# Patient Record
Sex: Male | Born: 1950
Health system: Southern US, Community
[De-identification: ages and names within clinical notes are randomized; demographics above are authoritative.]

## PROBLEM LIST (undated history)

## (undated) DIAGNOSIS — I4891 Unspecified atrial fibrillation: Secondary | ICD-10-CM

## (undated) DIAGNOSIS — N189 Chronic kidney disease, unspecified: Secondary | ICD-10-CM

## (undated) DIAGNOSIS — F32A Depression, unspecified: Secondary | ICD-10-CM

## (undated) DIAGNOSIS — C9590 Leukemia, unspecified not having achieved remission: Secondary | ICD-10-CM

## (undated) DIAGNOSIS — E039 Hypothyroidism, unspecified: Secondary | ICD-10-CM

## (undated) DIAGNOSIS — N400 Enlarged prostate without lower urinary tract symptoms: Secondary | ICD-10-CM

## (undated) DIAGNOSIS — I499 Cardiac arrhythmia, unspecified: Secondary | ICD-10-CM

## (undated) DIAGNOSIS — R0600 Dyspnea, unspecified: Secondary | ICD-10-CM

## (undated) DIAGNOSIS — Z5189 Encounter for other specified aftercare: Secondary | ICD-10-CM

## (undated) DIAGNOSIS — I219 Acute myocardial infarction, unspecified: Secondary | ICD-10-CM

## (undated) DIAGNOSIS — I10 Essential (primary) hypertension: Secondary | ICD-10-CM

## (undated) DIAGNOSIS — F419 Anxiety disorder, unspecified: Secondary | ICD-10-CM

## (undated) DIAGNOSIS — I509 Heart failure, unspecified: Secondary | ICD-10-CM

## (undated) DIAGNOSIS — I714 Abdominal aortic aneurysm, without rupture, unspecified: Secondary | ICD-10-CM

## (undated) DIAGNOSIS — I517 Cardiomegaly: Secondary | ICD-10-CM

## (undated) HISTORY — PX: KNEE SURGERY: SHX244

## (undated) HISTORY — DX: Hypothyroidism, unspecified: E03.9

## (undated) HISTORY — DX: Benign prostatic hyperplasia without lower urinary tract symptoms: N40.0

## (undated) HISTORY — DX: Abdominal aortic aneurysm, without rupture, unspecified: I71.40

## (undated) HISTORY — DX: Chronic kidney disease, unspecified: N18.9

## (undated) HISTORY — DX: Abdominal aortic aneurysm, without rupture: I71.4

## (undated) HISTORY — PX: APPENDECTOMY: SHX54

---

## 1998-02-24 ENCOUNTER — Encounter: Payer: Self-pay | Admitting: Internal Medicine

## 1998-02-24 ENCOUNTER — Emergency Department (HOSPITAL_COMMUNITY): Admission: EM | Admit: 1998-02-24 | Discharge: 1998-02-24 | Payer: Self-pay | Admitting: Emergency Medicine

## 2000-11-26 ENCOUNTER — Inpatient Hospital Stay (HOSPITAL_COMMUNITY): Admission: AD | Admit: 2000-11-26 | Discharge: 2000-11-29 | Payer: Self-pay | Admitting: Cardiology

## 2016-08-01 ENCOUNTER — Emergency Department (HOSPITAL_COMMUNITY)
Admission: EM | Admit: 2016-08-01 | Discharge: 2016-08-01 | Disposition: A | Discharge status: Home / Self Care | Payer: Self-pay | Attending: Emergency Medicine | Admitting: Emergency Medicine

## 2016-08-01 ENCOUNTER — Encounter (HOSPITAL_COMMUNITY): Payer: Self-pay

## 2016-08-01 DIAGNOSIS — Z7982 Long term (current) use of aspirin: Secondary | ICD-10-CM | POA: Insufficient documentation

## 2016-08-01 DIAGNOSIS — Z79899 Other long term (current) drug therapy: Secondary | ICD-10-CM | POA: Insufficient documentation

## 2016-08-01 DIAGNOSIS — I110 Hypertensive heart disease with heart failure: Secondary | ICD-10-CM | POA: Insufficient documentation

## 2016-08-01 DIAGNOSIS — R609 Edema, unspecified: Secondary | ICD-10-CM

## 2016-08-01 DIAGNOSIS — F17210 Nicotine dependence, cigarettes, uncomplicated: Secondary | ICD-10-CM | POA: Insufficient documentation

## 2016-08-01 DIAGNOSIS — I509 Heart failure, unspecified: Secondary | ICD-10-CM | POA: Insufficient documentation

## 2016-08-01 DIAGNOSIS — R600 Localized edema: Secondary | ICD-10-CM | POA: Insufficient documentation

## 2016-08-01 HISTORY — DX: Cardiomegaly: I51.7

## 2016-08-01 HISTORY — DX: Heart failure, unspecified: I50.9

## 2016-08-01 HISTORY — DX: Essential (primary) hypertension: I10

## 2016-08-01 LAB — I-STAT TROPONIN, ED: TROPONIN I, POC: 0.06 ng/mL (ref 0.00–0.08)

## 2016-08-01 LAB — BASIC METABOLIC PANEL
Anion gap: 9 (ref 5–15)
BUN: 33 mg/dL — ABNORMAL HIGH (ref 6–20)
CALCIUM: 9.1 mg/dL (ref 8.9–10.3)
CO2: 25 mmol/L (ref 22–32)
Chloride: 106 mmol/L (ref 101–111)
Creatinine, Ser: 1.33 mg/dL — ABNORMAL HIGH (ref 0.61–1.24)
GFR calc non Af Amer: 55 mL/min — ABNORMAL LOW (ref >60)
GLUCOSE: 133 mg/dL — AB (ref 65–99)
Potassium: 4.3 mmol/L (ref 3.5–5.1)
Sodium: 140 mmol/L (ref 135–145)

## 2016-08-01 LAB — CBC
HCT: 40.7 % (ref 39.0–52.0)
Hemoglobin: 12.3 g/dL — ABNORMAL LOW (ref 13.0–17.0)
MCH: 27.3 pg (ref 26.0–34.0)
MCHC: 30.2 g/dL (ref 30.0–36.0)
MCV: 90.2 fL (ref 78.0–100.0)
PLATELETS: 181 10*3/uL (ref 150–400)
RBC: 4.51 MIL/uL (ref 4.22–5.81)
RDW: 17 % — ABNORMAL HIGH (ref 11.5–15.5)
WBC: 12.3 10*3/uL — ABNORMAL HIGH (ref 4.0–10.5)

## 2016-08-01 NOTE — ED Notes (Signed)
Pt ambulated to bathroom 

## 2016-08-01 NOTE — ED Provider Notes (Signed)
[facility] DEPT Provider Note   CSN: [id number] Arrival date & time: 08/01/16  1347     History   Chief Complaint Chief Complaint  Patient presents with  . Leg Swelling    HPI John Hicks is a 66 y.o. male.  The history is provided by the patient.   CC: leg swelling  Onset/Duration: 1 week Timing: constant; worsening Location: both legs Quality: swelling Severity: moderate Modifying Factors:  Improved by: nothing  Worsened by: nothing Associated Signs/Symptoms:  Pertinent (+): nothing  Pertinent (-): chest pain, sob, redness, pain Context: h/o CHF. [facility] new [facility] for this, including Spironolactone.  Wanted to come get the medications checked out because he "doesn't trust the [place]."  Past Medical History:  Diagnosis Date  . CHF (congestive heart failure) ([place])   . Enlarged heart   . Hypertension     There are no active problems to display for this patient.   Past Surgical History:  Procedure Laterality Date  . APPENDECTOMY    . KNEE SURGERY         Home Medications    Prior to Admission medications   Medication Sig Start Date End Date Taking? Authorizing Provider  aspirin 81 MG chewable tablet Chew 81 mg by mouth daily.   Yes Historical Provider, MD  atorvastatin (LIPITOR) 80 MG tablet Take 80 mg by mouth daily.   Yes Historical Provider, MD  clopidogrel (PLAVIX) 75 MG tablet Take 75 mg by mouth daily.   Yes Historical Provider, MD  lisinopril (PRINIVIL,ZESTRIL) 40 MG tablet Take 20 mg by mouth daily.   Yes Historical Provider, MD  metoprolol (TOPROL-XL) 200 MG 24 hr tablet Take 200 mg by mouth daily.   Yes Historical Provider, MD  spironolactone (ALDACTONE) 25 MG tablet Take 12.5 mg by mouth daily.   Yes Historical Provider, MD  furosemide (LASIX) 40 MG tablet Take 40 mg by mouth daily.    Historical Provider, MD    Family History No family history on file.  Social History Social History  Substance Use  Topics  . Smoking status: Current Every Day Smoker    Packs/day: 0.10    Types: Cigarettes  . Smokeless tobacco: Never Used  . Alcohol use No     Allergies   Codeine   Review of Systems Review of Systems Ten systems are reviewed and are negative for acute change except as noted in the HPI   Physical Exam Updated Vital Signs BP 124/89   Pulse 102   Temp 98.5 F (36.9 C) (Oral)   Resp 17   Ht 6' (1.829 m)   Wt 250 lb (113.4 kg)   SpO2 99%   BMI 33.91 kg/m   Physical Exam  Constitutional: He is oriented to person, place, and time. He appears well-developed and well-nourished. No distress.  HENT:  Head: Normocephalic and atraumatic.  Nose: Nose normal.  Eyes: Conjunctivae and EOM are normal. Pupils are equal, round, and reactive to light. Right eye exhibits no discharge. Left eye exhibits no discharge. No scleral icterus.  Neck: Normal range of motion. Neck supple.  Cardiovascular: Normal rate and regular rhythm.  Exam reveals no gallop and no friction rub.   No murmur heard. Pulmonary/Chest: Effort normal and breath sounds normal. No stridor. No respiratory distress. He has no rales.  Abdominal: Soft. He exhibits no distension. There is no tenderness.  Musculoskeletal: He exhibits no edema or tenderness.  2+ BLE edema  Neurological: He is  alert and oriented to person, place, and time.  Skin: Skin is warm and dry. No rash noted. He is not diaphoretic. No erythema.  Psychiatric: He has a normal mood and affect.  Vitals reviewed.    ED Treatments / Results  Labs (all labs ordered are listed, but only abnormal results are displayed) Labs Reviewed  BASIC METABOLIC PANEL - Abnormal; Notable for the following:       Result Value   Glucose, Bld 133 (*)    BUN 33 (*)    Creatinine, Ser 1.33 (*)    GFR calc non Af Amer 55 (*)    All other components within normal limits  CBC - Abnormal; Notable for the following:    WBC 12.3 (*)    Hemoglobin 12.3 (*)    RDW 17.0  (*)    All other components within normal limits  I-STAT TROPOININ, ED    EKG  EKG Interpretation None       Radiology No results found.  Procedures Procedures (including critical care time)  Medications Ordered in ED Medications - No data to display   Initial Impression / Assessment and Plan / ED Course  I have reviewed the triage vital signs and the nursing notes.  Pertinent labs & imaging results that were available during my care of the patient were reviewed by me and considered in my medical decision making (see chart for details).     Peripheral edema in setting of known CHF recently started on Spironolactone which he has not taken yet. No chest pain or SOB or hypoxia that would require admission.  The patient is safe for discharge with strict return precautions.   Final Clinical Impressions(s) / ED Diagnoses   Final diagnoses:  Peripheral edema   Disposition: Discharge  Condition: Good  I have discussed the results, Dx and Tx plan with the patient who expressed understanding and agree(s) with the plan. Discharge instructions discussed at great length. The patient was given strict return precautions who verbalized understanding of the instructions. No further questions at time of discharge.    Current Discharge Medication List      Follow Up: Primary care provider  Schedule an appointment as soon as possible for a visit        Fatima Blank, MD 08/01/16 [phone]

## 2016-08-01 NOTE — ED Notes (Signed)
Pt stable, ambulatory, states understanding of discharge instructions 

## 2016-08-01 NOTE — ED Notes (Signed)
Pt stable, understands discharge instructions, and reasons for return.   

## 2016-08-01 NOTE — ED Triage Notes (Signed)
Per Pt, Pt is coming from home with complaints of bilateral leg swelling that started x 1 week. Pt was seen at the [facility] and given multiple medications, but he would like to have them reviewed and explained.

## 2016-08-12 ENCOUNTER — Inpatient Hospital Stay (HOSPITAL_COMMUNITY)
Admission: EM | Admit: 2016-08-12 | Discharge: 2016-08-23 | DRG: 291 | Disposition: A | Discharge status: Home Health | Payer: Self-pay | Attending: Oncology | Admitting: Oncology

## 2016-08-12 ENCOUNTER — Emergency Department (HOSPITAL_COMMUNITY): Payer: Self-pay

## 2016-08-12 ENCOUNTER — Encounter (HOSPITAL_COMMUNITY): Payer: Self-pay

## 2016-08-12 DIAGNOSIS — N189 Chronic kidney disease, unspecified: Secondary | ICD-10-CM | POA: Diagnosis present

## 2016-08-12 DIAGNOSIS — B372 Candidiasis of skin and nail: Secondary | ICD-10-CM | POA: Diagnosis present

## 2016-08-12 DIAGNOSIS — L304 Erythema intertrigo: Secondary | ICD-10-CM | POA: Diagnosis present

## 2016-08-12 DIAGNOSIS — I5043 Acute on chronic combined systolic (congestive) and diastolic (congestive) heart failure: Secondary | ICD-10-CM | POA: Diagnosis present

## 2016-08-12 DIAGNOSIS — Z1211 Encounter for screening for malignant neoplasm of colon: Secondary | ICD-10-CM

## 2016-08-12 DIAGNOSIS — Z79899 Other long term (current) drug therapy: Secondary | ICD-10-CM

## 2016-08-12 DIAGNOSIS — F419 Anxiety disorder, unspecified: Secondary | ICD-10-CM | POA: Diagnosis present

## 2016-08-12 DIAGNOSIS — N179 Acute kidney failure, unspecified: Secondary | ICD-10-CM | POA: Diagnosis present

## 2016-08-12 DIAGNOSIS — I483 Typical atrial flutter: Secondary | ICD-10-CM | POA: Diagnosis present

## 2016-08-12 DIAGNOSIS — Z7189 Other specified counseling: Secondary | ICD-10-CM

## 2016-08-12 DIAGNOSIS — I472 Ventricular tachycardia: Secondary | ICD-10-CM | POA: Diagnosis not present

## 2016-08-12 DIAGNOSIS — F17200 Nicotine dependence, unspecified, uncomplicated: Secondary | ICD-10-CM | POA: Diagnosis present

## 2016-08-12 DIAGNOSIS — B9620 Unspecified Escherichia coli [E. coli] as the cause of diseases classified elsewhere: Secondary | ICD-10-CM | POA: Diagnosis present

## 2016-08-12 DIAGNOSIS — I4510 Unspecified right bundle-branch block: Secondary | ICD-10-CM | POA: Diagnosis present

## 2016-08-12 DIAGNOSIS — Z7982 Long term (current) use of aspirin: Secondary | ICD-10-CM

## 2016-08-12 DIAGNOSIS — A0472 Enterocolitis due to Clostridium difficile, not specified as recurrent: Secondary | ICD-10-CM | POA: Diagnosis present

## 2016-08-12 DIAGNOSIS — N390 Urinary tract infection, site not specified: Secondary | ICD-10-CM | POA: Diagnosis present

## 2016-08-12 DIAGNOSIS — Z9114 Patient's other noncompliance with medication regimen: Secondary | ICD-10-CM

## 2016-08-12 DIAGNOSIS — I420 Dilated cardiomyopathy: Secondary | ICD-10-CM | POA: Diagnosis present

## 2016-08-12 DIAGNOSIS — I4729 Other ventricular tachycardia: Secondary | ICD-10-CM

## 2016-08-12 DIAGNOSIS — J449 Chronic obstructive pulmonary disease, unspecified: Secondary | ICD-10-CM | POA: Diagnosis present

## 2016-08-12 DIAGNOSIS — I428 Other cardiomyopathies: Secondary | ICD-10-CM | POA: Diagnosis present

## 2016-08-12 DIAGNOSIS — F17210 Nicotine dependence, cigarettes, uncomplicated: Secondary | ICD-10-CM | POA: Diagnosis present

## 2016-08-12 DIAGNOSIS — R570 Cardiogenic shock: Secondary | ICD-10-CM | POA: Diagnosis present

## 2016-08-12 DIAGNOSIS — I509 Heart failure, unspecified: Secondary | ICD-10-CM

## 2016-08-12 DIAGNOSIS — I130 Hypertensive heart and chronic kidney disease with heart failure and stage 1 through stage 4 chronic kidney disease, or unspecified chronic kidney disease: Principal | ICD-10-CM | POA: Diagnosis present

## 2016-08-12 DIAGNOSIS — Z515 Encounter for palliative care: Secondary | ICD-10-CM | POA: Diagnosis not present

## 2016-08-12 DIAGNOSIS — Q2543 Congenital aneurysm of aorta: Secondary | ICD-10-CM

## 2016-08-12 DIAGNOSIS — I480 Paroxysmal atrial fibrillation: Secondary | ICD-10-CM | POA: Diagnosis present

## 2016-08-12 DIAGNOSIS — I5082 Biventricular heart failure: Secondary | ICD-10-CM | POA: Diagnosis present

## 2016-08-12 DIAGNOSIS — R451 Restlessness and agitation: Secondary | ICD-10-CM | POA: Diagnosis not present

## 2016-08-12 DIAGNOSIS — R350 Frequency of micturition: Secondary | ICD-10-CM | POA: Diagnosis present

## 2016-08-12 DIAGNOSIS — I471 Supraventricular tachycardia: Secondary | ICD-10-CM | POA: Diagnosis present

## 2016-08-12 DIAGNOSIS — I10 Essential (primary) hypertension: Secondary | ICD-10-CM | POA: Diagnosis present

## 2016-08-12 DIAGNOSIS — Z8249 Family history of ischemic heart disease and other diseases of the circulatory system: Secondary | ICD-10-CM

## 2016-08-12 DIAGNOSIS — Z66 Do not resuscitate: Secondary | ICD-10-CM | POA: Diagnosis present

## 2016-08-12 DIAGNOSIS — I878 Other specified disorders of veins: Secondary | ICD-10-CM | POA: Diagnosis present

## 2016-08-12 LAB — CBC
HCT: 40.4 % (ref 39.0–52.0)
Hemoglobin: 12.8 g/dL — ABNORMAL LOW (ref 13.0–17.0)
MCH: 27.7 pg (ref 26.0–34.0)
MCHC: 31.7 g/dL (ref 30.0–36.0)
MCV: 87.4 fL (ref 78.0–100.0)
PLATELETS: 191 10*3/uL (ref 150–400)
RBC: 4.62 MIL/uL (ref 4.22–5.81)
RDW: 17.2 % — ABNORMAL HIGH (ref 11.5–15.5)
WBC: 9.5 10*3/uL (ref 4.0–10.5)

## 2016-08-12 LAB — BASIC METABOLIC PANEL
Anion gap: 9 (ref 5–15)
BUN: 24 mg/dL — ABNORMAL HIGH (ref 6–20)
CALCIUM: 9.2 mg/dL (ref 8.9–10.3)
CHLORIDE: 104 mmol/L (ref 101–111)
CO2: 23 mmol/L (ref 22–32)
CREATININE: 1.39 mg/dL — AB (ref 0.61–1.24)
GFR calc non Af Amer: 52 mL/min — ABNORMAL LOW (ref >60)
Glucose, Bld: 118 mg/dL — ABNORMAL HIGH (ref 65–99)
Potassium: 4.3 mmol/L (ref 3.5–5.1)
SODIUM: 136 mmol/L (ref 135–145)

## 2016-08-12 LAB — URINALYSIS, ROUTINE W REFLEX MICROSCOPIC
BILIRUBIN URINE: NEGATIVE
GLUCOSE, UA: NEGATIVE mg/dL
Ketones, ur: NEGATIVE mg/dL
Nitrite: POSITIVE — AB
PH: 5 (ref 5.0–8.0)
Protein, ur: 100 mg/dL — AB
Specific Gravity, Urine: 1.02 (ref 1.005–1.030)

## 2016-08-12 LAB — BRAIN NATRIURETIC PEPTIDE: B NATRIURETIC PEPTIDE 5: 2520 pg/mL — AB (ref 0.0–100.0)

## 2016-08-12 LAB — I-STAT TROPONIN, ED: TROPONIN I, POC: 0.06 ng/mL (ref 0.00–0.08)

## 2016-08-12 MED ORDER — FUROSEMIDE 10 MG/ML IJ SOLN
40.0000 mg | Freq: Once | INTRAMUSCULAR | Status: AC
  Administered 2016-08-12: 40 mg via INTRAVENOUS
  Filled 2016-08-12: qty 4

## 2016-08-12 MED ORDER — ONDANSETRON HCL 4 MG/2ML IJ SOLN
4.0000 mg | Freq: Four times a day (QID) | INTRAMUSCULAR | Status: DC | PRN
  Administered 2016-08-14: 4 mg via INTRAVENOUS
  Filled 2016-08-12: qty 2

## 2016-08-12 MED ORDER — SODIUM CHLORIDE 0.9% FLUSH: 3.0000 mL | INTRAVENOUS | Status: DC | PRN

## 2016-08-12 MED ORDER — SPIRONOLACTONE 25 MG PO TABS
12.5000 mg | ORAL_TABLET | Freq: Every day | ORAL | Status: DC
  Administered 2016-08-12 – 2016-08-16 (×5): 12.5 mg via ORAL
  Filled 2016-08-12 (×5): qty 1

## 2016-08-12 MED ORDER — ENOXAPARIN SODIUM 40 MG/0.4ML ~~LOC~~ SOLN
40.0000 mg | SUBCUTANEOUS | Status: DC
  Administered 2016-08-12 – 2016-08-13 (×2): 40 mg via SUBCUTANEOUS
  Filled 2016-08-12 (×2): qty 0.4

## 2016-08-12 MED ORDER — IPRATROPIUM-ALBUTEROL 0.5-2.5 (3) MG/3ML IN SOLN
3.0000 mL | Freq: Four times a day (QID) | RESPIRATORY_TRACT | Status: DC | PRN
Start: 2016-08-12 — End: 2016-08-23

## 2016-08-12 MED ORDER — SODIUM CHLORIDE 0.9% FLUSH
3.0000 mL | Freq: Two times a day (BID) | INTRAVENOUS | Status: DC
  Administered 2016-08-12 – 2016-08-23 (×18): 3 mL via INTRAVENOUS

## 2016-08-12 MED ORDER — DEXTROSE 5 % IV SOLN
1.0000 g | INTRAVENOUS | Status: DC
  Administered 2016-08-12 – 2016-08-14 (×3): 1 g via INTRAVENOUS
  Filled 2016-08-12 (×4): qty 10

## 2016-08-12 MED ORDER — SODIUM CHLORIDE 0.9 % IV SOLN: 250.0000 mL | INTRAVENOUS | Status: DC | PRN

## 2016-08-12 MED ORDER — FUROSEMIDE 10 MG/ML IJ SOLN
40.0000 mg | Freq: Every day | INTRAMUSCULAR | Status: DC
  Administered 2016-08-13: 40 mg via INTRAVENOUS
  Filled 2016-08-12: qty 4

## 2016-08-12 MED ORDER — ACETAMINOPHEN 325 MG PO TABS
650.0000 mg | ORAL_TABLET | ORAL | Status: DC | PRN
  Administered 2016-08-13 – 2016-08-14 (×2): 650 mg via ORAL
  Filled 2016-08-12 (×2): qty 2

## 2016-08-12 NOTE — ED Notes (Signed)
Report attempted 

## 2016-08-12 NOTE — ED Provider Notes (Signed)
[facility] DEPT Provider Note   CSN: [id number] Arrival date & time: 08/12/16  1343     History   Chief Complaint Chief Complaint  Patient presents with  . Leg Swelling    HPI John Hicks is a 66 y.o. male.  HPI  Patient presents with concern of bilateral leg swelling, dyspnea, worse with exertion. Patient acknowledges a history of congestive heart failure, gets his care weekly at the [place], though inconsistently, as he acknowledges some distrust [facility]. He notes over the past 3 weeks has had increasing bilateral lower extremity swelling, with new erythema over the past week, as well as with worsening dyspnea, with exertion. No focal chest pain, no chest pain with exertion. Patient takes spironolactone, as prescribed about one week ago, but has no Lasix. No concurrent fever, no syncope, no abdominal pain, no vomiting, no diarrhea.   Past Medical History:  Diagnosis Date  . CHF (congestive heart failure) ([place])   . Enlarged heart   . Hypertension     There are no active problems to display for this patient.   Past Surgical History:  Procedure Laterality Date  . APPENDECTOMY    . KNEE SURGERY         Home Medications    Prior to Admission medications   Medication Sig Start Date End Date Taking? Authorizing Provider  aspirin 81 MG chewable tablet Chew 81 mg by mouth daily.    Historical Provider, MD  atorvastatin (LIPITOR) 80 MG tablet Take 80 mg by mouth daily.    Historical Provider, MD  clopidogrel (PLAVIX) 75 MG tablet Take 75 mg by mouth daily.    Historical Provider, MD  furosemide (LASIX) 40 MG tablet Take 40 mg by mouth daily.    Historical Provider, MD  lisinopril (PRINIVIL,ZESTRIL) 40 MG tablet Take 20 mg by mouth daily.    Historical Provider, MD  metoprolol (TOPROL-XL) 200 MG 24 hr tablet Take 200 mg by mouth daily.    Historical Provider, MD  spironolactone (ALDACTONE) 25 MG tablet Take 12.5 mg by mouth daily.    Historical Provider, MD     Family History History reviewed. No pertinent family history.  Social History Social History  Substance Use Topics  . Smoking status: Current Every Day Smoker    Packs/day: 0.10    Types: Cigarettes  . Smokeless tobacco: Never Used  . Alcohol use No     Allergies   Codeine   Review of Systems Review of Systems  Constitutional:       Per HPI, otherwise negative  HENT:       Per HPI, otherwise negative  Respiratory:       Per HPI, otherwise negative  Cardiovascular:       Per HPI, otherwise negative  Gastrointestinal: Negative for vomiting.  Endocrine:       Negative aside from HPI  Genitourinary:       Neg aside from HPI   Musculoskeletal:       Per HPI, otherwise negative  Skin: Positive for color change.  Neurological: Positive for light-headedness. Negative for syncope.     Physical Exam Updated Vital Signs BP (!) 119/91   Pulse 94   Temp 97.3 F (36.3 C)   Resp 11   Ht 6' (1.829 m)   Wt 245 lb 4.8 oz (111.3 kg)   SpO2 100%   BMI 33.27 kg/m   Physical Exam  Constitutional: He is oriented to person, place, and time. He appears well-developed. No  distress.  HENT:  Head: Normocephalic and atraumatic.  Eyes: Conjunctivae and EOM are normal.  Cardiovascular: Normal rate and regular rhythm.   Pulmonary/Chest: No stridor. He has decreased breath sounds.  Abdominal: He exhibits no distension.  Musculoskeletal: He exhibits edema.  Distended bilateral lower extremities inferior to the knee, symmetrically, with erythematous, weeping skin  Neurological: He is alert and oriented to person, place, and time.  Skin: Skin is warm and dry.  Psychiatric: He has a normal mood and affect.  Nursing note and vitals reviewed.    ED Treatments / Results  Labs (all labs ordered are listed, but only abnormal results are displayed) Labs Reviewed  BASIC METABOLIC PANEL - Abnormal; Notable for the following:       Result Value   Glucose, Bld 118 (*)    BUN 24  (*)    Creatinine, Ser 1.39 (*)    GFR calc non Af Amer 52 (*)    All other components within normal limits  CBC - Abnormal; Notable for the following:    Hemoglobin 12.8 (*)    RDW 17.2 (*)    All other components within normal limits  BRAIN NATRIURETIC PEPTIDE - Abnormal; Notable for the following:    B Natriuretic Peptide 2,520.0 (*)    All other components within normal limits  URINALYSIS, ROUTINE W REFLEX MICROSCOPIC  I-STAT TROPOININ, ED    EKG  EKG Interpretation  Date/Time:  Saturday August 12 2016 14:17:38 EDT Ventricular Rate:  95 PR Interval:  198 QRS Duration: 180 QT Interval:  522 QTC Calculation: 655 R Axis:   -61 Text Interpretation:  Sinus rhythm with occasional Premature ventricular complexes Left axis deviation Right bundle branch block T wave abnormality No significant change since last tracing Abnormal ekg Confirmed by Carmin Muskrat  MD [phone]) on 08/12/2016 4:27:16 PM       Radiology Dg Chest 2 View  Result Date: 08/12/2016 CLINICAL DATA:  Patient with shortness of breath for multiple weeks. Lower extremity edema. EXAM: CHEST  2 VIEW COMPARISON:  None. FINDINGS: Marked cardiomegaly. Patchy opacities within the right mid and lower lung. Small right pleural effusion. No pneumothorax. Thoracic spine degenerative changes. IMPRESSION: Patchy heterogeneous opacities within the right mid and lower lung concerning for pneumonia in the appropriate clinical setting. Followup PA and lateral chest X-ray is recommended in 3-4 weeks following trial of antibiotic therapy to ensure resolution and exclude underlying malignancy. Small right pleural effusion. Marked cardiomegaly. A component of pericardial effusion is not excluded. Electronically Signed   By: [name] M.D.   On: 08/12/2016 14:59    Procedures Procedures (including critical care time)  Medications Ordered in ED Medications  furosemide (LASIX) injection 40 mg (not administered)     Initial Impression /  Assessment and Plan / ED Course  I have reviewed the triage vital signs and the nursing notes.  Pertinent labs & imaging results that were available during my care of the patient were reviewed by me and considered in my medical decision making (see chart for details).  Chart review notable for visit one week ago with similar circumstances, but now with worsening edema, compared to physical exam notes from that encounter.   Labs notable for BNP greater than 2500. Creatinine greater than 1.3. Patient will receive IV Lasix, 40 mg. Given the patient's persistent dyspnea, dyspnea on exertion, and substantial lower extremity edema, as well as abnormal chest x-ray, patient will require admission for further evaluation and management.  Final Clinical Impressions(s) /  ED Diagnoses  Acute exacerbation of congestive heart failure   Carmin Muskrat, MD 08/12/16 1728

## 2016-08-12 NOTE — Progress Notes (Signed)
Patient arrived in the unit accompanied by NT via stretcher. Orientation to the unit given. Patient verbalizes understanding. 

## 2016-08-12 NOTE — ED Notes (Signed)
Pt unable to give urine @ this time. Pt used restroom before being roomed.

## 2016-08-12 NOTE — ED Triage Notes (Signed)
Onset 3 weeks bilateral leg swelling.  Swelling is worsening, legs leaking fluid.  Edema up to hips per pt.  No chest pain.  Mild shortness of breath, NAD in triage.

## 2016-08-12 NOTE — H&P (Signed)
Date: 08/12/2016               Patient Name:  John Hicks MRN: [id number]  DOB: 02-Oct-1950 Age / Sex: 66 y.o., male   PCP: [facility]         Medical Service: Internal Medicine Teaching Service         Attending Physician: Dr. Sid Falcon, MD    First Contact: Dr. Einar Gip Pager: [phone]  Second Contact: Dr. Ignacia Marvel Pager: [phone]       After Hours (After 5p/  First Contact Pager: [phone]  weekends / holidays): Second Contact Pager: [phone]   Chief Complaint: leg swelling  History of Present Illness:  Mr. Nkosi Cortright is a 66 year old male with medical history significant for CHF, HTN, COPD with an 61 pack-year history who presents for evaluation of lower extremity swelling. Reports that about 3 weeks ago his legs started to swell and now are to the point where his legs are weeping and also developed scrotal edema. He was seen in the ED 10 days ago at which time he was noted to be volume overloaded and was discharged home assuming he would take his medications. He is prescribed lasix 40 mg at home and was recently started on Spironolactone. Last CHF exacerbation was around [place]. At this time he had a cath and echo however does not recall the findings however was told his vessels had not changed since his prior caths. He reports having at least 4 caths and several echos since he was diagnosed with HF in the late 90's. He denies any chest pain, worsening SOB, fevers, chills, recent illness nor abdominal pain.   In the ED, temp was 97.3*, pulse 98, BP 126/94, respirations 17 and he was saturating 100% on RA. CXR with small right pleural effusion and marked cardiomegaly. Patchy heterogenous opacities were appreciated BL. EKG shows sinus rhythm with rate of 95, RBB and borderline QT prolongation, unchanged from prior EKG 08/02/16. BMET with Cr of 1.4. BNP 2500. He was subsequently given IV lasix 40 mg and admitted for management of acute on chronic CHF.   Meds:  Current  Meds  Medication Sig  . ALPRAZolam (XANAX) 1 MG tablet Take 0.5 mg by mouth daily.  Marland Kitchen aspirin 81 MG chewable tablet Chew 81 mg by mouth daily.  Marland Kitchen lisinopril (PRINIVIL,ZESTRIL) 40 MG tablet Take 20 mg by mouth daily.  Marland Kitchen OVER THE COUNTER MEDICATION Place into both nostrils See admin instructions. Over the counter nasal decongestant - use daily as needed for runny nose  . spironolactone (ALDACTONE) 25 MG tablet Take 12.5 mg by mouth daily.   Allergies: Allergies as of 08/12/2016 - Review Complete 08/12/2016  Allergen Reaction Noted  . Codeine Other (See Comments) 08/01/2016   Past Medical History:  Diagnosis Date  . CHF (congestive heart failure) ([place])   . Enlarged heart   . Hypertension    Family History: Nonspecific distant family history of heart disease and DM.   Social History: Former smoker with an 44 pack-year history, recently quit although friend at bedside indicates otherwise. Drinks socially and smokes marijuana often. Lives at home with a roommate.   Review of Systems: He does report noticing strings of mucous, occasionally dark colored, for the past several months. He denies any dysuria or burning. Otherwise a complete ROS was negative except as per HPI.   Physical Exam: Blood pressure (!) 119/91, pulse 94, temperature 97.3 F (36.3 C), resp. rate  11, height 6' (1.829 m), weight 245 lb 4.8 oz (111.3 kg), SpO2 100 %. General: Chronically-ill appearing caucasian male, resting in bed. In no acute distress. Friend present at bedside. HENT: EOMI. No conjunctival injection, icterus or ptosis. Oropharynx clear, mucous membranes moist.  Cardiovascular: Regular rate and rhythm, S3 gallop appreciated. No murmur or rub appreciated. Pulmonary: Right basilar crackles. Faint scattered exp wheezing. Unlabored breathing.  Abdomen: Soft, non-tender and non-distended. No guarding or rigidity. +bowel sounds.  Extremities: 2+ pitting edema of BL LE up to hips. Feet cold and edematous.  Skin:  Scrotal edema. Weeping wounds of BL LE without purulence or foul odor. BL LE erythema however without warmth.  Neuro: Strength and sensation grossly intact.  Psych: Mood normal and affect was mood congruent. Responds to questions appropriately. Poor historian.   EKG: Sinus rhythm with rate of 95. Occasional PVC. LAD with RBBB. Borderline QT prolongation.  CXR: Patchy heterogeneous opacities within the right mid and lower lung as well as small right pleural effusion. Cardiomegaly.   Urinalysis    Component Value Date/Time   COLORURINE AMBER (A) 08/12/2016 1833   APPEARANCEUR HAZY (A) 08/12/2016 1833   LABSPEC 1.020 08/12/2016 1833   PHURINE 5.0 08/12/2016 1833   GLUCOSEU NEGATIVE 08/12/2016 1833   HGBUR MODERATE (A) 08/12/2016 1833   BILIRUBINUR NEGATIVE 08/12/2016 1833   KETONESUR NEGATIVE 08/12/2016 1833   PROTEINUR 100 (A) 08/12/2016 1833   NITRITE POSITIVE (A) 08/12/2016 1833   LEUKOCYTESUR LARGE (A) 08/12/2016 1833   Assessment & Plan by Problem: This is a 66 y/o F admitted with AonC CHF exacerbation in setting of several weeks of medication non-compliance. Also with UTI.  Principal Problem:   Acute exacerbation of congestive heart failure (HCC) Active Problems:   Tobacco use disorder   Essential hypertension   Urinary tract infection  Acute on Chronic Congestive Heart Failure: BNP 2500 and he is volume-up on exam in the setting of several weeks of lasix non-compliance. No ECHO or cath available in EMR and patient unaware of those results. He is a [place] patient and will try to obtain records as he recently had an ECO + cath 04/2016. He is prescribed lasix 40 mg daily however he has been non-compliant with this for several weeks. Unclear dry weight and ED weight(s) today are inconsistent (?initially 260 --> 246 --> 240). Lungs are clear except for small area of right basilar crackles and he does not have an oxygen requirement.   -IV Lasix 40 mg BID - will evaluate response and adjust  accordingly on AM rounds. -Daily weights -Strict I&O's -Obtain records from [facility] for LE wounds -BMET and CBC in am  Urinary Tract Infection: UA with large leukocytes, positive nitrates and TNTC WBC. Pt reports stringy dark colored mucous with urination for several weeks. No back pain. No CVA tenderness. No leukocytosis.  -Urine culture -IV Ceftriaxone  HTN: On lisinopril 20 mg at home and reports compliance with this. This will be held on admission in the setting of diuresis, unknown Cr baseline and he was normotensive on admission. -Hold lisinopril, can resume if HTN returns and his renal fxn is stable  COPD, History of Tobacco Abuse: With an 84 pack year history. Recently quit smoking 2 pks/day x 42 years. Not on home oxygen nor inhalers. Some faint exp wheezes on examination however he does not have dyspnea or an oxygen requirement. -Duonebs PRN   Anxiety: Reports hx of anxiety for which he takes Xanax PRN and often finds  great improvement when he talks to his friend during these episodes.  -Xanax held on admission however can be resumed if pt were to develop anxiety.  Diet: HH, fluid restriction <1200 mL IVF: NONE Code status: FULL - this was discussed with the patient at bedside upon admission DVT Prophylaxis: Lovenox  Dispo: Admit patient to Inpatient with expected length of stay greater than 2 midnights.  SignedEinar Gip, DO 08/12/2016, 5:42 PM  Pager: [phone]

## 2016-08-13 ENCOUNTER — Inpatient Hospital Stay (HOSPITAL_COMMUNITY): Payer: Self-pay

## 2016-08-13 DIAGNOSIS — I5023 Acute on chronic systolic (congestive) heart failure: Secondary | ICD-10-CM | POA: Diagnosis not present

## 2016-08-13 DIAGNOSIS — I351 Nonrheumatic aortic (valve) insufficiency: Secondary | ICD-10-CM

## 2016-08-13 LAB — BASIC METABOLIC PANEL
ANION GAP: 11 (ref 5–15)
BUN: 27 mg/dL — ABNORMAL HIGH (ref 6–20)
CHLORIDE: 105 mmol/L (ref 101–111)
CO2: 21 mmol/L — ABNORMAL LOW (ref 22–32)
Calcium: 8.8 mg/dL — ABNORMAL LOW (ref 8.9–10.3)
Creatinine, Ser: 1.55 mg/dL — ABNORMAL HIGH (ref 0.61–1.24)
GFR calc Af Amer: 53 mL/min — ABNORMAL LOW (ref >60)
GFR, EST NON AFRICAN AMERICAN: 46 mL/min — AB (ref >60)
GLUCOSE: 94 mg/dL (ref 65–99)
POTASSIUM: 3.7 mmol/L (ref 3.5–5.1)
Sodium: 137 mmol/L (ref 135–145)

## 2016-08-13 LAB — HIV ANTIBODY (ROUTINE TESTING W REFLEX): HIV SCREEN 4TH GENERATION: NONREACTIVE

## 2016-08-13 LAB — ECHOCARDIOGRAM COMPLETE
HEIGHTINCHES: 72 in
Weight: 3790.4 oz

## 2016-08-13 LAB — TSH: TSH: 2.832 u[IU]/mL (ref 0.350–4.500)

## 2016-08-13 MED ORDER — METOPROLOL TARTRATE 5 MG/5ML IV SOLN
5.0000 mg | Freq: Once | INTRAVENOUS | Status: AC
  Administered 2016-08-13: 5 mg via INTRAVENOUS
  Filled 2016-08-13: qty 5

## 2016-08-13 MED ORDER — NYSTATIN 100000 UNIT/GM EX POWD
Freq: Three times a day (TID) | CUTANEOUS | Status: DC
  Administered 2016-08-13 – 2016-08-23 (×25): via TOPICAL
  Filled 2016-08-13 (×3): qty 15

## 2016-08-13 MED ORDER — KETOROLAC TROMETHAMINE 15 MG/ML IJ SOLN
15.0000 mg | Freq: Once | INTRAMUSCULAR | Status: AC
  Administered 2016-08-13: 15 mg via INTRAVENOUS
  Filled 2016-08-13: qty 1

## 2016-08-13 MED ORDER — ALPRAZOLAM 0.5 MG PO TABS
0.5000 mg | ORAL_TABLET | Freq: Two times a day (BID) | ORAL | Status: DC | PRN
  Administered 2016-08-13 – 2016-08-22 (×19): 0.5 mg via ORAL
  Filled 2016-08-13 (×20): qty 1

## 2016-08-13 MED ORDER — CARVEDILOL 3.125 MG PO TABS
3.1250 mg | ORAL_TABLET | Freq: Two times a day (BID) | ORAL | Status: DC
  Administered 2016-08-13 – 2016-08-14 (×2): 3.125 mg via ORAL
  Filled 2016-08-13 (×2): qty 1

## 2016-08-13 NOTE — Progress Notes (Signed)
CCMD alerted RN that PT had 12 beat run of VT. MDs were actually outside of room, PT was receiving echo at the time. He is asymptomatic. MD stated she looked at strip and believes his QRS is wide which is causing the untrue VT. MD states that cardiology has been consulted.

## 2016-08-13 NOTE — Progress Notes (Signed)
Pt takes Xanax for anxiety at home 1mg , may need this ordered during admission, Thanks Arvella Nigh RN.

## 2016-08-13 NOTE — Progress Notes (Signed)
Pt had a 17 beat run of V tach, no s/s MD notified will continue to [employer] F RN

## 2016-08-13 NOTE — Progress Notes (Signed)
On-call MD aware of pain will come to assess patient and poss order analgesic

## 2016-08-13 NOTE — Consult Note (Signed)
Primary cardiologist: [facility] Consulting cardiologist: Dr Carlyle Dolly Requesting physician: Dr Daryll Drown Indication: CHF  Clinical Summary John Hicks is a 66 y.o.male history of CHF unknown type, HTN, copd admitted with LE edema and scrotal edema. He is followed through the [place] for his heart failure. He has apparently had echos and caths done through the [place], he is unaware of the details. From notes he has poor compliance with home medications. Notes indicate side effects on beta blockers in the past, he stopped taking. He states he was on 200mg  of metoprolol at the time.  Presents with several week history of progressing LE edema, legs are now weaping with fluid. Increased SOB. Occasional palpitations at times.    Echo LVEF 10-15%, diffuse hypokinesis, abnormal diastolic function, aortic root 4.8 cm. Mild RV dysfunction   K 4.3 Cr 1.39 Hgb 12.8 Plt 191  BNP 2520 Trop neg x 1 CXR: possible pericardial effusion EKG SR, RBBB    Allergies  Allergen Reactions  . Codeine Other (See Comments)    agitation    Medications Scheduled Medications: . cefTRIAXone (ROCEPHIN)  IV  1 g Intravenous Q24H  . enoxaparin (LOVENOX) injection  40 mg Subcutaneous Q24H  . furosemide  40 mg Intravenous Daily  . nystatin   Topical TID  . sodium chloride flush  3 mL Intravenous Q12H  . spironolactone  12.5 mg Oral Daily     Infusions:   PRN Medications:  sodium chloride, acetaminophen, ALPRAZolam, ipratropium-albuterol, ondansetron (ZOFRAN) IV, sodium chloride flush   Past Medical History:  Diagnosis Date  . CHF (congestive heart failure) ([place])   . Enlarged heart   . Hypertension     Past Surgical History:  Procedure Laterality Date  . APPENDECTOMY    . KNEE SURGERY      History reviewed. No pertinent family history.  Social History Mr. Crotteau reports that he has been smoking Cigarettes.  He has been smoking about 0.10 packs per day. He has never used smokeless tobacco. Mr.  Slinger reports that he does not drink alcohol.  Review of Systems CONSTITUTIONAL: No weight loss, fever, chills, weakness or fatigue.  HEENT: Eyes: No visual loss, blurred vision, double vision or yellow sclerae. No hearing loss, sneezing, congestion, runny nose or sore throat.  SKIN: No rash or itching.  CARDIOVASCULAR: per HPI RESPIRATORY: per HPI GASTROINTESTINAL: No anorexia, nausea, vomiting or diarrhea. No abdominal pain or blood.  GENITOURINARY: no polyuria, no dysuria NEUROLOGICAL: No headache, dizziness, syncope, paralysis, ataxia, numbness or tingling in the extremities. No change in bowel or bladder control.  MUSCULOSKELETAL: per HPI HEMATOLOGIC: No anemia, bleeding or bruising.  LYMPHATICS: No enlarged nodes. No history of splenectomy.  PSYCHIATRIC: No history of depression or anxiety.      Physical Examination Blood pressure (!) 129/97, pulse 97, temperature 97.8 F (36.6 C), temperature source Oral, resp. rate 18, height 6' (1.829 m), weight 236 lb 14.4 oz (107.5 kg), SpO2 99 %.  Intake/Output Summary (Last 24 hours) at 08/13/16 1350 Last data filed at 08/13/16 0900  Gross per 24 hour  Intake              390 ml  Output             1876 ml  Net            -1486 ml    HEENT: sclera clear, throat clear  Cardiovascular: Regular, tachy 150, no m/r/g. JVD to angle of jaw  Respiratory: crackles bilateral bases  GI: abdomen soft, NT, ND  MSK 3+ bilateral edema, weeping  Neuro: no focal deficits  Psych: appropriate affect   Lab Results  Basic Metabolic Panel:  Recent Labs Lab 08/12/16 1420 08/13/16 0435  NA 136 137  K 4.3 3.7  CL 104 105  CO2 23 21*  GLUCOSE 118* 94  BUN 24* 27*  CREATININE 1.39* 1.55*  CALCIUM 9.2 8.8*    Liver Function Tests: No results for input(s): AST, ALT, ALKPHOS, BILITOT, PROT, ALBUMIN in the last 168 hours.  CBC:  Recent Labs Lab 08/12/16 1420  WBC 9.5  HGB 12.8*  HCT 40.4  MCV 87.4  PLT 191    Cardiac  Enzymes: No results for input(s): CKTOTAL, CKMB, CKMBINDEX, TROPONINI in the last 168 hours.  BNP: Invalid input(s): POCBNP      Impression/Recommendations 1. Acute on chronic systolic HF - followed at the [place], his CHF history is unknown regarding the previous severity and any prior workup including ischemic testing. He thinks he had a cath at [facility] this year. He knows he has history of CHF but no other details.  - echo this admit with LVEF 10-15%.  - current medical therapy with aldactone 12.5mg  daily. We will start coreg 3.125mg  bid in setting of systolic dysfunction and tachycardia.  - negative 1.4 liters yesterday. He received lasix IV 40mg  x 1 yesterday, has received a 40mg  dose today. F/u I/Os today along with renal function, repeat dosing depending on AM evaluation. Likely will require several days of diuresis  2. Tachcyardia - difficult to discern rhythm by telemetry , regular tachycardia with his undelrying RBBB at 150. Aflutter vs PSVT. We will obtain 12 lead - will give IV lopressor after initial 12 lead obtained to see if can break rhythm, starting oral beta blocker as well may be enough to keep him out of it. If not may require amiodarone.  - if convincing to be flutter will need to start anticoag.  3. Aortic root aneurysm - measures 4.8 cm by echo - will need to be monitored over time   I have placed order to request records from [place], M.D.
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
## 2016-08-13 NOTE — Progress Notes (Signed)
Pt HR went to 140-150's after getting up, MD notified and aware, will continue to monitor, Thanks Arvella Nigh RN

## 2016-08-13 NOTE — Progress Notes (Signed)
   Subjective: John Hicks was seen and evaluated today at bedside. Reports an uneventful evening other than anxiety this morning which was relieved with home PRN Xanax. Reports breathing improved today however did not complain of worsening dyspnea on admission.   Objective:  Vital signs in last 24 hours: Vitals:   08/12/16 1845 08/12/16 2000 08/12/16 2350 08/13/16 0600  BP: 118/88 (!) 133/96 (!) 133/91 (!) 129/100  Pulse: (!) 9 96 92 97  Resp: 16 18 20 20   Temp:  98 F (36.7 C) 97.7 F (36.5 C) 97.9 F (36.6 C)  TempSrc: Oral Oral Oral Oral  SpO2: 98% 99% 99% 99%  Weight: 240 lb 3.2 oz (109 kg)   236 lb 14.4 oz (107.5 kg)  Height: 6' (1.829 m)      General: Chronically-ill appearing caucasian man sleeping in bed. In no acute distress. Fatigued upon awakening. HENT: [place], AT. No conjunctival injection, icterus or ptosis.  Cardiovascular: Tachycardic, rate high 90's. Frequent PVCs. No rub appreciated. Pulmonary: Faint crackles right base with scattered wheezes. Can only speak in short 3-4 word sentences.  Abdomen: Soft. +bowel sounds.  Extremities: 2-3+ pitting edema up to sacrum. BL LE erythema slightly improved however LE continue to weep. Feet cold.  Psych: Mood normal and affect was mood congruent.  Assessment/Plan:  Principal Problem:   Acute exacerbation of congestive heart failure (HCC) Active Problems:   Tobacco use disorder   Essential hypertension   Urinary tract infection  Acute on Chronic Congestive Heart Failure: Again, without records from [place] indicating systolic vs diastolic vs combined. Clearly volume-up on examination however lungs clear (except for small area of right lower crackles). Currently saturating well on RA. Weight on admission 240 lbs, now down 4 lbs at 236, s/p 1 dose of IV Lasix 40. Has another dose scheduled for 10 am today. Will continue diuresis and monitor volume status closely.  -IV Lasix 40 mg daily  -Daily weights, I&O, fluid  restriction -Continue to obtain outside records.  -ECHO pending    Urinary tract infection: Afebrile overnight. On IV Ceftriaxone 1 g daily. Pt reports significant scrotal edema and subsequent difficulty with urination recently. Pt denied mentioning to me that he had mucous in urine this morning. Denied dysuria. Pt is poor historian. -Continue Ceftriaxone -urine culture pending  CKD vs AKI?: Again, no clear baseline due to lack of records however 12 days ago his Cr was 1.3. Cr 1.4 on admission, 1.55 today. Will monitor renal fxn closely in setting of diuresis -BMET in AM  Supraventricular tachycardia, frequent PVCs: wide complexes with apparent RBBB. Doesn't appear to be having true runs of sustained ventricular tachycardia. He has been having periods of tachycardia reaching up to the 140's however are followed by periods of normal rate however with frequent PVCs.  -Telemetry -Consider consulting cardiology if pt were to develop HD instability, sustained V-tach or CP.  -Low threshold to consult   Anxiety: Restarted home Xanax 0.5 mg BID PRN  Essential HTN: On lisinopril 20 mg at home. This was held on admission due to aggressive diuresis with IV lasix + ?AKI. Currently with some diastolic elevation however will continue to hold presently.    Dispo: Anticipated discharge in approximately 3 day(s).   John Thier, DO 08/13/2016, 6:43 AM Pager: [phone]

## 2016-08-13 NOTE — Progress Notes (Signed)
Pt is alert and oriented with pain in  Groining and loer extremities with redness, and foul odor , paged Md for analgesic, Cleaned groining and applied dry chucks to Lower legs for weeping plan to consult to obtain an order for leg wrapping. Treatment continues for IV lasix and Antibiotics.

## 2016-08-13 NOTE — Progress Notes (Signed)
Md ordered a 1 time Toradol to give after tylenol .

## 2016-08-13 NOTE — Progress Notes (Signed)
Completed 12 lead EKG and 3 page rhythm strip per MD order. Placed in chart.

## 2016-08-13 NOTE — Progress Notes (Signed)
Pt HR has been ST in 140's since beginning of 0700 shift. MD aware. Pt had multiple runs of VT last night, MD also aware of this. Pt HR now in 80's, and PT had another 5 beat run of VT. Pt asymptomatic, was sleeping at the time. MD aware.

## 2016-08-13 NOTE — Progress Notes (Signed)
Pt had a 12 beat run of V-tach non sustained, no S/S,  MD notified and aware, ordered ECG which similar to previous ECG's, will continue to monitor, Thanks Buckner Malta.

## 2016-08-14 DIAGNOSIS — N179 Acute kidney failure, unspecified: Secondary | ICD-10-CM | POA: Diagnosis not present

## 2016-08-14 DIAGNOSIS — I5023 Acute on chronic systolic (congestive) heart failure: Secondary | ICD-10-CM

## 2016-08-14 DIAGNOSIS — R570 Cardiogenic shock: Secondary | ICD-10-CM | POA: Diagnosis not present

## 2016-08-14 DIAGNOSIS — I483 Typical atrial flutter: Secondary | ICD-10-CM

## 2016-08-14 DIAGNOSIS — B9620 Unspecified Escherichia coli [E. coli] as the cause of diseases classified elsewhere: Secondary | ICD-10-CM

## 2016-08-14 DIAGNOSIS — N390 Urinary tract infection, site not specified: Secondary | ICD-10-CM

## 2016-08-14 DIAGNOSIS — I5043 Acute on chronic combined systolic (congestive) and diastolic (congestive) heart failure: Secondary | ICD-10-CM | POA: Diagnosis not present

## 2016-08-14 LAB — BASIC METABOLIC PANEL
Anion gap: 12 (ref 5–15)
BUN: 34 mg/dL — AB (ref 6–20)
CO2: 21 mmol/L — AB (ref 22–32)
Calcium: 8.8 mg/dL — ABNORMAL LOW (ref 8.9–10.3)
Chloride: 103 mmol/L (ref 101–111)
Creatinine, Ser: 1.69 mg/dL — ABNORMAL HIGH (ref 0.61–1.24)
GFR calc Af Amer: 48 mL/min — ABNORMAL LOW (ref >60)
GFR, EST NON AFRICAN AMERICAN: 41 mL/min — AB (ref >60)
GLUCOSE: 136 mg/dL — AB (ref 65–99)
POTASSIUM: 3.8 mmol/L (ref 3.5–5.1)
Sodium: 136 mmol/L (ref 135–145)

## 2016-08-14 LAB — GLUCOSE, CAPILLARY: GLUCOSE-CAPILLARY: 166 mg/dL — AB (ref 65–99)

## 2016-08-14 LAB — COOXEMETRY PANEL
Carboxyhemoglobin: 0.7 % (ref 0.5–1.5)
Methemoglobin: 0.9 % (ref 0.0–1.5)
O2 SAT: 25.1 %
Total hemoglobin: 12.5 g/dL (ref 12.0–16.0)

## 2016-08-14 LAB — HEPARIN LEVEL (UNFRACTIONATED): Heparin Unfractionated: 0.12 IU/mL — ABNORMAL LOW (ref 0.30–0.70)

## 2016-08-14 MED ORDER — METOPROLOL TARTRATE 5 MG/5ML IV SOLN
5.0000 mg | Freq: Once | INTRAVENOUS | Status: AC
  Administered 2016-08-14: 5 mg via INTRAVENOUS
  Filled 2016-08-14: qty 5

## 2016-08-14 MED ORDER — POTASSIUM CHLORIDE CRYS ER 20 MEQ PO TBCR
20.0000 meq | EXTENDED_RELEASE_TABLET | Freq: Two times a day (BID) | ORAL | Status: DC
  Administered 2016-08-14 (×2): 20 meq via ORAL
  Filled 2016-08-14 (×2): qty 1

## 2016-08-14 MED ORDER — HEPARIN BOLUS VIA INFUSION
4000.0000 [IU] | Freq: Once | INTRAVENOUS | Status: AC
  Administered 2016-08-14: 4000 [IU] via INTRAVENOUS
  Filled 2016-08-14: qty 4000

## 2016-08-14 MED ORDER — FUROSEMIDE 10 MG/ML IJ SOLN
80.0000 mg | Freq: Three times a day (TID) | INTRAMUSCULAR | Status: DC
  Administered 2016-08-14 – 2016-08-18 (×12): 80 mg via INTRAVENOUS
  Filled 2016-08-14 (×12): qty 8

## 2016-08-14 MED ORDER — SODIUM CHLORIDE 0.9% FLUSH
10.0000 mL | Freq: Two times a day (BID) | INTRAVENOUS | Status: DC
  Administered 2016-08-14 – 2016-08-18 (×8): 10 mL
  Administered 2016-08-19 – 2016-08-20 (×2): 20 mL
  Administered 2016-08-21 – 2016-08-23 (×5): 10 mL

## 2016-08-14 MED ORDER — AMIODARONE IV BOLUS ONLY 150 MG/100ML
150.0000 mg | Freq: Once | INTRAVENOUS | Status: DC
  Filled 2016-08-14: qty 100

## 2016-08-14 MED ORDER — HEPARIN BOLUS VIA INFUSION
3000.0000 [IU] | Freq: Once | INTRAVENOUS | Status: AC
  Administered 2016-08-14: 3000 [IU] via INTRAVENOUS
  Filled 2016-08-14: qty 3000

## 2016-08-14 MED ORDER — SODIUM CHLORIDE 0.9% FLUSH: 10.0000 mL | INTRAVENOUS | Status: DC | PRN

## 2016-08-14 MED ORDER — MILRINONE LACTATE IN DEXTROSE 20-5 MG/100ML-% IV SOLN
0.2500 ug/kg/min | INTRAVENOUS | Status: DC
  Administered 2016-08-14 – 2016-08-15 (×2): 0.375 ug/kg/min via INTRAVENOUS
  Administered 2016-08-16 – 2016-08-17 (×2): 0.25 ug/kg/min via INTRAVENOUS
  Administered 2016-08-17 – 2016-08-18 (×4): 0.375 ug/kg/min via INTRAVENOUS
  Administered 2016-08-19: 0.25 ug/kg/min via INTRAVENOUS
  Administered 2016-08-19: 0.375 ug/kg/min via INTRAVENOUS
  Administered 2016-08-20 – 2016-08-21 (×3): 0.25 ug/kg/min via INTRAVENOUS
  Filled 2016-08-14 (×18): qty 100

## 2016-08-14 MED ORDER — AMIODARONE HCL IN DEXTROSE 360-4.14 MG/200ML-% IV SOLN
60.0000 mg/h | INTRAVENOUS | Status: AC
  Administered 2016-08-14 (×2): 60 mg/h via INTRAVENOUS
  Filled 2016-08-14 (×2): qty 200

## 2016-08-14 MED ORDER — AMIODARONE HCL IN DEXTROSE 360-4.14 MG/200ML-% IV SOLN
30.0000 mg/h | INTRAVENOUS | Status: DC
  Administered 2016-08-14 – 2016-08-18 (×5): 30 mg/h via INTRAVENOUS
  Filled 2016-08-14 (×9): qty 200

## 2016-08-14 MED ORDER — AMIODARONE LOAD VIA INFUSION
150.0000 mg | Freq: Once | INTRAVENOUS | Status: DC
  Administered 2016-08-14: 150 mg via INTRAVENOUS
  Filled 2016-08-14: qty 83.34

## 2016-08-14 MED ORDER — AMIODARONE LOAD VIA INFUSION
150.0000 mg | Freq: Once | INTRAVENOUS | Status: DC
  Filled 2016-08-14: qty 83.34

## 2016-08-14 MED ORDER — FUROSEMIDE 10 MG/ML IJ SOLN
60.0000 mg | Freq: Two times a day (BID) | INTRAMUSCULAR | Status: DC
  Administered 2016-08-14: 60 mg via INTRAVENOUS
  Filled 2016-08-14: qty 6

## 2016-08-14 MED ORDER — HEPARIN (PORCINE) IN NACL 100-0.45 UNIT/ML-% IJ SOLN
2000.0000 [IU]/h | INTRAMUSCULAR | Status: DC
  Administered 2016-08-14: 1300 [IU]/h via INTRAVENOUS
  Administered 2016-08-15: 1700 [IU]/h via INTRAVENOUS
  Administered 2016-08-15: 1550 [IU]/h via INTRAVENOUS
  Filled 2016-08-14 (×4): qty 250

## 2016-08-14 NOTE — Progress Notes (Signed)
Call received from central monitor informing nurse pt  In ST 122, from NSR of 70-8-.Pt asymptomatic.  Dr Sallyanne Kuster i made aware.  Will continue to monitor.  Karie Kirks, Therapist, sports.

## 2016-08-14 NOTE — Progress Notes (Signed)
ANTICOAGULATION CONSULT NOTE - Follow Up Consult  Pharmacy Consult for Heparin Indication: atrial fibrillation  Allergies  Allergen Reactions  . Codeine Other (See Comments)    agitation    Patient Measurements: Height: 6' (182.9 cm) Weight: 237 lb 4.8 oz (107.6 kg) (a scale) IBW/kg (Calculated) : 77.6 Heparin Dosing Weight:  100.6 kg  Vital Signs: Temp: 97.2 F (36.2 C) (03/26 2100) Temp Source: Oral (03/26 2100) BP: 117/79 (03/26 2100) Pulse Rate: 72 (03/26 2100)  Labs:  Recent Labs  08/12/16 1420 08/13/16 0435 08/14/16 0523 08/14/16 2116  HGB 12.8*  --   --   --   HCT 40.4  --   --   --   PLT 191  --   --   --   HEPARINUNFRC  --   --   --  0.12*  CREATININE 1.39* 1.55* 1.69*  --     Estimated Creatinine Clearance: 56.7 mL/min (A) (by C-G formula based on SCr of 1.69 mg/dL (H)).  Assessment:  Anticoag: Heparin for afib, no AC pta. Has been on Enox 40, last dose 1714 on 3/25 - PM HL 0.12 low  Goal of Therapy:  Heparin level 0.3-0.7 units/ml Monitor platelets by anticoagulation protocol: Yes   Plan:  Bolus heparin 3000 units Increase IV heparin to 1550 units/hr HL and CBC in AM   Brayton Baumgartner S. Alford Highland, PharmD, BCPS Clinical Staff Pharmacist Pager [phone]  Eilene Ghazi Stillinger 08/14/2016,10:00 PM

## 2016-08-14 NOTE — Consult Note (Addendum)
[place] Nurse wound consult note Reason for Consult: LE wounds Patient with hemosiderin staining and evidence of weeping, no active weeping at the time of my assessment Wound type: none, some scaling but most likely related to venous stasis and edema Pressure Injury POA: No Periwound: bilateral LE both with 2+ pitting edema, patient reports this is worse than normal Dressing procedure/placement/frequency: Ok to use xeroform single layer for weeping.  Would suggest Unna's boots for compression if cards ok with this.  I do not see ABI and patient reports some work up for "circulation" in the [place] system.  Reports something about "15%" related to his circulation.  He has palpable pulses but weak, hard to palpate with edema.  I will ask MD to obtain ABI if compression desired.  If normal then we could proceed with Unna's boots since the patient in ambulatory.  Would need HHRN to change Unna's boots 2xwk.  Discussed POC with patient and bedside nurse.  Re consult if needed, will not follow at this time. Thanks  John Hicks [employer], RN,CWOCN, CNS [phone])

## 2016-08-14 NOTE — Progress Notes (Signed)
Notified Andy at around 1520 pt c/o weak and feeling diaphoretic.    BS 159m/dl.  BP and asked if ok to give pt is lasix.  Instructed it was ok for pt to have it.   Will continue to monitor. Karie Kirks, RN

## 2016-08-14 NOTE — Progress Notes (Signed)
Pt is asleep with with no distress ST in 130s-140s beta blocker added by Cardiologist.

## 2016-08-14 NOTE — Progress Notes (Signed)
QTC 510 done at 1510.  Informed [place] QTC due at 16oo.  Pt being transferred now.  Karie Kirks, Therapist, sports.

## 2016-08-14 NOTE — Progress Notes (Signed)
Report called to [place] in 4N regarding pt being transferred.  Verbalized understanding.  Karie Kirks, RN

## 2016-08-14 NOTE — Progress Notes (Addendum)
Progress Note  Patient Name: John Hicks Date of Encounter: 08/14/2016  Primary Cardiologist: [place] wants to transfer care to John Hicks   Patient is feeling well; denies chest pain, SOB, and palpitations. He states the swelling in his legs "keeps coming back."  Inpatient Medications    Scheduled Meds: . carvedilol  3.125 mg Oral BID WC  . cefTRIAXone (ROCEPHIN)  IV  1 g Intravenous Q24H  . enoxaparin (LOVENOX) injection  40 mg Subcutaneous Q24H  . furosemide  40 mg Intravenous Daily  . nystatin   Topical TID  . sodium chloride flush  3 mL Intravenous Q12H  . spironolactone  12.5 mg Oral Daily   Continuous Infusions:  PRN Meds: sodium chloride, acetaminophen, ALPRAZolam, ipratropium-albuterol, ondansetron (ZOFRAN) IV, sodium chloride flush   Vital Signs    Vitals:   08/13/16 1204 08/13/16 2203 08/14/16 0344 08/14/16 0620  BP: (!) 129/97 (!) 126/104 109/83 90/75  Pulse:  93  76  Resp: 18 20 16 18   Temp: 97.8 F (36.6 C)     TempSrc: Oral     SpO2: 99% 99%  94%  Weight:   237 lb 4.8 oz (107.6 kg)   Height:        Intake/Output Summary (Last 24 hours) at 08/14/16 0752 Last data filed at 08/13/16 1942  Gross per 24 hour  Intake              660 ml  Output              850 ml  Net             -190 ml   Filed Weights   08/12/16 1845 08/13/16 0600 08/14/16 0344  Weight: 240 lb 3.2 oz (109 kg) 236 lb 14.4 oz (107.5 kg) 237 lb 4.8 oz (107.6 kg)     Physical Exam   General: Well developed, well nourished, male appearing in no acute distress. Head: Normocephalic, atraumatic.  Neck: Supple without bruits, JVD to level of jaw Lungs:  Resp regular and unlabored, CTA. Heart: Irregular rhythm, regular rate, S1, S2, S4 appreciated, no murmur; no rub. Abdomen: Soft, non-tender, non-distended with normoactive bowel sounds. No hepatomegaly. No rebound/guarding. No obvious abdominal masses. Extremities: No clubbing, cyanosis, 3+ edema evident  to upper thighs, venous stasis skin changes. Distal pedal pulses are faint bilaterally. Neuro: Alert and oriented X 3. Moves all extremities spontaneously. Psych: Normal affect.  Labs    Chemistry Recent Labs Lab 08/12/16 1420 08/13/16 0435 08/14/16 0523  NA 136 137 136  K 4.3 3.7 3.8  CL 104 105 103  CO2 23 21* 21*  GLUCOSE 118* 94 136*  BUN 24* 27* 34*  CREATININE 1.39* 1.55* 1.69*  CALCIUM 9.2 8.8* 8.8*  GFRNONAA 52* 46* 41*  GFRAA >60 53* 48*  ANIONGAP 9 11 12      Hematology Recent Labs Lab 08/12/16 1420  WBC 9.5  RBC 4.62  HGB 12.8*  HCT 40.4  MCV 87.4  MCH 27.7  MCHC 31.7  RDW 17.2*  PLT 191    Cardiac EnzymesNo results for input(s): TROPONINI in the last 168 hours.  Recent Labs Lab 08/12/16 1422  TROPIPOC 0.06     BNP Recent Labs Lab 08/12/16 1637  BNP 2,520.0*     DDimer No results for input(s): DDIMER in the last 168 hours.   Radiology    Dg Chest 2 View  Result Date: 08/12/2016 CLINICAL DATA:  Patient with shortness of  breath for multiple weeks. Lower extremity edema. EXAM: CHEST  2 VIEW COMPARISON:  None. FINDINGS: Marked cardiomegaly. Patchy opacities within the right mid and lower lung. Small right pleural effusion. No pneumothorax. Thoracic spine degenerative changes. IMPRESSION: Patchy heterogeneous opacities within the right mid and lower lung concerning for pneumonia in the appropriate clinical setting. Followup PA and lateral chest X-ray is recommended in 3-4 weeks following trial of antibiotic therapy to ensure resolution and exclude underlying malignancy. Small right pleural effusion. Marked cardiomegaly. A component of pericardial effusion is not excluded. Electronically Signed   By: [name] M.D.   On: 08/12/2016 14:59     Telemetry    NSR with bouts of sinus tachycardia vs atrial flutter  - Personally Reviewed  ECG    08/13/16: Wide QRS tachycardia with occasional PVCs, RBBB, Left anterior fascicular block  - Personally  Reviewed  08/14/16: pending   Cardiac Studies   Echocardiogram 08/14/16: Study Conclusions - Left ventricle: The cavity size was severely dilated. Wall   thickness was increased in a pattern of mild LVH. The estimated   ejection fraction was in the range of 10% to 15%. Diffuse   hypokinesis. Abnormal diastolic dysfunction, indeterminant grade.   Doppler parameters are consistent with high ventricular filling   pressure. - Aortic valve: There was mild to moderate regurgitation. Valve   area (VTI): 3.75 cm^2. Valve area (Vmax): 3.71 cm^2. Valve area   (Vmean): 3.48 cm^2. - Aorta: The visualized portion of the proximal ascending aorta is   mildly dilated at 4.2 cm. Aortic root dimension: 48 mm (ED). - Aortic root: The aortic root was moderately to severely dilated. - Mitral valve: There was mild regurgitation. - Left atrium: The atrium was severely dilated. - Right ventricle: The cavity size was moderately dilated. Systolic   function was mildly reduced. - Right atrium: The atrium was severely dilated.   Patient Profile     66 y.o. male  history of combined CHF, HTN, copd admitted with LE edema and scrotal edema. He is followed through the [place] for his heart failure. Presents with several week history of progressing LE edema, legs are now weaping with fluid. Increased SOB. Occasional palpitations at times.   Assessment & Plan    1. Acute on chronic combined systolic and diastolic heart failure - followed at the [place], prior ischemic workup unknown. He thinks he had a cath at [facility] this year. He also states he has a stent  - echo this admit with LVEF 94-17% with diastolic dysfunction - current medical therapy with aldactone 12.5mg  daily and coreg 3.125mg  bid in setting of systolic dysfunction and tachycardia (yesterday).  - current diuretic regimen is lasix 40 mg IV daily; will increase this to 60 mg IV BID and add Kdur 20 mEq BID - negative negative 1.6 L; wt 237 lbs (245 lbs on  admission); a dry weight is not available at this time  - pt wants to transfer care to [facility] from [place]; will likely need a CHF consult - requested records from [facility]    2. Tachcyardia - tachycardia seems resolved following IV lopressor x 1 and coreg, EKG pending   3. Chronic vs acute on chronic kidney injury - awaiting records - sCr today 1.69 (1.55); sCr on 08/01/16 was 1.33 - he has low urine output although "occurrences" have been documented - continue daily BMP in the presence of diuresis   4. Aortic root aneurysm - measures 4.8 cm by echo - will  need to be monitored over time   5. UTI - currently on rocephin IV - per primary team   Signed, Ledora Bottcher , PA-C 7:52 AM 08/14/2016 Pager: [phone]  I have seen and examined the patient along with Tami Lin Duke PA.  I have reviewed the chart, notes and new data.  I agree with PA's note.  Key new complaints: he is not aware of palpitations, but has episodes of sudden severe weakness and "head rush" which may represent paroxysmal atrial flutter Key examination changes: anasarca - edema to hips, some ascites; S3 gallop, JVD Key new findings / data: severe LV dilation and EF<15%, 4-chamber enlargement consistent with dilated CMP. He reports cardiac cath 1 month ago with arteries OK and unchanged from a year earlier (records are not available from [facility]). Monitor shows he is in and out of atrial flutter with 2:1 AV conduction, PVCs/fusion beats.  PLAN: Anticoagulation. Amiodarone for rate/rhythm control (start IV, transition to PO). IV milrinone for "cold/wet" cardiac failure, if we can control the tachyarrhythmia. Conventional CHF meds limited by BP. May have to stop the beta blocker. Increase diuretics. CHF service consult. Get records from [place]. SW consult (he used to have Medicaid, but no longer active, he is 30% [employer] service connected for a knee injury, he qualifies for full disability/Medicare  in my opinion).  Sanda Klein, MD, [place] [phone] 08/14/2016, 12:18 PM

## 2016-08-14 NOTE — Progress Notes (Addendum)
Pt is alert and oriented with restless. HR 135 gave IV Beta Blocker as orderd plan to give xanax when due.  Express that he is anxious about not being about to go on vacation to the beach for his birthday.

## 2016-08-14 NOTE — Progress Notes (Signed)
Peripherally Inserted Central Catheter/Midline Placement  The IV Nurse has discussed with the patient and/or persons authorized to consent for the patient, the purpose of this procedure and the potential benefits and risks involved with this procedure.  The benefits include less needle sticks, lab draws from the catheter, and the patient may be discharged home with the catheter. Risks include, but not limited to, infection, bleeding, blood clot (thrombus formation), and puncture of an artery; nerve damage and irregular heartbeat and possibility to perform a PICC exchange if needed/ordered by physician.  Alternatives to this procedure were also discussed.  B[employer] Power PICC patient education guide, fact sheet on infection prevention and patient information card has been provided to patient /or left at bedside.    PICC/Midline Placement Documentation        John Hicks 08/14/2016, 5:42 PM

## 2016-08-14 NOTE — Progress Notes (Signed)
Patient had a 7 beats and another 18 beats run of Vtach at 2329. VSS. No change from baseline. Internal medicine notified.

## 2016-08-14 NOTE — Progress Notes (Signed)
ANTICOAGULATION CONSULT NOTE - Initial Consult  Pharmacy Consult for Heparin Indication: atrial fibrillation  Allergies  Allergen Reactions  . Codeine Other (See Comments)    agitation    Patient Measurements: Height: 6' (182.9 cm) Weight: 237 lb 4.8 oz (107.6 kg) (a scale) IBW/kg (Calculated) : 77.6 Heparin Dosing Weight: 97 kg  Vital Signs: BP: 111/91 (03/26 1110) Pulse Rate: 76 (03/26 0620)  Labs:  Recent Labs  08/12/16 1420 08/13/16 0435 08/14/16 0523  HGB 12.8*  --   --   HCT 40.4  --   --   PLT 191  --   --   CREATININE 1.39* 1.55* 1.69*    Estimated Creatinine Clearance: 56.7 mL/min (A) (by C-G formula based on SCr of 1.69 mg/dL (H)).   Medical History: Past Medical History:  Diagnosis Date  . CHF (congestive heart failure) ([place])   . Enlarged heart   . Hypertension    Assessment:   66 yr old male to begin IV heparin for atrial fibrillation.  Has been on Lovenox 40 mg sq q24hrs for VTE prophylaxis.  Last dose ~5pm on 08/13/16.       Also staring Amiodarone drip.  No LFTs yet.    Rx to review for interacting meds.  No current interactions, except for diuretics possibly causing hypokalemia, but on KCl supplement. K+ 3.8 today.  Goal of Therapy:  Heparin level 0.3-0.7 units/ml Monitor platelets by anticoagulation protocol: Yes   Plan:   Heparin 4000 units IV x 1  Heparin drip to begin at 1300 units/hr.  Heparin level ~6 hrs after drip begins.  Daily heparin level and CBC.    Arty Baumgartner, [place] Pager: [phone] 08/14/2016,1:36 PM

## 2016-08-14 NOTE — Consult Note (Addendum)
Advanced Heart Failure Team Consult Note  Referring Physician: Dr. Tommy Medal Primary Physician/Cardiologist: [facility]  Reason for Consultation: A/C systolic CHF  HPI:    John Hicks is a 66 y.o. male with history of CHF (unclear etiology, [facility]), HTN, and COPD.   Pt admitted 08/12/16 LE and scrotal edema.  Had previously been followed by [facility] for his HF, though admittedly with poor compliance per patient.  Pertinent labs on admission include. K 4.3, Cr 1.39, hgb 12.8, Plt 191, BNP 2520,  CXR with ? Pericardial effusion.   EKG shows NSR and RBBB.   Echo 08/13/16 with LVEF 10-15%, abnormal diastolic dysfunction, Mild/Mod AI, Mod/Severely dilated Aortic root, Mild MR, severe LAE, RV moderately dilated and mildly reduced, severe RAE.   AHF team consulted with slow diuresis and possible low output with "cold/wet" presentation of HF.   Pt feeling poorly today. Having dyspnea with minimal exertion and severe fatigue with even attempting to eat lunch.  At home was SOB with putting on clothes and bathing, and occasionally at rest. Had lightheadedness, near-syncope, and palpitations as well.  Smokes 2 packs per day. Stopped " 1 week ago" with 43 years ( At least 60+ pack years). Has "breathing spells" where SOB is much worse, has chest pressure associated with these.  States he had heart cath in January that was "unchanged" from previous. SOB lying flat. Not sleeping well. Pt states he did have a "bad cold" several weeks ago.   Out 1.5 L and down 3 lbs from admit so far. Creatine 1.69 with unclear baseline.  Very restless. Says he lives at home with a roommate. Very limited insight into his heart condition. Unable to tell me about previous heart function or name of person he sees at the [place]. He seems to realize that he is very sick. Says he wants full code for now but is a bit unsure.   Review of Systems: [y] = yes, [ ]  = no   General: Weight gain [y]; Weight loss [ ] ; Anorexia [  ]; Fatigue Blue.Reese ]; Fever [ ] ; Chills [ ] ; Weakness Blue.Reese ]  Cardiac: Chest pain/pressure [y]; Resting SOB [y]; Exertional SOB [y]; Orthopnea [y]; Pedal Edema [y]; Palpitations [ ] ; Syncope [ ] ; Presyncope [y]; Paroxysmal nocturnal dyspnea[y ]  Pulmonary: Cough [ ] ; Wheezing[ ] ; Hemoptysis[ ] ; Sputum [ ] ; Snoring [ ]   GI: Vomiting[ ] ; Dysphagia[ ] ; Melena[ ] ; Hematochezia [ ] ; Heartburn[ ] ; Abdominal pain [ ] ; Constipation [ ] ; Diarrhea [ ] ; BRBPR [ ]   GU: Hematuria[ ] ; Dysuria [ ] ; Nocturia[ ]   Vascular: Pain in legs with walking [ ] ; Pain in feet with lying flat [ ] ; Non-healing sores [ ] ; Stroke [ ] ; TIA [ ] ; Slurred speech [ ] ;  Neuro: Headaches[ ] ; Vertigo[ ] ; Seizures[ ] ; Paresthesias[ ] ;Blurred vision [ ] ; Diplopia [ ] ; Vision changes [ ]   Ortho/Skin: Arthritis [y]; Joint pain [y]; Muscle pain Blue.Reese ]; Joint swelling [ ] ; Back Pain Blue.Reese ]; Rash [ ]   Psych: Depression[y]; Anxiety[ ]   Heme: Bleeding problems [ ] ; Clotting disorders [ ] ; Anemia [ ]   Endocrine: Diabetes [ ] ; Thyroid dysfunction[ ]   Home Medications Prior to Admission medications   Medication Sig Start Date End Date Taking? Authorizing Provider  ALPRAZolam Duanne Moron) 1 MG tablet Take 0.5 mg by mouth daily.   Yes Historical Provider, MD  aspirin 81 MG chewable tablet Chew 81 mg by mouth daily.   Yes Historical Provider, MD  lisinopril (PRINIVIL,ZESTRIL) 40 MG tablet Take 20 mg by mouth daily.   Yes Historical Provider, MD  [place] into both nostrils See admin instructions. Over the counter nasal decongestant - use daily as needed for runny nose   Yes Historical Provider, MD  spironolactone (ALDACTONE) 25 MG tablet Take 12.5 mg by mouth daily.   Yes Historical Provider, MD    Past Medical History: Past Medical History:  Diagnosis Date  . CHF (congestive heart failure) ([place])   . Enlarged heart   . Hypertension     Past Surgical History: Past Surgical History:  Procedure Laterality Date  . APPENDECTOMY      . KNEE SURGERY      Family History: Unable to provide much detail about Fhx. Denies having multiple family members with SCD, HF or premature CAD.   Social History: Social History   Social History  . Marital status: Single    Spouse name: N/A  . Number of children: N/A  . Years of education: N/A   Social History Main Topics  . Smoking status: Current Every Day Smoker    Packs/day: 0.10    Types: Cigarettes  . Smokeless tobacco: Never Used  . Alcohol use No  . Drug use: Yes    Types: Marijuana  . Sexual activity: Not Asked   Other Topics Concern  . None   Social History Narrative  . None    Allergies:  Allergies  Allergen Reactions  . Codeine Other (See Comments)    agitation    Objective:    Vital Signs:   Pulse Rate:  [76-93] 76 (03/26 0620) Resp:  [16-20] 18 (03/26 0620) BP: (90-126)/(75-104) 111/91 (03/26 1110) SpO2:  [94 %-99 %] 94 % (03/26 0620) Weight:  [237 lb 4.8 oz (107.6 kg)] 237 lb 4.8 oz (107.6 kg) (03/26 0344) Last BM Date: 08/12/16  Weight change: Filed Weights   08/12/16 1845 08/13/16 0600 08/14/16 0344  Weight: 240 lb 3.2 oz (109 kg) 236 lb 14.4 oz (107.5 kg) 237 lb 4.8 oz (107.6 kg)    Intake/Output:   Intake/Output Summary (Last 24 hours) at 08/14/16 1227 Last data filed at 08/14/16 0918  Gross per 24 hour  Intake              440 ml  Output              550 ml  Net             -110 ml     Physical Exam: General:  Chronically ill and fatigued appearing. Mildly dyspneic with conversation. Sitting in chair. Restless  HEENT: Normal anicteric. Poor dentition. Neck: Supple. JVP to jaw. Carotids 2+ bilat; no bruits. No lymphadenopathy or thyromegaly appreciated. Cor: PMI laterally displaced. Regular, tachycardic. Distant heart sounds. +s3 2/6 MR Lungs: Diminished basilar sounds.  Crackles at bases Abdomen: obese soft, nontender, moderately distended. No obvious hepatosplenomegaly. No bruits or masses. Good bowel sounds. Extremities:  no cyanosis, clubbing. Bilateral legs with marked 3+ edema up into thighs. Chronic venous stasis change bilateral lower legs with erythema. + scrotal edema Neuro: Alert & oriented x 3. Cranial nerves grossly intact. Moves all 4 extremities w/o difficulty. Affect flat but pleasant GU: Edematous scrotum.   Telemetry: Rhythm uncertain may be sinus tach versus atrial tach. Rates 90-140s. Personally reviewed   Labs: Basic Metabolic Panel:  Recent Labs Lab 08/12/16 1420 08/13/16 0435 08/14/16 0523  NA 136 137 136  K 4.3 3.7 3.8  CL  104 105 103  CO2 23 21* 21*  GLUCOSE 118* 94 136*  BUN 24* 27* 34*  CREATININE 1.39* 1.55* 1.69*  CALCIUM 9.2 8.8* 8.8*    Liver Function Tests: No results for input(s): AST, ALT, ALKPHOS, BILITOT, PROT, ALBUMIN in the last 168 hours. No results for input(s): LIPASE, AMYLASE in the last 168 hours. No results for input(s): AMMONIA in the last 168 hours.  CBC:  Recent Labs Lab 08/12/16 1420  WBC 9.5  HGB 12.8*  HCT 40.4  MCV 87.4  PLT 191    Cardiac Enzymes: No results for input(s): CKTOTAL, CKMB, CKMBINDEX, TROPONINI in the last 168 hours.  BNP: BNP (last 3 results)  Recent Labs  08/12/16 1637  BNP 2,520.0*    ProBNP (last 3 results) No results for input(s): PROBNP in the last 8760 hours.   CBG: No results for input(s): GLUCAP in the last 168 hours.  Coagulation Studies: No results for input(s): LABPROT, INR in the last 72 hours.  Other results: EKG: 08/13/16 with SR versus atrial tachycardia with PVCs, RBBB, LAFB, Rate 146 bpm.   Imaging:  No results found.   Medications:     Current Medications: . amiodarone  150 mg Intravenous Once  . carvedilol  3.125 mg Oral BID WC  . cefTRIAXone (ROCEPHIN)  IV  1 g Intravenous Q24H  . enoxaparin (LOVENOX) injection  40 mg Subcutaneous Q24H  . furosemide  60 mg Intravenous BID  . nystatin   Topical TID  . potassium chloride  20 mEq Oral BID  . sodium chloride flush  3 mL  Intravenous Q12H  . spironolactone  12.5 mg Oral Daily     Infusions: . amiodarone     Followed by  . amiodarone        Assessment/Plan   1. A/C combined CHF of unclear etiology - LVEF 10-15%.  2. Tachycardia 3. AKI, likely on CKD - Awaiting outside paperwork 4. Aortic root aneurysm 5. Paroxysmal Afib/Atrial tach 6. UTI 7. Tobacco abuse  Pt massively volume overloaded with 3+ edema into thighs and NYHA IIIb-IV symptoms. EF 10-15% by Echo. Awaiting cath notes from [place]. I personally completed record request and received fax confirmation.  Unclear etiology. Unsure if ICM vs NICM but ? tachy-mediated component.   Pt was smoking up to 2 packs per day up to last week.  60-80 pack years.   STOP BB with decompensation. Think low output could be contributing factor along with his tachyarrhythmia.   Pt with massive volume overload.  Increase lasix to 80 mg TID. May need inotrope support with markedly decreased EF.  PICC line ordered.   This patients CHA2DS2-VASc is at least 2 with CHF and HTN.  Currently on heparin gtt for Afib vs Atach. Agree with starting amio. Will bolus.   Follow creatinine closely with diuresis.   Continue ceftriaxone for UTI. ? If early sepsis a component of his decompensation > tachycardia. Afebrile.  UCx with E. Coli. Ceftriaxone covers.   Will move to stepdown for PICC, CVP, and Coox.  Length of Stay: 2  Annamaria Helling  08/14/2016, 12:27 PM  Advanced Heart Failure Team Pager [phone] (M-F; 7a - 4p)  Please contact [facility] for night-coverage after hours (4p -7a ) and weekends on [email or web address]  Agree with above.   Echo images, ECG and CXR viewed personally. Available notes reviewed. I attempted to call CCU at [facility] multiple times but unable to get through. Request submitted for records.  Patient in  profound cardiogenic shock. We have moved to SDU and placed PICC. Initial co-ox is 25%. CVP markedly elevated with massive volume  overload in setting of severe biventricular dysfunction. He is unable to tell me much about his cardiac history. I am not sure if this is due to low output or just lack of insight into his disease.   Appears to have NICM. Possible tachy related. Monitor shows probable episodes of ALF or arial tach. We have started milrinone and amiodarone. Continue IV lasix. B-blocker stopped. Need to watch renal function closely.   Prognosis extremely poor and he is not candidate for advanced therapies given social situation and comorbidities. We discussed code status and currently wants to remain Full Code but has some questions about this.   The patient is critically ill with multiple organ systems failure and requires high complexity decision making for assessment and support, frequent evaluation and titration of therapies, application of advanced monitoring technologies and extensive interpretation of multiple databases.   Critical Care Time devoted to patient care services described in this note is 45 Minutes. This time reflects time of care of this signee, Dr. Pierre Bali who personally examined the patient and determined the plan of care. This critical care time does not reflect procedure time, teaching time or supervisory time of our PA/NP/resident but could involve care discussion and coordination time.  Glori Bickers, MD  10:27 PM

## 2016-08-14 NOTE — Progress Notes (Signed)
  Date: 08/14/2016  Patient name: John Hicks  Medical record number: [id number]  Date of birth: 01/11/1951   This patient's plan of care was discussed with the house staff. Please see their note for complete details. I concur with their findings.  Patient was comfortable when we examined him this am. He has marked 3+ edema on LE with chronic venous stasis changes. His lungs were clear and without rales, wheezes. His heart was RRR this am without mgr,   #1  Acute on chronic systolic (ef 42%) and diastolic heart failure:  Agree he needs more aggressive diuresis. Greatly appreciate Cardiology's help. They recommend CHF consult.  Patient clear he would prefer to be followed by Cardiology at [facility] who  Apparently orginally dx him with CHF several decades ago.  He may still be able to get his meds for free from [place] if provider their writes scripts (and agrees with plan by non [facility])  I wonder if he would be candidate for device. We are seeking records from [place] to see where he is in terms of heat fxn compared to prior TTE and when most recent cardiac caths  #2 Tachycardia: He apparently did have run of PVC's this weekend as well. He is currently on carvedilol (previously on metoprolol but did not tolerate it well)  #3 Acute on chronic renal insufficiency: Cr trending up over past few days. Will need to watch esp light of aggressive diuresis      Truman Hayward, MD 08/14/2016, 11:51 AM

## 2016-08-14 NOTE — Progress Notes (Signed)
   Subjective: John Hicks was seen and evaluated today at bedside. Patient continues to report bilateral lower extremity pain and weeping. Frustrated over her inability to sleep due to frequent awakenings by hospital staff however he understands the importance of labs and frequent visits.  Objective:  Vital signs in last 24 hours: Vitals:   08/13/16 2203 08/14/16 0344 08/14/16 0620 08/14/16 1110  BP: (!) 126/104 109/83 90/75 (!) 111/91  Pulse: 93  76   Resp: 20 16 18    Temp:      TempSrc:      SpO2: 99%  94%   Weight:  237 lb 4.8 oz (107.6 kg)    Height:       General: Chronically-ill appearing caucasian man sleeping in bed. In no acute distress. Fatigued upon awakening. HENT: [place], AT. No conjunctival injection, icterus or ptosis. +JVD. Cardiovascular: Tachycardic, rate high 90's. Frequent PVCs. No rub appreciated. Pulmonary: CTA BL. Breathing unlabored.  Abdomen: Soft. +bowel sounds.  Extremities: 2-3+ pitting edema up to hips. BL LE erythema slightly improved however LE continue to weep. Feet cold.  Psych: Mood normal and affect was mood congruent.  Echocardiogram 08/13/16: LVEF 10-15% with diffuse wall hypokinesis. Severe dilation of left ventricle.-Ventricular filling pressures appreciated. Moderately to severely dilated aortic root at 4.5 cm. Telemetry review 08/14/16: Mostly rate controlled in the 70s to 80s however one episode of tachycardia to the 130s which responded to IV metoprolol 5 mg  Assessment/Plan:  Principal Problem:   Acute exacerbation of congestive heart failure (HCC) Active Problems:   Tobacco use disorder   Essential hypertension   Urinary tract infection  Acute on chronic combined congestive heart failure: Weight on admission to 45, current weight 237. Recorded net output 1.6 L however do not believe this is accurate. He continues to remain significantly volume up on examination with 3+ pitting edema bilaterally with edema extending up towards the  abdomen.Yesterday's echo demonstrated LV EF of 10-15% with diffuse wall hypokinesis. Additionally he is quite significantly volume up on examination. Cardiology recommends heart failure consultation and heart failure team has been consult it. We greatly appreciate advanced heart failures assistance in management of this patient as more aggressive diuresis is indicated. -Cardiology and heart failure on board, appreciate the recommendations -Diuresis per heart failure team, has been receiving IV Lasix 40 daily -STRICT I&O and daily weights -Wound care on board, appreciate their assistance in management of this patient   Supraventricular tachycardia versus atrial flutter/fibrillation: Appreciate cardiology's recommendations. Rates much improved since starting Coreg 3.125 mg twice a day per cardiology yesterday. Did require one time dose of IV metoprolol 5 mg overnight when his rates reached 130. Still continues to have frequent PVCs.  -Coreg 3.125 twice a day per cardiology recommendations -Continue telemetry -Heparin started for Afib per cards.   Acute on chronic renal insufficiency: Unclear baseline however creatinine continues to creep up over the past several days. This could be sequela of heart failure and his massive volume overload however we'll need to keep an eye on this especially in light of aggressive diuresis. -BMET in AM  Urinary Tract Infection, Candidal intertrigo: On day 3/x of IV Ceftriaxone for UTI. Culture so far >100,000 units E.coli, sensitivities pending. Will narrow based on C&S results. Reports intertrigo improved with nystatin powder.  -IV Ceftriaxone  Anxiety: On home dose of xanax, 0.5 mg BID PRN.  Dispo: Anticipated discharge unclear. Still has significant volume to diurese.   Kamilo Och, DO 08/14/2016, 11:58 AM Pager: [phone]

## 2016-08-15 DIAGNOSIS — I5043 Acute on chronic combined systolic (congestive) and diastolic (congestive) heart failure: Secondary | ICD-10-CM | POA: Diagnosis not present

## 2016-08-15 DIAGNOSIS — R570 Cardiogenic shock: Secondary | ICD-10-CM | POA: Diagnosis not present

## 2016-08-15 DIAGNOSIS — N179 Acute kidney failure, unspecified: Secondary | ICD-10-CM | POA: Diagnosis present

## 2016-08-15 LAB — CBC
HEMATOCRIT: 36.1 % — AB (ref 39.0–52.0)
HEMOGLOBIN: 11.1 g/dL — AB (ref 13.0–17.0)
MCH: 26.5 pg (ref 26.0–34.0)
MCHC: 30.7 g/dL (ref 30.0–36.0)
MCV: 86.2 fL (ref 78.0–100.0)
Platelets: 168 10*3/uL (ref 150–400)
RBC: 4.19 MIL/uL — ABNORMAL LOW (ref 4.22–5.81)
RDW: 17.4 % — ABNORMAL HIGH (ref 11.5–15.5)
WBC: 7.2 10*3/uL (ref 4.0–10.5)

## 2016-08-15 LAB — BASIC METABOLIC PANEL
Anion gap: 10 (ref 5–15)
BUN: 38 mg/dL — AB (ref 6–20)
CHLORIDE: 101 mmol/L (ref 101–111)
CO2: 24 mmol/L (ref 22–32)
CREATININE: 1.78 mg/dL — AB (ref 0.61–1.24)
Calcium: 8.4 mg/dL — ABNORMAL LOW (ref 8.9–10.3)
GFR calc Af Amer: 45 mL/min — ABNORMAL LOW (ref >60)
GFR calc non Af Amer: 39 mL/min — ABNORMAL LOW (ref >60)
GLUCOSE: 103 mg/dL — AB (ref 65–99)
POTASSIUM: 3.5 mmol/L (ref 3.5–5.1)
Sodium: 135 mmol/L (ref 135–145)

## 2016-08-15 LAB — COOXEMETRY PANEL
Carboxyhemoglobin: 1.6 % — ABNORMAL HIGH (ref 0.5–1.5)
METHEMOGLOBIN: 0.6 % (ref 0.0–1.5)
O2 Saturation: 63.2 %
TOTAL HEMOGLOBIN: 17.8 g/dL — AB (ref 12.0–16.0)

## 2016-08-15 LAB — MAGNESIUM: Magnesium: 1.9 mg/dL (ref 1.7–2.4)

## 2016-08-15 LAB — URINE CULTURE

## 2016-08-15 LAB — HEPARIN LEVEL (UNFRACTIONATED): Heparin Unfractionated: 0.22 IU/mL — ABNORMAL LOW (ref 0.30–0.70)

## 2016-08-15 MED ORDER — CEPHALEXIN 500 MG PO CAPS
500.0000 mg | ORAL_CAPSULE | Freq: Three times a day (TID) | ORAL | Status: AC
  Administered 2016-08-15 (×3): 500 mg via ORAL
  Filled 2016-08-15 (×3): qty 1

## 2016-08-15 MED ORDER — NICOTINE 21 MG/24HR TD PT24
21.0000 mg | MEDICATED_PATCH | Freq: Every day | TRANSDERMAL | Status: DC
  Administered 2016-08-15 – 2016-08-23 (×9): 21 mg via TRANSDERMAL
  Filled 2016-08-15 (×9): qty 1

## 2016-08-15 MED ORDER — DIGOXIN 125 MCG PO TABS
0.1250 mg | ORAL_TABLET | Freq: Every day | ORAL | Status: DC
  Administered 2016-08-15 – 2016-08-23 (×9): 0.125 mg via ORAL
  Filled 2016-08-15 (×9): qty 1

## 2016-08-15 MED ORDER — MAGNESIUM SULFATE 2 GM/50ML IV SOLN
2.0000 g | Freq: Once | INTRAVENOUS | Status: AC
  Administered 2016-08-15: 2 g via INTRAVENOUS
  Filled 2016-08-15: qty 50

## 2016-08-15 MED ORDER — POTASSIUM CHLORIDE CRYS ER 20 MEQ PO TBCR
40.0000 meq | EXTENDED_RELEASE_TABLET | Freq: Two times a day (BID) | ORAL | Status: DC
  Administered 2016-08-15 – 2016-08-23 (×17): 40 meq via ORAL
  Filled 2016-08-15 (×17): qty 2

## 2016-08-15 NOTE — Progress Notes (Signed)
Advanced Heart Failure Rounding Note  PCP/Cardiologist: [facility]  Subjective:    AHF team consulted 08/14/16 with difficult diuresis and EF 10-15%. PICC placed with concerns for low output HF.  Initial Mixed venous saturation 25%. Started on milrinone 0.375 mcg/kg/min.   Coox 62.3% this am on milrinone 0.375 mcg/kg/min.   Feeling better this morning.  Denies SOB in bed. Much less dyspneic with conversation.  Feels like he may have a little more energy, at least easier to move around in bed.  Denies lightheadedness or dizziness. Noticed a big increase in urine output. Much improved. Breathing easier. Diuresis picking up with clear urine.   K 3.5. Mg 1.9  Out 1.2 L.  Weight shows up (Moved to different floor). Creatinine 1.69 -> 1.78.  Objective:   Weight Range: 246 lb 1.6 oz (111.6 kg) Body mass index is 33.38 kg/m.   Vital Signs:   Temp:  [96.9 F (36.1 C)-97.6 F (36.4 C)] 97.6 F (36.4 C) (03/27 0424) Pulse Rate:  [62-82] 62 (03/27 0424) Resp:  [17-21] 17 (03/27 0424) BP: (99-122)/(73-94) 122/73 (03/27 0424) SpO2:  [93 %-100 %] 93 % (03/27 0424) Weight:  [246 lb 1.6 oz (111.6 kg)] 246 lb 1.6 oz (111.6 kg) (03/27 0424) Last BM Date: 08/12/16  Weight change: Filed Weights   08/13/16 0600 08/14/16 0344 08/15/16 0424  Weight: 236 lb 14.4 oz (107.5 kg) 237 lb 4.8 oz (107.6 kg) 246 lb 1.6 oz (111.6 kg)   Intake/Output:   Intake/Output Summary (Last 24 hours) at 08/15/16 0720 Last data filed at 08/15/16 0615  Gross per 24 hour  Intake          1212.33 ml  Output             2500 ml  Net         -1287.67 ml     Physical Exam: General:  NAD. Much brighter Sitting up in bed  HEENT: Normal x for poor dentition. Anicteric Neck: supple. JVP at least 10-12 cm. Carotids 2+ bilat; no bruits. No lymphadenopathy or thyromegaly appreciated. Cor: PMI laterally displaced. Regular, tachycardic +s2 2/6 MR.  Lungs: Decreased basilar sounds with crackles. No wheezing Abdomen:  Obese, soft, NT, moderately distended, no HSM. No bruits or masses. +BS  Extremities: no cyanosis, clubbing. 2-3+ edema Chronic venous stasis changes BLE.  Neuro: alert & orientedx3, cranial nerves grossly intact. moves all 4 extremities w/o difficulty. Affect flat  Telemetry: Mostly NSR with occasional ? 2:1 AFL.  PVCs and occasional NSVT Personally reviewed   Labs: CBC  Recent Labs  08/12/16 1420 08/15/16 0620  WBC 9.5 7.2  HGB 12.8* 11.1*  HCT 40.4 36.1*  MCV 87.4 86.2  PLT 191 656   Basic Metabolic Panel  Recent Labs  08/14/16 0523 08/15/16 0620  NA 136 135  K 3.8 3.5  CL 103 101  CO2 21* 24  GLUCOSE 136* 103*  BUN 34* 38*  CREATININE 1.69* 1.78*  CALCIUM 8.8* 8.4*  MG  --  1.9   Liver Function Tests No results for input(s): AST, ALT, ALKPHOS, BILITOT, PROT, ALBUMIN in the last 72 hours. No results for input(s): LIPASE, AMYLASE in the last 72 hours. Cardiac Enzymes No results for input(s): CKTOTAL, CKMB, CKMBINDEX, TROPONINI in the last 72 hours.  BNP: BNP (last 3 results)  Recent Labs  08/12/16 1637  BNP 2,520.0*    ProBNP (last 3 results) No results for input(s): PROBNP in the last 8760 hours.   D-Dimer No results  for input(s): DDIMER in the last 72 hours. Hemoglobin A1C No results for input(s): HGBA1C in the last 72 hours. Fasting Lipid Panel No results for input(s): CHOL, HDL, LDLCALC, TRIG, CHOLHDL, LDLDIRECT in the last 72 hours. Thyroid Function Tests  Recent Labs  08/13/16 1410  TSH 2.832    Other results:     Imaging/Studies:   No results found.    Medications:     Scheduled Medications: . amiodarone  150 mg Intravenous Once  . cefTRIAXone (ROCEPHIN)  IV  1 g Intravenous Q24H  . furosemide  80 mg Intravenous Q8H  . nystatin   Topical TID  . potassium chloride  20 mEq Oral BID  . sodium chloride flush  10-40 mL Intracatheter Q12H  . sodium chloride flush  3 mL Intravenous Q12H  . spironolactone  12.5 mg Oral Daily      Infusions: . amiodarone 30 mg/hr (08/14/16 2229)  . heparin 1,550 Units/hr (08/15/16 0323)  . milrinone 0.375 mcg/kg/min (08/15/16 0216)     PRN Medications:  sodium chloride, acetaminophen, ALPRAZolam, ipratropium-albuterol, ondansetron (ZOFRAN) IV, sodium chloride flush, sodium chloride flush   Assessment/Plan   1. Cardiogenic shock due to A/C biventricular HF. - LVEF 10-15% with severe RV dysfunction 2. Acute on chronic systolic HF 3. Paroxysmal AFL vs Atrial Tachycardia 4. AKI, likely on CKD - Awaiting outside paperwork 5. Aortic root aneurysm 6. UTI 7. Tobacco abuse 8. NSVT  Feeling better this am.  Coox much improved on milrinone 0.375 mcg/kg/min.  Continue amiodarone for AFL/Atach.  Supp electrolytes with goal K > 4.0 and Mg > 2.0  CVP 9-10 but still has massive peripheral edema.  Continue lasix 80 mg TID for today. Clear urine.   Place unna boots.   Tolerating spiro 12.5 mg daily, pressures have been soft so will leave here for now.   Will add digoxin 0.125 mg daily.  Watch renal function closely. May have to back off.   No Ace/ARB/ARNI for now with elevated creatinine and relatively soft pressures. No BB with low output.   Not good candidate for LVAD with RV dysfunction and lack of insurance.  Not good candidate for transplant with lack of social support and recent/on-going tobacco abuse.   Length of Stay: 3  Annamaria Helling  08/15/2016, 7:20 AM  Advanced Heart Failure Team Pager [phone] (M-F; 7a - 4p)  Please contact [facility] for night-coverage after hours (4p -7a ) and weekends on [email or web address]  Patient seen and examined with Oda Kilts, PA-C. We discussed all aspects of the encounter. I agree with the assessment and plan as stated above.   He has end-staged biventricular HF with massive volume overload.  Much improved with milrinone.Continue IV diuresis. Supp electrolytes. Hopefully NSVT won't worsen.    Mental status clearer.  Renal function stabilizing. In/out AFL/atach. Continue amio. Would be very careful with digoxin given renal function. No ARB/b-blocker yet.   Not good candidate for home milrinone or advanced therapies. Will attempt to diurese fully then wean milrinone as tolerated.   Glori Bickers, MD  10:54 PM

## 2016-08-15 NOTE — Plan of Care (Signed)
Problem: Safety: Goal: Ability to remain free from injury will improve Outcome: Progressing Call don't fall policy reinforced, yellow sock, 2 urinals within reach.  Problem: Skin Integrity: Goal: Risk for impaired skin integrity will decrease Outcome: Progressing Bilateral LE skin wrapped and elevated per order. Carefull handling of patient. Patient assisted with adequate protein choices in diet.

## 2016-08-15 NOTE — Progress Notes (Signed)
Orthopedic Tech Progress Note Patient Details:  John Hicks 09-29-1950 [id number]  Ortho Devices Type of Ortho Device: Louretta Parma boot Ortho Device/Splint Location: bilateral Ortho Device/Splint Interventions: Application   John Hicks 08/15/2016, 1:16 PM

## 2016-08-15 NOTE — Progress Notes (Signed)
ANTICOAGULATION CONSULT NOTE - Follow Up Consult  Pharmacy Consult for Heparin Indication: atrial fibrillation  Allergies  Allergen Reactions  . Codeine Other (See Comments)    agitation    Patient Measurements: Height: 6' (182.9 cm) Weight: 246 lb 1.6 oz (111.6 kg) IBW/kg (Calculated) : 77.6 Heparin Dosing Weight:  100.6 kg  Vital Signs: Temp: 97.6 F (36.4 C) (03/27 0424) Temp Source: Oral (03/27 0424) BP: 122/73 (03/27 0424) Pulse Rate: 62 (03/27 0424)  Labs:  Recent Labs  08/12/16 1420 08/13/16 0435 08/14/16 0523 08/14/16 2116 08/15/16 0620  HGB 12.8*  --   --   --  11.1*  HCT 40.4  --   --   --  36.1*  PLT 191  --   --   --  168  HEPARINUNFRC  --   --   --  0.12* 0.22*  CREATININE 1.39* 1.55* 1.69*  --  1.78*    Estimated Creatinine Clearance: 54.8 mL/min (A) (by C-G formula based on SCr of 1.78 mg/dL (H)).  Assessment:  Anticoag: Heparin for afib, no AC pta. Has been on Enox 40, last dose 1714 on 3/25 - heparin drip 1550 uts/hr HL 0.22 < goal.  No bleeding noted  Goal of Therapy:  Heparin level 0.3-0.7 units/ml Monitor platelets by anticoagulation protocol: Yes   Plan:  Increase IV heparin to 1700 units/hr HL and CBC in AM   [place].D. CPP, BCPS Clinical Pharmacist [phone] 08/15/2016 7:27 AM

## 2016-08-15 NOTE — Progress Notes (Signed)
Pt had 6 bt of vtach at 2130 per central monitoring, pt asymptomatic at this time. Dr. Neena Rhymes paged and made aware. No new orders at this time, will continue to monitor closely.

## 2016-08-15 NOTE — Progress Notes (Signed)
   Subjective: Mr. John Hicks was seen and evaluated today at bedside. Feels a little better since admission. No new complaints.  Objective:  Vital signs in last 24 hours: Vitals:   08/14/16 2100 08/15/16 0003 08/15/16 0424 08/15/16 0700  BP: 117/79 106/73 122/73 116/80  Pulse: 72 70 62 (!) 39  Resp: (!) 21 17 17 19   Temp: 97.2 F (36.2 C) 97.6 F (36.4 C) 97.6 F (36.4 C) 97.7 F (36.5 C)  TempSrc: Oral Oral Oral Oral  SpO2: 100% 93% 93% 97%  Weight:   246 lb 1.6 oz (111.6 kg)   Height:       General: Chronically-ill appearing caucasian man resting in bed. In no acute distress. HENT: [place], AT. No conjunctival injection, icterus or ptosis. +JVD. Cardiovascular: RRR. No rub appreciated. Pulmonary: CTA BL. Breathing unlabored.  Abdomen: Soft. +bowel sounds.  Extremities: 2-3+ pitting edema up to hips. BL LE erythema slightly improved, weeping improved. Feet cold.  Psych: Mood normal and affect was mood congruent.  Echocardiogram 08/13/16: LVEF 10-15% with diffuse wall hypokinesis. Severe dilation of left ventricle.-Ventricular filling pressures appreciated. Moderately to severely dilated aortic root at 4.5 cm. Telemetry review 08/15/16: Rates much better controlled. No VTACH appreciated. Freqent PVCs. Assessment/Plan:  Principal Problem:   Acute exacerbation of congestive heart failure (HCC) Active Problems:   Tobacco use disorder   Essential hypertension   Urinary tract infection   Typical atrial flutter (HCC)  Acute on chronic combined congestive heart failure: HF team on board and we greatly appreciate their assistance in management of this extremely volume overloaded individual. Have transferred to SDU, placed PICC line, started milrinone and increased IV lasix to 80 mg TID. CW 246 however transferred floors, different scales. Recorded net output 3.2 L. Will continue to monitor daily weights, strict I&O. Saturating well without respiratory distress.  -Greatly appreciate cardiology  and advanced HF team input! -Diuresis per HF - currently on IV Lasix 80 mg TID + Milrinone.  -Wound care on board, appreciate their assistance in management of this patient   Atrial flutter/fib/atrial tachcyardia: Appreciate cardiology's recommendations. BB stopped and was transitioned to amiodarone and heparin yesterday. Tele reviewed, without further [facility] although continues to show frequent PVCs.  -Amiodarone per cardiology -Heparin per cardiology & pharmacy -Continue telemetry   Acute on chronic renal insufficiency: Unclear baseline due to lack of records. Cr 1.78 today, continues to creep up. Will need to keep close eye on this in setting of aggressive diuresis.  -BMET in AM  Urinary Tract Infection, Candidal intertrigo: On day 4/5 abx for ?uti in this poor historian. C&S with >100,000 pansensitive E.coli. Pt still unable to give clear history in regards to urinary symptoms although did complain of some uncomfortable sensation on presentation with urination.  -IV Ceftriaxone --> Oral keflex today to complete a 5 day course -Nystatin powder  Anxiety: On home dose of xanax, 0.5 mg BID PRN.  Dispo: Anticipated discharge unclear. Still has significant volume to diurese.   Karel Mowers, DO 08/15/2016, 9:27 AM Pager: [phone]

## 2016-08-16 DIAGNOSIS — I5043 Acute on chronic combined systolic (congestive) and diastolic (congestive) heart failure: Secondary | ICD-10-CM | POA: Diagnosis not present

## 2016-08-16 DIAGNOSIS — R570 Cardiogenic shock: Secondary | ICD-10-CM | POA: Diagnosis not present

## 2016-08-16 DIAGNOSIS — N179 Acute kidney failure, unspecified: Secondary | ICD-10-CM | POA: Diagnosis not present

## 2016-08-16 LAB — HEPARIN LEVEL (UNFRACTIONATED)
HEPARIN UNFRACTIONATED: 0.18 [IU]/mL — AB (ref 0.30–0.70)
Heparin Unfractionated: 0.24 IU/mL — ABNORMAL LOW (ref 0.30–0.70)
Heparin Unfractionated: 0.4 IU/mL (ref 0.30–0.70)

## 2016-08-16 LAB — CBC
HCT: 37.1 % — ABNORMAL LOW (ref 39.0–52.0)
Hemoglobin: 11.6 g/dL — ABNORMAL LOW (ref 13.0–17.0)
MCH: 26.8 pg (ref 26.0–34.0)
MCHC: 31.3 g/dL (ref 30.0–36.0)
MCV: 85.7 fL (ref 78.0–100.0)
PLATELETS: 185 10*3/uL (ref 150–400)
RBC: 4.33 MIL/uL (ref 4.22–5.81)
RDW: 17.2 % — ABNORMAL HIGH (ref 11.5–15.5)
WBC: 8.1 10*3/uL (ref 4.0–10.5)

## 2016-08-16 LAB — BASIC METABOLIC PANEL
ANION GAP: 10 (ref 5–15)
BUN: 28 mg/dL — AB (ref 6–20)
CALCIUM: 8.5 mg/dL — AB (ref 8.9–10.3)
CO2: 28 mmol/L (ref 22–32)
Chloride: 101 mmol/L (ref 101–111)
Creatinine, Ser: 1.58 mg/dL — ABNORMAL HIGH (ref 0.61–1.24)
GFR calc Af Amer: 52 mL/min — ABNORMAL LOW (ref >60)
GFR, EST NON AFRICAN AMERICAN: 45 mL/min — AB (ref >60)
GLUCOSE: 102 mg/dL — AB (ref 65–99)
POTASSIUM: 3.4 mmol/L — AB (ref 3.5–5.1)
SODIUM: 139 mmol/L (ref 135–145)

## 2016-08-16 LAB — COOXEMETRY PANEL
Carboxyhemoglobin: 1.5 % (ref 0.5–1.5)
Methemoglobin: 1.2 % (ref 0.0–1.5)
O2 SAT: 63.6 %
Total hemoglobin: 12 g/dL (ref 12.0–16.0)

## 2016-08-16 LAB — MAGNESIUM: Magnesium: 2.1 mg/dL (ref 1.7–2.4)

## 2016-08-16 MED ORDER — SPIRONOLACTONE 25 MG PO TABS
25.0000 mg | ORAL_TABLET | Freq: Every day | ORAL | Status: DC
  Administered 2016-08-17 – 2016-08-23 (×7): 25 mg via ORAL
  Filled 2016-08-16 (×7): qty 1

## 2016-08-16 MED ORDER — POTASSIUM CHLORIDE CRYS ER 20 MEQ PO TBCR
40.0000 meq | EXTENDED_RELEASE_TABLET | Freq: Once | ORAL | Status: AC
  Administered 2016-08-16: 40 meq via ORAL
  Filled 2016-08-16: qty 2

## 2016-08-16 MED ORDER — HEPARIN BOLUS VIA INFUSION
3000.0000 [IU] | Freq: Once | INTRAVENOUS | Status: AC
  Administered 2016-08-16: 3000 [IU] via INTRAVENOUS
  Filled 2016-08-16: qty 3000

## 2016-08-16 MED ORDER — HEPARIN (PORCINE) IN NACL 100-0.45 UNIT/ML-% IJ SOLN
2200.0000 [IU]/h | INTRAMUSCULAR | Status: DC
  Administered 2016-08-16 – 2016-08-17 (×3): 2200 [IU]/h via INTRAVENOUS
  Filled 2016-08-16 (×3): qty 250

## 2016-08-16 NOTE — Progress Notes (Signed)
ANTICOAGULATION CONSULT NOTE - Follow Up Consult  Pharmacy Consult for Heparin Indication: atrial fibrillation  Allergies  Allergen Reactions  . Codeine Other (See Comments)    agitation    Patient Measurements: Height: 6' (182.9 cm) Weight: 228 lb 3.2 oz (103.5 kg) IBW/kg (Calculated) : 77.6 Heparin Dosing Weight:  100.6 kg  Vital Signs: Temp: 97.4 F (36.3 C) (03/28 1559) Temp Source: Oral (03/28 1559) BP: 115/82 (03/28 1600) Pulse Rate: 79 (03/28 1559)  Labs:  Recent Labs  08/14/16 0523  08/15/16 0620 08/16/16 0316 08/16/16 1251 08/16/16 2029  HGB  --   --  11.1* 11.6*  --   --   HCT  --   --  36.1* 37.1*  --   --   PLT  --   --  168 185  --   --   HEPARINUNFRC  --   < > 0.22* 0.18* 0.24* 0.40  CREATININE 1.69*  --  1.78* 1.58*  --   --   < > = values in this interval not displayed.  Estimated Creatinine Clearance: 59.6 mL/min (A) (by C-G formula based on SCr of 1.58 mg/dL (H)).  Medications: Heparin @ 2200 units/hr  Assessment: 65yom continues on heparin for new afib. Heparin level 0.4 at goal.   CBC stable. No bleeding.  Goal of Therapy:  Heparin level 0.3-0.7 units/ml Monitor platelets by anticoagulation protocol: Yes   Plan:  1) Continue  heparin to 2200 units/hr 2) Check daily HL, CBC   Bonnita Nasuti Pharm.D. CPP, BCPS Clinical Pharmacist [phone] 08/16/2016 9:19 PM

## 2016-08-16 NOTE — Progress Notes (Signed)
ANTICOAGULATION CONSULT NOTE - Follow Up Consult  Pharmacy Consult for Heparin Indication: atrial fibrillation  Allergies  Allergen Reactions  . Codeine Other (See Comments)    agitation    Patient Measurements: Height: 6' (182.9 cm) Weight: 228 lb 3.2 oz (103.5 kg) IBW/kg (Calculated) : 77.6 Heparin Dosing Weight:  100.6 kg  Vital Signs: Temp: 97.4 F (36.3 C) (03/28 1156) Temp Source: Oral (03/28 1156) BP: 124/97 (03/28 1156) Pulse Rate: 129 (03/28 1156)  Labs:  Recent Labs  08/14/16 0523  08/15/16 0620 08/16/16 0316 08/16/16 1251  HGB  --   --  11.1* 11.6*  --   HCT  --   --  36.1* 37.1*  --   PLT  --   --  168 185  --   HEPARINUNFRC  --   < > 0.22* 0.18* 0.24*  CREATININE 1.69*  --  1.78* 1.58*  --   < > = values in this interval not displayed.  Estimated Creatinine Clearance: 59.6 mL/min (A) (by C-G formula based on SCr of 1.58 mg/dL (H)).  Medications: Heparin @ 2000 units/hr  Assessment: 65yom continues on heparin for new afib. Heparin level remains low at 0.24. CBC stable. No bleeding.  Goal of Therapy:  Heparin level 0.3-0.7 units/ml Monitor platelets by anticoagulation protocol: Yes   Plan:  1) Increase heparin to 2200 units/hr 2) Check 6 hour heparin level   Nena Jordan, PharmD, BCPS 08/16/2016 1:46 PM

## 2016-08-16 NOTE — Progress Notes (Signed)
Advanced Heart Failure Rounding Note  PCP/Cardiologist: [facility]  Subjective:    AHF team consulted 08/14/16 with difficult diuresis and EF 10-15%. PICC placed with concerns for low output HF.  Initial Mixed venous saturation 25%. Started on milrinone 0.375 mcg/kg/min.   Coox 63.6% on milrinone 0.375 mcg/kg/min.   Feeling somewhat better. Worried as someone came to talk to him about bills. He is concerned he wont get the same care as someone who is not [place]. Encouraged him he was getting the same care all of our patients do.  Legs remain very swollen. Having great urine output. Less orthopneic. Anxious at times.   Creatinine 1.69 -> 1.78 -> 1.58. K 3.4, Mg 2.1  CVP 6-7 (? Accuracy)  Out 4 L. Weight shows down 18 lbs but suspect yesterday weight inaccurate. Pt down 9 lbs from 08/14/16, which fits clinical picture more appropriately.   Continues to feel much better on inotrope support. Weight coming down. Co-ox looks good. Renal function improving. K low. Rhythm stable   Objective:   Weight Range: 228 lb 3.2 oz (103.5 kg) Body mass index is 30.95 kg/m.   Vital Signs:   Temp:  [97.3 F (36.3 C)-97.7 F (36.5 C)] 97.5 F (36.4 C) (03/28 0758) Pulse Rate:  [63-130] 74 (03/28 0758) Resp:  [14-27] 18 (03/28 0758) BP: (103-125)/(75-97) 121/89 (03/28 0758) SpO2:  [88 %-100 %] 100 % (03/28 0758) Weight:  [228 lb 3.2 oz (103.5 kg)] 228 lb 3.2 oz (103.5 kg) (03/28 0500) Last BM Date: 08/15/16  Weight change: Filed Weights   08/14/16 0344 08/15/16 0424 08/16/16 0500  Weight: 237 lb 4.8 oz (107.6 kg) 246 lb 1.6 oz (111.6 kg) 228 lb 3.2 oz (103.5 kg)   Intake/Output:   Intake/Output Summary (Last 24 hours) at 08/16/16 0954 Last data filed at 08/16/16 0700  Gross per 24 hour  Intake          1879.89 ml  Output             5875 ml  Net         -3995.11 ml     Physical Exam: General:  Sitting up in bed NAD HEENT: Normal, anicteric . Poor dentition  Neck: Supple. JVP appears 8  cm. Carotids 2+ bilat; no bruits. No thyromegaly or nodule noted.   Cor: PMI laterally displaced. RRR. 2/6 MR + s3 Lungs: Diminished basilar sounds with scant crackles. No wheezing Abdomen: Obese, NT, mild distention, no HSM. No bruits or masses. +BS  Extremities: no cyanosis, clubbing. 2+  edema Unna boots in place.   Neuro: Alert & oriented x 3. Cranial nerves grossly intact. Moves all 4 extremities w/o difficulty. Affect flat but appropriate.   Telemetry: NSR 70s. Personally reviewed   Labs: CBC  Recent Labs  08/15/16 0620 08/16/16 0316  WBC 7.2 8.1  HGB 11.1* 11.6*  HCT 36.1* 37.1*  MCV 86.2 85.7  PLT 168 213   Basic Metabolic Panel  Recent Labs  08/15/16 0620 08/16/16 0316 08/16/16 0456  NA 135 139  --   K 3.5 3.4*  --   CL 101 101  --   CO2 24 28  --   GLUCOSE 103* 102*  --   BUN 38* 28*  --   CREATININE 1.78* 1.58*  --   CALCIUM 8.4* 8.5*  --   MG 1.9  --  2.1   Liver Function Tests No results for input(s): AST, ALT, ALKPHOS, BILITOT, PROT, ALBUMIN in the last 72  hours. No results for input(s): LIPASE, AMYLASE in the last 72 hours. Cardiac Enzymes No results for input(s): CKTOTAL, CKMB, CKMBINDEX, TROPONINI in the last 72 hours.  BNP: BNP (last 3 results)  Recent Labs  08/12/16 1637  BNP 2,520.0*    ProBNP (last 3 results) No results for input(s): PROBNP in the last 8760 hours.   D-Dimer No results for input(s): DDIMER in the last 72 hours. Hemoglobin A1C No results for input(s): HGBA1C in the last 72 hours. Fasting Lipid Panel No results for input(s): CHOL, HDL, LDLCALC, TRIG, CHOLHDL, LDLDIRECT in the last 72 hours. Thyroid Function Tests  Recent Labs  08/13/16 1410  TSH 2.832    Other results:     Imaging/Studies:  No results found.    Medications:     Scheduled Medications: . amiodarone  150 mg Intravenous Once  . digoxin  0.125 mg Oral Daily  . furosemide  80 mg Intravenous Q8H  . nicotine  21 mg Transdermal Daily    . nystatin   Topical TID  . potassium chloride  40 mEq Oral BID  . sodium chloride flush  10-40 mL Intracatheter Q12H  . sodium chloride flush  3 mL Intravenous Q12H  . spironolactone  12.5 mg Oral Daily    Infusions: . amiodarone 30 mg/hr (08/16/16 0700)  . heparin 2,000 Units/hr (08/16/16 0700)  . milrinone 0.25 mcg/kg/min (08/16/16 0700)    PRN Medications: sodium chloride, acetaminophen, ALPRAZolam, ipratropium-albuterol, ondansetron (ZOFRAN) IV, sodium chloride flush, sodium chloride flush   Assessment/Plan   1. Cardiogenic shock due to A/C biventricular HF. - LVEF 10-15% with severe RV dysfunction 2. Acute on chronic systolic HF 3. Paroxysmal AFL vs Atrial Tachycardia 4. AKI, likely on CKD - Awaiting outside paperwork 5. Aortic root aneurysm 6. UTI 7. Tobacco abuse 8. NSVT  Marginal but improving.  Coox 63.6% on milrinone 0.375 mcg/kg/min.  Continue amiodarone for AFL/Atach.  Supp electrolytes with goal K > 4.0 and Mg > 2.0. Will increase K supp with K 3.4 this am.   CVP 6-7 but still remains markedly overloaded by exam.  ? Third spacing. Creatinine OK and pressures stable. Would continue lasix 80 mg TID at least for. Urine remains clear.   Continue unna boots.    Increase spiro 25 mg daily.    Continue digoxin 0.125 mg daily. Will need to follow closely with AKI on admit.    No Ace/ARB/ARNI for now with elevated creatinine and relatively soft pressures. No BB with low output. No change.   Not good candidate for LVAD with RV dysfunction and lack of insurance.  Not good candidate for transplant with lack of social support and recent/on-going tobacco abuse. No change.  Length of Stay: 4  Annamaria Helling  08/16/2016, 9:54 AM  Advanced Heart Failure Team Pager [phone] (M-F; 7a - 4p)  Please contact [facility] for night-coverage after hours (4p -7a ) and weekends on [email or web address]  Remains tenuous on inotropic support but improving. Renal function  and volume status improving. Co-ox 64% CVP only 6-8 range but still with marked edema. Continue IV diuresis. Increase spiro.  No ARB or b-blocker yet. Not candidate for advanced therapies so goal will be to diurese and then start to wean milrinone and titrate on hydral/nitrates.   Supp K and MAG in setting of NSVT.   Glori Bickers, MD  3:19 PM

## 2016-08-16 NOTE — Progress Notes (Signed)
ANTICOAGULATION CONSULT NOTE - Follow Up Consult  Pharmacy Consult for heparin Indication: atrial fibrillation  Labs:  Recent Labs  08/14/16 0523 08/14/16 2116 08/15/16 0620 08/16/16 0316  HGB  --   --  11.1* 11.6*  HCT  --   --  36.1* 37.1*  PLT  --   --  168 185  HEPARINUNFRC  --  0.12* 0.22* 0.18*  CREATININE 1.69*  --  1.78* 1.58*    Assessment: 65yo male remains subtherapeutic on heparin with lower heparin level despite increased rate.  Goal of Therapy:  Heparin level 0.3-0.7 units/ml   Plan:  Will give heparin bolus of 3000 units and increase gtt by 3 units/kg/hr to 2000 units/hr and check level in [place], PharmD, BCPS  08/16/2016,4:19 AM

## 2016-08-16 NOTE — Progress Notes (Signed)
   Subjective: PATIENT CONFIRMED HE DOES HAVE INSURANCE BENEFITS THROUGH THE [place]. Unclear why EMR states medicaid potential however important to distinguish that he DOES have insurance and this should not be a barrier to care.  Pt reports he is feeling better today. Breathing and leg swelling improved. Reports he didn't get much sleep overnight due to the frequent urination.   Objective:  Vital signs in last 24 hours: Vitals:   08/16/16 0615 08/16/16 0636 08/16/16 0638 08/16/16 0644  BP:  (!) 120/97    Pulse: (!) 124 (!) 130 81 80  Resp: 15 (!) 27 14 16   Temp:      TempSrc:      SpO2: 92% 95% 95% 92%  Weight:      Height:       General: Chronically-ill appearing caucasian man resting on bedside commode. In no acute distress. HENT: [place], AT. No conjunctival injection, icterus or ptosis. +JVD. Cardiovascular: RRR. No rub appreciated. Pulmonary: CTA BL. Breathing unlabored. Breath sounds diminished. Abdomen: Soft. +bowel sounds.  Extremities: BL LE with unna boots.  Psych: Mood normal and affect was mood congruent.  Echocardiogram 08/13/16: LVEF 10-15% with diffuse wall hypokinesis. Severe dilation of left ventricle.-Ventricular filling pressures appreciated. Moderately to severely dilated aortic root at 4.5 cm. Telemetry review 08/15/16: Rates much better controlled. No VTACH appreciated. Freqent PVCs. Assessment/Plan:  Principal Problem:   Acute exacerbation of congestive heart failure (HCC) Active Problems:   Tobacco use disorder   Essential hypertension   Urinary tract infection   Typical atrial flutter (HCC)   Cardiogenic shock (HCC)   AKI (acute kidney injury) ([place])  Acute on chronic combined congestive heart failure: HF team on board and we continue to appreciate their expertise in management of this pt. Net out 7 L and weight today 228 (245 admission). Saturating well on RA. Reports breathing and leg swelling have improved.  -Greatly appreciate cardiology and advanced HF team  input! -Currently on IV Lasix 80 mg TID + Milrinone -Wound care on board, rec'd unna boots which have been placed.   Atrial flutter/fib/atrial tachcyardia: Appreciate cardiology's recommendations. On Amiodarone and heparin per cards recs. Brief run of asymptomatic VTACH overnight and episode of tachycardia to the 130's while pt asleep.  -Continue telemetry   Acute on chronic renal insufficiency: Unclear baseline due to lack of records. Cr improved today at 1.58. Will need to keep close eye on this in setting of aggressive diuresis.  -BMET in AM  Urinary Tract Infection, Candidal intertrigo: On day 5/5 abx for ?uti in this poor historian. C&S with >100,000 pansensitive E.coli. Pt able to provide more history today, stating he's had increased frequency without urination.  -IV Ceftriaxone --> Oral keflex to complete a 5 day course -Nystatin powder  Anxiety: On home dose of xanax, 0.5 mg BID PRN.  Dispo: Anticipated discharge unclear. Still has significant volume to diurese.   Mykaela Arena, DO 08/16/2016, 7:32 AM Pager: [phone]

## 2016-08-16 NOTE — Progress Notes (Signed)
Patient had episode of 12 beat run of non sustained  v-tach and after 73mins interval another 15 beat run while asleep. Patient denies any symptoms. BP stable. Dr. Dallas Breeding on call was notified with orders made to draw early labs and decrease milrinone drip. Will continue to monitor pt. And follow up lab result.

## 2016-08-16 NOTE — Care Management Note (Addendum)
Case Management Note  Patient Details  Name: John Hicks MRN: [id number] Date of Birth: 18-Aug-1950  Subjective/Objective:    Pt admitted with HF                  Action/Plan:  PTA from home independent with a roommate.  Pt states he does have [facility] benefits however does not wish to return to clinic - doesn't have established relationship with PCP (per pt he has had [facility] bad experiences with [facility])  CM explained to pt the he will not be able to use [facility] pharmacy without having prescriptions written by [facility] doctor - pt acknowledged this and still insist he does not return to to the [place].  CM asked pt for [place] clinic information and at this time pt states he doesn't have any information.  CM left voicemail at [facility] for [place] requesting call back.  Pt states he would like to utilize [facility] at discharge -  CM will continue to follow     Expected Discharge Date:                  Expected Discharge Plan:  Home/Self Care  In-House Referral:     Discharge planning Services  CM Consult  Post Acute Care Choice:    Choice offered to:     DME Arranged:    DME Agency:     HH Arranged:    HH Agency:     Status of Service:  In process, will continue to follow  If discussed at Long Length of Stay Meetings, dates discussed:    Additional Comments: CM contacted [facility] clinic to inform of admit.  Pt has PCP [name] at the clinic per [facility] transfer coordinator.  CM spoke with RN CM at clinic and was informed that pt has not been seen since 2015 - however pt was recently hospitalized in [place] at the [place] in 05/2016 - CM provided [place] at [place] left voicemail.    Maryclare Labrador, RN 08/16/2016, 9:12 AM

## 2016-08-17 DIAGNOSIS — I5023 Acute on chronic systolic (congestive) heart failure: Secondary | ICD-10-CM | POA: Diagnosis not present

## 2016-08-17 DIAGNOSIS — R570 Cardiogenic shock: Secondary | ICD-10-CM | POA: Diagnosis not present

## 2016-08-17 DIAGNOSIS — N179 Acute kidney failure, unspecified: Secondary | ICD-10-CM | POA: Diagnosis not present

## 2016-08-17 LAB — COOXEMETRY PANEL
CARBOXYHEMOGLOBIN: 1.7 % — AB (ref 0.5–1.5)
Carboxyhemoglobin: 1.2 % (ref 0.5–1.5)
Methemoglobin: 0.9 % (ref 0.0–1.5)
Methemoglobin: 0.7 % (ref 0.0–1.5)
O2 SAT: 49.1 %
O2 SAT: 53.7 %
Total hemoglobin: 13 g/dL (ref 12.0–16.0)
Total hemoglobin: 13.7 g/dL (ref 12.0–16.0)

## 2016-08-17 LAB — CBC
HCT: 41.2 % (ref 39.0–52.0)
HEMOGLOBIN: 12.7 g/dL — AB (ref 13.0–17.0)
MCH: 26.6 pg (ref 26.0–34.0)
MCHC: 30.8 g/dL (ref 30.0–36.0)
MCV: 86.2 fL (ref 78.0–100.0)
Platelets: 206 10*3/uL (ref 150–400)
RBC: 4.78 MIL/uL (ref 4.22–5.81)
RDW: 17.7 % — ABNORMAL HIGH (ref 11.5–15.5)
WBC: 9 10*3/uL (ref 4.0–10.5)

## 2016-08-17 LAB — BASIC METABOLIC PANEL
ANION GAP: 9 (ref 5–15)
BUN: 22 mg/dL — ABNORMAL HIGH (ref 6–20)
CALCIUM: 8.9 mg/dL (ref 8.9–10.3)
CO2: 28 mmol/L (ref 22–32)
Chloride: 102 mmol/L (ref 101–111)
Creatinine, Ser: 1.41 mg/dL — ABNORMAL HIGH (ref 0.61–1.24)
GFR calc non Af Amer: 52 mL/min — ABNORMAL LOW (ref >60)
GFR, EST AFRICAN AMERICAN: 60 mL/min — AB (ref >60)
GLUCOSE: 105 mg/dL — AB (ref 65–99)
POTASSIUM: 3.6 mmol/L (ref 3.5–5.1)
Sodium: 139 mmol/L (ref 135–145)

## 2016-08-17 LAB — HEPARIN LEVEL (UNFRACTIONATED): Heparin Unfractionated: 0.55 IU/mL (ref 0.30–0.70)

## 2016-08-17 MED ORDER — POTASSIUM CHLORIDE CRYS ER 20 MEQ PO TBCR
40.0000 meq | EXTENDED_RELEASE_TABLET | Freq: Once | ORAL | Status: AC
  Administered 2016-08-17: 40 meq via ORAL
  Filled 2016-08-17: qty 2

## 2016-08-17 NOTE — Progress Notes (Signed)
  Date: 08/17/2016  Patient name: John Hicks  Medical record number: [id number]  Date of birth: Nov 16, 1950   This patient's plan of care was discussed with the house staff. Please see their note for complete details. I concur with their findings.  Patient was again on bedside commode urinating and having BM. His lungs are clear and his CV with tachycardia but no mgr. His legs wrapped in boots  1 Cardiogenic shock from acute on chronic biventricular HF with ef 10-15%  Greatly appreciate Cardiologys critical involvement  He remains on inotrope (milrinone), aldactone, IV lasix.  2 Atrial flutter: on amiodarone and IV heparin  COULD HE NOT BE CHANGED TO [place] [facility] TO REDUCE IVF INPUT OR AN ORAL ANTICOAGULANT ASSUMING HE WILL NEED THIS EVEN IF HE CARDIOVERTS?  Greatly appreciate CM involvement. It sounds as if he has NOT been engaged with care at [facility] in [place] (where his PCP was) and only recent contact with [place] was admission at [facility].    Truman Hayward, MD 08/17/2016, 11:05 AM

## 2016-08-17 NOTE — Progress Notes (Signed)
   Subjective: Pt was seen and evaluated today. Was on bedside commode. Feels better than on admission. Breathing and swelling continue to improve.   Objective:  Vital signs in last 24 hours: Vitals:   08/16/16 1916 08/16/16 2351 08/17/16 0330 08/17/16 0745  BP: 111/87 (!) 119/7 118/89 127/80  Pulse: 79 79 69 75  Resp:      Temp: 97.5 F (36.4 C) (!) 96.2 F (35.7 C) 97.7 F (36.5 C) 97.5 F (36.4 C)  TempSrc: Oral Axillary Oral Oral  SpO2: 98% 99% 97% 98%  Weight:   222 lb 11.2 oz (101 kg)   Height:       General: Chronically-ill appearing caucasian man resting on bedside commode. In no acute distress. HENT: [place], AT. No conjunctival injection, icterus or ptosis.  Cardiovascular: RRR. No rub appreciated. Pulmonary: CTA BL. Breathing unlabored. Breath sounds diminished. Abdomen: Soft. +bowel sounds.  Extremities: BL LE with unna boots.  Psych: Mood normal and affect was mood congruent.  Echocardiogram 08/13/16: LVEF 10-15% with diffuse wall hypokinesis. Severe dilation of left ventricle.-Ventricular filling pressures appreciated. Moderately to severely dilated aortic root at 4.5 cm.  Assessment/Plan:  Principal Problem:   Acute exacerbation of congestive heart failure (HCC) Active Problems:   Tobacco use disorder   Essential hypertension   Urinary tract infection   Typical atrial flutter (HCC)   Cardiogenic shock (HCC)   AKI (acute kidney injury) ([place])  Acute on chronic combined congestive heart failure: HF team on board and we continue to appreciate their expertise in management of this pt. Net out 10 L and weight today 222 (245 admission). Saturating well on RA. Reports breathing and leg swelling have improved.  -Greatly appreciate cardiology and advanced HF team input -Currently on IV Lasix 80 mg TID + Milrinone -Wound care on board, rec'd unna boots which have been placed.  -No ACE-I/ARB/ARNI for now given elevated cr and soft BP -No BB as pt has low cardiac  output  Atrial flutter/fib/atrial tachcyardia: Appreciate cardiology's recommendations. On Amiodarone and heparin per cards recs. Discussed with cards possibility of d/c IV heparin (attempting to reduce his fluid intake) however believe IV heparin is most appropriate in this patient and do not rec SubQ lovenox, even at afib doses.  -Continue telemetry  -Continue Heparin per cards recs  -Continue rate control with Amio per recs -GOAL K >4 and Mg >2. K 3.6 today and has been replaced orally.  -Dig 0.125 mg  Acute on chronic renal insufficiency: Unclear baseline due to lack of records. Cr 1.4 today, 1.4 on admission. Will need to keep close eye on this in setting of aggressive diuresis.  -BMET in AM  Urinary Tract Infection, Candidal intertrigo: Completed course of Abx for ?UTI. Has nystatin powder on board for candial intertrigo which is improving syx.  -Nystatin powder  Anxiety: On home dose of xanax, 0.5 mg BID PRN.  Dispo: Anticipated discharge unclear. Still has significant volume to diurese.   John Olivier, DO 08/17/2016, 11:03 AM Pager: [phone]

## 2016-08-17 NOTE — Care Management Note (Signed)
Case Management Note  Patient Details  Name: CREEK GAN MRN: [id number] Date of Birth: [birth year]  Subjective/Objective:    Pt admitted with HF                  Action/Plan:  PTA from home independent with a roommate.  Pt states he does have [facility] benefits however does not wish to return to clinic - doesn't have established relationship with PCP (per pt he has had multiple bad experiences with [facility])  CM explained to pt the he will not be able to use [facility] pharmacy without having prescriptions written by [facility] doctor - pt acknowledged this and still insist he does not return to to the [place].  CM asked pt for [place] clinic information and at this time pt states he doesn't have any information.  CM left voicemail at [facility] for [place] requesting call back.  Pt states he would like to utilize [facility] at discharge -  CM will continue to follow     Expected Discharge Date:                  Expected Discharge Plan:  Home/Self Care  In-House Referral:     Discharge planning Services  CM Consult  Post Acute Care Choice:    Choice offered to:     DME Arranged:    DME Agency:     HH Arranged:    HH Agency:     Status of Service:  In process, will continue to follow  If discussed at Long Length of Stay Meetings, dates discussed:    Additional Comments: CM informed by [name] that she is not CM nor SW for pt with the [place].  Pt was admitted to [facility] hospital earlier this year and she simply provided documentation.    Pt continues to refuse to seek medical care at [place] contacted [facility] clinic to inform of admit.  Pt has PCP [name] at the clinic per [facility] transfer coordinator.  CM spoke with RN CM at clinic and was informed that pt has not been seen since 2015 - however pt was recently hospitalized in [place] at the [place] in 05/2016 - CM provided [place] at [place] left voicemail.    Maryclare Labrador, RN 08/17/2016, 9:09 AM

## 2016-08-17 NOTE — Progress Notes (Signed)
Advanced Heart Failure Rounding Note  PCP/Cardiologist: [facility]  Subjective:    AHF team consulted 08/14/16 with difficult diuresis and EF 10-15%. PICC placed with concerns for low output HF.  Initial Mixed venous saturation 25%. Started on milrinone 0.375 mcg/kg/min.   Coox 49.1% on milrinone 0.25 mcg/kg/min.  Pt had been on 0.375 mcg/kg/min until yesterday am.   Feeling better today. Continues to have great urine output. Denies CP, SOB, lightheadedness or dizziness.   Continues to improve. Milrinone was at 0.25 and co-ox still low so increased to 0.375. Breathing better. No orthopnea or PND. Weight down about 23 pounds. Renal function improving. K low.   Creatinine 1.69 -> 1.78 -> 1.58 -> 1.41. K 3.6.  CVP 6-7  Out 2.9 L.   Objective:   Weight Range: 222 lb 11.2 oz (101 kg) Body mass index is 30.2 kg/m.   Vital Signs:   Temp:  [96.2 F (35.7 C)-97.7 F (36.5 C)] 97.5 F (36.4 C) (03/29 0745) Pulse Rate:  [69-129] 75 (03/29 0745) Resp:  [22] 22 (03/28 1156) BP: (111-127)/(7-97) 127/80 (03/29 0745) SpO2:  [92 %-99 %] 98 % (03/29 0745) Weight:  [222 lb 11.2 oz (101 kg)] 222 lb 11.2 oz (101 kg) (03/29 0330) Last BM Date: 08/16/16  Weight change: Filed Weights   08/15/16 0424 08/16/16 0500 08/17/16 0330  Weight: 246 lb 1.6 oz (111.6 kg) 228 lb 3.2 oz (103.5 kg) 222 lb 11.2 oz (101 kg)   Intake/Output:   Intake/Output Summary (Last 24 hours) at 08/17/16 0901 Last data filed at 08/17/16 0830  Gross per 24 hour  Intake          1343.03 ml  Output             4828 ml  Net         -3484.97 ml     Physical Exam: General:  Lying in bed, NAD.   HEENT: Normal, por dentition. Anicteris Neck: Supple. JVP 7 Carotids 2+ bilat; no bruits. No thyromegaly or nodule noted.   Cor: PMI laterall. RRR. 2/6 MR. + prominent s3 Lungs: clear. No wheezing   Abdomen: Obese, NT, ND, no HSM. No bruits or masses. + good bowel sounds  Extremities: no cyanosis, clubbing. 1-2+ edema.  BLE unna boots in place.  Neuro: Alert & oriented x 3. Cranial nerves grossly intact. Moves all 4 extremities w/o difficulty. Affect flat  Telemetry: SR 70-80s Personally reviewed    Labs: CBC  Recent Labs  08/16/16 0316 08/17/16 0440  WBC 8.1 9.0  HGB 11.6* 12.7*  HCT 37.1* 41.2  MCV 85.7 86.2  PLT 185 856   Basic Metabolic Panel  Recent Labs  08/15/16 0620 08/16/16 0316 08/16/16 0456 08/17/16 0440  NA 135 139  --  139  K 3.5 3.4*  --  3.6  CL 101 101  --  102  CO2 24 28  --  28  GLUCOSE 103* 102*  --  105*  BUN 38* 28*  --  22*  CREATININE 1.78* 1.58*  --  1.41*  CALCIUM 8.4* 8.5*  --  8.9  MG 1.9  --  2.1  --    Liver Function Tests No results for input(s): AST, ALT, ALKPHOS, BILITOT, PROT, ALBUMIN in the last 72 hours. No results for input(s): LIPASE, AMYLASE in the last 72 hours. Cardiac Enzymes No results for input(s): CKTOTAL, CKMB, CKMBINDEX, TROPONINI in the last 72 hours.  BNP: BNP (last 3 results)  Recent Labs  08/12/16  1637  BNP 2,520.0*    ProBNP (last 3 results) No results for input(s): PROBNP in the last 8760 hours.   D-Dimer No results for input(s): DDIMER in the last 72 hours. Hemoglobin A1C No results for input(s): HGBA1C in the last 72 hours. Fasting Lipid Panel No results for input(s): CHOL, HDL, LDLCALC, TRIG, CHOLHDL, LDLDIRECT in the last 72 hours. Thyroid Function Tests No results for input(s): TSH, T4TOTAL, T3FREE, THYROIDAB in the last 72 hours.  Invalid input(s): FREET3  Other results:     Imaging/Studies:  No results found.    Medications:     Scheduled Medications: . amiodarone  150 mg Intravenous Once  . digoxin  0.125 mg Oral Daily  . furosemide  80 mg Intravenous Q8H  . nicotine  21 mg Transdermal Daily  . nystatin   Topical TID  . potassium chloride  40 mEq Oral BID  . sodium chloride flush  10-40 mL Intracatheter Q12H  . sodium chloride flush  3 mL Intravenous Q12H  . spironolactone  25 mg  Oral Daily    Infusions: . amiodarone 30 mg/hr (08/17/16 0800)  . heparin 2,200 Units/hr (08/17/16 0800)  . milrinone 0.25 mcg/kg/min (08/17/16 0800)    PRN Medications: sodium chloride, acetaminophen, ALPRAZolam, ipratropium-albuterol, ondansetron (ZOFRAN) IV, sodium chloride flush, sodium chloride flush   Assessment/Plan   1. Cardiogenic shock due to A/C biventricular HF. - LVEF 10-15% with severe RV dysfunction 2. Acute on chronic systolic HF 3. Paroxysmal AFL vs Atrial Tachycardia 4. AKI, likely on CKD - Awaiting outside paperwork 5. Aortic root aneurysm 6. UTI 7. Tobacco abuse 8. NSVT  Coox 49.1% on milrinone 0.25 mcg/kg/min. This is how it was initially ordered in computer, but verbal order on 08/14/16 was for 0.375, so it had been running at rate of 0.375 until yesterday am.  Send stat coox now and will adjust accordingly.   SBPs stable. After repeat coox will consider hydral/imdur.   Continue amiodarone for AFL/Atach. Goal K > 4.0 and Mg > 2.0. K 3.6 this am.   CVP remains 6-7. Remains overloaded by exam peripherally. Continue IV lasix at 80 mg TID. Creatinine continues to improve.   Continue unna boots.    Continue spiro 25 mg daily.    Continue digoxin 0.125 mg daily. Will need to follow closely with AKI on admit.    No Ace/ARB/ARNI for now with elevated creatinine and relatively soft pressures. No BB with low output. No change.    Not good candidate for LVAD with RV dysfunction and lack of insurance.  Not good candidate for transplant with lack of social support and recent/on-going tobacco abuse.  No change.  Length of Stay: [place], [place]  08/17/2016, 9:01 AM  Advanced Heart Failure Team Pager [phone] (M-F; 7a - 4p)  Please contact [facility] for night-coverage after hours (4p -7a ) and weekends on [email or web address]  Patient seen and examined with Oda Kilts, PA-C. We discussed all aspects of the encounter. I agree with the assessment and  plan as stated above.   Responding very well to milrinone and IV lasix. More alert. Diuresing well. Renal function improving. CVP about 7 (checked personally) but still peripherally volume overloaded. Continue milrinone and lasix. Supp K.   He is not candidate for advanced therapies. If we cannot wean milrinone we will need to decide if he is candidate for home inotropes in the setting of Paramedicine following. Will d/w with Nurse Navigator. Continue spiro and dig. Can try to  use ARB or hydral to facilitate wean off inotropes.   Glori Bickers, MD  10:51 PM

## 2016-08-17 NOTE — Progress Notes (Signed)
ANTICOAGULATION CONSULT NOTE - Follow Up Consult  Pharmacy Consult for Heparin Indication: atrial fibrillation  Allergies  Allergen Reactions  . Codeine Other (See Comments)    agitation    Patient Measurements: Height: 6' (182.9 cm) Weight: 222 lb 11.2 oz (101 kg) IBW/kg (Calculated) : 77.6 Heparin Dosing Weight:  100.6 kg  Vital Signs: Temp: 97.5 F (36.4 C) (03/29 0745) Temp Source: Oral (03/29 0745) BP: 127/80 (03/29 0745) Pulse Rate: 75 (03/29 0745)  Labs:  Recent Labs  08/15/16 0620 08/16/16 0316 08/16/16 1251 08/16/16 2029 08/17/16 0440  HGB 11.1* 11.6*  --   --  12.7*  HCT 36.1* 37.1*  --   --  41.2  PLT 168 185  --   --  206  HEPARINUNFRC 0.22* 0.18* 0.24* 0.40 0.55  CREATININE 1.78* 1.58*  --   --  1.41*    Estimated Creatinine Clearance: 66 mL/min (A) (by C-G formula based on SCr of 1.41 mg/dL (H)).  Medications: Heparin @ 2200 units/hr  Assessment: 65yom continues on heparin for new afib. Heparin level is therapeutic at 0.55. CBC stable. No bleeding.  Goal of Therapy:  Heparin level 0.3-0.7 units/ml Monitor platelets by anticoagulation protocol: Yes   Plan:  1) Continue heparin at 2200 units/hr 2) Daily heparin level and CBC   Nena Jordan, PharmD, BCPS 08/17/2016 10:50 AM

## 2016-08-17 NOTE — Progress Notes (Signed)
  Repeat coox 53%.  Increase milrinone to 0.375 mcg/kg/min for now.    Legrand Como [address]" [place], PA-C 08/17/2016 1:28 PM

## 2016-08-18 ENCOUNTER — Encounter (HOSPITAL_COMMUNITY): Payer: Self-pay | Admitting: Internal Medicine

## 2016-08-18 DIAGNOSIS — R5383 Other fatigue: Secondary | ICD-10-CM

## 2016-08-18 DIAGNOSIS — I5043 Acute on chronic combined systolic (congestive) and diastolic (congestive) heart failure: Secondary | ICD-10-CM | POA: Diagnosis not present

## 2016-08-18 DIAGNOSIS — I480 Paroxysmal atrial fibrillation: Secondary | ICD-10-CM | POA: Diagnosis not present

## 2016-08-18 DIAGNOSIS — R570 Cardiogenic shock: Secondary | ICD-10-CM | POA: Diagnosis not present

## 2016-08-18 DIAGNOSIS — N179 Acute kidney failure, unspecified: Secondary | ICD-10-CM | POA: Diagnosis not present

## 2016-08-18 LAB — C DIFFICILE QUICK SCREEN W PCR REFLEX
C DIFFICLE (CDIFF) ANTIGEN: POSITIVE — AB
C Diff interpretation: DETECTED
C Diff toxin: POSITIVE — AB

## 2016-08-18 LAB — COOXEMETRY PANEL
CARBOXYHEMOGLOBIN: 1.2 % (ref 0.5–1.5)
METHEMOGLOBIN: 0.9 % (ref 0.0–1.5)
O2 SAT: 59.7 %
TOTAL HEMOGLOBIN: 13 g/dL (ref 12.0–16.0)

## 2016-08-18 LAB — BASIC METABOLIC PANEL
Anion gap: 10 (ref 5–15)
BUN: 19 mg/dL (ref 6–20)
CALCIUM: 9.7 mg/dL (ref 8.9–10.3)
CO2: 29 mmol/L (ref 22–32)
CREATININE: 1.38 mg/dL — AB (ref 0.61–1.24)
Chloride: 100 mmol/L — ABNORMAL LOW (ref 101–111)
GFR calc Af Amer: >60 mL/min (ref >60)
GFR, EST NON AFRICAN AMERICAN: 53 mL/min — AB (ref >60)
Glucose, Bld: 111 mg/dL — ABNORMAL HIGH (ref 65–99)
POTASSIUM: 3.7 mmol/L (ref 3.5–5.1)
SODIUM: 139 mmol/L (ref 135–145)

## 2016-08-18 LAB — CBC
HCT: 40.4 % (ref 39.0–52.0)
HEMOGLOBIN: 12.6 g/dL — AB (ref 13.0–17.0)
MCH: 26.8 pg (ref 26.0–34.0)
MCHC: 31.2 g/dL (ref 30.0–36.0)
MCV: 85.8 fL (ref 78.0–100.0)
Platelets: 201 10*3/uL (ref 150–400)
RBC: 4.71 MIL/uL (ref 4.22–5.81)
RDW: 17.5 % — ABNORMAL HIGH (ref 11.5–15.5)
WBC: 9 10*3/uL (ref 4.0–10.5)

## 2016-08-18 LAB — HEPARIN LEVEL (UNFRACTIONATED): Heparin Unfractionated: 0.7 IU/mL (ref 0.30–0.70)

## 2016-08-18 MED ORDER — VANCOMYCIN 50 MG/ML ORAL SOLUTION
125.0000 mg | Freq: Four times a day (QID) | ORAL | Status: DC
  Administered 2016-08-18 – 2016-08-23 (×22): 125 mg via ORAL
  Filled 2016-08-18 (×24): qty 2.5

## 2016-08-18 MED ORDER — AMIODARONE HCL 200 MG PO TABS
400.0000 mg | ORAL_TABLET | Freq: Two times a day (BID) | ORAL | Status: DC
  Administered 2016-08-18 – 2016-08-22 (×10): 400 mg via ORAL
  Filled 2016-08-18 (×11): qty 2

## 2016-08-18 MED ORDER — METRONIDAZOLE 500 MG PO TABS
500.0000 mg | ORAL_TABLET | Freq: Three times a day (TID) | ORAL | Status: DC
  Administered 2016-08-18 (×2): 500 mg via ORAL
  Filled 2016-08-18 (×2): qty 1

## 2016-08-18 MED ORDER — FUROSEMIDE 10 MG/ML IJ SOLN
40.0000 mg | Freq: Two times a day (BID) | INTRAMUSCULAR | Status: DC
  Administered 2016-08-18 – 2016-08-19 (×2): 40 mg via INTRAVENOUS
  Filled 2016-08-18 (×2): qty 4

## 2016-08-18 MED ORDER — LISINOPRIL 5 MG PO TABS
5.0000 mg | ORAL_TABLET | Freq: Two times a day (BID) | ORAL | Status: DC
  Administered 2016-08-18 – 2016-08-21 (×7): 5 mg via ORAL
  Filled 2016-08-18 (×7): qty 1

## 2016-08-18 MED ORDER — HEPARIN (PORCINE) IN NACL 100-0.45 UNIT/ML-% IJ SOLN
1550.0000 [IU]/h | INTRAMUSCULAR | Status: DC
  Administered 2016-08-18 (×2): 2150 [IU]/h via INTRAVENOUS
  Administered 2016-08-19: 1950 [IU]/h via INTRAVENOUS
  Administered 2016-08-19: 1700 [IU]/h via INTRAVENOUS
  Administered 2016-08-20 – 2016-08-22 (×4): 1650 [IU]/h via INTRAVENOUS
  Filled 2016-08-18 (×10): qty 250

## 2016-08-18 NOTE — Progress Notes (Signed)
Subjective: John Hicks was seen and evaluated today at bedside. He was quite fatigued. Reports he's had diarrhea since prior to admission however could not elaborate further. No abdominal pain but reports his groin hurts with abdominal palpation. Doesn't believe he was given abx during his hospitalization at the [place] in Jan.   He had not been taking his medications due to lack of refills from 2016-Jan 2018. Reports he was given a 1 month supply of meds after his d/c from the [place] however had been out since Feb.   Objective:  Vital signs in last 24 hours: Vitals:   08/18/16 0000 08/18/16 0500 08/18/16 0900 08/18/16 1010  BP:   (!) 120/92   Pulse:    70  Resp:      Temp: 97.5 F (36.4 C)  97.5 F (36.4 C)   TempSrc: Oral  Oral   SpO2:      Weight:  217 lb 14.4 oz (98.8 kg)    Height:       General: Chronically-ill appearing caucasian man resting in bed. Very fatigued appearing. In no acute distress. Often nodding off during conversation. HENT: , AT. No conjunctival injection, icterus or ptosis.  Cardiovascular: RRR. No rub appreciated. Pulmonary: CTA BL. Breathing unlabored. Breath sounds diminished. Abdomen: Soft without significant tenderness. Not distended.  Extremities: BL LE with unna boots.   Echocardiogram 08/13/16: LVEF 10-15% with diffuse wall hypokinesis. Severe dilation of left ventricle.-Ventricular filling pressures appreciated. Moderately to severely dilated aortic root at 4.5 cm.  Assessment/Plan:  Principal Problem:   Acute exacerbation of congestive heart failure (HCC) Active Problems:   Tobacco use disorder   Essential hypertension   Urinary tract infection   Typical atrial flutter (HCC)   Cardiogenic shock (HCC)   AKI (acute kidney injury) ([place])  Acute on chronic combined congestive heart failure: HF team on board and we continue to appreciate their assistance in management of this patient. Net out 12.7 L and weight today 217lbs (245 admission). Saturating  well on RA. Reports breathing and leg swelling have improved. IV lasix decreased to 40 mg BID and still remains on milrinone.  -Greatly appreciate cardiology and advanced HF team input -Currently on IV Lasix 40 mg BID + Milrinone ggt -Lisinopril 5 mg BID started today per HF -Spironolactone 25 mg started yesterday -No BB as pt has low cardiac output -Unna boots  Fatigue: Patient exceptionally fatigued today, often nodding off during examination. Last received xanax 0.5 mg last night at 2130. Medication changes since yesterday include addition of Lisinopril and flagyl for C.diff. Had ordered ABG but pt actually wide awake and lucid, saturating 98% on RA.  C. Diff colitis: +Ag, +toxin and pt has had strange stools for a few months after being d/c from [place] 05/2016. He is afebrile, without leukocytosis and does not have significant abdominal pain or distention. Received PO Flagyl overnight however have transitioned to PO Vancomycin x 2 weeks.  -PO Vancomycin (3/30 -->) -CBC in AM  Atrial flutter/fib/atrial tachcyardia: Appreciate cardiology's recommendations. On Amiodarone and heparin per cards recs. -Continue telemetry  -Continue Heparin per cards recs  -Continue rate control with Amio per recs. Amio has been transitioned to PO -GOAL K >4 and Mg >2. K 3.7 today and has been replaced orally.  -Dig 0.125 mg -Dig level tomorrow AM  Acute on chronic renal insufficiency: Unclear baseline due to lack of records. Cr 1.4 today, 1.4 on admission. Keep close eye on this in setting diuresis. -BMET in AM  Urinary  Tract Infection, Candidal intertrigo: Completed course of Abx for ?UTI. Has nystatin powder on board for candial intertrigo which is improving syx.  -Nystatin powder  Anxiety: On home dose of xanax, 0.5 mg BID PRN.  Dispo: Anticipated discharge unclear. Still has significant volume to diurese.   John Claycomb, DO 08/18/2016, 10:22 AM Pager: [phone]

## 2016-08-18 NOTE — Progress Notes (Signed)
ANTICOAGULATION CONSULT NOTE - Follow Up Consult  Pharmacy Consult for Heparin Indication: atrial fibrillation  Allergies  Allergen Reactions  . Codeine Other (See Comments)    agitation    Patient Measurements: Height: 6' (182.9 cm) Weight: 217 lb 14.4 oz (98.8 kg) IBW/kg (Calculated) : 77.6 Heparin Dosing Weight:  100.6 kg  Vital Signs: Temp: 97.5 F (36.4 C) (03/30 0900) Temp Source: Oral (03/30 0900) BP: 120/92 (03/30 0900) Pulse Rate: 70 (03/30 1010)  Labs:  Recent Labs  08/16/16 0316  08/16/16 2029 08/17/16 0440 08/18/16 0440  HGB 11.6*  --   --  12.7* 12.6*  HCT 37.1*  --   --  41.2 40.4  PLT 185  --   --  206 201  HEPARINUNFRC 0.18*  < > 0.40 0.55 0.70  CREATININE 1.58*  --   --  1.41* 1.38*  < > = values in this interval not displayed.  Estimated Creatinine Clearance: 66.7 mL/min (A) (by C-G formula based on SCr of 1.38 mg/dL (H)).  Medications: Heparin @ 2200 units/hr  Assessment: 65yom continues on heparin for new afib. Heparin level is therapeutic but at the upper end of goal 0.7 and trending up. CBC stable. No bleeding.  Goal of Therapy:  Heparin level 0.3-0.7 units/ml Monitor platelets by anticoagulation protocol: Yes   Plan:  1) Decrease heparin slightly to 2150 units/hr 2) Daily heparin level and CBC   Nena Jordan, PharmD, BCPS 08/18/2016 10:38 AM

## 2016-08-18 NOTE — Progress Notes (Signed)
RT called MD to clarify need for stat ABG. Pt alert and oriented x4, on room air with spo2 98%, in no distress, no increased wob. Per MD (phone order) ok to DC order at this time. RN aware. Rt will continue to monitor.

## 2016-08-18 NOTE — Progress Notes (Signed)
Advanced Heart Failure Rounding Note  PCP/Cardiologist: [facility]  Subjective:    AHF team consulted 08/14/16 with difficult diuresis and EF 10-15%. PICC placed with concerns for low output HF.  Initial Mixed venous saturation 25%. Started on milrinone 0.375 mcg/kg/min.   Coox 59.7% on 0.375 mcg/kg/min.    Feeling down. Having frequent BMs. C. Diff positive. Now on oral vanc. Denies SOB, orthopnea or PND. Maintaining NSR  Creatinine 1.69 -> 1.78 -> 1.58 -> 1.41 -> 1.38. K 3.7  CVP 3-4 seated on toilet.   Out 2.6 L and down another 5 lbs.   Objective:   Weight Range: 217 lb 14.4 oz (98.8 kg) Body mass index is 29.55 kg/m.   Vital Signs:   Temp:  [97.4 F (36.3 C)-97.7 F (36.5 C)] 97.5 F (36.4 C) (03/30 0900) Pulse Rate:  [70-128] 70 (03/30 1010) BP: (104-134)/(43-100) 120/92 (03/30 0900) SpO2:  [96 %-99 %] 99 % (03/29 1618) Weight:  [217 lb 14.4 oz (98.8 kg)] 217 lb 14.4 oz (98.8 kg) (03/30 0500) Last BM Date: 08/17/16  Weight change: Filed Weights   08/16/16 0500 08/17/16 0330 08/18/16 0500  Weight: 228 lb 3.2 oz (103.5 kg) 222 lb 11.2 oz (101 kg) 217 lb 14.4 oz (98.8 kg)   Intake/Output:   Intake/Output Summary (Last 24 hours) at 08/18/16 1010 Last data filed at 08/18/16 0400  Gross per 24 hour  Intake           1474.2 ml  Output             3900 ml  Net          -2425.8 ml     Physical Exam: General:  Fatigued. NAD.    HEENT: Normal Neck: Supple. JVP 6-7 cm. Carotids 2+ bilat; no bruits. No thyromegaly or nodule noted.   Cor: PMI laterall. RRR. 2/6 MR. + prominent s3 Lungs: CTAB, no crackles.    Abdomen: Obese, NT, ND, no HSM. No bruits or masses. +BS  Extremities: no cyanosis, clubbing. 2+ edema above unna boots. BLE unna boots present.   Neuro: Alert & oriented x 3. Cranial nerves grossly intact. Moves all 4 extremities w/o difficulty. Affect flat  Telemetry: Reviewed personally, NSR 70-80s   Labs: CBC  Recent Labs  08/17/16 0440  08/18/16 0440  WBC 9.0 9.0  HGB 12.7* 12.6*  HCT 41.2 40.4  MCV 86.2 85.8  PLT 206 009   Basic Metabolic Panel  Recent Labs  08/16/16 0456 08/17/16 0440 08/18/16 0440  NA  --  139 139  K  --  3.6 3.7  CL  --  102 100*  CO2  --  28 29  GLUCOSE  --  105* 111*  BUN  --  22* 19  CREATININE  --  1.41* 1.38*  CALCIUM  --  8.9 9.7  MG 2.1  --   --    Liver Function Tests No results for input(s): AST, ALT, ALKPHOS, BILITOT, PROT, ALBUMIN in the last 72 hours. No results for input(s): LIPASE, AMYLASE in the last 72 hours. Cardiac Enzymes No results for input(s): CKTOTAL, CKMB, CKMBINDEX, TROPONINI in the last 72 hours.  BNP: BNP (last 3 results)  Recent Labs  08/12/16 1637  BNP 2,520.0*    ProBNP (last 3 results) No results for input(s): PROBNP in the last 8760 hours.   D-Dimer No results for input(s): DDIMER in the last 72 hours. Hemoglobin A1C No results for input(s): HGBA1C in the last 72 hours. Fasting  Lipid Panel No results for input(s): CHOL, HDL, LDLCALC, TRIG, CHOLHDL, LDLDIRECT in the last 72 hours. Thyroid Function Tests No results for input(s): TSH, T4TOTAL, T3FREE, THYROIDAB in the last 72 hours.  Invalid input(s): FREET3  Other results:     Imaging/Studies:  No results found.    Medications:     Scheduled Medications: . amiodarone  150 mg Intravenous Once  . digoxin  0.125 mg Oral Daily  . furosemide  80 mg Intravenous Q8H  . nicotine  21 mg Transdermal Daily  . nystatin   Topical TID  . potassium chloride  40 mEq Oral BID  . sodium chloride flush  10-40 mL Intracatheter Q12H  . sodium chloride flush  3 mL Intravenous Q12H  . spironolactone  25 mg Oral Daily  . vancomycin  125 mg Oral QID    Infusions: . amiodarone 30 mg/hr (08/18/16 0800)  . heparin 2,150 Units/hr (08/18/16 0800)  . milrinone 0.375 mcg/kg/min (08/18/16 0800)    PRN Medications: sodium chloride, acetaminophen, ALPRAZolam, ipratropium-albuterol, ondansetron  (ZOFRAN) IV, sodium chloride flush, sodium chloride flush   Assessment/Plan   1. Cardiogenic shock due to A/C biventricular HF. - LVEF 10-15% with severe RV dysfunction 2. Acute on chronic systolic HF 3. Paroxysmal AFL vs Atrial Tachycardia 4. AKI, likely on CKD - Awaiting outside paperwork 5. Aortic root aneurysm 6. UTI 7. Tobacco abuse 8. NSVT 9. C. Diff colitis   Coox 59.7% on milrinone 0.375 mcg/kg/min. He is poor candidate for home so will try to wean tomorrow if stable after afterload reduction added.   SBPs relatively stable. Will add lisinopril 5 mg BID.   Continue amiodarone for AFL/Atach. Pt in NSR this am. Continue amio gtt while on amio.  Continue Goal K > 4.0 and Mg > 2.0. K 3.7 this am. Will need anticoag consideration, continue IV heparin for now. This patients CHA2DS2-VASc is at least 2, likely 3. [facility].   CVP 3-4 seated on toilet. Continues to have marked peripheral edema. Creatinine stable. With C. Diff, will decrease lasix back to 40 mg BID.   Continue unna boots.    Continue spiro 25 mg daily.    Continue digoxin 0.125 mg daily. Will need to follow closely with AKI on admit.  Check level in am.   Not good candidate for LVAD with RV dysfunction and lack of insurance.  Not good candidate for transplant with lack of social support and recent/on-going tobacco abuse.  [place] so [place] paramedicine. Will have [facility] speak with him and see if he will be able to afford home milrinone or if it can be arranged.   Length of Stay: [place], [place]  08/18/2016, 10:10 AM  Advanced Heart Failure Team Pager [phone] (M-F; 7a - 4p)  Please contact [facility] for night-coverage after hours (4p -7a ) and weekends on [email or web address]   Patient seen and examined with the above-signed Advanced Practice Provider and/or Housestaff. I personally reviewed laboratory data, imaging studies and relevant notes. I independently examined the patient and  formulated the important aspects of the plan. I have edited the note to reflect any of my changes or salient points. I have personally discussed the plan with the patient and/or family.  Remains very tenuous.   From HF perspective, co-ox improved with milrinone and restoration of NSR with amiodarone. CVP only 2-3 but still with peripheral edema. Change to po diuretics and watch closely. Renal function ok. Supp electrolytes as needed.   Now  with c. Diff. Primary team treating. Will need to be careful with diuretics in setting of diarrhea.   I had long talk with him today about how severe his HF is and his poor prognosis. [place] near the [place] line so not candidate for Paramedicine program. Unable to afford and does not have social support to handle home inotropes or advanced therapies. We wil try to wean milrinone slowly and increase afterload reduction to support but I am less than optimistic that this will be successful. I think it will be important to ask Palliative Care to see over the weekend.   Wil change amio to po. Unsure if he will be able to manage or afford anticoagulation for AF long-term.   Glori Bickers, MD  8:33 PM

## 2016-08-19 DIAGNOSIS — N179 Acute kidney failure, unspecified: Secondary | ICD-10-CM | POA: Diagnosis not present

## 2016-08-19 DIAGNOSIS — R570 Cardiogenic shock: Secondary | ICD-10-CM | POA: Diagnosis not present

## 2016-08-19 DIAGNOSIS — I480 Paroxysmal atrial fibrillation: Secondary | ICD-10-CM | POA: Diagnosis not present

## 2016-08-19 DIAGNOSIS — B372 Candidiasis of skin and nail: Secondary | ICD-10-CM

## 2016-08-19 DIAGNOSIS — I5043 Acute on chronic combined systolic (congestive) and diastolic (congestive) heart failure: Secondary | ICD-10-CM | POA: Diagnosis not present

## 2016-08-19 LAB — CBC
HEMATOCRIT: 39.3 % (ref 39.0–52.0)
HEMOGLOBIN: 12.2 g/dL — AB (ref 13.0–17.0)
MCH: 26.7 pg (ref 26.0–34.0)
MCHC: 31 g/dL (ref 30.0–36.0)
MCV: 86 fL (ref 78.0–100.0)
Platelets: 202 10*3/uL (ref 150–400)
RBC: 4.57 MIL/uL (ref 4.22–5.81)
RDW: 17.6 % — AB (ref 11.5–15.5)
WBC: 8 10*3/uL (ref 4.0–10.5)

## 2016-08-19 LAB — COOXEMETRY PANEL
Carboxyhemoglobin: 1.5 % (ref 0.5–1.5)
Methemoglobin: 1.1 % (ref 0.0–1.5)
O2 Saturation: 68.2 %
Total hemoglobin: 12.6 g/dL (ref 12.0–16.0)

## 2016-08-19 LAB — HEPARIN LEVEL (UNFRACTIONATED)
Heparin Unfractionated: 0.84 IU/mL — ABNORMAL HIGH (ref 0.30–0.70)
Heparin Unfractionated: 0.86 IU/mL — ABNORMAL HIGH (ref 0.30–0.70)

## 2016-08-19 LAB — BASIC METABOLIC PANEL
ANION GAP: 7 (ref 5–15)
BUN: 19 mg/dL (ref 6–20)
CHLORIDE: 101 mmol/L (ref 101–111)
CO2: 30 mmol/L (ref 22–32)
Calcium: 9.4 mg/dL (ref 8.9–10.3)
Creatinine, Ser: 1.39 mg/dL — ABNORMAL HIGH (ref 0.61–1.24)
GFR calc non Af Amer: 52 mL/min — ABNORMAL LOW (ref >60)
Glucose, Bld: 91 mg/dL (ref 65–99)
Potassium: 3.8 mmol/L (ref 3.5–5.1)
SODIUM: 138 mmol/L (ref 135–145)

## 2016-08-19 LAB — HEPATITIS B SURFACE ANTIGEN: HEP B S AG: NEGATIVE

## 2016-08-19 LAB — DIGOXIN LEVEL: Digoxin Level: 0.4 ng/mL — ABNORMAL LOW (ref 0.8–2.0)

## 2016-08-19 LAB — HCV COMMENT:

## 2016-08-19 LAB — LEGIONELLA PNEUMOPHILA SEROGP 1 UR AG: L. pneumophila Serogp 1 Ur Ag: NEGATIVE

## 2016-08-19 LAB — HEPATITIS C ANTIBODY (REFLEX): HCV AB: 0.1 {s_co_ratio} (ref 0.0–0.9)

## 2016-08-19 MED ORDER — FUROSEMIDE 40 MG PO TABS
60.0000 mg | ORAL_TABLET | Freq: Two times a day (BID) | ORAL | Status: DC
  Administered 2016-08-19 – 2016-08-20 (×2): 60 mg via ORAL
  Filled 2016-08-19 (×2): qty 1

## 2016-08-19 NOTE — Progress Notes (Signed)
Subjective:  John Hicks was seen at bedside. He was standing up and expressed frustration with constantly having to use the bathroom due to diuresis and diarrhea. He is frustrated that he is not able to move around much due to his IV medications. He says he [place] go [place] 66th birthday and says he is thinking about discharging himself as he feels that the beach would be more therapeutic than staying in the hospital. He understands he needs continued close medical attention for his severe heart failure and cardiac arrhythmia and agrees to stay for continued inpatient treatment. He reports continued loose stools secondary to C. Diff. He denies any fevers, chills, chest pain, SOB, or palpitations. He feels his lower extremity edema is improving.  Objective:  Vital signs in last 24 hours: Vitals:   08/19/16 0038 08/19/16 0252 08/19/16 0328 08/19/16 0700  BP: 94/60  109/69 128/85  Pulse: 71  72   Resp:   12 14  Temp: 97.3 F (36.3 C)  97.5 F (36.4 C) 97.6 F (36.4 C)  TempSrc: Oral  Oral Oral  SpO2: 94%  95%   Weight:  219 lb 12.6 oz (99.7 kg)    Height:       General: standing up by the bed, no acute distress Cardiac: RRR Pulm: clear to auscultation bilaterally, moving normal volumes of air Ext: unna boots in place Neuro: alert and oriented X3   Echocardiogram 08/13/16: LVEF 10-15% with diffuse wall hypokinesis. Severe dilation of left ventricle.-Ventricular filling pressures appreciated. Moderately to severely dilated aortic root at 4.5 cm.  Assessment/Plan:  Principal Problem:   Acute exacerbation of congestive heart failure (HCC) Active Problems:   Tobacco use disorder   Essential hypertension   Urinary tract infection   Typical atrial flutter (HCC)   Cardiogenic shock (HCC)   AKI (acute kidney injury) ([place])   PAF (paroxysmal atrial fibrillation) (HCC)  Acute on chronic combined congestive heart failure: HF team on board and we continue to  appreciate their assistance in management of this patient. Net out 15 L and weight today 219 lbs (245 admission). Saturating well on RA. Reports breathing and leg swelling have improved. On IV lasix 40 mg BID and still remains on milrinone.  - Greatly appreciate cardiology and advanced HF team input - Currently on IV Lasix 40 mg BID + Milrinone ggt - Lisinopril 5 mg po BID added on per HF - Spironolactone 25 mg po daily - No BB as pt has low cardiac output - Unna boots - Careful diuresis while having diarrhea - Strict I/Os, daily weights  C. Diff colitis: +Ag, +toxin and pt has had strange stools for a few months after being d/c from [place] 05/2016. He is afebrile, without leukocytosis and does not have significant abdominal pain or distention. Started on PO Vancomycin x 2 weeks.  - Continue PO Vancomycin 125 mg four times daily for 2 weeks (3/30 --> 4/13) - CBC in AM  Atrial flutter/fib/atrial tachcyardia: Appreciate cardiology's recommendations. On Amiodarone, Digoxin, and heparin per cards recs. -Continue telemetry  -Continue Heparin per cards recs  -Continue rate control with Amio per recs. Amio has been transitioned to PO -GOAL K >4 and Mg >2. K 3.8 today and is being replaced orally with KDur 40 mEq BID.  -Dig 0.125 mg po daily   Acute on chronic renal insufficiency: Unclear baseline due to lack of records. Cr 1.39 today, 1.4 on admission. Keep close eye on this in setting  diuresis. - Monitor BMET  Urinary Tract Infection, Candidal intertrigo: Completed course of Abx for ?UTI. Has nystatin powder for candial intertrigo. -Nystatin powder  Anxiety: On home dose of xanax, 0.5 mg BID PRN.  Dispo: Anticipated discharge unclear. Still has significant volume to diurese.   Zada Finders, MD 08/19/2016, 10:55 AM

## 2016-08-19 NOTE — Progress Notes (Signed)
ANTICOAGULATION CONSULT NOTE - Follow Up Consult  Pharmacy Consult for Heparin Indication: atrial fibrillation  Allergies  Allergen Reactions  . Codeine Other (See Comments)    agitation    Patient Measurements: Height: 6' (182.9 cm) Weight: 219 lb 12.6 oz (99.7 kg) IBW/kg (Calculated) : 77.6 Heparin Dosing Weight:  100.6 kg  Vital Signs: Temp: 97.5 F (36.4 C) (03/31 0328) Temp Source: Oral (03/31 0328) BP: 109/69 (03/31 0328) Pulse Rate: 72 (03/31 0328)  Labs:  Recent Labs  08/17/16 0440 08/18/16 0440 08/19/16 0601  HGB 12.7* 12.6* 12.2*  HCT 41.2 40.4 39.3  PLT 206 201 202  HEPARINUNFRC 0.55 0.70 0.84*  CREATININE 1.41* 1.38* 1.39*    Estimated Creatinine Clearance: 66.5 mL/min (A) (by C-G formula based on SCr of 1.39 mg/dL (H)).  Medications: Heparin @ 2200 units/hr  Assessment: 65yoM continues on heparin for new afib. Heparin level is supratherapeutic this morning at 0.84. Hgb stable, plt wnl. No S/Sx bleeding or issues with line per RN.  Goal of Therapy:  Heparin level 0.3-0.7 units/ml Monitor platelets by anticoagulation protocol: Yes   Plan:  -Decrease heparin to 1950 units/hr -Check 6-hr heparin level -Monitor heparin level, CBC, S/Sx bleeding daily  Arrie Senate, PharmD PGY-1 Pharmacy Resident Pager: [phone] 08/19/2016

## 2016-08-19 NOTE — Progress Notes (Signed)
Advanced Heart Failure Rounding Note  PCP/Cardiologist: [facility]  Subjective:    AHF team consulted 08/14/16 with difficult diuresis and EF 10-15%. PICC placed with concerns for low output HF.  Initial Mixed venous saturation 25%. Started on milrinone 0.375 mcg/kg/min.   Coox 59.7%-> 68.2%  on 0.375 mcg/kg/min.    Feels much better today. No dyspnea or orthopnea. Edema improved. CVP 5 (checked personally)  Weight at 219. (Down about 25 pounds from admit). Diarrhea resolving with treatment for c.diff. Maintaining NSR.   Creatinine 1.69 -> 1.78 -> 1.58 -> 1.41 -> 1.38.-> 1.39    Objective:   Weight Range: 99.7 kg (219 lb 12.6 oz) Body mass index is 29.81 kg/m.   Vital Signs:   Temp:  [96.6 F (35.9 C)-97.8 F (36.6 C)] 97.8 F (36.6 C) (03/31 1100) Pulse Rate:  [60-79] 72 (03/31 0328) Resp:  [12-20] 16 (03/31 1100) BP: (94-142)/(60-100) 98/82 (03/31 1100) SpO2:  [94 %-98 %] 95 % (03/31 0328) Weight:  [99.7 kg (219 lb 12.6 oz)] 99.7 kg (219 lb 12.6 oz) (03/31 0252) Last BM Date: 08/18/16  Weight change: Filed Weights   08/17/16 0330 08/18/16 0500 08/19/16 0252  Weight: 101 kg (222 lb 11.2 oz) 98.8 kg (217 lb 14.4 oz) 99.7 kg (219 lb 12.6 oz)   Intake/Output:   Intake/Output Summary (Last 24 hours) at 08/19/16 1333 Last data filed at 08/19/16 0041  Gross per 24 hour  Intake              120 ml  Output             2000 ml  Net            -1880 ml     Physical Exam: General:  Seated on bedside commode. NAD HEENT: Normal  anicteric Neck: Supple. JVP 5-6 cm. Carotids 2+ bilat; no bruits. No thyromegaly or nodule noted.   Cor: PMI laterally displaced. RRR +s3  Lungs: clear  Abdomen: Obese,nondistended  Extremities: no cyanosis, clubbing. 1+ Edema  Neuro: Alert & oriented x 3. Cranial nerves grossly intact. Moves all 4 extremities w/o difficulty. Affect pleasant   Telemetry:  NSR 70s. Personally reviewed   Labs: CBC  Recent Labs  08/18/16 0440  08/19/16 0601  WBC 9.0 8.0  HGB 12.6* 12.2*  HCT 40.4 39.3  MCV 85.8 86.0  PLT 201 193   Basic Metabolic Panel  Recent Labs  08/18/16 0440 08/19/16 0601  NA 139 138  K 3.7 3.8  CL 100* 101  CO2 29 30  GLUCOSE 111* 91  BUN 19 19  CREATININE 1.38* 1.39*  CALCIUM 9.7 9.4   Liver Function Tests No results for input(s): AST, ALT, ALKPHOS, BILITOT, PROT, ALBUMIN in the last 72 hours. No results for input(s): LIPASE, AMYLASE in the last 72 hours. Cardiac Enzymes No results for input(s): CKTOTAL, CKMB, CKMBINDEX, TROPONINI in the last 72 hours.  BNP: BNP (last 3 results)  Recent Labs  08/12/16 1637  BNP 2,520.0*    ProBNP (last 3 results) No results for input(s): PROBNP in the last 8760 hours.   D-Dimer No results for input(s): DDIMER in the last 72 hours. Hemoglobin A1C No results for input(s): HGBA1C in the last 72 hours. Fasting Lipid Panel No results for input(s): CHOL, HDL, LDLCALC, TRIG, CHOLHDL, LDLDIRECT in the last 72 hours. Thyroid Function Tests No results for input(s): TSH, T4TOTAL, T3FREE, THYROIDAB in the last 72 hours.  Invalid input(s): FREET3  Other results:  Imaging/Studies:  No results found.    Medications:     Scheduled Medications: . amiodarone  400 mg Oral BID  . digoxin  0.125 mg Oral Daily  . furosemide  40 mg Intravenous Q12H  . lisinopril  5 mg Oral BID  . nicotine  21 mg Transdermal Daily  . nystatin   Topical TID  . potassium chloride  40 mEq Oral BID  . sodium chloride flush  10-40 mL Intracatheter Q12H  . sodium chloride flush  3 mL Intravenous Q12H  . spironolactone  25 mg Oral Daily  . vancomycin  125 mg Oral QID    Infusions: . heparin 1,950 Units/hr (08/19/16 0937)  . milrinone 0.375 mcg/kg/min (08/19/16 0938)    PRN Medications: sodium chloride, acetaminophen, ALPRAZolam, ipratropium-albuterol, ondansetron (ZOFRAN) IV, sodium chloride flush, sodium chloride flush   Assessment/Plan   1.  Cardiogenic shock due to A/C biventricular HF. - LVEF 10-15% with severe RV dysfunction 2. Acute on chronic systolic HF 3. Paroxysmal AFL vs Atrial Tachycardia 4. AKI, likely on CKD - Awaiting outside paperwork 5. Aortic root aneurysm 6. UTI 7. Tobacco abuse 8. NSVT 9. C. Diff colitis    From HF perspective, co-ox improved with milrinone and restoration of NSR with amio. CVP 5. Continue po lasix. Co-ox ok. Will begin wean. He is not candidate for home inotropes. Dig level ok. Wil continue to try to optimize HF regimen and get him off IV support   Remains in NSR on po amio. Not candidate for long-term AC due to noncompliance.   C. Diff resolving. Abx per primary team   I had long talk with him yesterday about the severity of his HF   and his poor prognosis. [place] the [place] so not candidate for Paramedicine program. Unable to afford and does not have social support to handle home inotropes or advanced therapies. We wil try to wean milrinone slowly and increase afterload reduction to support but I am less than optimistic that this will be successful. He seems to have processed this but remains optimistic that he will be able to do ok at home. I will ask Palliative Care to see him as well.   Length of Stay: 7  Glori Bickers, MD  08/19/2016, 1:33 PM  Advanced Heart Failure Team Pager [phone] (M-F; 7a - 4p)  Please contact [facility] for night-coverage after hours (4p -7a ) and weekends on [email or web address]

## 2016-08-19 NOTE — Progress Notes (Addendum)
ANTICOAGULATION CONSULT NOTE - Follow Up Consult  Pharmacy Consult for Heparin Indication: atrial fibrillation  Allergies  Allergen Reactions  . Codeine Other (See Comments)    agitation    Patient Measurements: Height: 6' (182.9 cm) Weight: 219 lb 12.6 oz (99.7 kg) IBW/kg (Calculated) : 77.6 Heparin Dosing Weight:  100.6 kg  Vital Signs: Temp: 97.4 F (36.3 C) (03/31 1552) Temp Source: Oral (03/31 1552) BP: 108/95 (03/31 1552) Pulse Rate: 79 (03/31 1552)  Labs:  Recent Labs  08/17/16 0440 08/18/16 0440 08/19/16 0601 08/19/16 1407  HGB 12.7* 12.6* 12.2*  --   HCT 41.2 40.4 39.3  --   PLT 206 201 202  --   HEPARINUNFRC 0.55 0.70 0.84* 0.86*  CREATININE 1.41* 1.38* 1.39*  --    Estimated Creatinine Clearance: 66.5 mL/min (A) (by C-G formula based on SCr of 1.39 mg/dL (H)).  Medications: Heparin @ 1950 units/hr  Assessment: 65yoM continues on heparin for new afib. Heparin level remains supratherapeutic following rate decrease. Will reduce level again. No bleeding or infusion issue reported. Hgb stable, plt wnl. No S/Sx bleeding or issues with line per RN.  --Heparin level: 0.86  Goal of Therapy:  Heparin level 0.3-0.7 units/ml Monitor platelets by anticoagulation protocol: Yes   Plan:  -Decrease heparin to 1700 units/hr -Check 8-hr heparin level -Monitor heparin level, CBC, S/Sx bleeding daily  Georga Bora, PharmD Clinical Pharmacist Pager: [phone] 08/19/2016 4:37 PM

## 2016-08-20 DIAGNOSIS — N179 Acute kidney failure, unspecified: Secondary | ICD-10-CM | POA: Diagnosis not present

## 2016-08-20 DIAGNOSIS — R570 Cardiogenic shock: Secondary | ICD-10-CM | POA: Diagnosis not present

## 2016-08-20 DIAGNOSIS — I5043 Acute on chronic combined systolic (congestive) and diastolic (congestive) heart failure: Secondary | ICD-10-CM | POA: Diagnosis not present

## 2016-08-20 DIAGNOSIS — I472 Ventricular tachycardia: Secondary | ICD-10-CM

## 2016-08-20 DIAGNOSIS — I4729 Other ventricular tachycardia: Secondary | ICD-10-CM

## 2016-08-20 LAB — BASIC METABOLIC PANEL
ANION GAP: 11 (ref 5–15)
BUN: 26 mg/dL — ABNORMAL HIGH (ref 6–20)
CALCIUM: 10.3 mg/dL (ref 8.9–10.3)
CO2: 29 mmol/L (ref 22–32)
Chloride: 97 mmol/L — ABNORMAL LOW (ref 101–111)
Creatinine, Ser: 1.79 mg/dL — ABNORMAL HIGH (ref 0.61–1.24)
GFR, EST AFRICAN AMERICAN: 45 mL/min — AB (ref >60)
GFR, EST NON AFRICAN AMERICAN: 39 mL/min — AB (ref >60)
GLUCOSE: 95 mg/dL (ref 65–99)
Potassium: 4.3 mmol/L (ref 3.5–5.1)
Sodium: 137 mmol/L (ref 135–145)

## 2016-08-20 LAB — MAGNESIUM: Magnesium: 2.2 mg/dL (ref 1.7–2.4)

## 2016-08-20 LAB — CBC
HEMATOCRIT: 42.6 % (ref 39.0–52.0)
Hemoglobin: 13.2 g/dL (ref 13.0–17.0)
MCH: 26.7 pg (ref 26.0–34.0)
MCHC: 31 g/dL (ref 30.0–36.0)
MCV: 86.2 fL (ref 78.0–100.0)
PLATELETS: 223 10*3/uL (ref 150–400)
RBC: 4.94 MIL/uL (ref 4.22–5.81)
RDW: 18 % — ABNORMAL HIGH (ref 11.5–15.5)
WBC: 9.3 10*3/uL (ref 4.0–10.5)

## 2016-08-20 LAB — HEPARIN LEVEL (UNFRACTIONATED)
Heparin Unfractionated: 0.63 IU/mL (ref 0.30–0.70)
Heparin Unfractionated: 0.63 IU/mL (ref 0.30–0.70)

## 2016-08-20 LAB — COOXEMETRY PANEL
CARBOXYHEMOGLOBIN: 1.4 % (ref 0.5–1.5)
METHEMOGLOBIN: 0.9 % (ref 0.0–1.5)
O2 Saturation: 52.6 %
Total hemoglobin: 13.7 g/dL (ref 12.0–16.0)

## 2016-08-20 NOTE — Progress Notes (Signed)
ANTICOAGULATION CONSULT NOTE - Follow Up Consult  Pharmacy Consult for Heparin Indication: atrial fibrillation  Allergies  Allergen Reactions  . Codeine Other (See Comments)    agitation   Patient Measurements: Height: 6' (182.9 cm) Weight: 211 lb 8 oz (95.9 kg) IBW/kg (Calculated) : 77.6 Heparin Dosing Weight:  100.6 kg  Vital Signs: Temp: 97.9 F (36.6 C) (04/01 0736) Temp Source: Oral (04/01 0736) BP: 122/79 (04/01 0957) Pulse Rate: 80 (04/01 0300)  Labs:  Recent Labs  08/18/16 0440 08/19/16 0601 08/19/16 1407 08/20/16 0000 08/20/16 0415  HGB 12.6* 12.2*  --   --  13.2  HCT 40.4 39.3  --   --  42.6  PLT 201 202  --   --  223  HEPARINUNFRC 0.70 0.84* 0.86* 0.63 0.63  CREATININE 1.38* 1.39*  --   --  1.79*   Estimated Creatinine Clearance: 50.7 mL/min (A) (by C-G formula based on SCr of 1.79 mg/dL (H)).  Medications: Heparin @ 1950 units/hr  Assessment: 65yoM continues on heparin for new afib. Heparin level therapeutic twice overnight following rate decrease. Near the upper end of therapeutic goal, will reduce rate slightly. No bleeding or infusion issue reported. Hgb stable, plt wnl. No S/Sx bleeding or issues with line per RN.  --Heparin level: 0.63  Goal of Therapy:  Heparin level 0.3-0.7 units/ml Monitor platelets by anticoagulation protocol: Yes   Plan:  -Decrease heparin to 1650 units/hr -Daily heparin level/CBC -Monitor heparin level, CBC, S/Sx bleeding daily  Georga Bora, PharmD Clinical Pharmacist Pager: [phone] 08/20/2016 10:33 AM

## 2016-08-20 NOTE — Progress Notes (Signed)
   Subjective: Patient was evaluated this morning. He denies any shortness of breath and reports improvement in his lower extremity swelling.  He states he has had to get up and use the urinal often.  He reports continuous diarrhea throughout the day and denies any abdominal pain.  Objective:  Vital signs in last 24 hours: Vitals:   08/19/16 2006 08/19/16 2324 08/20/16 0300 08/20/16 0400  BP: 107/83 120/86 (!) 125/105 (!) 125/105  Pulse:   80   Resp:      Temp: 97.4 F (36.3 C)  97.8 F (36.6 C)   TempSrc: Oral  Oral   SpO2: 98%     Weight:   211 lb 8 oz (95.9 kg)   Height:       Physical Exam  Cardiovascular: Normal rate, regular rhythm and normal heart sounds.  Exam reveals no gallop and no friction rub.   No murmur heard. Pulmonary/Chest: Effort normal and breath sounds normal. No respiratory distress. He has no wheezes. He has no rales.  Abdominal: Soft. He exhibits no distension. There is no tenderness.  Musculoskeletal:  2+ pitting edema to the knee bilaterally, legs are wrapped     Assessment/Plan:  Principal Problem:   Acute exacerbation of congestive heart failure (HCC) Active Problems:   Tobacco use disorder   Essential hypertension   Urinary tract infection   Typical atrial flutter (HCC)   Cardiogenic shock (HCC)   AKI (acute kidney injury) ([place])   PAF (paroxysmal atrial fibrillation) (HCC)  Cardiogenic shock due to A/C biventricular HF. - LVEF 10-15% with severe RV dysfunction Heart failure team is following.  Output 1.9L overnight.  Current weight is 211lb (245lb on admission).  IV Lasix switched to Lasix 60mg  PO yesterday.   -Appreciate cardiology's recommendations -Appreciate heart failure team recommendations -Lasix PO 60 mg BID + milrinone ggt -Lisinopril 5 mg PO BID -Spironolactone 25 mg po daily - Careful diuresis due to patient having diarrhea - Strict I/Os, daily weights  Clostridium difficile colitis +Ag, +toxin.  Patient continues to be  afebrile with no leukocytosis.  Started on PO vancomycin on 3/30 and will need to continue for 2 weeks. - Continue PO Vancomycin 125 mg four times daily for 2 weeks (3/30 --> 4/13) - CBC in AM  Atrial flutter/fib/atrial tachcyardia: Appreciate cardiology's recommendations. On Amiodarone, Digoxin, and heparin per cards recs. -Continue telemetry  -Continue Heparin per cards recs  -Continue rate control with PO Amio per recs. -GOAL K >4 and Mg >2. K 4.3 today  -Dig 0.125 mg po daily  Acute on chronic renal insufficiency: Unclear baseline due to lack of records. Cr 1.79 today, 1.4 on admission. Keep close eye on this in setting diuresis.  Consider holding lisinopril if creatinine continues to increase. - Monitor BMET  Anxiety: On home dose of xanax, 0.5 mg BID PRN.  Dispo: Anticipated discharge unclear. Still has significant volume to diurese.   Valinda Party, DO 08/20/2016, 7:07 AM Pager: [phone]

## 2016-08-20 NOTE — Progress Notes (Signed)
Advanced Heart Failure Rounding Note  PCP/Cardiologist: [facility]  Subjective:    AHF team consulted 08/14/16 with difficult diuresis and EF 10-15%. PICC placed with concerns for low output HF.  Initial Mixed venous saturation 25%. Started on milrinone 0.375 mcg/kg/min.    Milrinone decreased yesterday from 0.375 to 0.25. Co-ox down to 53%. Feels ok. Denies dyspnea, orthopnea or PND. Continues to diurese.Weight down another 8 pounds. (34 pounds total) Creatinine 1.4-> 1.8. CVP 2  Still with copious  diarrhea. C. Diff +   Having occasional NSVT on tele (5-8 beats)    Coox 59.7%-> 68.2% -> 52.5%   Creatinine 1.69 -> 1.78 -> 1.58 -> 1.41 -> 1.38.-> 1.39 -? 1.79    Objective:   Weight Range: 95.9 kg (211 lb 8 oz) Body mass index is 28.68 kg/m.   Vital Signs:   Temp:  [97.4 F (36.3 C)-97.9 F (36.6 C)] 97.7 F (36.5 C) (04/01 1220) Pulse Rate:  [79-81] 81 (04/01 1220) Resp:  [18-25] 25 (04/01 1220) BP: (107-127)/(79-105) 127/83 (04/01 1220) SpO2:  [92 %-100 %] 92 % (04/01 1220) Weight:  [95.9 kg (211 lb 8 oz)] 95.9 kg (211 lb 8 oz) (04/01 0300) Last BM Date: 08/20/16  Weight change: Filed Weights   08/18/16 0500 08/19/16 0252 08/20/16 0300  Weight: 98.8 kg (217 lb 14.4 oz) 99.7 kg (219 lb 12.6 oz) 95.9 kg (211 lb 8 oz)   Intake/Output:   Intake/Output Summary (Last 24 hours) at 08/20/16 1310 Last data filed at 08/20/16 1200  Gross per 24 hour  Intake          1797.86 ml  Output             1901 ml  Net          -103.14 ml     Physical Exam: General:  Seated on side of bed . NAD HEENT: Normal  anicteric Neck: Supple. JVP flat. Carotids 2+ bilat; no bruits. No thyromegaly or nodule noted.   Cor: PMI laterally displaced. RRR + s3 Lungs: clear Abdomen: Obese, nondistended + good BS Extremities: no cyanosis, clubbing. UNNA boots in place. 2+ edema below knee. Thigh edema resolved  Neuro: Alert & oriented x 3. Cranial nerves grossly intact. Moves all 4  extremities w/o difficulty. Flat affect   Telemetry:  NSR 80s. Frequent PVCs multiple runs NSVT. Glori Bickers, MD  1:20 PM     Labs: CBC  Recent Labs  08/19/16 0601 08/20/16 0415  WBC 8.0 9.3  HGB 12.2* 13.2  HCT 39.3 42.6  MCV 86.0 86.2  PLT 202 786   Basic Metabolic Panel  Recent Labs  08/19/16 0601 08/20/16 0415  NA 138 137  K 3.8 4.3  CL 101 97*  CO2 30 29  GLUCOSE 91 95  BUN 19 26*  CREATININE 1.39* 1.79*  CALCIUM 9.4 10.3   Liver Function Tests No results for input(s): AST, ALT, ALKPHOS, BILITOT, PROT, ALBUMIN in the last 72 hours. No results for input(s): LIPASE, AMYLASE in the last 72 hours. Cardiac Enzymes No results for input(s): CKTOTAL, CKMB, CKMBINDEX, TROPONINI in the last 72 hours.  BNP: BNP (last 3 results)  Recent Labs  08/12/16 1637  BNP 2,520.0*    ProBNP (last 3 results) No results for input(s): PROBNP in the last 8760 hours.   D-Dimer No results for input(s): DDIMER in the last 72 hours. Hemoglobin A1C No results for input(s): HGBA1C in the last 72 hours. Fasting Lipid Panel No results for input(s):  CHOL, HDL, LDLCALC, TRIG, CHOLHDL, LDLDIRECT in the last 72 hours. Thyroid Function Tests No results for input(s): TSH, T4TOTAL, T3FREE, THYROIDAB in the last 72 hours.  Invalid input(s): FREET3  Other results:     Imaging/Studies:  No results found.    Medications:     Scheduled Medications: . amiodarone  400 mg Oral BID  . digoxin  0.125 mg Oral Daily  . furosemide  60 mg Oral BID  . lisinopril  5 mg Oral BID  . nicotine  21 mg Transdermal Daily  . nystatin   Topical TID  . potassium chloride  40 mEq Oral BID  . sodium chloride flush  10-40 mL Intracatheter Q12H  . sodium chloride flush  3 mL Intravenous Q12H  . spironolactone  25 mg Oral Daily  . vancomycin  125 mg Oral QID    Infusions: . heparin 1,650 Units/hr (08/20/16 1255)  . milrinone 0.25 mcg/kg/min (08/20/16 0908)    PRN  Medications: sodium chloride, acetaminophen, ALPRAZolam, ipratropium-albuterol, ondansetron (ZOFRAN) IV, sodium chloride flush, sodium chloride flush   Assessment/Plan   1. Cardiogenic shock due to A/C biventricular HF. - LVEF 10-15% with severe RV dysfunction 2. Acute on chronic systolic HF 3. Paroxysmal AFL vs Atrial Tachycardia 4. AKI, likely on CKD - Awaiting outside paperwork 5. Aortic root aneurysm 6. UTI 7. Tobacco abuse 8. NSVT 9. C. Diff colitis   Remains tenuous from HF perspective. Weight down 35 pounds. Milrinone turned down to 0.25 and co-ox back down today. May be due in part to volume depletion. (still with peripheral edema but CVP 2).  Renal function worse. Stop lasix. Continue milrinone. Follow co-ox and renal function. No b-blocker with low output.   Remains in NSR on po amio Not candidate for long-term AC due to noncompliance. Multiple PVCs and NSVT on tele. K ok. Will check mag.   On oral vanc for c. Diff per primary team.   I had long talk with him yesterday about the severity of his HF and his poor prognosis. He lives near the [place] line so not candidate for Paramedicine program. Unable to afford and does not have social support to handle home inotropes or advanced therapies. He reiterated this again today.   We wil try to wean milrinone slowly and increase afterload reduction to support but I am less than optimistic that this will be successful. May need Palliative Care to see.   Length of Stay: 8  Steffie Waggoner, Quillian Quince, MD  08/20/2016, 1:10 PM  Advanced Heart Failure Team Pager [phone] (M-F; 7a - 4p)  Please contact [facility] for night-coverage after hours (4p -7a ) and weekends on [email or web address]

## 2016-08-21 DIAGNOSIS — I420 Dilated cardiomyopathy: Secondary | ICD-10-CM

## 2016-08-21 DIAGNOSIS — I5043 Acute on chronic combined systolic (congestive) and diastolic (congestive) heart failure: Secondary | ICD-10-CM | POA: Diagnosis not present

## 2016-08-21 DIAGNOSIS — N179 Acute kidney failure, unspecified: Secondary | ICD-10-CM | POA: Diagnosis not present

## 2016-08-21 DIAGNOSIS — I480 Paroxysmal atrial fibrillation: Secondary | ICD-10-CM | POA: Diagnosis not present

## 2016-08-21 DIAGNOSIS — R570 Cardiogenic shock: Secondary | ICD-10-CM | POA: Diagnosis not present

## 2016-08-21 LAB — CBC
HEMATOCRIT: 42.5 % (ref 39.0–52.0)
HEMOGLOBIN: 13.2 g/dL (ref 13.0–17.0)
MCH: 26.6 pg (ref 26.0–34.0)
MCHC: 31.1 g/dL (ref 30.0–36.0)
MCV: 85.5 fL (ref 78.0–100.0)
Platelets: 214 10*3/uL (ref 150–400)
RBC: 4.97 MIL/uL (ref 4.22–5.81)
RDW: 18 % — ABNORMAL HIGH (ref 11.5–15.5)
WBC: 7.6 10*3/uL (ref 4.0–10.5)

## 2016-08-21 LAB — COOXEMETRY PANEL
CARBOXYHEMOGLOBIN: 2.2 % — AB (ref 0.5–1.5)
METHEMOGLOBIN: 0.7 % (ref 0.0–1.5)
O2 SAT: 64.1 %
TOTAL HEMOGLOBIN: 13.9 g/dL (ref 12.0–16.0)

## 2016-08-21 LAB — BASIC METABOLIC PANEL
Anion gap: 12 (ref 5–15)
BUN: 24 mg/dL — AB (ref 6–20)
CALCIUM: 10.5 mg/dL — AB (ref 8.9–10.3)
CHLORIDE: 98 mmol/L — AB (ref 101–111)
CO2: 28 mmol/L (ref 22–32)
CREATININE: 1.59 mg/dL — AB (ref 0.61–1.24)
GFR calc Af Amer: 52 mL/min — ABNORMAL LOW (ref >60)
GFR, EST NON AFRICAN AMERICAN: 45 mL/min — AB (ref >60)
Glucose, Bld: 90 mg/dL (ref 65–99)
Potassium: 4.3 mmol/L (ref 3.5–5.1)
Sodium: 138 mmol/L (ref 135–145)

## 2016-08-21 LAB — HEPARIN LEVEL (UNFRACTIONATED): HEPARIN UNFRACTIONATED: 0.42 [IU]/mL (ref 0.30–0.70)

## 2016-08-21 MED ORDER — LOSARTAN POTASSIUM 25 MG PO TABS
12.5000 mg | ORAL_TABLET | Freq: Two times a day (BID) | ORAL | Status: DC
  Administered 2016-08-21: 12.5 mg via ORAL
  Filled 2016-08-21: qty 1

## 2016-08-21 MED ORDER — MILRINONE LACTATE IN DEXTROSE 20-5 MG/100ML-% IV SOLN
0.1250 ug/kg/min | INTRAVENOUS | Status: DC
  Administered 2016-08-21: 0.125 ug/kg/min via INTRAVENOUS
  Filled 2016-08-21: qty 100

## 2016-08-21 NOTE — Care Management Note (Signed)
Case Management Note  Patient Details  Name: John Hicks MRN: [id number] Date of Birth: [birth year]  Subjective/Objective:    Pt admitted with HF                  Action/Plan:  PTA from home independent with a roommate.  Pt states he does have VA benefits however does not wish to return to clinic - doesn't have established relationship with PCP (per pt he has had multiple bad experiences with [facility])  CM explained to pt the he will not be able to use [facility] pharmacy without having prescriptions written by [facility] doctor - pt acknowledged this and still insist he does not return to to the [place].  CM asked pt for [place] clinic information and at this time pt states he doesn't have any information.  CM left voicemail at [facility] for [place] requesting call back.  Pt states he would like to utilize [facility] at discharge -  CM will continue to follow     Expected Discharge Date:                  Expected Discharge Plan:  Home/Self Care  In-House Referral:     Discharge planning Services  CM Consult  Post Acute Care Choice:    Choice offered to:     DME Arranged:    DME Agency:     HH Arranged:    HH Agency:     Status of Service:  In process, will continue to follow  If discussed at Long Length of Stay Meetings, dates discussed:    Additional Comments: Pt informed by [facility] that pt is being followed for home inotropic needs - however per attending note pt is likely not a candidate for home IV - plan is to wean drug off during hospital stay.  08/17/16 CM informed by [name] that she is not CM nor SW for pt with the [place].  Pt was admitted to [facility] hospital earlier this year and she simply provided documentation.    Pt continues to refuse to seek medical care at [place] contacted [facility] clinic to inform of admit.  Pt has PCP [name] at the clinic per [facility] transfer coordinator.  CM spoke with RN CM at clinic and was informed that pt has not been seen since 2015 - however pt was recently hospitalized  in [place] at the [place] in 05/2016 - CM provided [facility] [place] left voicemail.    Maryclare Labrador, RN 08/21/2016, 9:43 AM

## 2016-08-21 NOTE — Progress Notes (Signed)
   Subjective: Patient was evaluated this morning on rounds. He was sleeping comfortably when we entered. Patient states that he continues to feel better. He states that the swelling in his legs continue to improve. He also reports less frequent bowel movements although still having episodes.  He denies shortness of breath or chest pain.  Objective:  Vital signs in last 24 hours: Vitals:   08/20/16 2325 08/21/16 0347 08/21/16 0825 08/21/16 1312  BP: (!) 120/92 117/78 113/80 109/86  Pulse: 63 76 80 78  Resp: 18 (!) 22 (!) 23 16  Temp: (!) 96.9 F (36.1 C) (!) 96.4 F (35.8 C) 97.6 F (36.4 C) 97.5 F (36.4 C)  TempSrc: Axillary Axillary Oral Axillary  SpO2: 99% 93%  97%  Weight:  203 lb 6.4 oz (92.3 kg)    Height:       Physical Exam  Cardiovascular: Normal rate, regular rhythm and normal heart sounds.   Pulmonary/Chest: Effort normal and breath sounds normal. No respiratory distress. He has no wheezes. He has no rales.  Abdominal: Soft. Bowel sounds are normal. He exhibits no distension. There is no tenderness.  Musculoskeletal:  2+ pitting edema to the knees bilaterally Legs were bandaged  Skin: Skin is warm and dry.     Assessment/Plan:  Principal Problem:   Acute exacerbation of congestive heart failure (HCC) Active Problems:   Tobacco use disorder   Essential hypertension   Urinary tract infection   Typical atrial flutter (HCC)   Cardiogenic shock (HCC)   AKI (acute kidney injury) ([place])   PAF (paroxysmal atrial fibrillation) (HCC)   NSVT (nonsustained ventricular tachycardia) (HCC)  Cardiogenic shock due to A/C biventricular HF. - LVEF 10-15% with severe RV dysfunction Heart failure team is following.  Output 3.3L in past 24 hours.  Current weight is 203lb (245lb on admission).  Lasix was stopped yesterday due to bump in creatinine.   -Appreciate heart failure team recommendations -holding Lasix PO 60 mg BID -milrinone ggt, continuing to decrease - Change  Lisinopril 5 mg PO BID to Losartan 12.5mg  BID -Spironolactone 25 mg po daily - Strict I/Os, daily weights  Clostridium difficile colitis +Ag, +toxin.  Patient continues to be afebrile with no leukocytosis.  Started on PO vancomycin on 3/30 and will need to continue for 2 weeks. - Continue PO Vancomycin 125 mg four times daily for 2 weeks (3/30 -->4/13) - CBC in AM  Atrial flutter/fib/atrial tachcyardia: Appreciate cardiology's recommendations. On Amiodarone, Digoxin,and heparin per cards recs. -Continue telemetry  -Continue Heparin per cards recs  -Continue rate control with PO Amio per recs. -GOAL K >4 and Mg >2. K 4.3 today  -Dig 0.125 mg po daily  Acute on chronic renal insufficiency:Unclear baseline due to lack of records. Cr 1.59today yesterday was 1.79, 1.4 on admission. Per cardiology lisinopril was switched to losartan - Monitor BMET  Anxiety:On home dose of xanax, 0.5 mg BID PRN.  Dispo: Anticipated discharge is pending clinical improvement.   Valinda Party, DO 08/21/2016, 2:01 PM Pager: [phone]

## 2016-08-21 NOTE — Progress Notes (Signed)
ANTICOAGULATION CONSULT NOTE - Follow Up Consult  Pharmacy Consult for Heparin Indication: atrial fibrillation  Allergies  Allergen Reactions  . Codeine Other (See Comments)    agitation   Patient Measurements: Height: 6' (182.9 cm) Weight: 203 lb 6.4 oz (92.3 kg) IBW/kg (Calculated) : 77.6 Heparin Dosing Weight:  100.6 kg  Vital Signs: Temp: 97.6 F (36.4 C) (04/02 0825) Temp Source: Oral (04/02 0825) BP: 113/80 (04/02 0825) Pulse Rate: 80 (04/02 0825)  Labs:  Recent Labs  08/19/16 0601  08/20/16 0000 08/20/16 0415 08/21/16 0543 08/21/16 0544  HGB 12.2*  --   --  13.2  --  13.2  HCT 39.3  --   --  42.6  --  42.5  PLT 202  --   --  223  --  214  HEPARINUNFRC 0.84*  < > 0.63 0.63 0.42  --   CREATININE 1.39*  --   --  1.79*  --  1.59*  < > = values in this interval not displayed. Estimated Creatinine Clearance: 52.2 mL/min (A) (by C-G formula based on SCr of 1.59 mg/dL (H)).  Medications: Heparin @ 1950 units/hr  Assessment: 65yoM continues on heparin for new afib.  Heparin level at goal this am (0.4), cbc stable, no bleeding issues noted.   Goal of Therapy:  Heparin level 0.3-0.7 units/ml Monitor platelets by anticoagulation protocol: Yes   Plan:  -Continue heparin at 1650 units/hr -Daily heparin level/CBC -Monitor heparin level, CBC, S/Sx bleeding daily  Erin Hearing PharmD., BCPS Clinical Pharmacist Pager [phone] 08/21/2016 10:53 AM

## 2016-08-21 NOTE — Progress Notes (Signed)
Advanced Heart Failure Rounding Note  PCP/Cardiologist: [facility]  Subjective:    AHF team consulted 08/14/16 with difficult diuresis and EF 10-15%. PICC placed with concerns for low output HF.  Initial Mixed venous saturation 25% so milrinone was started.   Yesterday milrinone was cut back to 0.25 mcg and diuretics held. Todays CO-OX is 64%.  Overall he has diuresed 57 pounds.    Complaining of fatigue. Multiple BMs over night. Denies SOB. Tomorrow is his birthday and wants to go home.   Objective:   Weight Range: 203 lb 6.4 oz (92.3 kg) Body mass index is 27.59 kg/m.   Vital Signs:   Temp:  [96.4 F (35.8 C)-97.7 F (36.5 C)] 97.6 F (36.4 C) (04/02 0825) Pulse Rate:  [63-92] 80 (04/02 0825) Resp:  [18-25] 23 (04/02 0825) BP: (113-142)/(78-100) 113/80 (04/02 0825) SpO2:  [92 %-99 %] 93 % (04/02 0347) Weight:  [203 lb 6.4 oz (92.3 kg)] 203 lb 6.4 oz (92.3 kg) (04/02 0347) Last BM Date: 08/21/16  Weight change: Filed Weights   08/19/16 0252 08/20/16 0300 08/21/16 0347  Weight: 219 lb 12.6 oz (99.7 kg) 211 lb 8 oz (95.9 kg) 203 lb 6.4 oz (92.3 kg)   Intake/Output:   Intake/Output Summary (Last 24 hours) at 08/21/16 1059 Last data filed at 08/21/16 1000  Gross per 24 hour  Intake           591.47 ml  Output             3276 ml  Net         -2684.53 ml     Physical Exam: CVP 2 General:  NAD. Appears fatigued. In bed  HEENT: Normal.  anicteric Neck: Supple. JVP flat. Carotids 2+ bilat; no bruits. No thyromegaly or nodule noted.   Cor: PMI laterally displaced. RRR +s3 Lungs: CTAB Abdomen: Obese, NT, ND good BS Extremities: no cyanosis, clubbing. UNNA boots in place. 1+edema   Neuro: Alert & oriented x 3. Cranial nerves grossly intact. Moves all 4 extremities w/o difficulty. Flat affect   Telemetry:  NSR one episode of NSVT 80-90s Personally reviewed.      Labs: CBC  Recent Labs  08/20/16 0415 08/21/16 0544  WBC 9.3 7.6  HGB 13.2 13.2  HCT 42.6 42.5   MCV 86.2 85.5  PLT 223 638   Basic Metabolic Panel  Recent Labs  08/20/16 0415 08/20/16 1351 08/21/16 0544  NA 137  --  138  K 4.3  --  4.3  CL 97*  --  98*  CO2 29  --  28  GLUCOSE 95  --  90  BUN 26*  --  24*  CREATININE 1.79*  --  1.59*  CALCIUM 10.3  --  10.5*  MG  --  2.2  --    Liver Function Tests No results for input(s): AST, ALT, ALKPHOS, BILITOT, PROT, ALBUMIN in the last 72 hours. No results for input(s): LIPASE, AMYLASE in the last 72 hours. Cardiac Enzymes No results for input(s): CKTOTAL, CKMB, CKMBINDEX, TROPONINI in the last 72 hours.  BNP: BNP (last 3 results)  Recent Labs  08/12/16 1637  BNP 2,520.0*    ProBNP (last 3 results) No results for input(s): PROBNP in the last 8760 hours.   D-Dimer No results for input(s): DDIMER in the last 72 hours. Hemoglobin A1C No results for input(s): HGBA1C in the last 72 hours. Fasting Lipid Panel No results for input(s): CHOL, HDL, LDLCALC, TRIG, CHOLHDL, LDLDIRECT in the  last 72 hours. Thyroid Function Tests No results for input(s): TSH, T4TOTAL, T3FREE, THYROIDAB in the last 72 hours.  Invalid input(s): FREET3  Other results:     Imaging/Studies:  No results found.    Medications:     Scheduled Medications: . amiodarone  400 mg Oral BID  . digoxin  0.125 mg Oral Daily  . lisinopril  5 mg Oral BID  . nicotine  21 mg Transdermal Daily  . nystatin   Topical TID  . potassium chloride  40 mEq Oral BID  . sodium chloride flush  10-40 mL Intracatheter Q12H  . sodium chloride flush  3 mL Intravenous Q12H  . spironolactone  25 mg Oral Daily  . vancomycin  125 mg Oral QID    Infusions: . heparin 1,650 Units/hr (08/21/16 1000)  . milrinone 0.25 mcg/kg/min (08/21/16 1000)    PRN Medications: sodium chloride, acetaminophen, ALPRAZolam, ipratropium-albuterol, ondansetron (ZOFRAN) IV, sodium chloride flush, sodium chloride flush   Assessment/Plan   1. Cardiogenic shock due to A/C  biventricular HF. - LVEF 10-15% with severe RV dysfunction 2. Acute on chronic systolic HF 3. Paroxysmal AFL vs Atrial Tachycardia 4. AKI, likely on CKD - Awaiting outside paperwork 5. Aortic root aneurysm 6. UTI 7. Tobacco abuse 8. NSVT 9. C. Diff colitis   Todays Co-ox is 64%. Cut back milrinone 0.125 mcg. Volume status ok. CVP 2. Hold diuretics for now. No BB with low output. Stop lisinopril. Start losartan 12.5 mg twice a day. Possible switch to entresto on Wednesday. Continue current dose of spiro and dig. Renal function. Will need Palliative Care consult with biventricular HF.   NSVT. K 4.3 Check Mag in am.   PAF- in NSR today. Continue po amio. On heparin drip. Not a candidate for [facility] due to noncompliance.   Ongoing BMs. C Diff per primary team.   Length of Stay: [place], NP  08/21/2016, 10:59 AM  Advanced Heart Failure Team Pager [phone] (M-F; [place])  Please contact [facility] for night-coverage after hours (4p -7a ) and weekends on [email or web address]   Patient seen and examined with Darrick Grinder, NP. We discussed all aspects of the encounter. I agree with the assessment and plan as stated above.   Co-ox much improved. Will cut milrinone back. Volume status low. Will hold diuretics. His social situation prohibits home inotropes or advanced therapies. We will do our best to wean milrinone to off and get him on a suitable oral regimen. He had stopped all his medicines for months so I stressed need for him to take his meds. We will see if we can get him a 30-day supply of his HF meds.(I discussed with PharmD)  Hopefully we can get him home within 48 hours. Remains in NSR on po amio. Not candidate for [facility].  Palliative Care consult placed.   Glori Bickers, MD  10:23 PM

## 2016-08-22 DIAGNOSIS — I5043 Acute on chronic combined systolic (congestive) and diastolic (congestive) heart failure: Secondary | ICD-10-CM | POA: Diagnosis not present

## 2016-08-22 DIAGNOSIS — R570 Cardiogenic shock: Secondary | ICD-10-CM

## 2016-08-22 DIAGNOSIS — I5023 Acute on chronic systolic (congestive) heart failure: Secondary | ICD-10-CM | POA: Diagnosis not present

## 2016-08-22 DIAGNOSIS — N179 Acute kidney failure, unspecified: Secondary | ICD-10-CM

## 2016-08-22 DIAGNOSIS — Z7189 Other specified counseling: Secondary | ICD-10-CM | POA: Diagnosis not present

## 2016-08-22 DIAGNOSIS — Z1211 Encounter for screening for malignant neoplasm of colon: Secondary | ICD-10-CM

## 2016-08-22 DIAGNOSIS — Z515 Encounter for palliative care: Secondary | ICD-10-CM

## 2016-08-22 LAB — COOXEMETRY PANEL
CARBOXYHEMOGLOBIN: 1.6 % — AB (ref 0.5–1.5)
METHEMOGLOBIN: 1 % (ref 0.0–1.5)
O2 Saturation: 55.9 %
Total hemoglobin: 12.4 g/dL (ref 12.0–16.0)

## 2016-08-22 LAB — BASIC METABOLIC PANEL
ANION GAP: 11 (ref 5–15)
BUN: 23 mg/dL — ABNORMAL HIGH (ref 6–20)
CALCIUM: 10.3 mg/dL (ref 8.9–10.3)
CO2: 26 mmol/L (ref 22–32)
Chloride: 100 mmol/L — ABNORMAL LOW (ref 101–111)
Creatinine, Ser: 1.45 mg/dL — ABNORMAL HIGH (ref 0.61–1.24)
GFR, EST AFRICAN AMERICAN: 57 mL/min — AB (ref >60)
GFR, EST NON AFRICAN AMERICAN: 49 mL/min — AB (ref >60)
Glucose, Bld: 82 mg/dL (ref 65–99)
POTASSIUM: 4.5 mmol/L (ref 3.5–5.1)
Sodium: 137 mmol/L (ref 135–145)

## 2016-08-22 LAB — CBC
HCT: 41.1 % (ref 39.0–52.0)
HEMOGLOBIN: 13 g/dL (ref 13.0–17.0)
MCH: 27.3 pg (ref 26.0–34.0)
MCHC: 31.6 g/dL (ref 30.0–36.0)
MCV: 86.3 fL (ref 78.0–100.0)
Platelets: 216 10*3/uL (ref 150–400)
RBC: 4.76 MIL/uL (ref 4.22–5.81)
RDW: 18 % — ABNORMAL HIGH (ref 11.5–15.5)
WBC: 7.7 10*3/uL (ref 4.0–10.5)

## 2016-08-22 LAB — HEPARIN LEVEL (UNFRACTIONATED): Heparin Unfractionated: 0.52 IU/mL (ref 0.30–0.70)

## 2016-08-22 LAB — MAGNESIUM: MAGNESIUM: 2.5 mg/dL — AB (ref 1.7–2.4)

## 2016-08-22 MED ORDER — LOSARTAN POTASSIUM 25 MG PO TABS
25.0000 mg | ORAL_TABLET | Freq: Every day | ORAL | Status: DC
  Administered 2016-08-22 – 2016-08-23 (×2): 25 mg via ORAL
  Filled 2016-08-22 (×2): qty 1

## 2016-08-22 MED ORDER — FUROSEMIDE 40 MG PO TABS
40.0000 mg | ORAL_TABLET | Freq: Every day | ORAL | Status: DC
  Administered 2016-08-23: 40 mg via ORAL
  Filled 2016-08-22 (×2): qty 1

## 2016-08-22 MED ORDER — ALPRAZOLAM 0.5 MG PO TABS
0.5000 mg | ORAL_TABLET | Freq: Three times a day (TID) | ORAL | Status: DC | PRN
  Administered 2016-08-23: 0.5 mg via ORAL
  Filled 2016-08-22: qty 1

## 2016-08-22 NOTE — Progress Notes (Signed)
   Subjective: Patient was evaluated this morning.  He reports continued diarrhea but with decrease in episodes. He denies any shortness of breath or chest pain.  He reports continued improvement in his leg swelling.  Objective:  Vital signs in last 24 hours: Vitals:   08/21/16 1710 08/22/16 0045 08/22/16 0500 08/22/16 0801  BP: 122/89 122/86  113/84  Pulse: 70   70  Resp: 19     Temp: 97.8 F (36.6 C) 97.7 F (36.5 C)  97.4 F (36.3 C)  TempSrc: Oral Oral  Oral  SpO2: 99%     Weight:   202 lb 4.8 oz (91.8 kg)   Height:       Physical Exam  Constitutional:  Sleeping.  Appeared tired on exam  Cardiovascular: Normal rate, regular rhythm and normal heart sounds.   Pulmonary/Chest: Effort normal and breath sounds normal. No respiratory distress. He has no wheezes. He has no rales.  Abdominal: Soft. He exhibits no distension. There is no tenderness.  Musculoskeletal:  2+ pitting edema in lower extremities bilaterally to the knee  Skin: Skin is warm and dry.     Assessment/Plan:  Principal Problem:   Acute exacerbation of congestive heart failure (HCC) Active Problems:   Tobacco use disorder   Essential hypertension   Urinary tract infection   Typical atrial flutter (HCC)   Cardiogenic shock (HCC)   AKI (acute kidney injury) ([place])   PAF (paroxysmal atrial fibrillation) (HCC)   NSVT (nonsustained ventricular tachycardia) (HCC)  Cardiogenic shock due to A/C biventricular HF. - LVEF 10-15% with severe RV dysfunction Heart failure team is following. Output 1.6L in past 24 hours. Current weight is 202lb (245lb on admission). Lasix continued to be held from yesterday.  Lasix will be restarted 40 mg daily tomorrow morning. Milrinone drip has been stopped today. -Appreciate heart failure team recommendations -holding Lasix PO 60mg  BID -stopping milrinone ggt - Losartan 12.5mg  BID -Spironolactone 25 mg po daily - Strict I/Os, daily weights  Clostridium difficile  colitis +Ag, +toxin. Patient continues to be afebrile with no leukocytosis. Started on PO vancomycin on 3/30 and will need to continue for 2 weeks.  Per discussion with nurse patient has had very little diarrhea episodes in the past 24 hours. - Continue PO Vancomycin 125 mg four times daily for 2 weeks (3/30 -->4/13) - CBC in AM  Atrial flutter/fib/atrial tachcyardia: Appreciate cardiology's recommendations. On Amiodarone, Digoxin,and heparin per cards recs. -Continue telemetry  -Continue Heparin per cards recs  -Continue rate control with PO Amio per recs. -GOAL K >4 and Mg >2. K 4.5 today  -Dig 0.125 mg po daily  Acute on chronic renal insufficiency:Unclear baseline due to lack of records. Cr 1.45today yesterday was 1.59, 1.4 on admission. Lisinopril was changed to losartan yesterday.  There is continued improvement with kidney function with the holding of lasix and switching of ACE-I to ARB.   - Monitor BMET  Anxiety:On home dose of xanax, 0.5 mg BID PRN.  Dispo: Anticipated discharge is pending clinical improvement.  Patient will need appropriate follow up following discharge with a cardiologist and primary care physician.  Patient has limited resources including transportation and financial assets.  However, he is a English as a second language teacher and would need to be at least followed up with his previous [place] in [place].   Valinda Party, DO 08/22/2016, 8:27 AM Pager: [phone]

## 2016-08-22 NOTE — Progress Notes (Signed)
Advanced Heart Failure Rounding Note  PCP/Cardiologist: [facility]  Subjective:    AHF team consulted 08/14/16 with difficult diuresis and EF 10-15%. PICC placed with concerns for low output HF.  Initial Mixed venous saturation 25% so milrinone was started.   Yesterday milrinone was cut back to 0.125 mcg. Diuretics held. Today CO-OX is 56%.  Overall he has diuresed 58 pounds.    Wants to go home. Denies SOB. Wants to walk. Says he has not transportation to appointments. Limited income for medications.    Objective:   Weight Range: 202 lb 4.8 oz (91.8 kg) Body mass index is 27.44 kg/m.   Vital Signs:   Temp:  [97.4 F (36.3 C)-97.8 F (36.6 C)] 97.4 F (36.3 C) (04/03 0801) Pulse Rate:  [70-78] 70 (04/03 0801) Resp:  [16-19] 19 (04/02 1710) BP: (109-122)/(84-89) 113/84 (04/03 0801) SpO2:  [97 %-99 %] 99 % (04/02 1710) Weight:  [202 lb 4.8 oz (91.8 kg)] 202 lb 4.8 oz (91.8 kg) (04/03 0500) Last BM Date: 08/21/16  Weight change: Filed Weights   08/20/16 0300 08/21/16 0347 08/22/16 0500  Weight: 211 lb 8 oz (95.9 kg) 203 lb 6.4 oz (92.3 kg) 202 lb 4.8 oz (91.8 kg)   Intake/Output:   Intake/Output Summary (Last 24 hours) at 08/22/16 0951 Last data filed at 08/22/16 0800  Gross per 24 hour  Intake           859.68 ml  Output             1350 ml  Net          -490.32 ml     Physical Exam: CVP 5 personally reviewed  General:  NAD in the bed.  HEENT: Normal.  anicteric Neck: Supple. JVP 5-6. Carotids 2+ bilat; no bruits. No thyromegaly or nodule noted.   Cor: PMI laterally displaced. RRR + S3 no murmur Lungs: CTAB. No wheezing Abdomen: Obese, NT,ND, + BS.  Extremities: no cyanosis, clubbing. UNNA boots in place. R and LLE 1+ edema.  Neuro: A&O x3. MAE x4.    Telemetry:  NSR with PVCs 70s. Personally reviewed.       Labs: CBC  Recent Labs  08/21/16 0544 08/22/16 0617  WBC 7.6 7.7  HGB 13.2 13.0  HCT 42.5 41.1  MCV 85.5 86.3  PLT 214 956   Basic  Metabolic Panel  Recent Labs  08/20/16 1351 08/21/16 0544 08/22/16 0617  NA  --  138 137  K  --  4.3 4.5  CL  --  98* 100*  CO2  --  28 26  GLUCOSE  --  90 82  BUN  --  24* 23*  CREATININE  --  1.59* 1.45*  CALCIUM  --  10.5* 10.3  MG 2.2  --   --    Liver Function Tests No results for input(s): AST, ALT, ALKPHOS, BILITOT, PROT, ALBUMIN in the last 72 hours. No results for input(s): LIPASE, AMYLASE in the last 72 hours. Cardiac Enzymes No results for input(s): CKTOTAL, CKMB, CKMBINDEX, TROPONINI in the last 72 hours.  BNP: BNP (last 3 results)  Recent Labs  08/12/16 1637  BNP 2,520.0*    ProBNP (last 3 results) No results for input(s): PROBNP in the last 8760 hours.   D-Dimer No results for input(s): DDIMER in the last 72 hours. Hemoglobin A1C No results for input(s): HGBA1C in the last 72 hours. Fasting Lipid Panel No results for input(s): CHOL, HDL, LDLCALC, TRIG, CHOLHDL, LDLDIRECT in the  last 72 hours. Thyroid Function Tests No results for input(s): TSH, T4TOTAL, T3FREE, THYROIDAB in the last 72 hours.  Invalid input(s): FREET3  Other results:     Imaging/Studies:  No results found.    Medications:     Scheduled Medications: . amiodarone  400 mg Oral BID  . digoxin  0.125 mg Oral Daily  . losartan  12.5 mg Oral BID  . nicotine  21 mg Transdermal Daily  . nystatin   Topical TID  . potassium chloride  40 mEq Oral BID  . sodium chloride flush  10-40 mL Intracatheter Q12H  . sodium chloride flush  3 mL Intravenous Q12H  . spironolactone  25 mg Oral Daily  . vancomycin  125 mg Oral QID    Infusions: . heparin 1,650 Units/hr (08/22/16 0800)  . milrinone 0.125 mcg/kg/min (08/22/16 0800)    PRN Medications: sodium chloride, acetaminophen, ALPRAZolam, ipratropium-albuterol, ondansetron (ZOFRAN) IV, sodium chloride flush, sodium chloride flush   Assessment/Plan   1. Cardiogenic shock due to A/C biventricular HF. - LVEF 10-15% with severe  RV dysfunction 2. Acute on chronic systolic HF 3. Paroxysmal AFL vs Atrial Tachycardia 4. AKI, likely on CKD - Awaiting outside paperwork 5. Aortic root aneurysm 6. UTI 7. Tobacco abuse 8. NSVT 9. C. Diff colitis   Today CO-OX is 56%. Stop milrinone.  Volume status ok. CVP 4. Hold diuretics today and start loasix 40 mg daily in am. No BB with low output. Start losartan 25 mg daily. Would not switch to entresto as an inpatient can consider if he follows up in the HF clinic. Continue current dose of spiro and dig. Renal function. Will need Palliative Care consult with biventricular HF.   NSVT- L 4.5 Check Mag.    PAF- in NSR. Continue po amio. On heparin drip. Not a candidate for [facility] due to noncompliance.   Ongoing BMs. C Diff per primary team. On po vanc.   Long term follow up for HF will be an issue due to transportation and lack of social support. [facility] [place] [facility] and possible transportation to appointments. Limited income to pay for medications. Placed CM consult for medications and verify he can get meds from [place]. He is reluctant to return to the [place] but he may not have an option.   Consult cardiac rehab.   Length of Stay: [place], NP  08/22/2016, 9:51 AM  Advanced Heart Failure Team Pager [phone] (M-F; 7a - 4p)  Please contact [facility] for night-coverage after hours (4p -7a ) and weekends on [email or web address]  Patient seen and examined with Darrick Grinder, NP. We discussed all aspects of the encounter. I agree with the assessment and plan as stated above.   He remains tenuous but he is getting about as good as we can get him. Co-ox stable. CVP ok, Would stop milrinone.   I have discussed his case with PharmD and Hospice and his situation is nearly hopeless. He has little resources or support to help him and he is relatively uninterested in caring for himself. Says after d/c he is going to rent a car and drive to the beach.   He will be  at high risk for readmission and death.   Likely will plan d/c tomorrow if primary team agrees.   Glori Bickers, MD  9:47 PM

## 2016-08-22 NOTE — Consult Note (Signed)
Consultation Note Date: 08/22/2016   Patient Name: John Hicks  DOB: 1950/07/30  MRN: [id number]  Age / Sex: 66 y.o., male  PCP: No Pcp Per Patient Referring Physician: Annia Belt, MD  Reason for Consultation: Establishing goals of care  HPI/Patient Profile: 66 y.o. male  with past medical history of biventricular heart failure with LVEF 10-15% and severe RV dysfunction, aortic root aneurysm, HTN, and COPD. He presented to the ED with progressive lower extremity and scrotal edema. He was admitted on 08/12/2016 with acute exacerbation of end stage biventricular heart failure with resultant volume overload, as well as AKI, and C. Diff colitis. Heart failure team following, with plan to wean off milrinone prior to discharge. CM following as pt has limited financial resources, poor social support, and is reluctant to return to [place] for medications or care. Palliative consulted to assist in goals of care.  Clinical Assessment and Goals of Care: John Hicks had an excellent grasp on his health issues. He was able to explain to me that his heart was failing from not being able to pump effectively, which was causing his symptoms of shortness of breath, swelling, and fatigue. He described his heart as "more mushy then muscle," and understood that, from the medical perspective, it was not reversible and would continue to get worse. He also understood that the medications used here were not fixing his heart, but rather supporting "what little it has left to work better." That said, he continues to focus on being optimistic and pursuing options outside of "big pharma." His family has expressed interest in naturopathic remedies, and he is hopeful that his situation could improve through utilization of these options.   In talking through his hopes and expectations for the future, he is clear that quality of life is more important than life prolonging measures. He finds joy in  being at the beach with family and friends, or at the very least being at home and spending time with his cat and friend/roommate. We talked about the options moving forward, which include returning to the hospital when symptoms recur (which I reinforced they would), versus staying at home and having his symptoms managed there. He was clear that coming back and forth to the hospital for something we couldn't fix, and utilizing medications with limited long-term benefit, was not how he wanted to spend his time. Given this, I suggested utilizing Hospice support at his house as a means to ensure comfort. In talking through this option he was open to this possibility and wanted more information on what support they could provide and the associated cost. Of note, I did explain that Hospice was utilized for end of life care when we thought the person has 6 months or less to live.   Primary Decision Maker PATIENT   SUMMARY OF RECOMMENDATIONS    Full code, plan to discuss this tomorrow morning (per his request)  CM consulted for hospice referral; pt would like to talk with Hospice provider about services and cost  Will also need to ensure medications at discharge are filled prior to leaving  Code Status/Advance Care Planning:  Full code  Additional Recommendations (Limitations, Scope, Preferences):  Pt wants to discuss support and cost with Hospice  Psycho-social/Spiritual:   Desire for further Chaplaincy support:no  Additional Recommendations: Education on Hospice  Prognosis:   < 6 months. Pt with end stage biventricular heart failure, EF 10-15% and increased symptom burden.   Discharge Planning: Home with Hospice  Primary Diagnoses: Present on Admission: . Tobacco use disorder . Essential hypertension . Urinary tract infection . Typical atrial flutter ([place]) . AKI (acute kidney injury) ([place])   I have reviewed the medical record, interviewed the patient and family, and  examined the patient. The following aspects are pertinent.  Past Medical History:  Diagnosis Date  . CHF (congestive heart failure) ([place])   . Enlarged heart   . Hypertension    Social History   Social History  . Marital status: Single    Spouse name: N/A  . Number of children: N/A  . Years of education: N/A   Social History Main Topics  . Smoking status: Current Every Day Smoker    Packs/day: 0.10    Types: Cigarettes  . Smokeless tobacco: Never Used  . Alcohol use No  . Drug use: Yes    Types: Marijuana  . Sexual activity: Not Asked   Other Topics Concern  . None   Social History Narrative  . None   History reviewed. No pertinent family history. Scheduled Meds: . amiodarone  400 mg Oral BID  . digoxin  0.125 mg Oral Daily  . [START ON 08/23/2016] furosemide  40 mg Oral Daily  . losartan  25 mg Oral Daily  . nicotine  21 mg Transdermal Daily  . nystatin   Topical TID  . potassium chloride  40 mEq Oral BID  . sodium chloride flush  10-40 mL Intracatheter Q12H  . sodium chloride flush  3 mL Intravenous Q12H  . spironolactone  25 mg Oral Daily  . vancomycin  125 mg Oral QID   Continuous Infusions: . heparin 1,650 Units/hr (08/22/16 1133)   PRN Meds:.sodium chloride, acetaminophen, ALPRAZolam, ipratropium-albuterol, ondansetron (ZOFRAN) IV, sodium chloride flush, sodium chloride flush Allergies  Allergen Reactions  . Codeine Other (See Comments)    agitation   Review of Systems  Constitutional: Positive for activity change, appetite change, fatigue and unexpected weight change.  HENT: Negative for congestion, hearing loss, sinus pressure, sore throat and trouble swallowing.   Eyes: Negative for visual disturbance.  Respiratory: Positive for shortness of breath. Negative for chest tightness.   Cardiovascular: Positive for leg swelling. Negative for chest pain.  Gastrointestinal: Positive for abdominal distention and diarrhea. Negative for abdominal pain, nausea  and vomiting.  Genitourinary: Positive for frequency. Negative for difficulty urinating.  Musculoskeletal: Positive for gait problem.  Skin: Positive for pallor.  Neurological: Positive for weakness. Negative for dizziness, speech difficulty and light-headedness.  Psychiatric/Behavioral: Negative for confusion. The patient is nervous/anxious.    Physical Exam  Constitutional: He is oriented to person, place, and time. Vital signs are normal. He has a sickly appearance.  HENT:  Head: Normocephalic and atraumatic.  Mouth/Throat: Oropharynx is clear and moist. No oropharyngeal exudate.  Eyes: EOM are normal.  Neck: Normal range of motion. Neck supple.  Cardiovascular: Normal rate and regular rhythm.   Pulmonary/Chest: Effort normal. He has decreased breath sounds in the right lower field and the left lower field. He has no wheezes. He has rhonchi in the right upper field and the left upper field.  Abdominal: He exhibits distension. Bowel sounds are increased.  Musculoskeletal: Normal range of motion. He exhibits edema.  Neurological: He is alert and oriented to person, place, and time.  Skin: Skin is warm and dry. There is pallor.  Psychiatric: He has a normal mood and affect. His behavior is normal. Judgment and thought content normal.    Vital Signs: BP (!) 136/103 (BP  Location: Right Arm)   Pulse 81   Temp 97.9 F (36.6 C) (Oral)   Resp 19   Ht 6' (1.829 m)   Wt 91.8 kg (202 lb 4.8 oz)   SpO2 99%   BMI 27.44 kg/m  Pain Assessment: No/denies pain POSS *See Group Information*: 1-Acceptable,Awake and alert Pain Score: 0-No pain   SpO2: SpO2: 99 % O2 Device:SpO2: 99 %  O2 Flow Rate: .   IO: Intake/output summary:  Intake/Output Summary (Last 24 hours) at 08/22/16 1208 Last data filed at 08/22/16 0800  Gross per 24 hour  Intake              400 ml  Output             1300 ml  Net             -900 ml    LBM: Last BM Date: 08/21/16 Baseline Weight: Weight: 117.9 kg (260  lb) Most recent weight: Weight: 91.8 kg (202 lb 4.8 oz)     Palliative Assessment/Data: PPS 60%    Time Total: 70 minutes Greater than 50%  of this time was spent counseling and coordinating care related to the above assessment and plan.  Signed by: Charlynn Court, NP Palliative Medicine Team Pager # [phone] (M-F 7a-5p) Team Phone # [phone] (Nights/Weekends)

## 2016-08-22 NOTE — Care Management Note (Addendum)
Case Management Note  Patient Details  Name: John Hicks MRN: [id number] Date of Birth: 1950/09/29  Subjective/Objective:    Pt admitted with HF                  Action/Plan:  PTA from home independent with a roommate.  Pt states he does have [facility] benefits however does not wish to return to clinic - doesn't have established relationship with PCP (per pt he has had multiple bad experiences with [facility])  CM explained to pt the he will not be able to use [facility] pharmacy without having prescriptions written by [facility] doctor - pt acknowledged this and still insist he does not return to to the [place].  CM asked pt for [name] clinic information and at this time pt states he doesn't have any information.  CM left voicemail at [facility] for [place] requesting call back.  Pt states he would like to utilize [facility] at discharge -  CM will continue to follow     Expected Discharge Date:                  Expected Discharge Plan:  Home/Self Care  In-House Referral:     Discharge planning Services  CM Consult  Post Acute Care Choice:    Choice offered to:     DME Arranged:    DME Agency:     HH Arranged:    HH Agency:     Status of Service:  In process, will continue to follow  If discussed at Long Length of Stay Meetings, dates discussed:    Additional Comments: 08/22/2016  Pt has scheduled appt with [facility] clinic 08/30/16 at 10:30am.  CM informed pt and placed appt on AVS.  CM informed that pt is now off Milrinone.  [name] requested followup with [place] clinic in [place] as pt is now in agreement to return to clinic.  CM left voice mail at [facility] clinic with RN CM  [name] [phone] requesting post discharge follow up  08/21/16 Pt informed by [facility] that pt is being followed for home inotropic needs - however per attending note pt is likely not a candidate for home IV - plan is to wean drug off during hospital stay.  08/17/16 CM informed by [name] that she is not CM nor SW for pt with the [place].   Pt was admitted to [facility] earlier this year and she simply provided documentation.    Pt continues to refuse to seek medical care at [place] contacted [facility] clinic to inform of admit.  Pt has PCP [name] at the clinic per [facility] transfer coordinator.  CM spoke with RN CM at clinic and was informed that pt has not been seen since 2015 - however pt was recently hospitalized in [place] at the [place] in 05/2016 - CM provided [facility] [place] left voicemail.    Maryclare Labrador, RN 08/22/2016, 1:38 PM

## 2016-08-22 NOTE — Progress Notes (Signed)
CARDIAC REHAB PHASE I   PRE:  Rate/Rhythm: 79 SR  BP:  Supine:   Sitting: 125/92  Standing:    SaO2: 96%RA  MODE:  Ambulation: 300 ft   POST:  Rate/Rhythm: 85 SR  BP:  Supine:   Sitting: 125/97  Standing:    SaO2: 98-100%RA 1415-1505 Had one BM prior to walk. Pt stated he is not going to give up but he is going to beach after discharge to finish out his vacation. Pt stated he does not plan to return to smoking and did not want fake cigarette. Stated he is going to use nicotine patches. Discussed watching sodium and daily weights. Pt does not have scales at this time. Walked 300 ft with steady gait. Pt stated he wanted to look at more holistic treatments.   Graylon Good, RN BSN  08/22/2016 3:00 PM

## 2016-08-22 NOTE — Progress Notes (Signed)
ANTICOAGULATION CONSULT NOTE - Follow Up Consult  Pharmacy Consult for Heparin Indication: atrial fibrillation  Allergies  Allergen Reactions  . Codeine Other (See Comments)    agitation   Patient Measurements: Height: 6' (182.9 cm) Weight: 202 lb 4.8 oz (91.8 kg) IBW/kg (Calculated) : 77.6 Heparin Dosing Weight:  100.6 kg  Vital Signs: Temp: 97.4 F (36.3 C) (04/03 0801) Temp Source: Oral (04/03 0801) BP: 113/84 (04/03 0801) Pulse Rate: 70 (04/03 0801)  Labs:  Recent Labs  08/20/16 0415 08/21/16 0543 08/21/16 0544 08/22/16 0617  HGB 13.2  --  13.2 13.0  HCT 42.6  --  42.5 41.1  PLT 223  --  214 216  HEPARINUNFRC 0.63 0.42  --  0.52  CREATININE 1.79*  --  1.59* 1.45*   Estimated Creatinine Clearance: 56.5 mL/min (A) (by C-G formula based on SCr of 1.45 mg/dL (H)).  Medications: Heparin @ 1650 units/hr  Assessment: 65yoM continues on heparin for new afib.  Heparin level at goal this am (0.5), cbc stable, no bleeding issues noted.   Goal of Therapy:  Heparin level 0.3-0.7 units/ml Monitor platelets by anticoagulation protocol: Yes   Plan:  -Continue heparin at 1650 units/hr -Daily heparin level/CBC -Monitor heparin level, CBC, S/Sx bleeding daily  Erin Hearing PharmD., BCPS Clinical Pharmacist Pager [phone] 08/22/2016 9:52 AM

## 2016-08-23 DIAGNOSIS — I471 Supraventricular tachycardia: Secondary | ICD-10-CM

## 2016-08-23 DIAGNOSIS — N189 Chronic kidney disease, unspecified: Secondary | ICD-10-CM

## 2016-08-23 DIAGNOSIS — Z7189 Other specified counseling: Secondary | ICD-10-CM | POA: Diagnosis not present

## 2016-08-23 DIAGNOSIS — Z515 Encounter for palliative care: Secondary | ICD-10-CM | POA: Diagnosis not present

## 2016-08-23 DIAGNOSIS — I5043 Acute on chronic combined systolic (congestive) and diastolic (congestive) heart failure: Secondary | ICD-10-CM | POA: Diagnosis not present

## 2016-08-23 DIAGNOSIS — F17200 Nicotine dependence, unspecified, uncomplicated: Secondary | ICD-10-CM

## 2016-08-23 DIAGNOSIS — I480 Paroxysmal atrial fibrillation: Secondary | ICD-10-CM

## 2016-08-23 DIAGNOSIS — F419 Anxiety disorder, unspecified: Secondary | ICD-10-CM

## 2016-08-23 DIAGNOSIS — I5082 Biventricular heart failure: Secondary | ICD-10-CM

## 2016-08-23 DIAGNOSIS — Z885 Allergy status to narcotic agent status: Secondary | ICD-10-CM

## 2016-08-23 DIAGNOSIS — I130 Hypertensive heart and chronic kidney disease with heart failure and stage 1 through stage 4 chronic kidney disease, or unspecified chronic kidney disease: Principal | ICD-10-CM

## 2016-08-23 DIAGNOSIS — A0472 Enterocolitis due to Clostridium difficile, not specified as recurrent: Secondary | ICD-10-CM

## 2016-08-23 LAB — CBC
HEMATOCRIT: 42.2 % (ref 39.0–52.0)
HEMOGLOBIN: 13.1 g/dL (ref 13.0–17.0)
MCH: 26.8 pg (ref 26.0–34.0)
MCHC: 31 g/dL (ref 30.0–36.0)
MCV: 86.3 fL (ref 78.0–100.0)
PLATELETS: 208 10*3/uL (ref 150–400)
RBC: 4.89 MIL/uL (ref 4.22–5.81)
RDW: 18 % — ABNORMAL HIGH (ref 11.5–15.5)
WBC: 8.9 10*3/uL (ref 4.0–10.5)

## 2016-08-23 LAB — HEPARIN LEVEL (UNFRACTIONATED): Heparin Unfractionated: 0.77 IU/mL — ABNORMAL HIGH (ref 0.30–0.70)

## 2016-08-23 LAB — BASIC METABOLIC PANEL
Anion gap: 8 (ref 5–15)
BUN: 27 mg/dL — ABNORMAL HIGH (ref 6–20)
CHLORIDE: 102 mmol/L (ref 101–111)
CO2: 27 mmol/L (ref 22–32)
CREATININE: 1.51 mg/dL — AB (ref 0.61–1.24)
Calcium: 10.3 mg/dL (ref 8.9–10.3)
GFR calc non Af Amer: 47 mL/min — ABNORMAL LOW (ref >60)
GFR, EST AFRICAN AMERICAN: 55 mL/min — AB (ref >60)
Glucose, Bld: 97 mg/dL (ref 65–99)
Potassium: 4.8 mmol/L (ref 3.5–5.1)
Sodium: 137 mmol/L (ref 135–145)

## 2016-08-23 LAB — COOXEMETRY PANEL
Carboxyhemoglobin: 1.9 % — ABNORMAL HIGH (ref 0.5–1.5)
Methemoglobin: 0.6 % (ref 0.0–1.5)
O2 SAT: 71.4 %
Total hemoglobin: 15.3 g/dL (ref 12.0–16.0)

## 2016-08-23 MED ORDER — AMIODARONE HCL 200 MG PO TABS: 200.0000 mg | ORAL_TABLET | Freq: Every day | ORAL | 0 refills | Status: DC

## 2016-08-23 MED ORDER — VANCOMYCIN HCL 125 MG PO CAPS: 125.0000 mg | ORAL_CAPSULE | Freq: Four times a day (QID) | ORAL | 0 refills | Status: DC

## 2016-08-23 MED ORDER — FUROSEMIDE 40 MG PO TABS: 40.0000 mg | ORAL_TABLET | Freq: Every day | ORAL | 0 refills | Status: DC

## 2016-08-23 MED ORDER — LOSARTAN POTASSIUM 25 MG PO TABS: 25.0000 mg | ORAL_TABLET | Freq: Every day | ORAL | 0 refills | Status: DC

## 2016-08-23 MED ORDER — AMIODARONE HCL 200 MG PO TABS
200.0000 mg | ORAL_TABLET | Freq: Every day | ORAL | Status: DC
  Administered 2016-08-23: 200 mg via ORAL
  Filled 2016-08-23: qty 1

## 2016-08-23 MED FILL — LOSARTAN POTASSIUM 25 MG TA: 25 | 30 days supply | Qty: 30 | Fill #0

## 2016-08-23 MED FILL — FUROSEMIDE 40 MG TABLET: 40 | 30 days supply | Qty: 30 | Fill #0

## 2016-08-23 MED FILL — VANCOMYCIN HCL 125 MG CAP: 125 | 5 days supply | Qty: 18 | Fill #0

## 2016-08-23 MED FILL — AMIODARONE HCL 200 MG TAB: 200 | 30 days supply | Qty: 30 | Fill #0

## 2016-08-23 NOTE — Progress Notes (Signed)
Palliative Care  In terms of discharge planning, John Hicks and I talked about the goal of ensuring adequate support and care. His preferences are detailed below and the plan of action to explore them:  1. He would like to be at a residential Hospice facility near [facility]. The closest option I could find was [facility]. They have a $95/day charge, which he could not afford. They do, however, have a financial aid application. If approved, the fee would be waived and he would go on a waiting list for the next available bed. The timeline for application approval, wait-list, then available bed is unknown. SW consulted to assist him in the application.  2. In the interim of the working through option #1, I have spoken with CM about setting up Hospice at his home. Since he has insurance/benefits through the [place], they need to approve and coordinate home Hospice. CM has already spoken with them and requested Home Hospice, as well as investigating residential options through the [place] closer to the beach. The timeline for that process is unclear, but may take a few days. The VA will contact John Hicks directly as he will likely be discharged at that point.   PLAN -CM working with [place] about establishing Hospice at Home or at a facility closer to [employer]  -SW consulted to assist in [place] discharge medications discussed with Heart Failure Team, CM notified of need for these to be filled prior to [place] leaving the hospital. She is exploring cost and will work with Shanon Brow on how to obtain these (CM cannot fill them directly as pt does have insurance. He is just VERY unlikely/unreliable to fill them after discharge as his plans are to spend three weeks at the beach ASAP after discharge)   **As long as John Hicks has his medications filled, I do not believe discharge needs to be held-up waiting on the [place] to coordinate Hospice care. They will contact him directly, which he is  aware of. I have also provided him with the contact information for [facility]. If SW is not able to help him with the financial aid application prior to discharge, he has their information so he could pursue this after discharge (qualifying diagnosis paperwork would be available through his [place] provider, which he has an appointment already scheduled with).    Charlynn Court [facility] Palliative Care [phone]  No charge note--clarification on 'Plan' in primary Progress note.

## 2016-08-23 NOTE — Care Management Note (Addendum)
Case Management Note  Patient Details  Name: John Hicks MRN: [id number] Date of Birth: October 26, 1950  Subjective/Objective:    Pt admitted with HF                  Action/Plan:  PTA from home independent with a roommate.  Pt states he does have VA benefits however does not wish to return to clinic - doesn't have established relationship with PCP (per pt he has had multiple bad experiences with [facility])  CM explained to pt the he will not be able to use VA pharmacy without having prescriptions written by [facility] doctor - pt acknowledged this and still insist he does not return to to the [place].  CM asked pt for [place] clinic information and at this time pt states he doesn't have any information.  CM left voicemail at [facility] for [facility] requesting call back.  Pt states he would like to utilize [facility] at discharge -  CM will continue to follow     Expected Discharge Date:                  Expected Discharge Plan:  Home/Self Care  In-House Referral:     Discharge planning Services  CM Consult  Post Acute Care Choice:    Choice offered to:     DME Arranged:    DME Agency:     HH Arranged:    HH Agency:     Status of Service:  In process, will continue to follow  If discussed at Long Length of Stay Meetings, dates discussed:    Additional Comments: 08/23/2016  CM contacted by [facility] - they are requiring pt to do face to face before any referrals will be completed due to hx of lack of compliance - pt has not been seen in clinic since 2015.  CM spoke directly with pt and informed pt that he must attend appt set up for next week to get requested hospice services.  CM also informed pt that medicines will be provided free of charge - if discharge medicines are not provided to pt prior to discharge pt needs to pick them up immediately following discharge - CM provided location of outpt pharmacy on AVS.  VA verified that they received requested documents.  [facility] will provide discharge medications free  of charge - IM residents will ensure discharge prescriptions are faxed to pharmacy and will arrange pick up of medication prior to discharge and delivery to pts room.  CM faxed clinical to [facility] as requested [phone]  CM received consult from Palliative to arrange hospice for home/ residential hospice.  CM contacted [facility] -  Was then connected with [name] at the [place] clinic - verbal referral for Hospice in the home /residential hospice has been accepted by [facility] and has been submitted to the pts primary doctor for review - if the PCP agrees he will then contact the [place] and will follow up directly with pt.  Pt has been deemed stable for discharge home awaiting VA services to be arranged.  VA will continue to work on this referral.  08/22/16 Pt has scheduled appt with [facility] clinic 08/30/16 at 10:30am.  CM informed pt and placed appt on AVS.  CM informed that pt is now off Milrinone.  [name] requested followup with [place] clinic in [place] as pt is now in agreement to return to clinic.  CM left voice mail at [facility] clinic with  RN CM  [name] [phone] requesting post discharge follow up  08/21/16 Pt informed by [facility] that pt is being followed for home inotropic needs - however per attending note pt is likely not a candidate for home IV - plan is to wean drug off during hospital stay.  08/17/16 CM informed by [name] that she is not CM nor SW for pt with the [place].  Pt was admitted to [facility] hospital earlier this year and she simply provided documentation.    Pt continues to refuse to seek medical care at [place] contacted [facility] clinic to inform of admit.  Pt has PCP [name] at the clinic per [facility] transfer coordinator.  CM spoke with RN CM at clinic and was informed that pt has not been seen since 2015 - however pt was recently hospitalized in [place] at the [place] in 05/2016 - CM provided [place] at [place] left  voicemail.    Maryclare Labrador, RN 08/23/2016, 9:06 AM

## 2016-08-23 NOTE — Progress Notes (Signed)
   Subjective: Patient was evaluated this morning on rounds. He reports continued improvement in his symptoms. He states that his diarrhea has significantly improved.  He states he discussed hospice with palliative and is agreeable to hospice care.  Objective:  Vital signs in last 24 hours: Vitals:   08/23/16 0300 08/23/16 0400 08/23/16 0500 08/23/16 0805  BP:  (!) 118/100  119/89  Pulse:    69  Resp:      Temp: 97.5 F (36.4 C)     TempSrc: Oral   Oral  SpO2: 99%     Weight:   208 lb 11.2 oz (94.7 kg)   Height:       Physical Exam  Cardiovascular: Normal rate, regular rhythm and normal heart sounds.   Pulmonary/Chest: Effort normal and breath sounds normal.  Abdominal: Soft.  Musculoskeletal:  2+ pitting edema to knees  Skin: Skin is warm and dry.     Assessment/Plan:  Principal Problem:   Acute exacerbation of congestive heart failure (HCC) Active Problems:   Tobacco use disorder   Essential hypertension   Urinary tract infection   Typical atrial flutter (HCC)   Cardiogenic shock (HCC)   AKI (acute kidney injury) ([place])   PAF (paroxysmal atrial fibrillation) (HCC)   NSVT (nonsustained ventricular tachycardia) (HCC)   Goals of care, counseling/discussion   Palliative care by specialist  Cardiogenic shock due to A/C biventricular HF. - LVEF 10-15% with severe RV dysfunction Heart failure team is following. Current weight is 208lb (245lb on admission). Lasix was restarted at 40 mg this morning.  -Appreciate heart failure team recommendations - Lasix PO 40mg   - Losartan 25mg  daily - Spironolactone 25 mg po daily - Strict I/Os, daily weights  Patient will be discharged with losartan 25mg  daily and lasix po 40mg    Clostridium difficile colitis +Ag, +toxin. Started on PO vancomycin on 3/30 and will need to continue for 4 more days for a complete 10 day course.   patient reports significant improvement in diarrhea symptoms - Continue PO Vancomycin 125 mg four  times daily for 4 more days (end date 4/8)  Atrial flutter/fib/atrial tachcyardia: Appreciate cardiology's recommendations.  Patient is in normal sinus rhythm. Heparin was stopped today. -Continue telemetry  -Continue rate control with PO Amio per recs. -GOAL K >4 and Mg >2. K 4.8 today  -Dig 0.125 mg po daily  Patient will be discharged with amiodarone 200mg  daily  Acute on chronic renal insufficiency:Unclear baseline due to lack of records. Cr 1.5, 1.4 on admission.    Anxiety:On home dose of xanax, 0.5 mg BID PRN.  Dispo: Anticipated discharge today.  Appreciate palliative care involvement with care.  Since patient has insurance/benefits patient will [facility] up with [facility] for medical management and to coordinate home hospice.  The VA will contact the patient directly.    Valinda Party, DO 08/23/2016, 11:31 AM Pager: [phone]

## 2016-08-23 NOTE — Progress Notes (Signed)
CARDIAC REHAB PHASE I   PRE:  Rate/Rhythm: 68 SR  BP:  Supine:   Sitting: 109/87  Standing:    SaO2: 99%RA  MODE:  Ambulation: 420 ft   POST:  Rate/Rhythm: 74 SR  BP:  Supine:   Sitting: 117/89  Standing:    SaO2: 100%RA 1055-1127 Pt walked 420 ft with steady gait. Tolerated well. Left CHF booklet and low sodium diets outside of room for RN to give at discharge. Pt stated he will watch sodium and take his meds.   Graylon Good, RN BSN  08/23/2016 11:23 AM

## 2016-08-23 NOTE — Progress Notes (Signed)
Daily Progress Note   Patient Name: John Hicks       Date: 08/23/2016 DOB: Feb 14, 1951  Age: 66 y.o. MRN#: [id number] Attending Physician: Annia Belt, MD Primary Care Physician: No PCP Per Patient Admit Date: 08/12/2016  Reason for Consultation/Follow-up: Disposition, Establishing goals of care and Psychosocial/spiritual support  Subjective: John Hicks is feeling relatively unchanged from yesterday. He feels his diarrhea has slowed a bid, but continues to feel the urge to defecate (despite minimal stool coming out). He is ready for discharge home as he is VERY ready [place] get [place] the beach. Support at discharge discussed at length, see below.  Length of Stay: 11  Current Medications: Scheduled Meds:  . amiodarone  200 mg Oral Daily  . digoxin  0.125 mg Oral Daily  . furosemide  40 mg Oral Daily  . losartan  25 mg Oral Daily  . nicotine  21 mg Transdermal Daily  . nystatin   Topical TID  . potassium chloride  40 mEq Oral BID  . sodium chloride flush  10-40 mL Intracatheter Q12H  . sodium chloride flush  3 mL Intravenous Q12H  . spironolactone  25 mg Oral Daily  . vancomycin  125 mg Oral QID    Continuous Infusions:   PRN Meds: sodium chloride, acetaminophen, ALPRAZolam, ipratropium-albuterol, ondansetron (ZOFRAN) IV, sodium chloride flush, sodium chloride flush  Physical Exam       Constitutional: He is oriented to person, place, and time. Vital signs are normal. He has a sickly appearance.  HENT:  Head: Normocephalic and atraumatic.  Mouth/Throat: Oropharynx is clear and moist. No oropharyngeal exudate.  Eyes: EOM are normal.  Neck: Normal range of motion. Neck supple.  Cardiovascular: Normal rate and regular rhythm.   Pulmonary/Chest: Effort normal. He has decreased breath sounds in the right lower field and the left lower  field. He has no wheezes. He has rhonchi in the right upper field and the left upper field.  Abdominal: He exhibits distension. Bowel sounds are increased.  Musculoskeletal: Normal range of motion. He exhibits edema.  Neurological: He is alert and oriented to person, place, and time.  Skin: Skin is warm and dry. There is pallor.  Psychiatric: He has a normal mood and affect. His behavior is normal. Judgment and thought content normal.     Vital Signs: BP 119/89 (BP Location: Right Arm)   Pulse 69   Temp 97.5 F (36.4 C) (Oral)   Resp 19   Ht 6' (1.829 m)   Wt 94.7 kg (208 lb 11.2 oz)   SpO2 99%   BMI 28.30 kg/m  SpO2: SpO2: 99 % O2 Device: O2 Device: Not Delivered O2 Flow Rate:    Intake/output summary:  Intake/Output Summary (Last 24 hours) at 08/23/16 0937 Last data filed at 08/23/16 0806  Gross per 24 hour  Intake              132 ml  Output             1401 ml  Net            -1269 ml   LBM: Last BM Date: 08/22/16 Baseline Weight: Weight: 117.9 kg (260 lb) Most recent weight:  Weight: 94.7 kg (208 lb 11.2 oz)  Palliative Assessment/Data: PPS 60%     Patient Active Problem List   Diagnosis Date Noted  . Goals of care, counseling/discussion   . Palliative care by specialist   . NSVT (nonsustained ventricular tachycardia) ([place])   . PAF (paroxysmal atrial fibrillation) ([place])   . Cardiogenic shock ([place])   . AKI (acute kidney injury) ([place])   . Typical atrial flutter ([place])   . Acute exacerbation of congestive heart failure ([place]) 08/12/2016  . Tobacco use disorder 08/12/2016  . Essential hypertension 08/12/2016  . Urinary tract infection 08/12/2016    Palliative Care Assessment & Plan   HPI: 66 y.o. male  with past medical history of biventricular heart failure with LVEF 10-15% and severe RV dysfunction, aortic root aneurysm, HTN, and COPD. He presented to the ED with progressive lower extremity and scrotal edema. He was admitted on 08/12/2016 with acute  exacerbation of end stage biventricular heart failure with resultant volume overload, as well as AKI, and C. Diff colitis. Heart failure team following, with plan to wean off milrinone prior to discharge. CM following as pt has limited financial resources, poor social support, and is reluctant to return to [place] for medications or care. Palliative consulted to assist in goals of care.  Assessment: When I met with John Hicks yesterday we had a long conversation about his health state. He had a good grasp on the severity of his health issues, and was able to verbalize an understanding that his time was likely limited. He was still hopeful that naturopathic remedies may enable him more quality time, but understood that there were limited options medically. In exploring his goals for the future he expressed the importance of quality of life over life prolonging measures. Along these lines, we talked about the role Hospice could play in supporting him in quality of life, which for him would be at home or, ideally, at the beach. He was open to Hospice and appreciative that their services existed, both as an option for support at his own home, and as residential.   Today, we talked more about the plan moving forward. Since he has VA insurance/benefits, Hospice would need to be approved and established through them. CM is working with them on setting up Hospice at home, as well as asked them to explore residential options closer to the beach. Additionally, SW asked to assist John Hicks with a financial aid application for a different residential hosp[place] facility [place] (so not going through the [facility]--[facility]). The timeline for either of these plans--VA versus other facility-is unknown. I explained to John Hicks that he could be discharged and the [place] and [place] should contact him for follow-up.  Finally, we did talk about code status today. John Hicks was very clear that he would want to die a natural death and  did not want any interventions taken to prolong his life and cause suffering. He did not want CPR, defibrillation, or intubation. I explained that was called a Do Not Resuscitate, which he had heard of and agreed with.   Recommendations/Plan:  CM working with [place] about establishing Hospice at Home or at a facility closer to the beach   SW consulted to assist in [place] discharge medications discussed with Heart Failure Team, CM notified of need for these to be filled prior to John Hicks leaving the hospital. She is exploring cost and will work with John Hicks on how to obtain these (  CM cannot fill them directly as pt does have insurance. He is just VERY unlikely/unreliable to fill them after discharge as his plans are to spend three [place] at the beach ASAP after discharge)   **As long as John Hicks has his medications filled, I do not believe discharge needs to be held-up waiting on the [place] to coordinate Hospice care. They will contact him directly, which he is aware of. I have provided the contact information for [facility] in his AVS in case he does not hear back from them.   Goals of Care and Additional Recommendations:  Limitations on Scope of Treatment: Medically optimize then discharge with plan for Hospice f/u to be established through the [place]  Code Status:  DNR  Prognosis:   < 6 months  Discharge Planning:  Home with [place] was discussed with pt, care nurse, case manager, HF team, primary team.  Thank you for allowing the Palliative Medicine Team to assist in the care of this patient.  Time in: 0830 Time out: 1010 Total time: 120 minutes    Greater than 50%  of this time was spent counseling and coordinating care related to the above assessment and plan.  Charlynn Court, NP Palliative Medicine Team [phone] pager (7a-5p) Team Phone # [phone]

## 2016-08-23 NOTE — Discharge Instructions (Signed)
[facility]; the [place]. Their contact information is [phone]. That number will take you to their Intake coordinator, who you can talk with about the financial aid application and timeline.    Heart Failure Heart failure means your heart has trouble pumping blood. This makes it hard for your body to work well. Heart failure is usually a long-term (chronic) condition. You must take good care of yourself and follow your doctor's treatment plan. Follow these instructions at home:  Take your heart medicine as told by your doctor.  Do not stop taking medicine unless your doctor tells you to.  Do not skip any dose of medicine.  Refill your medicines before they run out.  Take other medicines only as told by your doctor or pharmacist.  Stay active if told by your doctor. The elderly and people with severe heart failure should talk with a doctor about physical activity.  Eat heart-healthy foods. Choose foods that are without trans fat and are low in saturated fat, cholesterol, and salt (sodium). This includes fresh or frozen fruits and vegetables, fish, lean meats, fat-free or low-fat dairy foods, whole grains, and high-fiber foods. Lentils and dried peas and beans (legumes) are also good choices.  Limit salt if told by your doctor.  Cook in a healthy way. Roast, grill, broil, bake, poach, steam, or stir-fry foods.  Limit fluids as told by your doctor.  Weigh yourself every morning. Do this after you pee (urinate) and before you eat breakfast. Write down your weight to give to your doctor.  Take your blood pressure and write it down if your doctor tells you to.  Ask your doctor how to check your pulse. Check your pulse as told.  Lose weight if told by your doctor.  Stop smoking or chewing tobacco. Do not use gum or patches that help you quit without your doctor's approval.  Schedule and go to doctor visits as told.  Nonpregnant women  should have no more than 1 drink a day. Men should have no more than 2 drinks a day. Talk to your doctor about drinking alcohol.  Stop illegal drug use.  Stay current with shots (immunizations).  Manage your health conditions as told by your doctor.  Learn to manage your stress.  Rest when you are tired.  If it is really hot outside:  Avoid intense activities.  Use air conditioning or fans, or get in a cooler place.  Avoid caffeine and alcohol.  Wear loose-fitting, lightweight, and light-colored clothing.  If it is really cold outside:  Avoid intense activities.  Layer your clothing.  Wear mittens or gloves, a hat, and a scarf when going outside.  Avoid alcohol.  Learn about heart failure and get support as needed.  Get help to maintain or improve your quality of life and your ability to care for yourself as needed. Contact a doctor if:  You gain weight quickly.  You are more short of breath than usual.  You cannot do your normal activities.  You tire easily.  You cough more than normal, especially with activity.  You have any or more puffiness (swelling) in areas such as your hands, feet, ankles, or belly (abdomen).  You cannot sleep because it is hard to breathe.  You feel like your heart is beating fast (palpitations).  You get dizzy or light-headed when you stand up. Get help right away if:  You have trouble breathing.  There is a change in mental status,  such as becoming less alert or not being able to focus.  You have chest pain or discomfort.  You faint. This information is not intended to replace advice given to you by your health care provider. Make sure you discuss any questions you have with your health care provider. Document Released: 02/15/2008 Document Revised: 10/14/2015 Document Reviewed: 06/24/2012 Elsevier Interactive Patient Education  2017 [employer].

## 2016-08-23 NOTE — Progress Notes (Signed)
ANTICOAGULATION CONSULT NOTE - Follow Up Consult  Pharmacy Consult for Heparin Indication: atrial fibrillation  Allergies  Allergen Reactions  . Codeine Other (See Comments)    agitation   Patient Measurements: Height: 6' (182.9 cm) Weight: 202 lb 4.8 oz (91.8 kg) IBW/kg (Calculated) : 77.6 Heparin Dosing Weight:  100.6 kg  Vital Signs: Temp: 97.9 F (36.6 C) (04/03 2332) Temp Source: Oral (04/03 2332) BP: 106/87 (04/03 2332) Pulse Rate: 71 (04/03 2332)  Labs:  Recent Labs  08/21/16 0543 08/21/16 0544 08/22/16 0617 08/23/16 0318  HGB  --  13.2 13.0 13.1  HCT  --  42.5 41.1 42.2  PLT  --  214 216 208  HEPARINUNFRC 0.42  --  0.52 0.77*  CREATININE  --  1.59* 1.45* 1.51*   Estimated Creatinine Clearance: 54.2 mL/min (A) (by C-G formula based on SCr of 1.51 mg/dL (H)).  Assessment: 65yoM continues on heparin for new afib. Heparin level up to supratherapeutic on gtt at 1650 units/hr. No bleeding noted. CBC stable.  Goal of Therapy:  Heparin level 0.3-0.7 units/ml Monitor platelets by anticoagulation protocol: Yes   Plan:  -Decrease heparin to 1550 units/hr -Will f/u 6 hr heparin level  Sherlon Handing, PharmD, BCPS Clinical pharmacist, pager [phone] 08/23/2016 3:45 AM

## 2016-08-23 NOTE — Progress Notes (Signed)
Advanced Heart Failure Rounding Note  [facility]Card[facility]ologist:  [facility]  Subjective:    AHF team consulted 08/14/16 with difficult diuresis and EF 10-15%. PICC placed with concerns for low output HF.  Initial Mixed venous saturation 25% so milrinone was started.   Yesterday milrinone stopped. Todays CO-OX is 71%. Palliative Care met with him. He would like Hospice.   Feeling ok. Wants to go to the beach. Denies SOB/Orthopnea. Still with frequent BMs   Objective:   Weight Range: 208 lb 11.2 oz (94.7 kg) Body mass index is 28.3 kg/m.   Vital Signs:   Temp:  [97.5 F (36.4 C)-98.4 F (36.9 C)] 97.5 F (36.4 C) (04/04 0300) Pulse Rate:  [67-81] 69 (04/04 0805) BP: (106-136)/(76-103) 119/89 (04/04 0805) SpO2:  [96 %-99 %] 99 % (04/04 0300) Weight:  [208 lb 11.2 oz (94.7 kg)] 208 lb 11.2 oz (94.7 kg) (04/04 0500) Last BM Date: 08/22/16  Weight change: Filed Weights   08/21/16 0347 08/22/16 0500 08/23/16 0500  Weight: 203 lb 6.4 oz (92.3 kg) 202 lb 4.8 oz (91.8 kg) 208 lb 11.2 oz (94.7 kg)   Intake/Output:   Intake/Output Summary (Last 24 hours) at 08/23/16 0858 Last data filed at 08/23/16 0806  Gross per 24 hour  Intake              132 ml  Output             1401 ml  Net            -1269 ml     Physical Exam: CVP 6 General:  Well appearing. No resp difficulty. In bed  HEENT: normal Neck: supple. JVP 5-6 . Carotids 2+ bilat; no bruits. No lymphadenopathy or thryomegaly appreciated. Cor: PMI nondisplaced. Regular rate & rhythm. No rubs, or murmurs. + S3  Lungs: clear Abdomen: soft, nontender, nondistended. No hepatosplenomegaly. No bruits or masses. Good bowel sounds. Extremities: no cyanosis, clubbing, rash, R and LLE 1+ edema.  Neuro: alert & orientedx3, cranial nerves grossly intact. moves all 4 extremities w/o difficulty. Affect pleasant  Telemetry:  NSR with PVCs 60-80s. Personally reviewed.      Labs: CBC  Recent Labs  08/22/16 0617 08/23/16 0318  WBC  7.7 8.9  HGB 13.0 13.1  HCT 41.1 42.2  MCV 86.3 86.3  PLT 216 101   Basic Metabolic Panel  Recent Labs  08/20/16 1351  08/22/16 0617 08/22/16 1106 08/23/16 0318  NA  --   < > 137  --  137  K  --   < > 4.5  --  4.8  CL  --   < > 100*  --  102  CO2  --   < > 26  --  27  GLUCOSE  --   < > 82  --  97  BUN  --   < > 23*  --  27*  CREATININE  --   < > 1.45*  --  1.51*  CALCIUM  --   < > 10.3  --  10.3  MG 2.2  --   --  2.5*  --   < > = values in this interval not displayed. Liver Function Tests No results for input(s): AST, ALT, ALKPHOS, BILITOT, PROT, ALBUMIN in the last 72 hours. No results for input(s): LIPASE, AMYLASE in the last 72 hours. Cardiac Enzymes No results for input(s): CKTOTAL, CKMB, CKMBINDEX, TROPONINI in the last 72 hours.  BNP: BNP (last 3 results)  Recent Labs  08/12/16  1637  BNP 2,520.0*    ProBNP (last 3 results) No results for input(s): PROBNP in the last 8760 hours.   D-Dimer No results for input(s): DDIMER in the last 72 hours. Hemoglobin A1C No results for input(s): HGBA1C in the last 72 hours. Fasting Lipid Panel No results for input(s): CHOL, HDL, LDLCALC, TRIG, CHOLHDL, LDLDIRECT in the last 72 hours. Thyroid Function Tests No results for input(s): TSH, T4TOTAL, T3FREE, THYROIDAB in the last 72 hours.  Invalid input(s): FREET3  Other results:     Imaging/Studies:  No results found.    Medications:     Scheduled Medications: . amiodarone  400 mg Oral BID  . digoxin  0.125 mg Oral Daily  . furosemide  40 mg Oral Daily  . losartan  25 mg Oral Daily  . nicotine  21 mg Transdermal Daily  . nystatin   Topical TID  . potassium chloride  40 mEq Oral BID  . sodium chloride flush  10-40 mL Intracatheter Q12H  . sodium chloride flush  3 mL Intravenous Q12H  . spironolactone  25 mg Oral Daily  . vancomycin  125 mg Oral QID    Infusions: . heparin 1,550 Units/hr (08/23/16 0358)    PRN Medications: sodium chloride,  acetaminophen, ALPRAZolam, ipratropium-albuterol, ondansetron (ZOFRAN) IV, sodium chloride flush, sodium chloride flush   Assessment/Plan   1. Cardiogenic shock due to A/C biventricular HF. - LVEF 10-15% with severe RV dysfunction 2. Acute on chronic systolic HF 3. Paroxysmal AFL vs Atrial Tachycardia 4. AKI, likely on CKD - Awaiting outside paperwork 5. Aortic root aneurysm 6. UTI 7. Tobacco abuse 8. NSVT 9. C. Diff colitis   Todays CO-OX 71%. Stable off milrinone. Continue lasix 40 mg daily. Continue losartan 25 mg daily. Would not use spiro or dig with noncompliance.   PAF- in NSR. Stop heparin drip. Continue amio.    Ongoing BMs. C Diff per primary team. On po vanc.    Palliative Care appreciated. He would like Hospice.   Home meds  Lasix 40 mg daily Losartan 25 mg daily Amio 200 mg daily   Length of Stay: [place], NP  08/23/2016, 8:58 AM  Advanced Heart Failure Team Pager [phone] (M-F; [place])  Please contact [facility] for night-coverage after hours (4p -7a ) and weekends on [email or web address]  Patient seen and examined with Darrick Grinder, NP. We discussed all aspects of the encounter. I agree with the assessment and plan as stated above.   Currently stable from HF standpoint off inotropes but remains tenuous. He is adamant that he wants to go today. Ok to d/c on above medications. BP too soft to titrate further.  Continue amio to prevent recurrence of AF. Not candidate for [facility].  I discussed getting him 30-days worth of medication with PharmD and CM but is ineligible due to [facility] benefits. Willtry to arrange low-cost meds through [employer].  Discussed with Hospice today and they will follow post discharge.   Glori Bickers, MD  9:01 PM

## 2016-08-24 NOTE — Care Management Note (Signed)
Case Management Note  Patient Details  Name: John Hicks MRN: [id number] Date of Birth: 10-19-1950  Subjective/Objective:    Pt admitted with HF                  Action/Plan:  PTA from home independent with a roommate.  Pt states he does have [facility] benefits however does not wish to return to clinic - doesn't have established relationship with PCP (per pt he has had multiple bad experiences with [facility])  CM explained to pt the he will not be able to use [facility] pharmacy without having prescriptions written by [facility] doctor - pt acknowledged this and still insist he does not return to to the [place].  CM asked pt for [name] clinic information and at this time pt states he doesn't have any information.  CM left voicemail at [name] for [name] requesting call back.  Pt states he would like to utilize [facility] at discharge -  CM will continue to follow     Expected Discharge Date:  08/23/16               Expected Discharge Plan:  Home/Self Care  In-House Referral:     Discharge planning Services  CM Consult  Post Acute Care Choice:    Choice offered to:     DME Arranged:    DME Agency:     HH Arranged:    HH Agency:     Status of Service:  In process, will continue to follow  If discussed at Long Length of Stay Meetings, dates discussed:    Additional Comments: 08/24/2016  Final Clinical faxed to [name]  08/23/16 CM contacted by [name] - they are requiring pt to do face to face before any referrals will be completed due to hx of lack of compliance - pt has not been seen in clinic since 2015.  CM spoke directly with pt and informed pt that he must attend appt set up for next week to get requested hospice services.  CM also informed pt that medicines will be provided free of charge - if discharge medicines are not provided to pt prior to discharge pt needs to pick them up immediately following discharge - CM provided location of outpt pharmacy on [place].  [facility] verified that they received requested documents.  [name] will provide discharge medications free of charge - IM residents will ensure discharge prescriptions are faxed to pharmacy and will arrange pick up of medication prior to discharge and delivery to pts room.  CM faxed clinical to [facility] as requested [phone]  CM received consult from Palliative to arrange hospice for home/ residential hospice.  CM contacted [name] -  Was then connected with [name] at the [place] clinic - verbal referral for Hospice in the home /residential hospice has been accepted by [name] and has been submitted to the pts primary doctor for review - if the PCP agrees he will then contact the [place] and will follow up directly with pt.  Pt has been deemed stable for discharge home awaiting VA services to be arranged.  VA will continue to work on this referral.  08/22/16 Pt has scheduled appt with [name] clinic 08/30/16 at 10:30am.  CM informed pt and placed appt on AV[facility].  CM informed that pt is now off Milrinone.  [name] requested followup with [place] clinic in [place] as pt is now in agreement to return to clinic.  CM left voice  mail at [name] clinic with RN CM  [name] [phone] requesting post discharge follow up  08/21/16 Pt informed by [facility] that pt is being followed for home inotropic needs - however per attending note pt is likely not a candidate for home IV - plan is to wean drug off during hospital stay.  08/17/16 CM informed by [name] that she is not CM nor SW for pt with the [place].  Pt was admitted to [name] hospital earlier this year and she simply provided documentation.    Pt continues to refuse to seek medical care at [place] contacted [name] clinic to inform of admit.  Pt has PCP [name] at the clinic per [name] transfer coordinator.  CM spoke with RN CM at clinic and was informed that pt has not been seen since 2015 - however pt was recently hospitalized in [place] at the [place] in 05/2016 - CM provided [place] at [place] left voicemail.    Maryclare Labrador, RN 08/24/2016, 9:50 AM

## 2016-08-30 ENCOUNTER — Encounter (HOSPITAL_COMMUNITY): Payer: No Typology Code available for payment source

## 2016-09-01 NOTE — Discharge Summary (Signed)
Name: John Hicks MRN: [id number] DOB: 1950-11-08 66 y.o. PCP: No Pcp Per Patient  Date of Admission: 08/12/2016  3:46 PM Date of Discharge: 08/23/2016 Attending Physician: Dr. Beryle Beams Discharge Diagnosis: 1. Cardiogenic shock due to biventricular Heart Failure 2.  Clostridium difficile 3. Atrial flutter  Principal Problem:   Acute exacerbation of congestive heart failure (HCC) Active Problems:   Tobacco use disorder   Essential hypertension   Urinary tract infection   Typical atrial flutter (HCC)   Cardiogenic shock (HCC)   AKI (acute kidney injury) ([place])   PAF (paroxysmal atrial fibrillation) (HCC)   NSVT (nonsustained ventricular tachycardia) (HCC)   Goals of care, counseling/discussion   Palliative care by specialist   Discharge Medications: Allergies as of 08/23/2016      Reactions   Codeine Other (See Comments)   agitation      Medication List    STOP taking these medications   ALPRAZolam 1 MG tablet Commonly known as:  XANAX   lisinopril 40 MG tablet Commonly known as:  PRINIVIL,ZESTRIL   OVER THE COUNTER MEDICATION   spironolactone 25 MG tablet Commonly known as:  ALDACTONE     TAKE these medications   amiodarone 200 MG tablet Commonly known as:  PACERONE Take 1 tablet (200 mg total) by mouth daily.   aspirin 81 MG chewable tablet Chew 81 mg by mouth daily.   furosemide 40 MG tablet Commonly known as:  LASIX Take 1 tablet (40 mg total) by mouth daily.   losartan 25 MG tablet Commonly known as:  COZAAR Take 1 tablet (25 mg total) by mouth daily.   vancomycin 125 MG capsule Commonly known as:  VANCOCIN Take 1 capsule (125 mg total) by mouth 4 (four) times daily.       Disposition and follow-up:   John Hicks was discharged from [facility] in stable condition.  At the hospital follow up visit please address:  1.  Compliance of heart failure medications 2.  Completion of PO vancomycin   3.  Labs / imaging  needed at time of follow-up: none  4.  Pending labs/ test needing follow-up: none  Follow-up Appointments: Follow-up Information    [facility]. Go on 08/30/2016.   Why:  at 10:30 for hospital discharge follow up appt Contact information: Hours: Open  Closes 4:30PM   [address] [place] [address]  Phone: [phone]       Darrick Grinder, NP Follow up on 08/30/2016.   Specialty:  Cardiology Why:  at 1:30 Garage Code 6000 Contact information: [address] [place] [address] [facility]. Go to.   Why:  Please pick up medications at discharge at  [address] [place], [place] [address] [phone]          Hospital Course by problem list: Principal Problem:   Acute exacerbation of congestive heart failure (HCC) Active Problems:   Tobacco use disorder   Essential hypertension   Urinary tract infection   Typical atrial flutter (HCC)   Cardiogenic shock (HCC)   AKI (acute kidney injury) ([place])   PAF (paroxysmal atrial fibrillation) (HCC)   NSVT (nonsustained ventricular tachycardia) (HCC)   Goals of care, counseling/discussion   Palliative care by specialist   Cardiogenic shock due to A/C biventricular HF. - LVEF 10-15% with severe RV dysfunction Patient had a diagnosis of heart failure in the 90s.  He states that  he has not been taking his spironolactone or lasix. Heart failure team was consulted and followed throughout admission.  Echo on admission showed LVEF 10-15% with severe RV dysfunction.  Patient was started on IV Lasix and milrinone drip and PO spironolactone 25mg .  Patient was started on lisinopril and ultimately switched to losartan.  He was discharged with a weight of 208lbs down from 260lbs on admission.  Patient had significant improvement in his symptoms of leg swelling and shortness of breath prior to discharge.      Clostridium difficile colitis Patient had several watery stools during  admission. C.diff +Ag, +toxin.  Patient was started on PO vancomycin on 3/30 with an end date of 4/13  Atrial flutter/fib/atrial tachcyardia:  Patient was placed on Amiodarone, Digoxin,and heparin.  He reverted to sinus rhythm prior to discharge.  Patient was discharged with amiodarone.  Acute on chronic renal insufficiency Unclear baseline due to lack of records. Cr 1.4 on admission.  Creatinine rose to 1.79 and lasix was held for a day.  Prior to discharge creatinine was 1.51.  Anxiety:On home dose of xanax, 0.5 mg BID PRN.  Discharge Vitals:   BP 130/83 (BP Location: Right Arm)   Pulse 70   Temp 98 F (36.7 C) (Oral)   Resp 19   Ht 6' (1.829 m)   Wt 208 lb 11.2 oz (94.7 kg)   SpO2 99%   BMI 28.30 kg/m   Pertinent Labs, Studies, and Procedures:  Echo  Discharge Instructions: Discharge Instructions    Ambulatory referral to Hospice    Complete by:  As directed    Diet - low sodium heart healthy    Complete by:  As directed    Discharge instructions    Complete by:  As directed    John Hicks Please start taking amiodarone 200 mg daily Lasix 40 mg daily Cozaar 25 mg daily [facility] with [facility] following your discharge from the hospital   Face-to-face encounter (required for Medicare/Medicaid patients)    Complete by:  As directed    I John Hicks certify that this patient is under my care and that I, or a nurse practitioner or physician's assistant working with me, had a face-to-face encounter that meets the physician face-to-face encounter requirements with this patient on 08/23/2016. The encounter with the patient was in whole, or in part for the following medical condition(s) which is the primary reason for home health care (List medical condition): End Stage Biventricular Heart Failure, hospice.   The encounter with the patient was in whole, or in part, for the following medical condition, which is the primary reason for home health care:  Biventricular Heart  Failure, hospice.   I certify that, based on my findings, the following services are medically necessary home health services:  Nursing   Reason for Medically Necessary Home Health Services:  Skilled Nursing- Change/Decline in Patient Status   My clinical findings support the need for the above services:  OTHER SEE COMMENTS   Further, I certify that my clinical findings support that this patient is homebound due to:  Shortness of Breath with activity   Home Health    Complete by:  As directed    Home with Hospice. Hospice Residential.   To provide the following care/treatments:   [place] work     Increase activity slowly    Complete by:  As directed       Signed: Valinda Party, DO 09/01/2016, 1:15 PM  Pager: [phone]

## 2016-09-11 ENCOUNTER — Encounter (HOSPITAL_COMMUNITY): Payer: No Typology Code available for payment source

## 2016-09-12 ENCOUNTER — Telehealth: Payer: Self-pay | Admitting: Licensed Clinical Social Worker

## 2016-09-12 NOTE — Telephone Encounter (Signed)
CSW received referral to follow up with patient due to multiple rescheduled appointments. CSW contacted patient and left message to provide assistance if needed. Raquel Sarna, [place], [place]

## 2016-09-26 ENCOUNTER — Inpatient Hospital Stay (HOSPITAL_COMMUNITY)
Admission: AD | Admit: 2016-09-26 | Discharge: 2016-10-02 | DRG: 291 | Disposition: A | Discharge status: Home / Self Care | Payer: Non-veteran care | Source: Ambulatory Visit | Attending: Internal Medicine | Admitting: Internal Medicine

## 2016-09-26 ENCOUNTER — Encounter (HOSPITAL_COMMUNITY): Payer: Self-pay | Admitting: Adult Health

## 2016-09-26 ENCOUNTER — Encounter (HOSPITAL_COMMUNITY): Payer: Self-pay

## 2016-09-26 ENCOUNTER — Ambulatory Visit (HOSPITAL_COMMUNITY)
Admission: RE | Admit: 2016-09-26 | Discharge: 2016-09-26 | Disposition: A | Discharge status: Home / Self Care | Payer: No Typology Code available for payment source | Source: Ambulatory Visit | Attending: Internal Medicine | Admitting: Internal Medicine

## 2016-09-26 ENCOUNTER — Inpatient Hospital Stay (HOSPITAL_COMMUNITY): Payer: No Typology Code available for payment source

## 2016-09-26 VITALS — BP 136/92 | HR 87 | Wt 229.2 lb

## 2016-09-26 DIAGNOSIS — Z9119 Patient's noncompliance with other medical treatment and regimen: Secondary | ICD-10-CM

## 2016-09-26 DIAGNOSIS — R0600 Dyspnea, unspecified: Secondary | ICD-10-CM

## 2016-09-26 DIAGNOSIS — I5021 Acute systolic (congestive) heart failure: Secondary | ICD-10-CM

## 2016-09-26 DIAGNOSIS — R570 Cardiogenic shock: Secondary | ICD-10-CM | POA: Diagnosis present

## 2016-09-26 DIAGNOSIS — I4892 Unspecified atrial flutter: Secondary | ICD-10-CM | POA: Diagnosis not present

## 2016-09-26 DIAGNOSIS — I5023 Acute on chronic systolic (congestive) heart failure: Secondary | ICD-10-CM | POA: Diagnosis not present

## 2016-09-26 DIAGNOSIS — E875 Hyperkalemia: Secondary | ICD-10-CM | POA: Diagnosis present

## 2016-09-26 DIAGNOSIS — I480 Paroxysmal atrial fibrillation: Secondary | ICD-10-CM

## 2016-09-26 DIAGNOSIS — Z79899 Other long term (current) drug therapy: Secondary | ICD-10-CM

## 2016-09-26 DIAGNOSIS — N183 Chronic kidney disease, stage 3 unspecified: Secondary | ICD-10-CM

## 2016-09-26 DIAGNOSIS — I130 Hypertensive heart and chronic kidney disease with heart failure and stage 1 through stage 4 chronic kidney disease, or unspecified chronic kidney disease: Principal | ICD-10-CM | POA: Diagnosis present

## 2016-09-26 DIAGNOSIS — Z720 Tobacco use: Secondary | ICD-10-CM

## 2016-09-26 DIAGNOSIS — I1310 Hypertensive heart and chronic kidney disease without heart failure, with stage 1 through stage 4 chronic kidney disease, or unspecified chronic kidney disease: Secondary | ICD-10-CM

## 2016-09-26 DIAGNOSIS — E876 Hypokalemia: Secondary | ICD-10-CM | POA: Diagnosis present

## 2016-09-26 DIAGNOSIS — Z515 Encounter for palliative care: Secondary | ICD-10-CM | POA: Diagnosis present

## 2016-09-26 DIAGNOSIS — F17210 Nicotine dependence, cigarettes, uncomplicated: Secondary | ICD-10-CM | POA: Diagnosis present

## 2016-09-26 DIAGNOSIS — R630 Anorexia: Secondary | ICD-10-CM | POA: Diagnosis present

## 2016-09-26 DIAGNOSIS — Z7982 Long term (current) use of aspirin: Secondary | ICD-10-CM

## 2016-09-26 HISTORY — DX: Unspecified atrial fibrillation: I48.91

## 2016-09-26 HISTORY — DX: Dyspnea, unspecified: R06.00

## 2016-09-26 LAB — COMPREHENSIVE METABOLIC PANEL
ALBUMIN: 3.2 g/dL — AB (ref 3.5–5.0)
ALK PHOS: 65 U/L (ref 38–126)
ALT: 28 U/L (ref 17–63)
AST: 60 U/L — AB (ref 15–41)
Anion gap: 10 (ref 5–15)
BILIRUBIN TOTAL: 2 mg/dL — AB (ref 0.3–1.2)
BUN: 25 mg/dL — AB (ref 6–20)
CALCIUM: 9 mg/dL (ref 8.9–10.3)
CO2: 21 mmol/L — ABNORMAL LOW (ref 22–32)
Chloride: 103 mmol/L (ref 101–111)
Creatinine, Ser: 1.41 mg/dL — ABNORMAL HIGH (ref 0.61–1.24)
GFR calc Af Amer: 59 mL/min — ABNORMAL LOW (ref >60)
GFR calc non Af Amer: 51 mL/min — ABNORMAL LOW (ref >60)
GLUCOSE: 103 mg/dL — AB (ref 65–99)
Potassium: 5.9 mmol/L — ABNORMAL HIGH (ref 3.5–5.1)
Sodium: 134 mmol/L — ABNORMAL LOW (ref 135–145)
TOTAL PROTEIN: 6.5 g/dL (ref 6.5–8.1)

## 2016-09-26 LAB — CBC
HEMATOCRIT: 38.8 % — AB (ref 39.0–52.0)
HEMOGLOBIN: 12 g/dL — AB (ref 13.0–17.0)
MCH: 26.5 pg (ref 26.0–34.0)
MCHC: 30.9 g/dL (ref 30.0–36.0)
MCV: 85.7 fL (ref 78.0–100.0)
Platelets: 244 10*3/uL (ref 150–400)
RBC: 4.53 MIL/uL (ref 4.22–5.81)
RDW: 19.1 % — ABNORMAL HIGH (ref 11.5–15.5)
WBC: 9.3 10*3/uL (ref 4.0–10.5)

## 2016-09-26 LAB — BRAIN NATRIURETIC PEPTIDE: B Natriuretic Peptide: 3415.8 pg/mL — ABNORMAL HIGH (ref 0.0–100.0)

## 2016-09-26 LAB — TROPONIN I: Troponin I: 0.07 ng/mL (ref <0.03)

## 2016-09-26 LAB — MRSA PCR SCREENING: MRSA BY PCR: NEGATIVE

## 2016-09-26 LAB — TSH: TSH: 5.036 u[IU]/mL — ABNORMAL HIGH (ref 0.350–4.500)

## 2016-09-26 MED ORDER — ACETAMINOPHEN 325 MG PO TABS: 650.0000 mg | ORAL_TABLET | ORAL | Status: DC | PRN

## 2016-09-26 MED ORDER — SODIUM CHLORIDE 0.9% FLUSH
10.0000 mL | Freq: Two times a day (BID) | INTRAVENOUS | Status: DC
  Administered 2016-09-26 – 2016-09-27 (×2): 10 mL
  Administered 2016-10-01: 20 mL
  Administered 2016-10-01: 10 mL
  Administered 2016-10-01: 30 mL

## 2016-09-26 MED ORDER — ONDANSETRON HCL 4 MG/2ML IJ SOLN: 4.0000 mg | Freq: Four times a day (QID) | INTRAMUSCULAR | Status: DC | PRN

## 2016-09-26 MED ORDER — SODIUM CHLORIDE 0.9 % IV SOLN: 250.0000 mL | INTRAVENOUS | Status: DC | PRN

## 2016-09-26 MED ORDER — SODIUM CHLORIDE 0.9% FLUSH: 10.0000 mL | INTRAVENOUS | Status: DC | PRN

## 2016-09-26 MED ORDER — FUROSEMIDE 10 MG/ML IJ SOLN
80.0000 mg | Freq: Two times a day (BID) | INTRAMUSCULAR | Status: DC
  Administered 2016-09-26 – 2016-09-27 (×2): 80 mg via INTRAVENOUS
  Filled 2016-09-26 (×2): qty 8

## 2016-09-26 MED ORDER — ASPIRIN 81 MG PO CHEW
81.0000 mg | CHEWABLE_TABLET | Freq: Every day | ORAL | Status: DC
  Administered 2016-09-27 – 2016-10-02 (×6): 81 mg via ORAL
  Filled 2016-09-26 (×6): qty 1

## 2016-09-26 MED ORDER — ENOXAPARIN SODIUM 40 MG/0.4ML ~~LOC~~ SOLN
40.0000 mg | SUBCUTANEOUS | Status: DC
  Administered 2016-09-26 – 2016-09-27 (×2): 40 mg via SUBCUTANEOUS
  Filled 2016-09-26 (×2): qty 0.4

## 2016-09-26 MED ORDER — ALPRAZOLAM 0.25 MG PO TABS
0.2500 mg | ORAL_TABLET | Freq: Every evening | ORAL | Status: DC | PRN
  Administered 2016-09-26: 0.25 mg via ORAL
  Filled 2016-09-26: qty 1

## 2016-09-26 NOTE — Addendum Note (Signed)
Encounter addended by: Effie Berkshire, RN on: 09/26/2016  4:54 PM<BR>    Actions taken: Order list changed, Diagnosis association updated, Sign clinical note

## 2016-09-26 NOTE — H&P (Signed)
Advanced Heart Failure Team History and Physical Note   Primary Physician: Wheeler  Primary HF Cardiologist:  Dr Haroldine Laws  Reason for Admission: A/C Biventricular Heart Failure    HPI:   Mr Denardo is a 66 year old with a history of HTN, biventricular heart failure, PAF, and aortic root aneurysm admitted from HF clinic with A/C biventricular heart failure.   Admitted March 26-August 23, 2016 with marked volume overload and A/C biventricular heart failure. Hospital course complicated by cardiogenic shock, PAF, and C diff. He required short term milrinone. Diuresed down to 208 pounds. Prior to d/c he met with Palliative Care and requested to transition to Hospice. He went to post hospital visit with [facility] doctor and at that time he was doing ok so carvedilol and spironolactone added. He never transitioned to Hospice.    He has cancelled the 2 HF follow up appointments.   Today he presented to the HF clinic with increased dyspnea at rest and exeriton. Complaining of orthopnea. Increased leg edema. Poor appetite. Intermittent nausea. Says he has been taking all medications but noticed increased swelling. Continues to smoke 1/2 ppd. He was unable to drive to the his appointment today due to fatigue and dyspnea.   ECHO 08/13/2016- LVEF 10-15% RV moderately dilated.   Review of Systems: [y] = yes, '[ ]'  = no   General: Weight gain [ Y]; Weight loss '[ ]' ; Anorexia '[ ]' ; Fatigue [Y ]; Fever '[ ]' ; Chills '[ ]' ; Weakness '[ ]'   Cardiac: Chest pain/pressure '[ ]' ; Resting SOB [ Y]; Exertional SOB [Y ]; Orthopnea [Y ]; Pedal Edema '[ ]' ; Palpitations '[ ]' ; Syncope '[ ]' ; Presyncope '[ ]' ; Paroxysmal nocturnal dyspnea'[ ]'   Pulmonary: Cough Jazmín.Cullens ]; Wheezing'[ ]' ; Hemoptysis'[ ]' ; Sputum '[ ]' ; Snoring '[ ]'   GI: Vomiting'[ ]' ; Dysphagia'[ ]' ; Melena'[ ]' ; Hematochezia '[ ]' ; Heartburn'[ ]' ; Abdominal pain '[ ]' ; Constipation '[ ]' ; Diarrhea '[ ]' ; BRBPR '[ ]'   GU: Hematuria'[ ]' ; Dysuria '[ ]' ; Nocturia'[ ]'   Vascular: Pain in legs with walking  '[ ]' ; Pain in feet with lying flat '[ ]' ; Non-healing sores '[ ]' ; Stroke '[ ]' ; TIA '[ ]' ; Slurred speech '[ ]' ;  Neuro: Headaches'[ ]' ; Vertigo'[ ]' ; Seizures'[ ]' ; Paresthesias'[ ]' ;Blurred vision '[ ]' ; Diplopia '[ ]' ; Vision changes '[ ]'   Ortho/Skin: Arthritis '[ ]' ; Joint pain [ Y]; Muscle pain '[ ]' ; Joint swelling '[ ]' ; Back Pain '[ ]' ; Rash '[ ]'   Psych: Depression'[ ]' ; Anxiety[Y ]  Heme: Bleeding problems '[ ]' ; Clotting disorders '[ ]' ; Anemia '[ ]'   Endocrine: Diabetes '[ ]' ; Thyroid dysfunction'[ ]'   Home Medications Prior to Admission medications   Medication Sig Start Date End Date Taking? Authorizing Provider  ALPRAZolam Duanne Moron) 0.25 MG tablet Take 0.25 mg by mouth at bedtime as needed for anxiety.    [provider]  aspirin 81 MG chewable tablet Chew 81 mg by mouth daily.    [provider]  furosemide (LASIX) 40 MG tablet Take 1 tablet (40 mg total) by mouth daily. 08/24/16   Zada Finders, MD  losartan (COZAAR) 25 MG tablet Take 1 tablet (25 mg total) by mouth daily. 08/24/16   Burns, Arloa Koh, MD  spironolactone (ALDACTONE) 25 MG tablet Take 25 mg by mouth daily.    [provider]    Past Medical History: Past Medical History:  Diagnosis Date  . CHF (congestive heart failure) ([place])   . Enlarged heart   . Hypertension  Past Surgical History: Past Surgical History:  Procedure Laterality Date  . APPENDECTOMY    . KNEE SURGERY      Family History: Family History  Problem Relation Age of Onset  . Stroke Father   . Multiple sclerosis Sister     Social History: Social History   Social History  . Marital status: Single    Spouse name: N/A  . Number of children: N/A  . Years of education: N/A   Social History Main Topics  . Smoking status: Current Every Day Smoker    Packs/day: 0.50    Types: Cigarettes  . Smokeless tobacco: Never Used  . Alcohol use No  . Drug use: Yes    Types: Marijuana  . Sexual activity: No   Other Topics Concern  . None   Social History  Narrative  . None    Allergies:  Allergies  Allergen Reactions  . Codeine Other (See Comments)    agitation    Objective:    Vital Signs:   Last BM Date: 09/26/16 [facility] [place] [facility] Weights   09/26/16 1727  Weight: 101.8 kg (224 lb 6 oz)   Vitals:   09/26/16 1727  BP: (!) 129/108  Pulse: 90  Resp: (!) 24  Temp: 98 F (36.7 C)    Physical Exam: General:  Dyspneic at rest. Ill appearing  HEENT: normal Neck: supple. JVP to ear. Carotids 2+ bilat; no bruits. No lymphadenopathy or thryomegaly appreciated. Cor: PMI Laterally displaced. Regular rate & rhythm. No rubs, or murmurs. + S3  Lungs: clear Abdomen: soft, nontender, + distended. No hepatosplenomegaly. No bruits or masses. Good bowel sounds. Extremities: no cyanosis, clubbing, rash, R and LLE 3+  Edema extends to thighs.  Neuro: alert & orientedx3, cranial nerves grossly intact. moves all 4 extremities w/o difficulty. Affect pleasant  Telemetry: NSR 80s   Labs: Basic Metabolic Panel:  Recent Labs Lab 09/26/16 1636  NA 134*  K 5.9*  CL 103  CO2 21*  GLUCOSE 103*  BUN 25*  CREATININE 1.41*  CALCIUM 9.0    Liver Function Tests:  Recent Labs Lab 09/26/16 1636  AST 60*  ALT 28  ALKPHOS 65  BILITOT 2.0*  PROT 6.5  ALBUMIN 3.2*   No results for input(s): LIPASE, AMYLASE in the last 168 hours. No results for input(s): AMMONIA in the last 168 hours.  CBC:  Recent Labs Lab 09/26/16 1636  WBC 9.3  HGB 12.0*  HCT 38.8*  MCV 85.7  PLT 244    Cardiac Enzymes:  Recent Labs Lab 09/26/16 1636  TROPONINI 0.07*    BNP: BNP (last 3 results)  Recent Labs  08/12/16 1637 09/26/16 1636  BNP 2,520.0* 3,415.8*    ProBNP (last 3 results) No results for input(s): PROBNP in the last 8760 hours.   CBG: No results for input(s): GLUCAP in the last 168 hours.  Coagulation Studies: No results for input(s): LABPROT, INR in the last 72 hours.  Other results: EKG: Sinus Rhythm 1st degree heart block. PR  210 ms QRS 216  Imaging: No results found.      Assessment/Plan/Discussion  Mr Yandow is a 66 year old admitted today with A/C biventricular heart failure.   1. A/C Biventricular Systolic Heart Failure-  ECHO 3/25/2018LVEF 10-15% + moderately dilated RV.  NYHA IV. Marked volume overload. Try to diurese with IV lasix however the last admit he had cardiogenic shock with severely reduced mixed saturation and required short term milrinone.  Will place PICC for CVP and CO-OX.  If CO-OX low will add short term milrinone.  Stop bb (Started by MD at [facility]).  Hold off on ARB and see how he responds to lasix.  2. PAF - maintaining NSR.   3. CKD Stage III- check BMET  4. Tobacco Abuse 5. Hyperkalemia   He is requesting full code at this time. He is not a candidate for advanced therapies with biventricular failure and limited social support. We will need to involve palliative care team again.    Length of Stay: 0   Bensimhon, Daniel NP-C  09/26/2016, 7:47 PM  Advanced Heart Failure Team Pager [phone] (M-F; [place])  Please contact [facility] for night-coverage after hours (4p -7a ) and weekends on [email or web address]  Patient seen and examined with Darrick Grinder, NP. We discussed all aspects of the encounter. I agree with the assessment and plan as stated above.   66 y/o male with severe biventricular HF well known to me from recent admit requiring massive diuresis with inotopic support. Now admitted with recurrent massive volume overload and probable low output HF. Will start with IV diuresis. Agree with central access to follow CVP and co-ox. Suspect he will require inotropic support. Not candidate for home inotropes given lack of stable home environment or social support. Will watch K and renal function closely. Hopefully K will correct with diuresis. Can give Kayexalate as needed.   Glori Bickers, MD  7:47 PM

## 2016-09-26 NOTE — Addendum Note (Signed)
Encounter addended by: Kerry Dory, CMA on: 09/26/2016  4:36 PM<BR>    Actions taken: Diagnosis association updated, Order list changed

## 2016-09-26 NOTE — Progress Notes (Signed)
PCP: None has been followed by [facility] in the past.  Primary HF Cardiologist: Dr Haroldine Laws   HPI: John Hicks is a 66 year old with a history of HTN, chronic biventricular heart failure, PAF, and aortic root aneurysm.   Admitted March 26th with cardiogenic shock. Initial mixed venous saturation 25% so he was placed on short term milrinone. Also had A fib but chemically converted on amio. Hospital course was complicated by c diff. Completed vancomycin course. While hospitalized he met with Palliative Care and elected to transition to Hospice. He was discharged on April 4th and was instructed to follow up at [facility] on April 11 to set up Hospice.  Discharge weight 208 pounds.   Today he returns for post hospital follow up. He has cancelled the last 2 appointments. Overall feeling terrible. SOB at rest and with exertion. He was unable to drive today because he was short of breath. He has been evaluated at the [place] at [facility] and started on carvedilol and spiro. Also losartan was increased to 50 mg per day. Complaining of nausea. Appetite poor. Having difficulty walking. He is not weighing at home because he does not have scale. Smoking 1/2 PPD. Rents a room.    ECHO LVEF 10-15% RV moderately dilated.  ROS: All systems negative except as listed in HPI, PMH and Problem List.  SH:  Social History   Social History  . Marital status: Single    Spouse name: N/A  . Number of children: N/A  . Years of education: N/A   Occupational History  . Not on file.   Social History Main Topics  . Smoking status: Current Every Day Smoker    Packs/day: 0.10    Types: Cigarettes  . Smokeless tobacco: Never Used  . Alcohol use No  . Drug use: Yes    Types: Marijuana  . Sexual activity: Not on file   Other Topics Concern  . Not on file   Social History Narrative  . No narrative on file    FH: No family history on file.  Past Medical History:  Diagnosis Date  . CHF (congestive heart failure)  ([place])   . Enlarged heart   . Hypertension     Current Outpatient Prescriptions  Medication Sig Dispense Refill  . ALPRAZolam (XANAX) 0.25 MG tablet Take 0.25 mg by mouth at bedtime as needed for anxiety.    Marland Kitchen aspirin 81 MG chewable tablet Chew 81 mg by mouth daily.    . furosemide (LASIX) 40 MG tablet Take 1 tablet (40 mg total) by mouth daily. 30 tablet 0  . losartan (COZAAR) 25 MG tablet Take 1 tablet (25 mg total) by mouth daily. 30 tablet 0  . spironolactone (ALDACTONE) 25 MG tablet Take 25 mg by mouth daily.     No current facility-administered medications for this encounter.     Vitals:   09/26/16 1554  BP: (!) 136/92  Pulse: 87  SpO2: 99%  Weight: 229 lb 4 oz (104 kg)   Filed Weights   09/26/16 1554  Weight: 229 lb 4 oz (104 kg)   PHYSICAL EXAM: General:  Dyspneic at rest. Sitting in a wheel chair.  HEENT: normal Neck: supple. JVP to ear. Carotids 2+ bilaterally; no bruits. No lymphadenopathy or thryomegaly appreciated. Cor: PMI normal. Regular rate & rhythm. No rubs, or murmurs. + S3  Lungs: Crackles  Abdomen: soft, nontender, nondistended. No hepatosplenomegaly. No bruits or masses. Good bowel sounds. Extremities: no cyanosis, clubbing, rash, R  and LLE 3+  Edema.  Neuro: alert & orientedx3, cranial nerves grossly intact. Moves all 4 extremities w/o difficulty. Affect pleasant. Skin: warm.    ASSESSMENT & PLAN: 1. Acute/Chronic Biventricular Heart Failure 07/2016 ECHO LVEP 10-15%. RV moderately dilated. NYHA IV.  Marked volume overload. Start 80 mg IV twice a day. Significant weight gain since discharge.  Stop bb. Hold Arb  May need short term milrinone if mixed venous saturation low.  2. PAF- Check EKG.  3. CKD Stage III- Follow BMET closely  3. Tobacco Abuse- counseled on smoking cessation.   I have asked him what his wishes are if he has respiratory or cardiac arrest. He is requesting full code at this time. Admit to stepdown. Place PICC for CVP and mixed  venous saturation. Will also ask Palliative Care to consult.   Check CBC, CMET, BNP.   Amy Clegg NP-C  4:18 PM

## 2016-09-26 NOTE — Progress Notes (Signed)
Peripherally Inserted Central Catheter/Midline Placement  The IV Nurse has discussed with the patient and/or persons authorized to consent for the patient, the purpose of this procedure and the potential benefits and risks involved with this procedure.  The benefits include less needle sticks, lab draws from the catheter, and the patient may be discharged home with the catheter. Risks include, but not limited to, infection, bleeding, blood clot (thrombus formation), and puncture of an artery; nerve damage and irregular heartbeat and possibility to perform a PICC exchange if needed/ordered by physician.  Alternatives to this procedure were also discussed.  Bard Power PICC patient education guide, fact sheet on infection prevention and patient information card has been provided to patient /or left at bedside.    PICC/Midline Placement Documentation  PICC Double Lumen 09/26/16 PICC Right Brachial 41 cm (Active)       John Hicks 09/26/2016, 6:41 PM

## 2016-09-26 NOTE — Progress Notes (Signed)
PIV x 2 attempts inserted to right posterior wrist 22g. Patient tolerated well. Saline locked and secured with tegaderm dressing.  Renee Pain, RN

## 2016-09-27 ENCOUNTER — Encounter (HOSPITAL_COMMUNITY): Payer: Self-pay | Admitting: General Practice

## 2016-09-27 DIAGNOSIS — I5023 Acute on chronic systolic (congestive) heart failure: Secondary | ICD-10-CM | POA: Diagnosis not present

## 2016-09-27 LAB — BASIC METABOLIC PANEL
ANION GAP: 11 (ref 5–15)
ANION GAP: 9 (ref 5–15)
BUN: 28 mg/dL — ABNORMAL HIGH (ref 6–20)
BUN: 24 mg/dL — ABNORMAL HIGH (ref 6–20)
CALCIUM: 8.4 mg/dL — AB (ref 8.9–10.3)
CHLORIDE: 101 mmol/L (ref 101–111)
CO2: 26 mmol/L (ref 22–32)
CO2: 25 mmol/L (ref 22–32)
Calcium: 9.2 mg/dL (ref 8.9–10.3)
Chloride: 105 mmol/L (ref 101–111)
Creatinine, Ser: 1.56 mg/dL — ABNORMAL HIGH (ref 0.61–1.24)
Creatinine, Ser: 1.33 mg/dL — ABNORMAL HIGH (ref 0.61–1.24)
GFR calc non Af Amer: 45 mL/min — ABNORMAL LOW (ref >60)
GFR, EST AFRICAN AMERICAN: 52 mL/min — AB (ref >60)
GFR, EST NON AFRICAN AMERICAN: 55 mL/min — AB (ref >60)
GLUCOSE: 98 mg/dL (ref 65–99)
Glucose, Bld: 106 mg/dL — ABNORMAL HIGH (ref 65–99)
POTASSIUM: 3.8 mmol/L (ref 3.5–5.1)
Potassium: 2.8 mmol/L — ABNORMAL LOW (ref 3.5–5.1)
Sodium: 138 mmol/L (ref 135–145)
Sodium: 139 mmol/L (ref 135–145)

## 2016-09-27 LAB — COOXEMETRY PANEL
CARBOXYHEMOGLOBIN: 1.5 % (ref 0.5–1.5)
CARBOXYHEMOGLOBIN: 2.1 % — AB (ref 0.5–1.5)
METHEMOGLOBIN: 0.8 % (ref 0.0–1.5)
Methemoglobin: 0.7 % (ref 0.0–1.5)
O2 Saturation: 29.2 %
O2 Saturation: 60.8 %
Total hemoglobin: 12.3 g/dL (ref 12.0–16.0)
Total hemoglobin: 11.6 g/dL — ABNORMAL LOW (ref 12.0–16.0)

## 2016-09-27 MED ORDER — METOLAZONE 5 MG PO TABS
2.5000 mg | ORAL_TABLET | Freq: Once | ORAL | Status: AC
  Administered 2016-09-27: 2.5 mg via ORAL
  Filled 2016-09-27: qty 1

## 2016-09-27 MED ORDER — ALPRAZOLAM 0.25 MG PO TABS
0.2500 mg | ORAL_TABLET | Freq: Three times a day (TID) | ORAL | Status: DC | PRN
  Administered 2016-09-27 – 2016-09-29 (×5): 0.25 mg via ORAL
  Filled 2016-09-27 (×5): qty 1

## 2016-09-27 MED ORDER — ALPRAZOLAM 0.25 MG PO TABS
0.2500 mg | ORAL_TABLET | Freq: Once | ORAL | Status: AC
  Administered 2016-09-27: 0.25 mg via ORAL
  Filled 2016-09-27: qty 1

## 2016-09-27 MED ORDER — POTASSIUM CHLORIDE CRYS ER 20 MEQ PO TBCR
40.0000 meq | EXTENDED_RELEASE_TABLET | Freq: Three times a day (TID) | ORAL | Status: DC
  Administered 2016-09-27 – 2016-09-28 (×5): 40 meq via ORAL
  Filled 2016-09-27 (×5): qty 2

## 2016-09-27 MED ORDER — FUROSEMIDE 10 MG/ML IJ SOLN
80.0000 mg | Freq: Three times a day (TID) | INTRAMUSCULAR | Status: DC
  Administered 2016-09-27 – 2016-09-28 (×3): 80 mg via INTRAVENOUS
  Filled 2016-09-27 (×3): qty 8

## 2016-09-27 MED ORDER — MILRINONE LACTATE IN DEXTROSE 20-5 MG/100ML-% IV SOLN
0.2500 ug/kg/min | INTRAVENOUS | Status: DC
  Administered 2016-09-27 – 2016-09-28 (×4): 0.25 ug/kg/min via INTRAVENOUS
  Filled 2016-09-27 (×4): qty 100

## 2016-09-27 NOTE — Care Management Note (Addendum)
Case Management Note  Patient Details  Name: John Hicks MRN: [id number] Date of Birth: 03/11/1951  Subjective/Objective:   Pt presented for Acute on Chronic Systolic Heart Failure. Pt  Initiated on IV Milrinone gtt. Pt had previous PCP at [facility]- now uses [facility]. CM did call [facility] to make them aware that pt is hospitalized.             Action/Plan: CM did call the [facility] in regards to [facility]. Medications via the [place]- sends to home. If new Rx please fax to [phone]. CM will continue to monitor for additional needs.   Expected Discharge Date:                  Expected Discharge Plan:  [place]  In-House Referral:     Discharge planning Services  CM Consult  Post Acute Care Choice:   N/A  Choice offered to:   N/A  DME Arranged:   N/A DME Agency:   N/A  HH Arranged:   N/A HH Agency:     Status of Service:  COMPLETED If discussed at [place] of Stay Meetings, dates discussed:    Additional Comments: 10-02-16 [address] [place] Kra[place], [place] [phone] CM reached out to [place]. Pt will be able to get medications via the [place] @ the [facility]. Pt will be able to pick up a scale at his follow up appointment in the Heart Failure Clinic. No further needs from CM @ this time.    10-02-16 [address] [place], [place] [phone] CM did call [facility] and medications will total $61.85. Per pt he will be able to afford, however it will be tight. CM did discuss with pt if he needed a HHRN and he states he will not be able to afford- pt states he will not need RN at this time- however he states he will be compliant with medications. [place] [facility] information given to patient and a discount card provided to patient.  No further needs from CM at this time.   1041 10-02-16 Jacqlyn Kra[place], RN, BSN [phone] Pt is from home with roommate.  Plan to return home today. Pt will use his local [place] for new medications.  Bethena Roys, RN 09/27/2016, 2:36 PM

## 2016-09-27 NOTE — Progress Notes (Signed)
Advanced Heart Failure Rounding Note  PCP: [place]  Primary Cardiologist: Dr Haroldine Laws  Subjective:    Admitted from clinic 5/8 with volume overload.    Co ox 29% this am. Lethargic, extremities cool. CVP 15.  BP stable. Weight down 2 pounds.   Creatinine 1.41->1.56.   Objective:   Weight Range: 222 lb 6.4 oz (100.9 kg) Body mass index is 30.16 kg/m.   Vital Signs:   Temp:  [97.7 F (36.5 C)-98 F (36.7 C)] 97.8 F (36.6 C) (05/09 0359) Pulse Rate:  [84-95] 85 (05/09 0359) Resp:  [14-24] 14 (05/08 2007) BP: (129-141)/(86-108) 129/96 (05/09 0359) SpO2:  [94 %-100 %] 96 % (05/09 0359) Weight:  [222 lb 6.4 oz (100.9 kg)-224 lb 6 oz (101.8 kg)] 222 lb 6.4 oz (100.9 kg) (05/09 0359) Last BM Date: 09/26/16  Weight change: Filed Weights   09/26/16 1727 09/27/16 0359  Weight: 224 lb 6 oz (101.8 kg) 222 lb 6.4 oz (100.9 kg)    Intake/Output:   Intake/Output Summary (Last 24 hours) at 09/27/16 0710 Last data filed at 09/27/16 0405  Gross per 24 hour  Intake                0 ml  Output             1250 ml  Net            -1250 ml     Physical Exam: General:  Ill appearing male, NAD. Lying in bed.  HEENT: normal Neck: supple. JVP to jaw . Carotids 2+ bilat; no bruits. No lymphadenopathy or thyromegaly appreciated. Cor: PMI laterally displaced. Regular rate and rhythm. No rubs, gallops or murmurs. Lungs: Diminished throughout.  Abdomen: soft, nontender, mildly distended. No hepatosplenomegaly. No bruits or masses. Good bowel sounds. Extremities: no cyanosis, clubbing, rash. Cool. 2+ edema to knees. Legs mottled.  Neuro: alert & orientedx3, lethargic. cranial nerves grossly intact. moves all 4 extremities w/o difficulty. Affect pleasant   Telemetry: NSR, frequent PVC's Rates in the 90's.  Labs: CBC  Recent Labs  09/26/16 1636  WBC 9.3  HGB 12.0*  HCT 38.8*  MCV 85.7  PLT 767   Basic Metabolic Panel  Recent Labs  09/26/16 1636  09/27/16 0418  NA 134* 138  K 5.9* 3.8  CL 103 101  CO2 21* 26  GLUCOSE 103* 106*  BUN 25* 28*  CREATININE 1.41* 1.56*  CALCIUM 9.0 9.2   Liver Function Tests  Recent Labs  09/26/16 1636  AST 60*  ALT 28  ALKPHOS 65  BILITOT 2.0*  PROT 6.5  ALBUMIN 3.2*   Cardiac Enzymes  Recent Labs  09/26/16 1636  TROPONINI 0.07*    BNP: BNP (last 3 results)  Recent Labs  08/12/16 1637 09/26/16 1636  BNP 2,520.0* 3,415.8*    Thyroid Function Tests  Recent Labs  09/26/16 1636  TSH 5.036*    Imaging/Studies:  Dg Chest Port 1 View  Result Date: 09/26/2016 CLINICAL DATA:  PICC line.  Dyspnea. EXAM: PORTABLE CHEST 1 VIEW COMPARISON:  Chest radiograph 08/12/2016 FINDINGS: There is a right-sided PICC line with tip at the cavoatrial junction. The cardiomediastinal silhouette remains massively enlarged, unchanged when allowing for magnification due to AP technique. No pulmonary edema or focal airspace disease. No pneumothorax. There is a small right pleural effusion. IMPRESSION: 1. PICC line tip at the cavoatrial junction. 2. Unchanged massive enlargement of the cardiomediastinal silhouette, which may indicate pericardial effusion versus cardiomegaly alone. 3. Small right  pleural effusion. Electronically Signed   By: Ulyses Jarred M.D.   On: 09/26/2016 21:26       Medications:     Scheduled Medications: . aspirin  81 mg Oral Daily  . enoxaparin (LOVENOX) injection  40 mg Subcutaneous Q24H  . furosemide  80 mg Intravenous BID  . sodium chloride flush  10-40 mL Intracatheter Q12H     Infusions: . sodium chloride       PRN Medications:  sodium chloride, acetaminophen, ALPRAZolam, ondansetron (ZOFRAN) IV, sodium chloride flush   Assessment/Plan   1.Acute on chronic biventriacular systolic heart failure: Etiology unclear. ECHO 08/13/2016 -LVEF 10-15% + moderately dilated RV.  - NYHA IV.  - Remains volume overloaded on exam.  - Co ox 29, start milrinone 0.25 mcg  to help facilitate diuresis. Get co ox in one hour.  - BP stable now, SBP in the 140's.  - Increase lasix to 80mg  TID, add metolazone 2.5mg   - No beta blocker with decompensation.  - No ACE/ARB due to CKD - Not a candidate for advanced therapies with RV dysfunction, lack of support and on going tobacco abuse.  2. PAF:  - maintaining NSR now, but did require Amio gtt last admit when started on milrinone.  - Was not on anticoagulation outpatient due to non compliance.  3. CKD Stage III:  - Creatinine 1.56 - baseline 1.3-1.5 4. Tobacco Abuse - Encouraged cessation. 5. Hyperkalemia  - resolved.   Length of Stay: [place], NP  09/27/2016, 7:10 AM Advanced Heart Failure Team  Pager [phone] M-F 7am-4pm.  Please contact [facility] for night-coverage after hours (4p -7a ) and weekends on [email or web address]  Patient seen and examined with Jettie Booze, NP. We discussed all aspects of the encounter. I agree with the assessment and plan as stated above.   Co-ox and symptoms this am consistent with low output HF/cardiogenic shock physiology. He has been started on IV milrinone which appears to be helping. More alert. Urine output picking up. Renal function stabilizing. Will continue milrinone and IV diuresis for now. Hyperkalemia has resolved.   Once full diuresed will need to try and wean milrinone. We had a long discussion about the possiibility of advanced therapies but I think his social situation and noncompliance prohibits this as he lives with a roommate (his landlord) who does not participate in his medical care.  Glori Bickers, MD  10:00 PM

## 2016-09-28 DIAGNOSIS — I5023 Acute on chronic systolic (congestive) heart failure: Secondary | ICD-10-CM | POA: Diagnosis not present

## 2016-09-28 DIAGNOSIS — I4892 Unspecified atrial flutter: Secondary | ICD-10-CM | POA: Diagnosis not present

## 2016-09-28 LAB — BASIC METABOLIC PANEL
ANION GAP: 10 (ref 5–15)
BUN: 29 mg/dL — ABNORMAL HIGH (ref 6–20)
CO2: 31 mmol/L (ref 22–32)
CREATININE: 1.63 mg/dL — AB (ref 0.61–1.24)
Calcium: 9.6 mg/dL (ref 8.9–10.3)
Chloride: 98 mmol/L — ABNORMAL LOW (ref 101–111)
GFR, EST AFRICAN AMERICAN: 50 mL/min — AB (ref >60)
GFR, EST NON AFRICAN AMERICAN: 43 mL/min — AB (ref >60)
GLUCOSE: 103 mg/dL — AB (ref 65–99)
Potassium: 3.5 mmol/L (ref 3.5–5.1)
Sodium: 139 mmol/L (ref 135–145)

## 2016-09-28 LAB — COOXEMETRY PANEL
CARBOXYHEMOGLOBIN: 2.2 % — AB (ref 0.5–1.5)
Methemoglobin: 0.6 % (ref 0.0–1.5)
O2 Saturation: 65.8 %
Total hemoglobin: 12.5 g/dL (ref 12.0–16.0)

## 2016-09-28 LAB — MAGNESIUM: Magnesium: 2 mg/dL (ref 1.7–2.4)

## 2016-09-28 MED ORDER — HEPARIN (PORCINE) IN NACL 100-0.45 UNIT/ML-% IJ SOLN
1500.0000 [IU]/h | INTRAMUSCULAR | Status: DC
  Administered 2016-09-28: 1300 [IU]/h via INTRAVENOUS
  Administered 2016-09-29: 1500 [IU]/h via INTRAVENOUS
  Filled 2016-09-28 (×2): qty 250

## 2016-09-28 MED ORDER — FUROSEMIDE 10 MG/ML IJ SOLN: 80.0000 mg | Freq: Two times a day (BID) | INTRAMUSCULAR | Status: DC

## 2016-09-28 MED ORDER — HEPARIN BOLUS VIA INFUSION
4000.0000 [IU] | Freq: Once | INTRAVENOUS | Status: AC
  Administered 2016-09-28: 4000 [IU] via INTRAVENOUS
  Filled 2016-09-28: qty 4000

## 2016-09-28 MED ORDER — AMIODARONE LOAD VIA INFUSION
150.0000 mg | Freq: Once | INTRAVENOUS | Status: AC
  Administered 2016-09-28: 150 mg via INTRAVENOUS
  Filled 2016-09-28: qty 83.34

## 2016-09-28 MED ORDER — AMIODARONE HCL IN DEXTROSE 360-4.14 MG/200ML-% IV SOLN
30.0000 mg/h | INTRAVENOUS | Status: DC
  Administered 2016-09-29 – 2016-09-30 (×3): 30 mg/h via INTRAVENOUS
  Filled 2016-09-28 (×3): qty 200

## 2016-09-28 MED ORDER — AMIODARONE HCL IN DEXTROSE 360-4.14 MG/200ML-% IV SOLN
60.0000 mg/h | INTRAVENOUS | Status: AC
  Administered 2016-09-28: 60 mg/h via INTRAVENOUS
  Filled 2016-09-28 (×2): qty 200

## 2016-09-28 NOTE — Progress Notes (Signed)
ANTICOAGULATION CONSULT NOTE - Initial Consult  Pharmacy Consult for heparin  Indication: atrial fibrillation  Allergies  Allergen Reactions  . Codeine Other (See Comments)    agitation    Patient Measurements: Height: 6' (182.9 cm) Weight: 204 lb 8 oz (92.8 kg) IBW/kg (Calculated) : 77.6  Vital Signs: Temp: 98.3 F (36.8 C) (05/10 1933) Temp Source: Oral (05/10 1624) BP: 123/77 (05/10 1933) Pulse Rate: 130 (05/10 1933)  Labs:  Recent Labs  09/26/16 1636 09/27/16 0418 09/27/16 1327 09/28/16 0610  HGB 12.0*  --   --   --   HCT 38.8*  --   --   --   PLT 244  --   --   --   CREATININE 1.41* 1.56* 1.33* 1.63*  TROPONINI 0.07*  --   --   --     Estimated Creatinine Clearance: 50.3 mL/min (A) (by C-G formula based on SCr of 1.63 mg/dL (H)).   Medical History: Past Medical History:  Diagnosis Date  . AF (atrial fibrillation) ([place])   . CHF (congestive heart failure) ([place])   . Dyspnea   . Enlarged heart   . Hypertension     Medications:  Prescriptions Prior to Admission  Medication Sig Dispense Refill Last Dose  . ALPRAZolam (XANAX) 0.25 MG tablet Take 0.25 mg by mouth at bedtime as needed for anxiety.   09/25/2016 at Unknown time  . amiodarone (PACERONE) 200 MG tablet Take 200 mg by mouth daily.  0 09/26/2016 at Unknown time  . aspirin 81 MG chewable tablet Chew 81 mg by mouth daily.   09/26/2016 at Unknown time  . furosemide (LASIX) 40 MG tablet Take 1 tablet (40 mg total) by mouth daily. 30 tablet 0 09/26/2016 at Unknown time  . losartan (COZAAR) 25 MG tablet Take 1 tablet (25 mg total) by mouth daily. 30 tablet 0 09/26/2016 at Unknown time  . spironolactone (ALDACTONE) 25 MG tablet Take 25 mg by mouth daily.   09/26/2016 at Unknown time   Scheduled:  . amiodarone  150 mg Intravenous Once  . aspirin  81 mg Oral Daily  . enoxaparin (LOVENOX) injection  40 mg Subcutaneous Q24H  . potassium chloride  40 mEq Oral TID  . sodium chloride flush  10-40 mL Intracatheter Q12H     Assessment: 66 yo male with acute systolic HF on milrinone noted with afib. Pharmacy consulted to dose heparin  -Lovenox 40mg  [place] given 5/9 at ~ 10pm  Goal of Therapy:  Heparin level 0.3-0.7 units/ml Monitor platelets by anticoagulation protocol: Yes   Plan:  -Heparin bolus 40000 units IV followed by 1300 units/hr (~ 14 units/kg/hr) -Heparin level in 8 hours and daily wth CBC daily  Hildred Laser, Pharm D 09/28/2016 8:10 PM

## 2016-09-28 NOTE — Progress Notes (Signed)
Advanced Heart Failure Rounding Note  PCP: [place]  Primary Cardiologist: Dr Haroldine Laws  Subjective:    Admitted from clinic 5/8 with volume overload, low output.   Co ox 29%, milrinone added yesterday, co ox 66 today.  Weight down 20 pounds. -8.6L.   Feels better today, no SOB or orthopnea. CVP 4.   Objective:   Weight Range: 204 lb 8 oz (92.8 kg) Body mass index is 27.74 kg/m.   Vital Signs:   Temp:  [97.6 F (36.4 C)-98.5 F (36.9 C)] 97.7 F (36.5 C) (05/10 0430) Pulse Rate:  [78-83] 81 (05/10 0430) Resp:  [14-18] 15 (05/10 0430) BP: (129-141)/(80-94) 129/90 (05/10 0430) SpO2:  [95 %-100 %] 99 % (05/10 0430) Weight:  [204 lb 8 oz (92.8 kg)] 204 lb 8 oz (92.8 kg) (05/10 0430) Last BM Date: 09/26/16  Weight change: Filed Weights   09/26/16 1727 09/27/16 0359 09/28/16 0430  Weight: 224 lb 6 oz (101.8 kg) 222 lb 6.4 oz (100.9 kg) 204 lb 8 oz (92.8 kg)    Intake/Output:   Intake/Output Summary (Last 24 hours) at 09/28/16 0706 Last data filed at 09/28/16 0500  Gross per 24 hour  Intake           800.05 ml  Output             8150 ml  Net         -7349.95 ml     Physical Exam: General:  Male, NAD, lying in bed.  HEENT: Normal.  Neck: supple. JVP 5-6 cm.  . Carotids 2+ bilat; no bruits. No lymphadenopathy or thyromegaly appreciated. Cor: PMI laterally displaced.Regular rate and rhythm. No rubs, gallops or murmurs. Lungs: Clear in upper lobes, diminished crackles in bilateral bases.  Abdomen: soft, nontender, mildly distended. No hepatosplenomegaly. No bruits or masses. Good bowel sounds. Extremities: no cyanosis, clubbing, rash. Warm, 2+ edema to knees.  Neuro: alert & orientedx3, lethargic. cranial nerves grossly intact. moves all 4 extremities w/o difficulty. Affect pleasant   Telemetry: NSR, frequent PVC's.  Labs: CBC  Recent Labs  09/26/16 1636  WBC 9.3  HGB 12.0*  HCT 38.8*  MCV 85.7  PLT 161   Basic Metabolic Panel  Recent  Labs  09/27/16 0418 09/27/16 1327  NA 138 139  K 3.8 2.8*  CL 101 105  CO2 26 25  GLUCOSE 106* 98  BUN 28* 24*  CREATININE 1.56* 1.33*  CALCIUM 9.2 8.4*   Liver Function Tests  Recent Labs  09/26/16 1636  AST 60*  ALT 28  ALKPHOS 65  BILITOT 2.0*  PROT 6.5  ALBUMIN 3.2*   Cardiac Enzymes  Recent Labs  09/26/16 1636  TROPONINI 0.07*    BNP: BNP (last 3 results)  Recent Labs  08/12/16 1637 09/26/16 1636  BNP 2,520.0* 3,415.8*    Thyroid Function Tests  Recent Labs  09/26/16 1636  TSH 5.036*    Imaging/Studies:  No results found.    Medications:     Scheduled Medications: . aspirin  81 mg Oral Daily  . enoxaparin (LOVENOX) injection  40 mg Subcutaneous Q24H  . furosemide  80 mg Intravenous Q8H  . potassium chloride  40 mEq Oral TID  . sodium chloride flush  10-40 mL Intracatheter Q12H    Infusions: . sodium chloride    . milrinone 0.25 mcg/kg/min (09/27/16 1912)    PRN Medications: sodium chloride, acetaminophen, ALPRAZolam, ondansetron (ZOFRAN) IV, sodium chloride flush   Assessment/Plan   1.Acute on chronic  biventriacular systolic heart failure: Etiology unclear. ECHO 08/13/2016 - LVEF 10-15% + moderately dilated RV.  - NYHA IV.  - Volume status much improved, still with edema in BLE.  - Continue milrinone 0.25 mcg  - Decrease lasix to 80mg  IV BID.   - No beta blocker with decompensation.  - No ACE/ARB due to CKD - Not a candidate for advanced therapies with RV dysfunction, lack of support and on going tobacco abuse.  2. PAF:  - maintaining NSR now, but did require Amio gtt last admit when started on milrinone.  - Was not on anticoagulation outpatient due to non compliance.  - no change to current plan.  3. CKD Stage III:  - Creatinine improving with diuresis.  - baseline 1.3-1.5 4. Tobacco Abuse - Encouraged cessation. - He expressed the desire to quit smoking today.  5. Hypokalemia - BMET pending. - Continue  supplement.   Length of Stay: [place], NP  09/28/2016, 7:06 AM Advanced Heart Failure Team  Pager [phone] M-F 7am-4pm.  Please contact [facility] for night-coverage after hours (4p -7a ) and weekends on [email or web address]  CVP down. Co-ox good. Will stop IV lasix. This evening developed AFL with RVR. We started IV amio. Rates now 110-120. Hopefully will convert soon. supp electrolytes. Has h/o PAF but not on AC due to non-compliance if AFL continues overnight will start heparin in am. Poor candidate for advanced therapies. May be nearing point for Hospice. Renal function stable. Discussed AC with PharmD.  Glori Bickers, MD  9:25 PM

## 2016-09-28 NOTE — Progress Notes (Addendum)
    Called by [place], RN for rhythm change with WCT. He has as RBBB at baseline. He has a history of going into ATF vs A tach on milrinone and placed on IV amio previously. I will start this now as well as IV heparin.   Angelena Form PA-C  [facility]

## 2016-09-28 NOTE — Progress Notes (Signed)
Patient with rhythm change about twenty minutes ago.  EKG confirmed wide QRS tachycardia with PVCs.  Heart rate sustained 120-140s.  Patient is asymptomatic.  Cardiology night call APP paged with this information via Amion.  Per Amion on call is John Hicks.

## 2016-09-28 NOTE — Plan of Care (Signed)
Problem: Safety: Goal: Ability to remain free from injury will improve Outcome: Progressing Fall risk bundle in place. No injuries this shift.   Problem: Tissue Perfusion: Goal: Risk factors for ineffective tissue perfusion will decrease Outcome: Progressing BLE edema decreased from +3 to +2.  Tissue perfusion improving.

## 2016-09-29 DIAGNOSIS — I480 Paroxysmal atrial fibrillation: Secondary | ICD-10-CM

## 2016-09-29 DIAGNOSIS — I5023 Acute on chronic systolic (congestive) heart failure: Secondary | ICD-10-CM | POA: Diagnosis not present

## 2016-09-29 LAB — BASIC METABOLIC PANEL
ANION GAP: 10 (ref 5–15)
BUN: 28 mg/dL — AB (ref 6–20)
CALCIUM: 9.2 mg/dL (ref 8.9–10.3)
CO2: 29 mmol/L (ref 22–32)
CREATININE: 1.52 mg/dL — AB (ref 0.61–1.24)
Chloride: 97 mmol/L — ABNORMAL LOW (ref 101–111)
GFR calc Af Amer: 54 mL/min — ABNORMAL LOW (ref >60)
GFR, EST NON AFRICAN AMERICAN: 47 mL/min — AB (ref >60)
GLUCOSE: 112 mg/dL — AB (ref 65–99)
Potassium: 3.7 mmol/L (ref 3.5–5.1)
Sodium: 136 mmol/L (ref 135–145)

## 2016-09-29 LAB — CBC
HEMATOCRIT: 40.5 % (ref 39.0–52.0)
Hemoglobin: 12.4 g/dL — ABNORMAL LOW (ref 13.0–17.0)
MCH: 26.7 pg (ref 26.0–34.0)
MCHC: 30.6 g/dL (ref 30.0–36.0)
MCV: 87.3 fL (ref 78.0–100.0)
Platelets: 244 10*3/uL (ref 150–400)
RBC: 4.64 MIL/uL (ref 4.22–5.81)
RDW: 19.5 % — ABNORMAL HIGH (ref 11.5–15.5)
WBC: 10.4 10*3/uL (ref 4.0–10.5)

## 2016-09-29 LAB — HEPARIN LEVEL (UNFRACTIONATED): Heparin Unfractionated: 0.11 IU/mL — ABNORMAL LOW (ref 0.30–0.70)

## 2016-09-29 LAB — COOXEMETRY PANEL
Carboxyhemoglobin: 1.6 % — ABNORMAL HIGH (ref 0.5–1.5)
Methemoglobin: 0.9 % (ref 0.0–1.5)
O2 Saturation: 67.3 %
Total hemoglobin: 12.9 g/dL (ref 12.0–16.0)

## 2016-09-29 MED ORDER — ALPRAZOLAM 0.5 MG PO TABS: 0.5000 mg | ORAL_TABLET | Freq: Three times a day (TID) | ORAL | Status: DC | PRN

## 2016-09-29 MED ORDER — MILRINONE LACTATE IN DEXTROSE 20-5 MG/100ML-% IV SOLN
0.1250 ug/kg/min | INTRAVENOUS | Status: DC
  Administered 2016-09-29 – 2016-10-01 (×2): 0.125 ug/kg/min via INTRAVENOUS
  Filled 2016-09-29 (×2): qty 100

## 2016-09-29 MED ORDER — ALPRAZOLAM 0.5 MG PO TABS: 2.0000 mg | ORAL_TABLET | Freq: Every evening | ORAL | Status: DC | PRN

## 2016-09-29 MED ORDER — POTASSIUM CHLORIDE CRYS ER 20 MEQ PO TBCR
40.0000 meq | EXTENDED_RELEASE_TABLET | Freq: Two times a day (BID) | ORAL | Status: DC
  Administered 2016-09-29 – 2016-10-01 (×5): 40 meq via ORAL
  Filled 2016-09-29 (×5): qty 2

## 2016-09-29 MED ORDER — ALPRAZOLAM 0.5 MG PO TABS
1.0000 mg | ORAL_TABLET | Freq: Three times a day (TID) | ORAL | Status: DC
  Administered 2016-09-29 – 2016-10-02 (×9): 1 mg via ORAL
  Filled 2016-09-29 (×9): qty 2

## 2016-09-29 NOTE — Progress Notes (Signed)
Advanced Heart Failure Rounding Note  PCP: [place]  Primary Cardiologist: Dr Haroldine Laws  Subjective:    Admitted from clinic 5/8 with volume overload, low output.   Co ox 67, weight down 25 pounds total. CVP 1.   Feels less SOB today, no orthopnea or PND. Denies chest pain.   Objective:   Weight Range: 199 lb (90.3 kg) Body mass index is 26.99 kg/m.   Vital Signs:   Temp:  [97.7 F (36.5 C)-98.8 F (37.1 C)] 98.2 F (36.8 C) (05/11 0747) Pulse Rate:  [74-130] 78 (05/11 0747) Resp:  [14-16] 16 (05/11 0747) BP: (97-123)/(63-89) 120/79 (05/11 0747) SpO2:  [92 %-98 %] 92 % (05/11 0747) Weight:  [199 lb (90.3 kg)] 199 lb (90.3 kg) (05/11 0429) Last BM Date: 09/28/16  Weight change: Filed Weights   09/27/16 0359 09/28/16 0430 09/29/16 0429  Weight: 222 lb 6.4 oz (100.9 kg) 204 lb 8 oz (92.8 kg) 199 lb (90.3 kg)    Intake/Output:   Intake/Output Summary (Last 24 hours) at 09/29/16 0838 Last data filed at 09/29/16 0700  Gross per 24 hour  Intake          1822.92 ml  Output             2975 ml  Net         -1152.08 ml     Physical Exam: General: Ill appearing. No resp difficulty. HEENT: normal Neck: supple. No JVD.. Carotids 2+ bilat; no bruits. No thyromegaly or nodule noted. Cor: PMI laterally displaced. RRR, No M/G/R noted Lungs: CTAB, normal effort. Abdomen: soft, non-tender, distended, no HSM. No bruits or masses. +BS  Extremities: no cyanosis, clubbing, rash. 1+ pretibial edema.  Neuro: alert & orientedx3, cranial nerves grossly intact. moves all 4 extremities w/o difficulty. Affect pleasant    Telemetry: Converted to NSR at 5:30 am.   Labs: CBC  Recent Labs  09/26/16 1636 09/29/16 0520  WBC 9.3 10.4  HGB 12.0* 12.4*  HCT 38.8* 40.5  MCV 85.7 87.3  PLT 244 163   Basic Metabolic Panel  Recent Labs  09/28/16 0610 09/29/16 0520  NA 139 136  K 3.5 3.7  CL 98* 97*  CO2 31 29  GLUCOSE 103* 112*  BUN 29* 28*  CREATININE  1.63* 1.52*  CALCIUM 9.6 9.2  MG 2.0  --    Liver Function Tests  Recent Labs  09/26/16 1636  AST 60*  ALT 28  ALKPHOS 65  BILITOT 2.0*  PROT 6.5  ALBUMIN 3.2*   Cardiac Enzymes  Recent Labs  09/26/16 1636  TROPONINI 0.07*    BNP: BNP (last 3 results)  Recent Labs  08/12/16 1637 09/26/16 1636  BNP 2,520.0* 3,415.8*    Thyroid Function Tests  Recent Labs  09/26/16 1636  TSH 5.036*    Imaging/Studies:  No results found.    Medications:     Scheduled Medications: . aspirin  81 mg Oral Daily  . potassium chloride  40 mEq Oral TID  . sodium chloride flush  10-40 mL Intracatheter Q12H    Infusions: . sodium chloride    . amiodarone 30 mg/hr (09/29/16 0731)  . heparin 1,500 Units/hr (09/29/16 8453)  . milrinone 0.25 mcg/kg/min (09/28/16 2247)    PRN Medications: sodium chloride, acetaminophen, ALPRAZolam, ondansetron (ZOFRAN) IV, sodium chloride flush   Assessment/Plan   1.Acute on chronic biventriacular systolic heart failure: Etiology unclear. ECHO 08/13/2016 - LVEF 10-15% + moderately dilated RV.  - NYHA IV.  -  Volume status much improved - Decrease milrinone to 0.125 mcg.  - No lasix today.   - No beta blocker with decompensation.  - No ACE/ARB due to CKD - Not a candidate for advanced therapies with RV dysfunction, lack of support and on going tobacco abuse.  2. PAF: - developed rapid afib yesterday evening.  - Amio added, now in NSR - Stop heparin gtt, not a candidate for long term anticoagulation - Can likely transition to po Amio tomorrow.  3. CKD Stage III:  - Stable, nearing baseline.  4. Tobacco Abuse - Encouraged cessation. - He expressed the desire to quit smoking today.  - No change to current plan.  5. Hypokalemia - K 3.7, stable.    Length of Stay: [place], NP  09/29/2016, 8:38 AM Advanced Heart Failure Team  Pager [phone] M-F 7am-4pm.  Please contact [facility] for night-coverage after hours  (4p -7a ) and weekends on [email or web address]  Patient seen and examined with Jettie Booze, NP. We discussed all aspects of the encounter. I agree with the assessment and plan as stated above.   Remains tenuous. Volume status much improved. Will begin milrinone wean. Hold lasix today. Likely start po in am. Supp K.  Was in AF last night but now resolved. Not candidate for long-term AC. Will stop heparin   Long talk about prognosis. He is not candidate for advanced therapies due to poor support systems. Will have palliative care team see. Discussed with Dr. Hilma Favors.   Glori Bickers, MD  5:32 PM

## 2016-09-29 NOTE — Progress Notes (Signed)
Patient back in normal sinus rhythm, EKG confirmed.  Will continue to monitor.

## 2016-09-29 NOTE — Evaluation (Signed)
Physical Therapy Evaluation Patient Details Name: John Hicks MRN: [id number] DOB: Jul 06, 1950 Today's Date: 09/29/2016   History of Present Illness  Patient is a 66 yo male admitted 09/26/16 from HF Clinic with A/C biventricular heart failure.   PMH:  HTN, biventricular heart failure, PAF, and aortic root aneurysm.  Clinical Impression  Patient is functioning at independent to supervision level with all mobility and gait. Patient able to ambulate 76' with supervision and no assistive device. Gait slightly unsteady at times, but with no overt loss of balance.  Recommend cane for home for balance/safety.  Encouraged patient to ambulate in hallway with nursing.  No further acute PT needs identified - PT will sign off.    Follow Up Recommendations No PT follow up;Supervision - Intermittent    Equipment Recommendations  Cane    Recommendations for Other Services       Precautions / Restrictions Precautions Precautions: None Restrictions Weight Bearing Restrictions: No      Mobility  Bed Mobility Overal bed mobility: Independent                Transfers Overall transfer level: Independent Equipment used: None                Ambulation/Gait Ambulation/Gait assistance: Supervision Ambulation Distance (Feet): 160 Feet Assistive device: None (Occasionally used rail in hallway) Gait Pattern/deviations: Step-through pattern;Decreased stride length;Drifts right/left Gait velocity: decreased Gait velocity interpretation: Below normal speed for age/gender General Gait Details: Patient with slow, slightly unsteady gait.  Reaches for rail in hallway.  Patient able to use pursed-lip breathing during gait.  No loss of balance.  Stairs            Wheelchair Mobility    Modified Rankin (Stroke Patients Only)       Balance Overall balance assessment: Needs assistance         Standing balance support: No upper extremity supported;During functional  activity Standing balance-Leahy Scale: Good Standing balance comment: Slightly unsteady during gait/dynamic activities.                             Pertinent Vitals/Pain Pain Assessment: No/denies pain    Home Living Family/patient expects to be discharged to:: Private residence Living Arrangements: Non-relatives/Friends (Patient lives with [place]) Available Help at Discharge: Family;Friend(s);Available PRN/intermittently (Landlord at the home except some weekends.) Type of Home: House Home Access: Stairs to enter Entrance Stairs-Rails: None (Holds on to porch supports) Technical brewer of Steps: 3 Home Layout: Two level;Laundry or work area in [employer]: None      Prior Function Level of Independence: Independent;Needs assistance   Gait / Transfers Assistance Needed: Ambulates independently  ADL's / Homemaking Assistance Needed: Needs assist for transportation        Hand Dominance   Dominant Hand: Right    Extremity/Trunk Assessment   Upper Extremity Assessment Upper Extremity Assessment: Overall WFL for tasks assessed    Lower Extremity Assessment Lower Extremity Assessment: Generalized weakness       Communication   Communication: No difficulties  Cognition Arousal/Alertness: Awake/alert Behavior During Therapy: WFL for tasks assessed/performed;Anxious Overall Cognitive Status: Within Functional Limits for tasks assessed                                        General Comments      Exercises  Assessment/Plan    PT Assessment Patent does not need any further PT services  PT Problem List         PT Treatment Interventions      PT Goals (Current goals can be found in the Care Plan section)  Acute Rehab PT Goals PT Goal Formulation: All assessment and education complete, DC therapy    Frequency     Barriers to discharge        Co-evaluation               AM-PAC PT "6 Clicks" Daily  Activity  Outcome Measure Difficulty turning over in bed (including adjusting bedclothes, sheets and blankets)?: None Difficulty moving from lying on back to sitting on the side of the bed? : None Difficulty sitting down on and standing up from a chair with arms (e.g., wheelchair, bedside commode, etc,.)?: None Help needed moving to and from a bed to chair (including a wheelchair)?: None Help needed walking in hospital room?: A Little Help needed climbing 3-5 steps with a railing? : A Little 6 Click Score: 22    End of Session Equipment Utilized During Treatment: Gait belt Activity Tolerance: Patient tolerated treatment well Patient left: in bed;with call bell/phone within reach Nurse Communication: Mobility status (Encouraged ambulation in hallway.  Needs cane for d/c.) PT Visit Diagnosis: Unsteadiness on feet (R26.81);Muscle weakness (generalized) (M62.81)    Time: 4401-0272 PT Time Calculation (min) (ACUTE ONLY): 34 min   Charges:   PT Evaluation $PT Eval Moderate Complexity: 1 Procedure PT Treatments $Gait Training: 8-22 mins   PT G Codes:        Carita Pian. Sanjuana Kava, [facility] Acute Rehab Services Pager [place] 09/29/2016, 8:37 PM

## 2016-09-29 NOTE — Plan of Care (Signed)
Problem: Education: Goal: Knowledge of [facility] Education information/materials will improve Outcome: Progressing Patient aware of plan of care.  RN provided medication education on all medications administered thus far this shift.

## 2016-09-29 NOTE — Progress Notes (Signed)
ANTICOAGULATION CONSULT NOTE - Follow Up Consult  Pharmacy Consult for Heparin  Indication: atrial fibrillation  Allergies  Allergen Reactions  . Codeine Other (See Comments)    agitation    Patient Measurements: Height: 6' (182.9 cm) Weight: 199 lb (90.3 kg) IBW/kg (Calculated) : 77.6  Vital Signs: Temp: 98.8 F (37.1 C) (05/11 0429) BP: 97/81 (05/11 0429) Pulse Rate: 116 (05/11 0429)  Labs:  Recent Labs  09/26/16 1636  09/27/16 1327 09/28/16 0610 09/29/16 0520  HGB 12.0*  --   --   --  12.4*  HCT 38.8*  --   --   --  40.5  PLT 244  --   --   --  244  HEPARINUNFRC  --   --   --   --  0.11*  CREATININE 1.41*  < > 1.33* 1.63* 1.52*  TROPONINI 0.07*  --   --   --   --   < > = values in this interval not displayed.  Estimated Creatinine Clearance: 53.9 mL/min (A) (by C-G formula based on SCr of 1.52 mg/dL (H)).  Assessment: 66 y/o M on heparin for afib, initial heparin level is low, no issues per RN.   Goal of Therapy:  Heparin level 0.3-0.7 units/ml Monitor platelets by anticoagulation protocol: Yes   Plan:  -Inc heparin to 1500 units/hr -1400 HL  Narda Bonds 09/29/2016,6:08 AM

## 2016-09-30 DIAGNOSIS — I5023 Acute on chronic systolic (congestive) heart failure: Secondary | ICD-10-CM | POA: Diagnosis not present

## 2016-09-30 DIAGNOSIS — I480 Paroxysmal atrial fibrillation: Secondary | ICD-10-CM | POA: Diagnosis not present

## 2016-09-30 LAB — COOXEMETRY PANEL
Carboxyhemoglobin: 1.7 % — ABNORMAL HIGH (ref 0.5–1.5)
METHEMOGLOBIN: 0.7 % (ref 0.0–1.5)
O2 Saturation: 51.6 %
Total hemoglobin: 12.8 g/dL (ref 12.0–16.0)

## 2016-09-30 LAB — CBC
HEMATOCRIT: 40.9 % (ref 39.0–52.0)
HEMOGLOBIN: 12.4 g/dL — AB (ref 13.0–17.0)
MCH: 26.6 pg (ref 26.0–34.0)
MCHC: 30.3 g/dL (ref 30.0–36.0)
MCV: 87.6 fL (ref 78.0–100.0)
Platelets: 227 10*3/uL (ref 150–400)
RBC: 4.67 MIL/uL (ref 4.22–5.81)
RDW: 19.7 % — ABNORMAL HIGH (ref 11.5–15.5)
WBC: 9.2 10*3/uL (ref 4.0–10.5)

## 2016-09-30 LAB — BASIC METABOLIC PANEL
ANION GAP: 10 (ref 5–15)
BUN: 28 mg/dL — AB (ref 6–20)
CHLORIDE: 98 mmol/L — AB (ref 101–111)
CO2: 28 mmol/L (ref 22–32)
Calcium: 9.1 mg/dL (ref 8.9–10.3)
Creatinine, Ser: 1.39 mg/dL — ABNORMAL HIGH (ref 0.61–1.24)
GFR calc Af Amer: >60 mL/min (ref >60)
GFR calc non Af Amer: 52 mL/min — ABNORMAL LOW (ref >60)
GLUCOSE: 104 mg/dL — AB (ref 65–99)
POTASSIUM: 4.1 mmol/L (ref 3.5–5.1)
Sodium: 136 mmol/L (ref 135–145)

## 2016-09-30 MED ORDER — FUROSEMIDE 10 MG/ML IJ SOLN
80.0000 mg | Freq: Two times a day (BID) | INTRAMUSCULAR | Status: DC
  Administered 2016-09-30 – 2016-10-01 (×2): 80 mg via INTRAVENOUS
  Filled 2016-09-30 (×2): qty 8

## 2016-09-30 MED ORDER — ISOSORBIDE MONONITRATE ER 30 MG PO TB24
30.0000 mg | ORAL_TABLET | Freq: Every day | ORAL | Status: DC
Start: 2016-09-30 — End: 2016-10-02
  Administered 2016-09-30 – 2016-10-02 (×3): 30 mg via ORAL
  Filled 2016-09-30 (×3): qty 1

## 2016-09-30 MED ORDER — HYDRALAZINE HCL 25 MG PO TABS
12.5000 mg | ORAL_TABLET | Freq: Three times a day (TID) | ORAL | Status: DC
  Administered 2016-09-30 – 2016-10-01 (×3): 12.5 mg via ORAL
  Filled 2016-09-30 (×3): qty 1

## 2016-09-30 MED ORDER — AMIODARONE HCL 200 MG PO TABS
200.0000 mg | ORAL_TABLET | Freq: Two times a day (BID) | ORAL | Status: DC
  Administered 2016-09-30 – 2016-10-02 (×5): 200 mg via ORAL
  Filled 2016-09-30 (×5): qty 1

## 2016-09-30 NOTE — Progress Notes (Signed)
Advanced Heart Failure Rounding Note  PCP: [place]  Primary Cardiologist: Dr Haroldine Laws  Subjective:    Admitted from clinic 5/8 with volume overload, low output.   Diuresed well on milrinone. Milrinone turned down to 0.125 yesterday. Co-ox 67%-> 52%. Weight stable.   Feels bloated. CVP up to 10-11. Denies orthopnea or PND.   Met with Palliative care team yesterday.    Objective:   Weight Range: 90.8 kg (200 lb 3.2 oz) Body mass index is 27.15 kg/m.   Vital Signs:   Temp:  [97.6 F (36.4 C)-98.2 F (36.8 C)] 97.6 F (36.4 C) (05/12 1135) Pulse Rate:  [72-103] 73 (05/12 1135) Resp:  [12-19] 14 (05/12 1135) BP: (93-127)/(72-97) 118/91 (05/12 1135) SpO2:  [96 %-100 %] 97 % (05/12 1135) Weight:  [90.8 kg (200 lb 3.2 oz)] 90.8 kg (200 lb 3.2 oz) (05/12 0456) Last BM Date: 09/28/16  Weight change: Filed Weights   09/28/16 0430 09/29/16 0429 09/30/16 0456  Weight: 92.8 kg (204 lb 8 oz) 90.3 kg (199 lb) 90.8 kg (200 lb 3.2 oz)    Intake/Output:   Intake/Output Summary (Last 24 hours) at 09/30/16 1155 Last data filed at 09/30/16 1000  Gross per 24 hour  Intake          2164.89 ml  Output              800 ml  Net          1364.89 ml     Physical Exam: General: Lying flat in bed . No resp difficulty. HEENT: normal Neck: supple.  JVP 10-11 Carotids 2+ bilat; no bruits. No thyromegaly or nodule noted. Cor: PMI laterally displaced. RRR, No M/G/R noted Lungs: CTAB, normal effort. Abdomen: NT/ND good BS Extremities: no cyanosis, clubbing, rash. 1-2+ pretibial edema.  Neuro: alert & orientedx3, cranial nerves grossly intact. moves all 4 extremities w/o difficulty. Affect pleasant    Telemetry: NSR 70s Personally reviewed   Labs: CBC  Recent Labs  09/29/16 0520 09/30/16 0502  WBC 10.4 9.2  HGB 12.4* 12.4*  HCT 40.5 40.9  MCV 87.3 87.6  PLT 244 366   Basic Metabolic Panel  Recent Labs  09/28/16 0610 09/29/16 0520 09/30/16 0502  NA  139 136 136  K 3.5 3.7 4.1  CL 98* 97* 98*  CO2 '31 29 28  ' GLUCOSE 103* 112* 104*  BUN 29* 28* 28*  CREATININE 1.63* 1.52* 1.39*  CALCIUM 9.6 9.2 9.1  MG 2.0  --   --    Liver Function Tests No results for input(s): AST, ALT, ALKPHOS, BILITOT, PROT, ALBUMIN in the last 72 hours. Cardiac Enzymes No results for input(s): CKTOTAL, CKMB, CKMBINDEX, TROPONINI in the last 72 hours.  BNP: BNP (last 3 results)  Recent Labs  08/12/16 1637 09/26/16 1636  BNP 2,520.0* 3,415.8*    Thyroid Function Tests No results for input(s): TSH, T4TOTAL, T3FREE, THYROIDAB in the last 72 hours.  Invalid input(s): FREET3  Imaging/Studies:  No results found.    Medications:     Scheduled Medications: . ALPRAZolam  1 mg Oral TID  . aspirin  81 mg Oral Daily  . potassium chloride  40 mEq Oral BID  . sodium chloride flush  10-40 mL Intracatheter Q12H    Infusions: . sodium chloride    . amiodarone 30 mg/hr (09/30/16 0923)  . milrinone 0.125 mcg/kg/min (09/29/16 1549)    PRN Medications: sodium chloride, acetaminophen, ALPRAZolam, ondansetron (ZOFRAN) IV, sodium chloride flush   Assessment/Plan  1.Acute on chronic biventriacular systolic heart failure: Etiology unclear. ECHO 08/13/2016 - LVEF 10-15% + moderately dilated RV.  - NYHA IV.  - Volume status trending back up off diuretics. Will resume IV diuretics today and try to switch to po tomorrow - Co-ox down on lower dose milrinone (0.125). He is not candidate for home milrinone and he does not want it either so will need to continue to try and wean. If unable to wean will need to consider hospice.  - No beta blocker with decompensation.  - No ACE/ARB due to CKD -Use hydral/nitrates as BP tolerates  - Not a candidate for advanced therapies with RV dysfunction, lack of support and on going tobacco abuse.  2. PAF: - Has converted back to NSR. Switch to po amio.   - Not a candidate for long term anticoagulation due to compliance  issues 3. CKD Stage III:  - At baseline  4. Tobacco Abuse - Encouraged cessation. 5. Hypokalemia - Improved K 4.16.  6. Goal of Care - Discussed with Dr. Hilma Favors and patient. Very much appreciate their input  Length of Stay: 4   Glori Bickers, MD  09/30/2016, 11:55 AM Advanced Heart Failure Team  Pager [phone] M-F 7am-4pm.  Please contact [facility] for night-coverage after hours (4p -7a ) and weekends on [email or web address]

## 2016-09-30 NOTE — Plan of Care (Signed)
Problem: Activity: Goal: Risk for activity intolerance will decrease Outcome: Progressing Patient ambulated in hall this evening with physical therapy.  Patient denying shortness of breath today.

## 2016-09-30 NOTE — Consult Note (Signed)
Palliative Care Consultation Requested by: Cardiology Reason: Goals of Care, ACP  John Hicks is a 66 yo Veteran from [place], [place] admitted from [place] [facility] clinic with volume overload and has hx of low output heart failure, NYHA IV. Palliative consultation requested for anticipatory care planning.  I had a straight forward and open conversation with John Hicks about his condition, his understanding of the severity of his HF and his expectations for the future. He understands how seriously ill he is and the amount of support he will need to manage his heart failure. He has multiple social challenges and financial struggles.  1. Code Status: We discussed his code status in detail he tells me he has told his doctors here what he wants. He understands if his condition deteriorates to the point of needing ACLS his chance of meaningful recovery is very low. He tells me he never wants to be on a life support machine unless it is short term and he can recover.If he cannot take care of himself he wants to be allowed to have a natural death- and not have his life prolonged under those circumstances. He wants full scope treatment for all reversible illness. He should however have an out of hospital DNR-for now he would want intervention for life threatening arhythmia and short term respiratory failure support should cardiac arrest or acute problem occurs. I have left him Full Code status for now so that there is no confusion over this request in the hospital- his main concern is that any resuscitation attempt not go too far. I discussed a MOST form with him and will complete this before he leaves the hospital. He tells me there is no one he would trust enough to make medical decisions for him if he were unable to do so-this is even more reason why we need to get his wishes in writing.  2. Symptom Management:  He has severe anxiety. He has been on benzodiazapines for 20+ years prescribed by the [place]- he is likely service connected  for mental health issues related to his service in [place]. He takes a large amount of alprazolam for panic disorder. We discussed a better regimen- he has tolerance. He is tremulous and anxious in general, given his overall condition, I will make his dose more effective- Increased to 1mg  scheduled TID and 2mg  PRN QHS for anxiety and sleep. He does not complain of pain.   3. Social Needs: He is worried about not being insured- he needs financial assistance counsleor to talk with him in the hospital- he would certainly qualify for disability in his current condition and there may be other resources available. He is hesitant to have people come to his home for home care-his landlord does not like people out at the house. He wants to live well for the time he has left-he just bought a new truck and wants to take some trips.  We will continue to support John Hicks while he is in the hospital- planning and documentation of his wishes will be critical for him since he doesn't have good family support or another advocate.  Thank you for this consultation.  Lane Hacker, DO Palliative Medicine [phone]

## 2016-10-01 DIAGNOSIS — N179 Acute kidney failure, unspecified: Secondary | ICD-10-CM | POA: Diagnosis not present

## 2016-10-01 DIAGNOSIS — I5023 Acute on chronic systolic (congestive) heart failure: Secondary | ICD-10-CM | POA: Diagnosis not present

## 2016-10-01 LAB — BASIC METABOLIC PANEL
ANION GAP: 9 (ref 5–15)
BUN: 29 mg/dL — ABNORMAL HIGH (ref 6–20)
CALCIUM: 9.6 mg/dL (ref 8.9–10.3)
CO2: 27 mmol/L (ref 22–32)
CREATININE: 1.63 mg/dL — AB (ref 0.61–1.24)
Chloride: 101 mmol/L (ref 101–111)
GFR, EST AFRICAN AMERICAN: 50 mL/min — AB (ref >60)
GFR, EST NON AFRICAN AMERICAN: 43 mL/min — AB (ref >60)
Glucose, Bld: 106 mg/dL — ABNORMAL HIGH (ref 65–99)
Potassium: 4.3 mmol/L (ref 3.5–5.1)
Sodium: 137 mmol/L (ref 135–145)

## 2016-10-01 LAB — CBC
HCT: 40 % (ref 39.0–52.0)
Hemoglobin: 12.1 g/dL — ABNORMAL LOW (ref 13.0–17.0)
MCH: 27.2 pg (ref 26.0–34.0)
MCHC: 30.3 g/dL (ref 30.0–36.0)
MCV: 89.9 fL (ref 78.0–100.0)
PLATELETS: 100 10*3/uL — AB (ref 150–400)
RBC: 4.45 MIL/uL (ref 4.22–5.81)
RDW: 21.4 % — AB (ref 11.5–15.5)
WBC: 8.3 10*3/uL (ref 4.0–10.5)

## 2016-10-01 LAB — COOXEMETRY PANEL
CARBOXYHEMOGLOBIN: 2 % — AB (ref 0.5–1.5)
METHEMOGLOBIN: 0.7 % (ref 0.0–1.5)
O2 SAT: 57.9 %
TOTAL HEMOGLOBIN: 12.5 g/dL (ref 12.0–16.0)

## 2016-10-01 MED ORDER — SPIRONOLACTONE 25 MG PO TABS
12.5000 mg | ORAL_TABLET | Freq: Every day | ORAL | Status: DC
  Administered 2016-10-01 – 2016-10-02 (×2): 12.5 mg via ORAL
  Filled 2016-10-01 (×2): qty 1

## 2016-10-01 MED ORDER — HYDRALAZINE HCL 25 MG PO TABS
25.0000 mg | ORAL_TABLET | Freq: Three times a day (TID) | ORAL | Status: DC
  Administered 2016-10-01 – 2016-10-02 (×3): 25 mg via ORAL
  Filled 2016-10-01 (×3): qty 1

## 2016-10-01 MED ORDER — DIGOXIN 125 MCG PO TABS
0.1250 mg | ORAL_TABLET | Freq: Every day | ORAL | Status: DC
  Administered 2016-10-01 – 2016-10-02 (×2): 0.125 mg via ORAL
  Filled 2016-10-01 (×2): qty 1

## 2016-10-01 NOTE — Progress Notes (Addendum)
Advanced Heart Failure Rounding Note  PCP: [place]  Primary Cardiologist: Dr Haroldine Laws  Subjective:    Milrinone turned down on Friday to 0.125 Co-ox was 51% yesterday. Milrinone left at 0.125. IV lasix restarted. Co-ox 58% today  Diuresed well. Weight down 6 pounds. Creatinine up 1.4-> 1.6. Denies orthopnea or PND. No CP.   Met with Palliative care team earlier in the week.    Objective:   Weight Range: 88.1 kg (194 lb 3.2 oz) Body mass index is 26.34 kg/m.   Vital Signs:   Temp:  [97.6 F (36.4 C)-98.2 F (36.8 C)] 98 F (36.7 C) (05/13 0811) Pulse Rate:  [70-78] 70 (05/13 0811) Resp:  [14-20] 16 (05/13 0811) BP: (106-124)/(70-96) 112/96 (05/13 0811) SpO2:  [97 %-98 %] 98 % (05/13 0811) Weight:  [88.1 kg (194 lb 3.2 oz)] 88.1 kg (194 lb 3.2 oz) (05/13 0403) Last BM Date: 09/30/16  Weight change: Filed Weights   09/29/16 0429 09/30/16 0456 10/01/16 0403  Weight: 90.3 kg (199 lb) 90.8 kg (200 lb 3.2 oz) 88.1 kg (194 lb 3.2 oz)    Intake/Output:   Intake/Output Summary (Last 24 hours) at 10/01/16 0918 Last data filed at 10/01/16 0720  Gross per 24 hour  Intake          1456.89 ml  Output             1927 ml  Net          -470.11 ml     Physical Exam: General: Lying flat in bed . No resp difficulty. HEENT: normal Neck: supple.  JVP 10-11 Carotids 2+ bilat; no bruits. No thyromegaly or nodule noted. Cor: PMI laterally displaced. RRR, No M/G/R noted Lungs: CTAB, normal effort. Abdomen: NT/ND good BS Extremities: no cyanosis, clubbing, rash. 1-2+ pretibial edema.  Neuro: alert & orientedx3, cranial nerves grossly intact. moves all 4 extremities w/o difficulty. Affect pleasant    Telemetry: NSR 70s Personally reviewed   Labs: CBC  Recent Labs  09/30/16 0502 10/01/16 0500  WBC 9.2 8.3  HGB 12.4* 12.1*  HCT 40.9 40.0  MCV 87.6 89.9  PLT 227 366*   Basic Metabolic Panel  Recent Labs  09/30/16 0502 10/01/16 0744  NA 136 137    K 4.1 4.3  CL 98* 101  CO2 28 27  GLUCOSE 104* 106*  BUN 28* 29*  CREATININE 1.39* 1.63*  CALCIUM 9.1 9.6   Liver Function Tests No results for input(s): AST, ALT, ALKPHOS, BILITOT, PROT, ALBUMIN in the last 72 hours. Cardiac Enzymes No results for input(s): CKTOTAL, CKMB, CKMBINDEX, TROPONINI in the last 72 hours.  BNP: BNP (last 3 results)  Recent Labs  08/12/16 1637 09/26/16 1636  BNP 2,520.0* 3,415.8*    Thyroid Function Tests No results for input(s): TSH, T4TOTAL, T3FREE, THYROIDAB in the last 72 hours.  Invalid input(s): FREET3  Imaging/Studies:  No results found.    Medications:     Scheduled Medications: . ALPRAZolam  1 mg Oral TID  . amiodarone  200 mg Oral BID  . aspirin  81 mg Oral Daily  . furosemide  80 mg Intravenous BID  . hydrALAZINE  12.5 mg Oral Q8H  . isosorbide mononitrate  30 mg Oral Daily  . potassium chloride  40 mEq Oral BID  . sodium chloride flush  10-40 mL Intracatheter Q12H    Infusions: . sodium chloride    . milrinone 0.125 mcg/kg/min (10/01/16 0039)    PRN Medications: sodium chloride, acetaminophen,  ALPRAZolam, ondansetron (ZOFRAN) IV, sodium chloride flush   Assessment/Plan   1.Acute on chronic biventriacular systolic heart failure: Etiology unclear. ECHO 08/13/2016 - LVEF 10-15% + moderately dilated RV.  - Remains very tenuous. Co-ox improved but remains marginal despite milrinone at 0.125.  - He is not candidate for home milrinone and he does not want it either. Will turn off today and follow. If co-ox continues to fall will need Hospice. He is adamant that he has to go home tomorrow or Tuesday. Will try to facilitate - Volume status much improved with IV lasix. CVP 5 Creatinine up a bit. Will stop IV lasix. Start po in am.  - No beta blocker with decompensation.  - No ACE/ARB due to A/CKD - Hydral/nitrates started yesterday. Will increase hydralazine to 25 tid. Add dig and spiro as well.  - Not a candidate for  advanced therapies with RV dysfunction, lack of support and on going tobacco abuse.  2. PAF: - Converted back to NSR on IV amio this admit. Now on po. Remains in NSR.   - Not a candidate for long term anticoagulation due to compliance issues 3. Acute on CKD Stage III:  - Creatinine up slightly today. Holding diuretics today. Start po in am 4. Tobacco Abuse - Encouraged cessation. 5. Hypokalemia - Improved K 4.3.  6. Goal of Care - Discussed with Dr. Hilma Favors and patient. Very much appreciate their input. Likely will need Hospice in the next few months.   Length of Stay: 5   Glori Bickers, MD  10/01/2016, 9:18 AM Advanced Heart Failure Team  Pager [phone] M-F 7am-4pm.  Please contact [facility] for night-coverage after hours (4p -7a ) and weekends on [email or web address]

## 2016-10-01 NOTE — Plan of Care (Signed)
Problem: Bowel/Gastric: Goal: Will not experience complications related to bowel motility Outcome: Progressing Per patient had a normal bowel movement today.

## 2016-10-01 NOTE — Progress Notes (Signed)
Responded to consult for anxiety, palliative care, life review, providing spiritual/emotional support and prayer -- which pt appreciated. Pt seems to think he won't be able to recover or necessarily be able to get the kind of care he needs/gets here when he goes home, about 40 mis. away. Pt is [employer], though says he has not been to church in a while. Advised pt he can ask nurse to page chaplain to come visit when desired.   10/01/16 0700  Clinical Encounter Type  Visited With Patient;Health care provider  Visit Type Initial;Psychological support;Spiritual support;Social support  Referral From Nurse  Spiritual Encounters  Spiritual Needs Prayer;Emotional  Stress Factors  Patient Stress Factors Health changes;Loss of control   Gerrit Heck, Chaplain

## 2016-10-02 DIAGNOSIS — I5023 Acute on chronic systolic (congestive) heart failure: Secondary | ICD-10-CM | POA: Diagnosis not present

## 2016-10-02 LAB — BASIC METABOLIC PANEL
ANION GAP: 9 (ref 5–15)
BUN: 31 mg/dL — ABNORMAL HIGH (ref 6–20)
CHLORIDE: 99 mmol/L — AB (ref 101–111)
CO2: 29 mmol/L (ref 22–32)
Calcium: 9.5 mg/dL (ref 8.9–10.3)
Creatinine, Ser: 1.52 mg/dL — ABNORMAL HIGH (ref 0.61–1.24)
GFR calc non Af Amer: 47 mL/min — ABNORMAL LOW (ref >60)
GFR, EST AFRICAN AMERICAN: 54 mL/min — AB (ref >60)
GLUCOSE: 97 mg/dL (ref 65–99)
POTASSIUM: 4.4 mmol/L (ref 3.5–5.1)
Sodium: 137 mmol/L (ref 135–145)

## 2016-10-02 LAB — COOXEMETRY PANEL
Carboxyhemoglobin: 1.6 % — ABNORMAL HIGH (ref 0.5–1.5)
METHEMOGLOBIN: 0.8 % (ref 0.0–1.5)
O2 Saturation: 57.9 %
Total hemoglobin: 13 g/dL (ref 12.0–16.0)

## 2016-10-02 LAB — CBC
HEMATOCRIT: 41.7 % (ref 39.0–52.0)
HEMOGLOBIN: 13 g/dL (ref 13.0–17.0)
MCH: 27.3 pg (ref 26.0–34.0)
MCHC: 31.2 g/dL (ref 30.0–36.0)
MCV: 87.4 fL (ref 78.0–100.0)
Platelets: 203 10*3/uL (ref 150–400)
RBC: 4.77 MIL/uL (ref 4.22–5.81)
RDW: 19.8 % — AB (ref 11.5–15.5)
WBC: 9.5 10*3/uL (ref 4.0–10.5)

## 2016-10-02 MED ORDER — ISOSORBIDE MONONITRATE ER 30 MG PO TB24: 30.0000 mg | ORAL_TABLET | Freq: Every day | ORAL | 6 refills | Status: DC

## 2016-10-02 MED ORDER — SPIRONOLACTONE 25 MG PO TABS: 12.5000 mg | ORAL_TABLET | Freq: Every day | ORAL | 6 refills | Status: DC

## 2016-10-02 MED ORDER — HYDRALAZINE HCL 25 MG PO TABS: 25.0000 mg | ORAL_TABLET | Freq: Three times a day (TID) | ORAL | 6 refills | Status: DC

## 2016-10-02 MED ORDER — DIGOXIN 125 MCG PO TABS: 0.1250 mg | ORAL_TABLET | Freq: Every day | ORAL | 6 refills | Status: DC

## 2016-10-02 MED ORDER — FUROSEMIDE 40 MG PO TABS: 80.0000 mg | ORAL_TABLET | Freq: Every day | ORAL | 0 refills | Status: DC

## 2016-10-02 MED FILL — AMIODARONE HCL 200 MG TAB: 200 | 34 days supply | Qty: 34 | Fill #0

## 2016-10-02 MED FILL — ISOSORBIDE MN ER 30 MG TAB: 30 | 34 days supply | Qty: 34 | Fill #0

## 2016-10-02 MED FILL — hydrALAZINE HCL 25 MG TABS: 25 | 34 days supply | Qty: 102 | Fill #0

## 2016-10-02 MED FILL — FUROSEMIDE 80 MG TABLET: 80 | 100 days supply | Qty: 100 | Fill #0

## 2016-10-02 MED FILL — DIGOXIN 0.125 MG TABLET: 125 | 34 days supply | Qty: 34 | Fill #0

## 2016-10-02 MED FILL — SPIRONOLACTONE 25 MG TABLET: 25 | 34 days supply | Qty: 17 | Fill #0

## 2016-10-02 NOTE — Progress Notes (Signed)
CARDIAC REHAB PHASE I   PRE:  Rate/Rhythm: off tele  BP:  Supine: 125/89  Sitting:   Standing:    SaO2: 97%RA  MODE:  Ambulation: 350 ft   POST:  Rate/Rhythm: 69 per pulse ox  BP:  Supine:   Sitting: 121/86  Standing:    SaO2: 98%RA 1035-1102 Pt walked 350 ft on RA with steady gait. No DOE noted. Re enforced with pt the importance of daily weights. Pt stated he does not have scales and will get some when he picks up meds. Discussed when to call MD with weight gain and importance of watching sodium. Put on his compression hose at this request.   Graylon Good, RN BSN  10/02/2016 11:18 AM

## 2016-10-02 NOTE — Progress Notes (Addendum)
Advanced Heart Failure Rounding Note  PCP: [place]  Primary Cardiologist: Dr Haroldine Laws  Subjective:    Milrinone turned of yesterday Co-ox stable at 58%. Feels good. No orthopnea or PND. CVP 7 Renal functions table. Wants to go home.    Objective:   Weight Range: 88.5 kg (195 lb 3.2 oz) Body mass index is 26.47 kg/m.   Vital Signs:   Temp:  [97.7 F (36.5 C)-98.4 F (36.9 C)] 98 F (36.7 C) (05/14 0425) Pulse Rate:  [64-74] 68 (05/14 0004) Resp:  [16-18] 17 (05/14 0425) BP: (93-134)/(67-96) 134/79 (05/14 0425) SpO2:  [92 %-98 %] 96 % (05/14 0425) Weight:  [88.5 kg (195 lb 3.2 oz)] 88.5 kg (195 lb 3.2 oz) (05/14 0425) Last BM Date: 10/01/16  Weight change: Filed Weights   09/30/16 0456 10/01/16 0403 10/02/16 0425  Weight: 90.8 kg (200 lb 3.2 oz) 88.1 kg (194 lb 3.2 oz) 88.5 kg (195 lb 3.2 oz)    Intake/Output:   Intake/Output Summary (Last 24 hours) at 10/02/16 0642 Last data filed at 10/01/16 2031  Gross per 24 hour  Intake            610.8 ml  Output             2212 ml  Net          -1601.2 ml     Physical Exam: General: Lying flat in bed . No resp difficulty. HEENT: normal anictreic Neck: supple.  JVP 7  Carotids 2+ bilat; no bruits. No thyromegaly or nodule noted. Cor: PMI laterally displaced.RRR no m/r/g  Lungs: clear  Abdomen: NT/ND good BS Extremities: no cyanosis, clubbing, rash. trace edema.  Neuro: alert & orientedx3, cranial nerves grossly intact. moves all 4 extremities w/o difficulty. Affect pleasant    Telemetry: NSR 60-70s Personally reviewed    Labs: CBC  Recent Labs  10/01/16 0500 10/02/16 0453  WBC 8.3 9.5  HGB 12.1* 13.0  HCT 40.0 41.7  MCV 89.9 87.4  PLT 100* 716   Basic Metabolic Panel  Recent Labs  10/01/16 0744 10/02/16 0453  NA 137 137  K 4.3 4.4  CL 101 99*  CO2 27 29  GLUCOSE 106* 97  BUN 29* 31*  CREATININE 1.63* 1.52*  CALCIUM 9.6 9.5   Liver Function Tests No results for input(s):  AST, ALT, ALKPHOS, BILITOT, PROT, ALBUMIN in the last 72 hours. Cardiac Enzymes No results for input(s): CKTOTAL, CKMB, CKMBINDEX, TROPONINI in the last 72 hours.  BNP: BNP (last 3 results)  Recent Labs  08/12/16 1637 09/26/16 1636  BNP 2,520.0* 3,415.8*    Thyroid Function Tests No results for input(s): TSH, T4TOTAL, T3FREE, THYROIDAB in the last 72 hours.  Invalid input(s): FREET3  Imaging/Studies:  No results found.    Medications:     Scheduled Medications: . ALPRAZolam  1 mg Oral TID  . amiodarone  200 mg Oral BID  . aspirin  81 mg Oral Daily  . digoxin  0.125 mg Oral Daily  . hydrALAZINE  25 mg Oral Q8H  . isosorbide mononitrate  30 mg Oral Daily  . sodium chloride flush  10-40 mL Intracatheter Q12H  . spironolactone  12.5 mg Oral Daily    Infusions: . sodium chloride      PRN Medications: sodium chloride, acetaminophen, ALPRAZolam, ondansetron (ZOFRAN) IV, sodium chloride flush   Assessment/Plan   1.Acute on chronic biventriacular systolic heart failure: Etiology unclear. ECHO 08/13/2016 - LVEF 10-15% + moderately dilated RV.  -  Not a candidate for advanced therapies with RV dysfunction, lack of support and on going tobacco abuse.  2. PAF: - Converted back to NSR on IV amio this admit. Now on po. Remains in NSR.   - Not a candidate for long term anticoagulation due to compliance issues 3. Acute on CKD Stage III:  4. Tobacco Abuse - Encouraged cessation. 5. Hypokalemia - Improved K 4.4.  6. Goal of Care - Discussed with Dr. Hilma Favors and patient. Very much appreciate their input. Likely will need Hospice in the next few months.    He is eager to go home. This is about as good as we can get him. Can go home today with close f/u. Will pull PICC>  Home meds.  Lasix 80 daily Spiro 12.5 Hydral 25 tid Imdur 30 Digoxin 0.125 Amio 200 daily ECASA 81  F/u HF clinic 1 week.   Length of Stay: 6   Glori Bickers, MD  10/02/2016, 6:42  AM Advanced Heart Failure Team  Pager [phone] M-F 7am-4pm.  Please contact [facility] for night-coverage after hours (4p -7a ) and weekends on [email or web address]

## 2016-10-02 NOTE — Progress Notes (Addendum)
I received a referral from Jacqlyn Krauss regarding concerns that patient cannot afford medications at his chosen pharmacy.  I have sent discharge prescriptions to be filled through the [facility] until he can get his prescriptions filled through the [facility] as usual.  I have provided the following medications:   Furosemide 80 mg daily Spironolactone 12.5 mg daily Digoxin 0.125 mg daily Isosorbide mononitrate 30 mg daily Hydralazine 25 mg tid  Amiodarone 200 mg daily  I requested the patient to buy his Aspirin 81 mg daily.

## 2016-10-02 NOTE — Discharge Summary (Signed)
Advanced Heart Failure Team  Discharge Summary   Patient ID: John Hicks MRN: [id number], DOB/AGE: 1951-04-12 66 y.o. Admit date: 09/26/2016 D/C date:     10/02/2016   Primary Discharge Diagnoses:  1.Acute on chronic biventriacular systolic heart failure: Etiology unclear. ECHO 08/13/2016 - LVEF 10-15% + moderately dilated RV.  - Not a candidate for advanced therapies with RV dysfunction, lack of support and on going tobacco abuse.  2. PAF: - Amio 200 mg daily. Not a candidate for long term anticoagulation due to compliance issues 3. Acute on CKD Stage III 4. Tobacco Abuse 5. Hypokalemia  Hospital Course:  Mr John Hicks is a 66 year old with a history of HTN, biventricular heart failure, PAF, and aortic root aneurysm admitted from HF clinic with A/C biventricular heart failure.   Admitted from the HF clinic with marked volume overload. Initially diuresed with IV lasix but due to sluggish response PICC was placed to guide therapy. Mixed venous saturation was less the 30 so milrinone was added. As volume status improved milrinone and IV lasix stopped. he transitioned to lasix 80 mg po daily. Overall he diuresed 29 pounds. Renal function was followed closely. Creatinine peaked at 1.6. HF meds adjusted. No BB with low out put. No arb with ckd. He was placed on low dose spiro with ongoing low potassium.   With the addition of milrinone he developed A fib RVR so IV amio was started. He chemically converted and was placed on back on po amio. He is not on anticoagulants due to noncompliance.   Palliative Care consulted for John Hicks. At the conclusion of the meeting he will remain full code but we will follow up in the clinic. Plan to follow him in the HF clinic. He has follow up next week. Plan to check BMET and dig level at that time. He was offered John Hicks however he does not want to pay for it. He is not a candidate for advanced therapies with noncomplianced and lack of social support.   Discharge Weight:  195  pounds  Discharge Vitals: Blood pressure 134/79, pulse 66, temperature 98 F (36.7 C), temperature source Oral, resp. rate 17, height 6' (1.829 m), weight 195 lb 3.2 oz (88.5 kg), SpO2 96 %.  Labs: Lab Results  Component Value Date   WBC 9.5 10/02/2016   HGB 13.0 10/02/2016   HCT 41.7 10/02/2016   MCV 87.4 10/02/2016   PLT 203 10/02/2016    Recent Labs Lab 09/26/16 1636  10/02/16 0453  NA 134*  < > 137  K 5.9*  < > 4.4  CL 103  < > 99*  CO2 21*  < > 29  BUN 25*  < > 31*  CREATININE 1.41*  < > 1.52*  CALCIUM 9.0  < > 9.5  PROT 6.5  --   --   BILITOT 2.0*  --   --   ALKPHOS 65  --   --   ALT 28  --   --   AST 60*  --   --   GLUCOSE 103*  < > 97  < > = values in this interval not displayed. No results found for: CHOL, HDL, LDLCALC, TRIG BNP (last 3 results)  Recent Labs  08/12/16 1637 09/26/16 1636  BNP 2,520.0* 3,415.8*    ProBNP (last 3 results) No results for input(s): PROBNP in the last 8760 hours.   Diagnostic Studies/Procedures   No results found.  Discharge Medications   Allergies as of 10/02/2016  Reactions   Codeine Other (See Comments)   agitation      Medication List    STOP taking these medications   losartan 25 MG tablet Commonly known as:  COZAAR     TAKE these medications   ALPRAZolam 0.25 MG tablet Commonly known as:  XANAX Take 0.25 mg by mouth at bedtime as needed for anxiety.   amiodarone 200 MG tablet Commonly known as:  PACERONE Take 200 mg by mouth daily.   aspirin 81 MG chewable tablet Chew 81 mg by mouth daily.   digoxin 0.125 MG tablet Commonly known as:  LANOXIN Take 1 tablet (0.125 mg total) by mouth daily. Start taking on:  10/03/2016   furosemide 40 MG tablet Commonly known as:  LASIX Take 2 tablets (80 mg total) by mouth daily. What changed:  how much to take   hydrALAZINE 25 MG tablet Commonly known as:  APRESOLINE Take 1 tablet (25 mg total) by mouth every 8 (eight) hours.   isosorbide mononitrate  30 MG 24 hr tablet Commonly known as:  IMDUR Take 1 tablet (30 mg total) by mouth daily. Start taking on:  10/03/2016   spironolactone 25 MG tablet Commonly known as:  ALDACTONE Take 0.5 tablets (12.5 mg total) by mouth daily. What changed:  how much to take       Disposition   The patient will be discharged in stable condition to home. Discharge Instructions    (HEART FAILURE PATIENTS) Call MD:  Anytime you have any of the following symptoms: 1) 3 pound weight gain in 24 hours or 5 pounds in 1 week 2) shortness of breath, with or without a dry hacking cough 3) swelling in the hands, feet or stomach 4) if you have to sleep on extra pillows at night in order to breathe.    Complete by:  As directed    Diet - low sodium heart healthy    Complete by:  As directed    Increase activity slowly    Complete by:  As directed      Follow-up Information    [facility] Follow up on 10/12/2016.   Specialty:  Cardiology Why:  at 11am for post hospital follow up. Please bring all of your medications to your visit. The code for parking is 6001. Enter through [employer] [facility] [place], Undergound parking on your right. Can also park in lower ED lot and enter blue awning.  Contact information: [address] [id number] [place] [phone]            Duration of Discharge Encounter: Greater than 35 minutes   Jeanmarie Hubert  NP-C 10/02/2016, 9:06 AM   Patient seen and examined with Darrick Grinder, NP. We discussed all aspects of the encounter. I agree with the assessment and plan as stated above.   He has been successfully weaned off milrinone. Will need to follow closely in HF Clinic. Continue amio fo PAF. No AC due to non compliance.   Glori Bickers, MD  12:26 AM

## 2016-10-02 NOTE — Progress Notes (Addendum)
Heart Failure Navigator Consult Note  Presentation: per Dr John Hicks is a 66 year old with a history of HTN, biventricular heart failure, PAF, and aortic root aneurysm admitted from HF clinic with A/C biventricular heart failure.   Admitted March 26-August 23, 2016 with marked volume overload and A/C biventricular heart failure. Hospital course complicated by cardiogenic shock, PAF, and C diff. He required short term milrinone. Diuresed down to 208 pounds. Prior to d/c he met with Palliative Care and requested to transition to Hospice. He went to post hospital visit with [facility] doctor and at that time he was doing ok so carvedilol and spironolactone added. He never transitioned to Hospice.    He has cancelled the 2 HF follow up appointments.   Today he presented to the HF clinic with increased dyspnea at rest and exeriton. Complaining of orthopnea. Increased leg edema. Poor appetite. Intermittent nausea. Says he has been taking all medications but noticed increased swelling. Continues to smoke 1/2 ppd. He was unable to drive to the his appointment today due to fatigue and dyspnea.    Past Medical History:  Diagnosis Date  . AF (atrial fibrillation) ([place])   . CHF (congestive heart failure) ([place])   . Dyspnea   . Enlarged heart   . Hypertension     Social History   Social History  . Marital status: Single    Spouse name: N/A  . Number of children: N/A  . Years of education: N/A   Social History Main Topics  . Smoking status: Current Every Day Smoker    Packs/day: 0.50    Types: Cigarettes  . Smokeless tobacco: Never Used  . Alcohol use No  . Drug use: Yes    Types: Marijuana  . Sexual activity: No   Other Topics Concern  . None   Social History Narrative  . None    ECHO:Study Conclusions-08/13/16  - Left ventricle: The cavity size was severely dilated. Wall   thickness was increased in a pattern of mild LVH. The estimated   ejection fraction was in the range  of 10% to 15%. Diffuse   hypokinesis. Abnormal diastolic dysfunction, indeterminant grade.   Doppler parameters are consistent with high ventricular filling   pressure. - Aortic valve: There was mild to moderate regurgitation. Valve   area (VTI): 3.75 cm^2. Valve area (Vmax): 3.71 cm^2. Valve area   (Vmean): 3.48 cm^2. - Aorta: The visualized portion of the proximal ascending aorta is   mildly dilated at 4.2 cm. Aortic root dimension: 48 mm (ED). - Aortic root: The aortic root was moderately to severely dilated. - Mitral valve: There was mild regurgitation. - Left atrium: The atrium was severely dilated. - Right ventricle: The cavity size was moderately dilated. Systolic   function was mildly reduced. - Right atrium: The atrium was severely dilated.  ------------------------------------------------------------------- Study data:  No prior study was available for comparison.  Study status:  Routine.  Procedure:  Transthoracic echocardiography. Image quality was adequate.  Study completion:  There were no complications.          Transthoracic echocardiography.  M-mode, complete 2D, spectral Doppler, and color Doppler.  Birthdate: [name]: September 17, 1950.  Age:  Patient is 66 yr old.  Sex: Gender: male.    BMI: 32.1 kg/m^2.  Blood pressure:     129/100 Patient status:  Inpatient.  Study date:  Study date: 08/13/2016. Study time: 11:04 AM.  Location:  Bedside.  BNP    Component Value Date/Time  BNP 3,415.8 (H) 09/26/2016 1636    ProBNP No results found for: PROBNP   Education Assessment and Provision:  Detailed education and instructions provided on heart failure disease management including the following:  Signs and symptoms of Heart Failure When to call the physician Importance of daily weights Low sodium diet Fluid restriction Medication management Anticipated future follow-up appointments  Patient education given on each of the above topics.  Patient  acknowledges understanding and acceptance of all instructions.  I spoke with John Hicks regarding his HF.  He currently does not have a scale and unfortunately I do not have any available. I have sent message to the HF Clinic to provide one when he attends F/U appt. He tells me that he is not adding table salt to foods and is eating low sodium.  I reviewed a low sodium diet and reinforced high sodium foods to avoid.  He has indicated that he could not afford medications and would not be able to get them until he could get them through the [facility]  I have provided HF medications through the [facility].  He plans to follow -up in the AHF Clinic.  He currently lives with a roommate in [place].  I will send a referral for Pharm D and SW to see in AHF Clinic for additional support.  Education Materials:  "Living Better With Heart Failure" Booklet, Daily Weight Tracker Tool    High Risk Criteria for Readmission and/or Poor Patient Outcomes:  (Recommend Follow-up with Advanced Heart Failure Clinic)   EF <30%- yes 10-15%  2 or more admissions in 6 months- Yes  Difficult social situation- yes-no insurance noted  Demonstrates medication noncompliance- yes    Barriers of Care:  Health literacy, Knowledge and compliance  Discharge Planning:  Plans to return to home with a 'roommate".  He would benefit from Paramedicine referral however he is not in [facility].

## 2016-10-11 ENCOUNTER — Telehealth (HOSPITAL_COMMUNITY): Payer: Self-pay | Admitting: Surgery

## 2016-10-11 NOTE — Telephone Encounter (Signed)
I called to check on John Hicks since his recent hospitalization and to remind him of his AHF Clinic appt on 10/12/16.  I left a message to that effect and encouraged him to call me back with any concerns or questions related to his HF.

## 2016-10-12 ENCOUNTER — Inpatient Hospital Stay (HOSPITAL_COMMUNITY): Admit: 2016-10-12 | Payer: No Typology Code available for payment source

## 2018-11-08 ENCOUNTER — Institutional Professional Consult (permissible substitution): Payer: No Typology Code available for payment source | Admitting: Pulmonary Disease

## 2019-01-01 ENCOUNTER — Other Ambulatory Visit: Payer: Self-pay | Admitting: Family

## 2019-01-06 ENCOUNTER — Other Ambulatory Visit: Payer: Self-pay | Admitting: Family

## 2019-01-06 DIAGNOSIS — Z0189 Encounter for other specified special examinations: Secondary | ICD-10-CM

## 2019-01-14 ENCOUNTER — Other Ambulatory Visit: Payer: Self-pay

## 2019-01-14 ENCOUNTER — Emergency Department (HOSPITAL_COMMUNITY): Payer: No Typology Code available for payment source

## 2019-01-14 ENCOUNTER — Emergency Department (HOSPITAL_COMMUNITY)
Admission: EM | Admit: 2019-01-14 | Discharge: 2019-01-14 | Disposition: A | Discharge status: Home / Self Care | Payer: No Typology Code available for payment source | Attending: Emergency Medicine | Admitting: Emergency Medicine

## 2019-01-14 ENCOUNTER — Ambulatory Visit (HOSPITAL_COMMUNITY)
Admission: RE | Admit: 2019-01-14 | Discharge: 2019-01-14 | Disposition: A | Discharge status: Home / Self Care | Payer: No Typology Code available for payment source | Source: Ambulatory Visit | Attending: Family | Admitting: Family

## 2019-01-14 ENCOUNTER — Encounter (HOSPITAL_COMMUNITY): Payer: Self-pay | Admitting: Emergency Medicine

## 2019-01-14 DIAGNOSIS — I4892 Unspecified atrial flutter: Secondary | ICD-10-CM | POA: Insufficient documentation

## 2019-01-14 DIAGNOSIS — I110 Hypertensive heart disease with heart failure: Secondary | ICD-10-CM | POA: Diagnosis not present

## 2019-01-14 DIAGNOSIS — Z0189 Encounter for other specified special examinations: Secondary | ICD-10-CM

## 2019-01-14 DIAGNOSIS — I480 Paroxysmal atrial fibrillation: Secondary | ICD-10-CM | POA: Insufficient documentation

## 2019-01-14 DIAGNOSIS — Z79899 Other long term (current) drug therapy: Secondary | ICD-10-CM | POA: Diagnosis not present

## 2019-01-14 DIAGNOSIS — I714 Abdominal aortic aneurysm, without rupture, unspecified: Secondary | ICD-10-CM

## 2019-01-14 DIAGNOSIS — F17210 Nicotine dependence, cigarettes, uncomplicated: Secondary | ICD-10-CM | POA: Insufficient documentation

## 2019-01-14 DIAGNOSIS — R1084 Generalized abdominal pain: Secondary | ICD-10-CM | POA: Diagnosis present

## 2019-01-14 DIAGNOSIS — I5022 Chronic systolic (congestive) heart failure: Secondary | ICD-10-CM | POA: Diagnosis not present

## 2019-01-14 LAB — COMPREHENSIVE METABOLIC PANEL
ALT: 10 U/L (ref 0–44)
AST: 15 U/L (ref 15–41)
Albumin: 4.1 g/dL (ref 3.5–5.0)
Alkaline Phosphatase: 50 U/L (ref 38–126)
Anion gap: 11 (ref 5–15)
BUN: 47 mg/dL — ABNORMAL HIGH (ref 8–23)
CO2: 27 mmol/L (ref 22–32)
Calcium: 9.7 mg/dL (ref 8.9–10.3)
Chloride: 94 mmol/L — ABNORMAL LOW (ref 98–111)
Creatinine, Ser: 2.39 mg/dL — ABNORMAL HIGH (ref 0.61–1.24)
GFR calc Af Amer: 32 mL/min — ABNORMAL LOW (ref >60)
GFR calc non Af Amer: 27 mL/min — ABNORMAL LOW (ref >60)
Glucose, Bld: 102 mg/dL — ABNORMAL HIGH (ref 70–99)
Potassium: 4.1 mmol/L (ref 3.5–5.1)
Sodium: 132 mmol/L — ABNORMAL LOW (ref 135–145)
Total Bilirubin: 0.7 mg/dL (ref 0.3–1.2)
Total Protein: 8.2 g/dL — ABNORMAL HIGH (ref 6.5–8.1)

## 2019-01-14 LAB — CBC WITH DIFFERENTIAL/PLATELET
Abs Immature Granulocytes: 0.03 10*3/uL (ref 0.00–0.07)
Basophils Absolute: 0 10*3/uL (ref 0.0–0.1)
Basophils Relative: 0 %
Eosinophils Absolute: 0.2 10*3/uL (ref 0.0–0.5)
Eosinophils Relative: 3 %
HCT: 34.9 % — ABNORMAL LOW (ref 39.0–52.0)
Hemoglobin: 11.2 g/dL — ABNORMAL LOW (ref 13.0–17.0)
Immature Granulocytes: 0 %
Lymphocytes Relative: 15 %
Lymphs Abs: 1.2 10*3/uL (ref 0.7–4.0)
MCH: 29 pg (ref 26.0–34.0)
MCHC: 32.1 g/dL (ref 30.0–36.0)
MCV: 90.4 fL (ref 80.0–100.0)
Monocytes Absolute: 0.7 10*3/uL (ref 0.1–1.0)
Monocytes Relative: 9 %
Neutro Abs: 5.7 10*3/uL (ref 1.7–7.7)
Neutrophils Relative %: 73 %
Platelets: 219 10*3/uL (ref 150–400)
RBC: 3.86 MIL/uL — ABNORMAL LOW (ref 4.22–5.81)
RDW: 14.4 % (ref 11.5–15.5)
WBC: 7.9 10*3/uL (ref 4.0–10.5)
nRBC: 0 % (ref 0.0–0.2)

## 2019-01-14 LAB — TYPE AND SCREEN
ABO/RH(D): O POS
Antibody Screen: NEGATIVE

## 2019-01-14 MED ORDER — FUROSEMIDE 40 MG PO TABS
80.0000 mg | ORAL_TABLET | Freq: Once | ORAL | Status: AC
  Administered 2019-01-14: 14:00:00 80 mg via ORAL
  Filled 2019-01-14: qty 2

## 2019-01-14 MED ORDER — DIGOXIN 125 MCG PO TABS
0.1250 mg | ORAL_TABLET | Freq: Once | ORAL | Status: AC
  Administered 2019-01-14: 13:00:00 0.125 mg via ORAL
  Filled 2019-01-14: qty 1

## 2019-01-14 MED ORDER — AMIODARONE HCL 200 MG PO TABS
200.0000 mg | ORAL_TABLET | Freq: Every day | ORAL | Status: DC
  Administered 2019-01-14: 200 mg via ORAL
  Filled 2019-01-14: qty 1

## 2019-01-14 MED ORDER — SPIRONOLACTONE 25 MG PO TABS
25.0000 mg | ORAL_TABLET | Freq: Every day | ORAL | Status: DC
  Administered 2019-01-14: 13:00:00 25 mg via ORAL
  Filled 2019-01-14 (×4): qty 1

## 2019-01-14 MED ORDER — HYDRALAZINE HCL 25 MG PO TABS
50.0000 mg | ORAL_TABLET | Freq: Once | ORAL | Status: AC
  Administered 2019-01-14: 13:00:00 50 mg via ORAL
  Filled 2019-01-14: qty 2

## 2019-01-14 MED ORDER — ISOSORBIDE MONONITRATE ER 30 MG PO TB24
30.0000 mg | ORAL_TABLET | Freq: Every day | ORAL | Status: DC
  Administered 2019-01-14: 13:00:00 30 mg via ORAL
  Filled 2019-01-14 (×4): qty 1

## 2019-01-14 NOTE — ED Provider Notes (Signed)
[facility] EMERGENCY DEPARTMENT Provider Note   CSN: [id number] Arrival date & time: 01/14/19  0945     History   Chief Complaint Chief Complaint  Patient presents with   Abdominal Pain    HPI John Hicks is a 68 y.o. male.     Patient sent over from radiology because he has a large abdominal aneurysm.  Patient has a history of congestive heart failure but has not been having abdominal symptoms.  His primary care doctor sent him over for screening ultrasound  The history is provided by the patient. No language interpreter was used.  Abdominal Pain Pain location:  Generalized Pain quality comment:  No pain now Pain radiates to:  Does not radiate Pain severity:  No pain Onset quality:  Unable to specify Timing: No pain. Progression:  Resolved Chronicity:  New Context: not alcohol use   Relieved by:  Nothing Worsened by:  Nothing Associated symptoms: no chest pain, no cough, no diarrhea, no fatigue and no hematuria     Past Medical History:  Diagnosis Date   AF (atrial fibrillation) (HCC)    CHF (congestive heart failure) ([place])    Dyspnea    Enlarged heart    Hypertension     Patient Active Problem List   Diagnosis Date Noted   Acute on chronic systolic heart failure ([place]) 09/26/2016   Acute on chronic systolic CHF (congestive heart failure), NYHA class 4 ([place]) 09/26/2016   Goals of care, counseling/discussion    Palliative care by specialist    NSVT (nonsustained ventricular tachycardia) (HCC)    PAF (paroxysmal atrial fibrillation) (HCC)    Cardiogenic shock (HCC)    AKI (acute kidney injury) ([place])    Typical atrial flutter ([place])    Acute exacerbation of congestive heart failure ([place]) 08/12/2016   Tobacco use disorder 08/12/2016   Essential hypertension 08/12/2016   Urinary tract infection 08/12/2016    Past Surgical History:  Procedure Laterality Date   APPENDECTOMY     KNEE SURGERY          Home Medications    Prior  to Admission medications   Medication Sig Start Date End Date Taking? Authorizing Provider  ALPRAZolam Duanne Moron) 0.25 MG tablet Take 0.25 mg by mouth at bedtime as needed for anxiety.    [provider]  amiodarone (PACERONE) 200 MG tablet Take 200 mg by mouth daily. 08/23/16   [provider]  aspirin 81 MG chewable tablet Chew 81 mg by mouth daily.    [provider]  chlorhexidine (PERIDEX) 0.12 % solution 15 mLs by Mouth Rinse route 2 (two) times daily at 8 am and 10 pm. 08/27/18   [provider]  digoxin (LANOXIN) 0.125 MG tablet Take 1 tablet (0.125 mg total) by mouth daily. 10/03/16   Clegg, Amy D, NP  furosemide (LASIX) 40 MG tablet Take 2 tablets (80 mg total) by mouth daily. 10/02/16   Clegg, Amy D, NP  hydrALAZINE (APRESOLINE) 25 MG tablet Take 1 tablet (25 mg total) by mouth every 8 (eight) hours. 10/02/16   Clegg, Amy D, NP  isosorbide mononitrate (IMDUR) 30 MG 24 hr tablet Take 1 tablet (30 mg total) by mouth daily. 10/03/16   Clegg, Amy D, NP  spironolactone (ALDACTONE) 25 MG tablet Take 0.5 tablets (12.5 mg total) by mouth daily. 10/02/16   Conrad Eckhart [place], NP    Family History Family History  Problem Relation Age of Onset   Stroke Father    Multiple sclerosis  Sister     Social History Social History   Tobacco Use   Smoking status: Current Every Day Smoker    Packs/day: 0.50    Types: Cigarettes   Smokeless tobacco: Never Used  Substance Use Topics   Alcohol use: No   Drug use: Yes    Types: Marijuana     Allergies   Codeine   Review of Systems Review of Systems  Constitutional: Negative for appetite change and fatigue.  HENT: Negative for congestion, ear discharge and sinus pressure.   Eyes: Negative for discharge.  Respiratory: Negative for cough.   Cardiovascular: Negative for chest pain.  Gastrointestinal: Negative for abdominal pain and diarrhea.  Genitourinary: Negative for frequency and hematuria.  Musculoskeletal:  Negative for back pain.  Skin: Negative for rash.  Neurological: Negative for seizures and headaches.  Psychiatric/Behavioral: Negative for hallucinations.     Physical Exam Updated Vital Signs BP (!) 136/107    Pulse (!) 53    Temp 98.3 F (36.8 C) (Oral)    Resp 16    Ht 6' (1.829 m)    Wt 66.7 kg    SpO2 100%    BMI 19.94 kg/m   Physical Exam Vitals signs and nursing note reviewed.  Constitutional:      Appearance: He is well-developed.  HENT:     Head: Normocephalic.     Nose: Nose normal.  Eyes:     General: No scleral icterus.    Conjunctiva/sclera: Conjunctivae normal.  Neck:     Musculoskeletal: Neck supple.     Thyroid: No thyromegaly.  Cardiovascular:     Rate and Rhythm: Normal rate and regular rhythm.     Heart sounds: No murmur. No friction rub. No gallop.   Pulmonary:     Breath sounds: No stridor. No wheezing or rales.  Chest:     Chest wall: No tenderness.  Abdominal:     General: There is distension.     Tenderness: There is no abdominal tenderness. There is no rebound.     Comments: Pulsatile mass in mid abdomen  Musculoskeletal: Normal range of motion.  Lymphadenopathy:     Cervical: No cervical adenopathy.  Skin:    Findings: No erythema or rash.  Neurological:     Mental Status: He is alert and oriented to person, place, and time.     Motor: No abnormal muscle tone.     Coordination: Coordination normal.  Psychiatric:        Behavior: Behavior normal.      ED Treatments / Results  Labs (all labs ordered are listed, but only abnormal results are displayed) Labs Reviewed  CBC WITH DIFFERENTIAL/PLATELET - Abnormal; Notable for the following components:      Result Value   RBC 3.86 (*)    Hemoglobin 11.2 (*)    HCT 34.9 (*)    All other components within normal limits  COMPREHENSIVE METABOLIC PANEL - Abnormal; Notable for the following components:   Sodium 132 (*)    Chloride 94 (*)    Glucose, Bld 102 (*)    BUN 47 (*)     Creatinine, Ser 2.39 (*)    Total Protein 8.2 (*)    GFR calc non Af Amer 27 (*)    GFR calc Af Amer 32 (*)    All other components within normal limits  TYPE AND SCREEN    EKG EKG Interpretation  Date/Time:  Tuesday January 14 2019 10:24:21 EDT Ventricular Rate:  49 PR Interval:  QRS Duration: 198 QT Interval:  463 QTC Calculation: 418 R Axis:   -14 Text Interpretation:  Sinus or ectopic atrial bradycardia Prolonged PR interval Right bundle branch block Confirmed by Milton Ferguson [phone]) on 01/14/2019 1:37:16 PM   Radiology Ct Abdomen Pelvis Wo Contrast  Result Date: 01/14/2019 CLINICAL DATA:  Abdominal aortic aneurysm EXAM: CT ABDOMEN AND PELVIS WITHOUT CONTRAST TECHNIQUE: Multidetector CT imaging of the abdomen and pelvis was performed following the standard protocol without oral or IV contrast. COMPARISON:  Ultrasound of abdominal aorta January 14, 2019 FINDINGS: Lower chest: There is scarring in the right lung base. There is no lung base edema or consolidation. There are foci of coronary artery calcification. Hepatobiliary: There is a calcified granuloma in the anterior segment right lobe of the liver. No other focal liver lesions are identified on this noncontrast enhanced study. There appears to be sludge in the gallbladder. Gallbladder wall does not appear thickened. There is no biliary duct dilatation. Pancreas: No pancreatic mass or inflammatory focus evident. Spleen: No splenic lesions evident. Adrenals/Urinary Tract: Adrenals bilaterally appear normal. There is a cyst arising from the anterior aspect of the lower pole right kidney measuring 2.3 x 2.3 cm. A cyst arising from the lateral mid right kidney measures 1.5 x 1.3 cm. There is a cyst arising from the upper pole of the left kidney measuring 3.9 x 3.1 cm. There is a mass arising from the anterior mid left kidney measuring 1.4 x 1.2 cm which has attenuation values higher than is expected with a cyst. There is no evident high  hydronephrosis on either side. There is no renal or ureteral calculus on either side. Urinary bladder is midline. Urinary bladder wall is mildly thickened. Stomach/Bowel: There is moderate stool in the colon. There is no bowel wall or mesenteric thickening. There is no evident bowel obstruction. There is no free air or portal venous air. The terminal ileum appears unremarkable. Vascular/Lymphatic: There is extensive aortoiliac atherosclerosis. There is an abdominal aortic aneurysm which arises slightly superior to the renal arteries and extends to the bifurcation. This aneurysm measures 14.0 cm from superior to inferior dimension. This aneurysm has a maximum transverse diameter of 9.7 x 9.1 cm. There is no obvious leakage of fluid from the aneurysm appreciable on this noncontrast enhanced study. There is moderate thrombus within this large aneurysm. The common iliac arteries are tortuous without aneurysmal dilatation. There is calcification throughout the external iliac, internal iliac, and common femoral arteries without aneurysm in these vessels. Calcification is also noted in the proximal superficial femoral and profunda femoral arteries without aneurysm. No adenopathy is evident in the abdomen or pelvis. Reproductive: The prostate is diffusely enlarged. The prostate cannot be separated from the inferior bladder. There are multiple prostatic calculi. Seminal vesicles appear unremarkable. Other: No abscess or ascites is evident in the abdomen or pelvis. There is no periappendiceal region inflammation; appendix absent. Musculoskeletal: There is degenerative change throughout the lumbar spine. No blastic or lytic bone lesions evident. No intramuscular lesions. IMPRESSION: 1. Abdominal aortic aneurysm which arises slightly superior to the renal arteries and extends bifurcation, spanning a superior to inferior distance of 14.0 cm. The maximum transverse diameter of this aneurysm is measured at 9.7 x 9.1 cm. Moderate  thrombus is seen within this aneurysm on noncontrast enhanced study. No obvious leakage seen on noncontrast enhanced study. Surgical consultation advised given aneurysm of this size. 2. Enlarged prostate with multiple prostatic calculi. Prostate cannot be separated from inferior bladder. This is a finding  that warrants clinical assessment as well as PSA. The possibility of prostatic neoplasm must be of concern given the size and configuration of the prostate. 3. There is a mass arising from the anterior mid left kidney measuring 1.4 x 1.2 cm which has attenuation values higher than is expected with a cyst. A small renal cell neoplasm must be of concern. Further evaluation with pre and post contrast MRI should be considered. Pre and post contrast CT could alternatively be performed, but would likely be of decreased accuracy given lesion size. 4. Extensive arterial vascular calcification throughout the abdomen and pelvis. Multiple foci of coronary artery calcification also noted. 5. No hydronephrosis. No renal or ureteral calculus on either side. 6. No bowel obstruction. No abscess in the abdomen or pelvis. Appendix absent. 7.  Suspect a degree of sludge in gallbladder. Electronically Signed   By: Lowella Grip III M.D.   On: 01/14/2019 13:16   US Aorta Medicare Screening  Result Date: 01/14/2019 CLINICAL DATA:  Screening for abdominal aortic aneurysm, history atrial fibrillation, CHF, hypertension, smoker EXAM: US ABDOMINAL AORTA MEDICARE SCREENING TECHNIQUE: Ultrasound examination of the abdominal aorta was performed as a screening evaluation for abdominal aortic aneurysm. COMPARISON:  None FINDINGS: Abdominal aortic measurements as follows: Proximal:  4.6 x 4.2 cm Mid:  8.1 x 7.8cm Distal:  9.3 x 10.7 cm Extensive eccentric irregular thrombus within very large aortic aneurysm. Swirling rouleaux flow seen within the large mid to distal aneurysmal segment. IMPRESSION: Large abdominal aortic aneurysm measuring  up to 9.3 x 10.7 cm. CTA assessment recommended. Patient currently asymptomatic. Findings discussed with patient. Based on size and risk, patient was taken to the emergency room for evaluation and further workup. Electronically Signed   By: Lavonia Dana M.D.   On: 01/14/2019 09:44    Procedures Procedures (including critical care time)  Medications Ordered in ED Medications  isosorbide mononitrate (IMDUR) 24 hr tablet 30 mg (30 mg Oral Given 01/14/19 1325)  spironolactone (ALDACTONE) tablet 25 mg (25 mg Oral Given 01/14/19 1325)  amiodarone (PACERONE) tablet 200 mg (200 mg Oral Given 01/14/19 1324)  digoxin (LANOXIN) tablet 0.125 mg (0.125 mg Oral Given 01/14/19 1326)  hydrALAZINE (APRESOLINE) tablet 50 mg (50 mg Oral Given 01/14/19 1324)  furosemide (LASIX) tablet 80 mg (80 mg Oral Given 01/14/19 1330)     Initial Impression / Assessment and Plan / ED Course  I have reviewed the triage vital signs and the nursing notes.  Pertinent labs & imaging results that were available during my care of the patient were reviewed by me and considered in my medical decision making (see chart for details).        Patient with large abdominal aneurysm seen on ultrasound and CT.  I spoke to the vascular surgeon Dr. Doren Custard and he stated that the patient needs to be seen in the office this week.  They will contact him and make an appointment.  I instructed the patient if he does not hear from Dr. Mee Hives office in 2 days that he should call them himself and get seen this week  Final Clinical Impressions(s) / ED Diagnoses   Final diagnoses:  AAA (abdominal aortic aneurysm) without rupture [facility])    ED Discharge Orders    None       Milton Ferguson, MD 01/14/19 1350

## 2019-01-14 NOTE — Discharge Instructions (Addendum)
Follow-up with Dr. Scot Dock or 1 of his partners this week.  If you do not hear from them by tomorrow you need to give them a call tomorrow afternoon to set up the appointment.  Their office is on [address] which is not near [facility].

## 2019-01-14 NOTE — ED Triage Notes (Signed)
Pt was sent from radiology for an ultrasound. Abnormal results

## 2019-01-15 ENCOUNTER — Encounter: Payer: No Typology Code available for payment source | Admitting: Vascular Surgery

## 2019-01-15 ENCOUNTER — Telehealth: Payer: Self-pay | Admitting: *Deleted

## 2019-01-15 NOTE — Telephone Encounter (Signed)
I spoke with the nurse at [facility] [phone]) and they are sending an authorization over to the [facility] to request that we seen this patient emergently for his 9.7cm AAA. Dr. Scot Dock has him scheduled for this afternoon, pt was seen at [facility] yesterday and had CT. I have faxed those records to the [facility] office.

## 2019-01-15 NOTE — Telephone Encounter (Signed)
Patient was unable to come to the office today so we rescheduled him for tomorrow.

## 2019-01-16 ENCOUNTER — Encounter: Payer: No Typology Code available for payment source | Admitting: Vascular Surgery

## 2019-01-16 ENCOUNTER — Telehealth: Payer: Self-pay

## 2019-01-16 NOTE — Telephone Encounter (Signed)
Pt called and said that he was not able to get to the appt today due to transportation. First he can get here is 9/2. Risk with putting off appt have been discussed. Appt rescheduled per pt request. If any symptoms advised to go to ER.   York Cerise, CMA

## 2019-01-21 ENCOUNTER — Telehealth (HOSPITAL_COMMUNITY): Payer: Self-pay | Admitting: Rehabilitation

## 2019-01-21 NOTE — Telephone Encounter (Signed)

## 2019-01-22 ENCOUNTER — Ambulatory Visit (INDEPENDENT_AMBULATORY_CARE_PROVIDER_SITE_OTHER): Payer: No Typology Code available for payment source | Admitting: Vascular Surgery

## 2019-01-22 ENCOUNTER — Other Ambulatory Visit: Payer: Self-pay | Admitting: *Deleted

## 2019-01-22 ENCOUNTER — Encounter: Payer: Self-pay | Admitting: Vascular Surgery

## 2019-01-22 ENCOUNTER — Other Ambulatory Visit: Payer: Self-pay

## 2019-01-22 VITALS — BP 106/77 | HR 79 | Temp 97.3°F | Resp 20 | Ht 72.0 in | Wt 144.4 lb

## 2019-01-22 DIAGNOSIS — I714 Abdominal aortic aneurysm, without rupture, unspecified: Secondary | ICD-10-CM

## 2019-01-22 NOTE — Progress Notes (Signed)
REASON FOR CONSULT:    Abdominal aortic aneurysm.  ASSESSMENT & PLAN:   ABDOMINAL AORTIC ANEURYSM: This patient has a 9 and half centimeter infrarenal abdominal aortic aneurysm  I have explained that given the size of the aneurysm he is at high risk for rupture and that we should fix this as soon as possible.  Based on his CT scan which shows an inadequate neck, severe tortuosity of the iliac arteries, and given his renal insufficiency I do not think he is a candidate for endovascular repair.  He would require open repair.  This is complicated by his chronic kidney disease and significant calcific disease of his aorta and iliac arteries.  He clearly needs preoperative cardiac clearance given his cardiac history and once he is cleared from a cardiac standpoint we will proceed ASAP.    We have discussed the indications for aneurysm repair.  Clearly without repair he is at very high risk for rupture.  I have discussed the potential complications of surgery including but not limited to MI, renal failure, bleeding, need for prolonged ventilatory support, and infection.  I think his risk of mortality or major morbidity is 5 to 10%.  However without repair I think certainly this aneurysm would likely rupture within the next 1 to 2 years.  We will proceed urgently once he is cleared from a cardiac standpoint.  In addition the patient has significant protein calorie malnutrition however given the size of the aneurysm I think this will have to be addressed postoperatively.  He will need dietary involvement postoperatively.  We have discussed the importance of tobacco cessation.  Deitra Mayo, MD, FACS Beeper [phone] Office: [phone]   HPI:   John Hicks is a pleasant 68 y.o. male, who was seen in the [facility] emergency department after he was referred to get an ultrasound for an aneurysm.  This showed a 9.7 cm infrarenal abdominal aortic aneurysm by CT scan.  We have been trying to get  him into the office for the last 2 weeks.  He comes in today for evaluation.  Patient apparently was found to have an abdominal aortic aneurysm on exam and was sent to the emergency department.  He was not aware that he had an aneurysm.  He does have some chronic back pain but no abdominal pain.  He is unaware of any family history of aneurysmal disease.  He does have a history of significant congestive heart failure but is not been seen by cardiology since 2018.  The denies any history of diabetes or hypercholesterolemia.  He does have a history of hypertension.  He currently smokes a pack per day of cigarettes.  Past Medical History:  Diagnosis Date  . AAA (abdominal aortic aneurysm) ([place])   . AF (atrial fibrillation) ([place])   . CHF (congestive heart failure) ([place])   . Dyspnea   . Enlarged heart   . Hypertension     Family History  Problem Relation Age of Onset  . Stroke Father   . Multiple sclerosis Sister     SOCIAL HISTORY: Social History   Socioeconomic History  . Marital status: Single    Spouse name: Not on file  . Number of children: Not on file  . Years of education: Not on file  . Highest education level: Not on file  Occupational History  . Not on file  Social Needs  . Financial resource strain: Not on file  . Food insecurity    Worry: Not on file  Inability: Not on file  . Transportation needs    Medical: Not on file    Non-medical: Not on file  Tobacco Use  . Smoking status: Current Every Day Smoker    Packs/day: 1.00    Types: Cigarettes  . Smokeless tobacco: Never Used  Substance and Sexual Activity  . Alcohol use: No  . Drug use: Yes    Types: Marijuana  . Sexual activity: Never  Lifestyle  . Physical activity    Days per week: Not on file    Minutes per session: Not on file  . Stress: Not on file  Relationships  . Social Herbalist on phone: Not on file    Gets together: Not on file    Attends religious service: Not on file     Active member of club or organization: Not on file    Attends meetings of clubs or organizations: Not on file    Relationship status: Not on file  . Intimate partner violence    Fear of current or ex partner: Not on file    Emotionally abused: Not on file    Physically abused: Not on file    Forced sexual activity: Not on file  Other Topics Concern  . Not on file  Social History Narrative  . Not on file    Allergies  Allergen Reactions  . Codeine Other (See Comments)    agitation    Current Outpatient Medications  Medication Sig Dispense Refill  . ALPRAZolam (XANAX) 0.25 MG tablet Take 0.25 mg by mouth at bedtime as needed for anxiety.    Marland Kitchen amiodarone (PACERONE) 200 MG tablet Take 200 mg by mouth daily.  0  . aspirin 81 MG chewable tablet Chew 81 mg by mouth daily.    . chlorhexidine (PERIDEX) 0.12 % solution 15 mLs by Mouth Rinse route 2 (two) times daily at 8 am and 10 pm.    . digoxin (LANOXIN) 0.125 MG tablet Take 1 tablet (0.125 mg total) by mouth daily. 30 tablet 6  . furosemide (LASIX) 40 MG tablet Take 2 tablets (80 mg total) by mouth daily. 30 tablet 0  . hydrALAZINE (APRESOLINE) 25 MG tablet Take 1 tablet (25 mg total) by mouth every 8 (eight) hours. 90 tablet 6  . isosorbide mononitrate (IMDUR) 30 MG 24 hr tablet Take 1 tablet (30 mg total) by mouth daily. 30 tablet 6  . spironolactone (ALDACTONE) 25 MG tablet Take 0.5 tablets (12.5 mg total) by mouth daily. 30 tablet 6   No current facility-administered medications for this visit.     REVIEW OF SYSTEMS:  [X]  denotes positive finding, [ ]  denotes negative finding Cardiac  Comments:  Chest pain or chest pressure:    Shortness of breath upon exertion: x   Short of breath when lying flat:    Irregular heart rhythm: x       Vascular    Pain in calf, thigh, or hip brought on by ambulation:    Pain in feet at night that wakes you up from your sleep:     Blood clot in your veins:    Leg swelling:         Pulmonary     Oxygen at home:    Productive cough:     Wheezing:         Neurologic    Sudden weakness in arms or legs:     Sudden numbness in arms or legs:     Sudden  onset of difficulty speaking or slurred speech:    Temporary loss of vision in one eye:     Problems with dizziness:         Gastrointestinal    Blood in stool:     Vomited blood:         Genitourinary    Burning when urinating:     Blood in urine:        Psychiatric    Major depression:         Hematologic    Bleeding problems:    Problems with blood clotting too easily:        Skin    Rashes or ulcers:        Constitutional    Fever or chills:     PHYSICAL EXAM:   Vitals:   01/22/19 0925  BP: 106/77  Pulse: 79  Resp: 20  Temp: (!) 97.3 F (36.3 C)  SpO2: 98%  Weight: 144 lb 6.4 oz (65.5 kg)  Height: 6' (1.829 m)    GENERAL: The patient is a well-nourished male, in no acute distress. The vital signs are documented above. CARDIAC: There is a regular rate and rhythm.  VASCULAR: I do not detect carotid bruits. He has palpable femoral popliteal and posterior tibial pulses bilaterally. He has no significant lower extremity swelling. PULMONARY: There is good air exchange bilaterally without wheezing or rales. ABDOMEN: Soft and non-tender with normal pitched bowel sounds.  He has a very large abdominal aortic aneurysm. MUSCULOSKELETAL: There are no major deformities or cyanosis. NEUROLOGIC: No focal weakness or paresthesias are detected. SKIN: There are no ulcers or rashes noted. PSYCHIATRIC: The patient has a normal affect.  DATA:    CT ABDOMEN PELVIS: I reviewed his CT of the abdomen and pelvis that was done in August.  He has a 9.5 cm infrarenal abdominal aortic aneurysm.  He does not appear to have an adequate neck for an endovascular repair.  In addition he has significant tortuosity of the iliac arteries which I think would prohibit an endovascular repair.  He does have significant calcific disease in  his iliac arteries.  LABS: I reviewed his labs from 01/14/2019.  GFR was 27.  Creatinine 2.39.  White blood cell count 7.9, hemoglobin 11.2, hematocrit 34.9, platelets 219,000.

## 2019-01-28 ENCOUNTER — Other Ambulatory Visit: Payer: Self-pay

## 2019-01-28 ENCOUNTER — Emergency Department (HOSPITAL_COMMUNITY): Payer: Medicare Other | Admitting: Anesthesiology

## 2019-01-28 ENCOUNTER — Telehealth: Payer: Self-pay | Admitting: *Deleted

## 2019-01-28 ENCOUNTER — Inpatient Hospital Stay (HOSPITAL_COMMUNITY)
Admission: EM | Admit: 2019-01-28 | Discharge: 2019-02-05 | DRG: 269 | Disposition: A | Discharge status: Home / Self Care | Payer: Medicare Other | Attending: Vascular Surgery | Admitting: Vascular Surgery

## 2019-01-28 ENCOUNTER — Encounter (HOSPITAL_COMMUNITY)
Admission: EM | Disposition: A | Discharge status: Home / Self Care | Payer: Self-pay | Source: Home / Self Care | Attending: Vascular Surgery

## 2019-01-28 ENCOUNTER — Encounter (HOSPITAL_COMMUNITY): Payer: Self-pay | Admitting: Anesthesiology

## 2019-01-28 ENCOUNTER — Emergency Department (HOSPITAL_COMMUNITY): Payer: Medicare Other

## 2019-01-28 DIAGNOSIS — Z20828 Contact with and (suspected) exposure to other viral communicable diseases: Secondary | ICD-10-CM | POA: Diagnosis present

## 2019-01-28 DIAGNOSIS — I70203 Unspecified atherosclerosis of native arteries of extremities, bilateral legs: Secondary | ICD-10-CM | POA: Diagnosis present

## 2019-01-28 DIAGNOSIS — I5032 Chronic diastolic (congestive) heart failure: Secondary | ICD-10-CM | POA: Diagnosis not present

## 2019-01-28 DIAGNOSIS — I429 Cardiomyopathy, unspecified: Secondary | ICD-10-CM | POA: Diagnosis present

## 2019-01-28 DIAGNOSIS — I480 Paroxysmal atrial fibrillation: Secondary | ICD-10-CM | POA: Diagnosis present

## 2019-01-28 DIAGNOSIS — I739 Peripheral vascular disease, unspecified: Secondary | ICD-10-CM | POA: Diagnosis not present

## 2019-01-28 DIAGNOSIS — Z79899 Other long term (current) drug therapy: Secondary | ICD-10-CM | POA: Diagnosis not present

## 2019-01-28 DIAGNOSIS — R64 Cachexia: Secondary | ICD-10-CM | POA: Diagnosis present

## 2019-01-28 DIAGNOSIS — Z681 Body mass index (BMI) 19 or less, adult: Secondary | ICD-10-CM

## 2019-01-28 DIAGNOSIS — I130 Hypertensive heart and chronic kidney disease with heart failure and stage 1 through stage 4 chronic kidney disease, or unspecified chronic kidney disease: Secondary | ICD-10-CM | POA: Diagnosis present

## 2019-01-28 DIAGNOSIS — I959 Hypotension, unspecified: Secondary | ICD-10-CM | POA: Diagnosis not present

## 2019-01-28 DIAGNOSIS — N400 Enlarged prostate without lower urinary tract symptoms: Secondary | ICD-10-CM | POA: Diagnosis present

## 2019-01-28 DIAGNOSIS — I4891 Unspecified atrial fibrillation: Secondary | ICD-10-CM

## 2019-01-28 DIAGNOSIS — Z01810 Encounter for preprocedural cardiovascular examination: Secondary | ICD-10-CM | POA: Diagnosis not present

## 2019-01-28 DIAGNOSIS — Z885 Allergy status to narcotic agent status: Secondary | ICD-10-CM

## 2019-01-28 DIAGNOSIS — N184 Chronic kidney disease, stage 4 (severe): Secondary | ICD-10-CM | POA: Diagnosis present

## 2019-01-28 DIAGNOSIS — I7410 Embolism and thrombosis of unspecified parts of aorta: Secondary | ICD-10-CM | POA: Diagnosis present

## 2019-01-28 DIAGNOSIS — I714 Abdominal aortic aneurysm, without rupture, unspecified: Secondary | ICD-10-CM | POA: Diagnosis present

## 2019-01-28 DIAGNOSIS — I4510 Unspecified right bundle-branch block: Secondary | ICD-10-CM | POA: Diagnosis present

## 2019-01-28 DIAGNOSIS — Z7982 Long term (current) use of aspirin: Secondary | ICD-10-CM | POA: Diagnosis not present

## 2019-01-28 DIAGNOSIS — Z820 Family history of epilepsy and other diseases of the nervous system: Secondary | ICD-10-CM

## 2019-01-28 DIAGNOSIS — Z9889 Other specified postprocedural states: Secondary | ICD-10-CM

## 2019-01-28 DIAGNOSIS — R1030 Lower abdominal pain, unspecified: Secondary | ICD-10-CM

## 2019-01-28 DIAGNOSIS — I5042 Chronic combined systolic (congestive) and diastolic (congestive) heart failure: Secondary | ICD-10-CM | POA: Diagnosis present

## 2019-01-28 DIAGNOSIS — I712 Thoracic aortic aneurysm, without rupture: Secondary | ICD-10-CM | POA: Diagnosis present

## 2019-01-28 DIAGNOSIS — I509 Heart failure, unspecified: Secondary | ICD-10-CM

## 2019-01-28 DIAGNOSIS — I440 Atrioventricular block, first degree: Secondary | ICD-10-CM | POA: Diagnosis present

## 2019-01-28 DIAGNOSIS — Z823 Family history of stroke: Secondary | ICD-10-CM

## 2019-01-28 DIAGNOSIS — F17210 Nicotine dependence, cigarettes, uncomplicated: Secondary | ICD-10-CM | POA: Diagnosis present

## 2019-01-28 DIAGNOSIS — N183 Chronic kidney disease, stage 3 (moderate): Secondary | ICD-10-CM | POA: Diagnosis not present

## 2019-01-28 LAB — CBC
HCT: 35.1 % — ABNORMAL LOW (ref 39.0–52.0)
Hemoglobin: 11.4 g/dL — ABNORMAL LOW (ref 13.0–17.0)
MCH: 29.3 pg (ref 26.0–34.0)
MCHC: 32.5 g/dL (ref 30.0–36.0)
MCV: 90.2 fL (ref 80.0–100.0)
Platelets: 301 10*3/uL (ref 150–400)
RBC: 3.89 MIL/uL — ABNORMAL LOW (ref 4.22–5.81)
RDW: 13.9 % (ref 11.5–15.5)
WBC: 10.8 10*3/uL — ABNORMAL HIGH (ref 4.0–10.5)
nRBC: 0 % (ref 0.0–0.2)

## 2019-01-28 LAB — PROTIME-INR
INR: 1.1 (ref 0.8–1.2)
Prothrombin Time: 14.4 seconds (ref 11.4–15.2)

## 2019-01-28 LAB — COMPREHENSIVE METABOLIC PANEL
ALT: 18 U/L (ref 0–44)
AST: 20 U/L (ref 15–41)
Albumin: 3.8 g/dL (ref 3.5–5.0)
Alkaline Phosphatase: 51 U/L (ref 38–126)
Anion gap: 13 (ref 5–15)
BUN: 47 mg/dL — ABNORMAL HIGH (ref 8–23)
CO2: 25 mmol/L (ref 22–32)
Calcium: 9.6 mg/dL (ref 8.9–10.3)
Chloride: 92 mmol/L — ABNORMAL LOW (ref 98–111)
Creatinine, Ser: 2.43 mg/dL — ABNORMAL HIGH (ref 0.61–1.24)
GFR calc Af Amer: 31 mL/min — ABNORMAL LOW (ref >60)
GFR calc non Af Amer: 27 mL/min — ABNORMAL LOW (ref >60)
Glucose, Bld: 117 mg/dL — ABNORMAL HIGH (ref 70–99)
Potassium: 4 mmol/L (ref 3.5–5.1)
Sodium: 130 mmol/L — ABNORMAL LOW (ref 135–145)
Total Bilirubin: 0.5 mg/dL (ref 0.3–1.2)
Total Protein: 7.9 g/dL (ref 6.5–8.1)

## 2019-01-28 LAB — URINALYSIS, ROUTINE W REFLEX MICROSCOPIC
Bilirubin Urine: NEGATIVE
Glucose, UA: NEGATIVE mg/dL
Ketones, ur: NEGATIVE mg/dL
Nitrite: POSITIVE — AB
Protein, ur: NEGATIVE mg/dL
Specific Gravity, Urine: 1.005 (ref 1.005–1.030)
WBC, UA: >50 WBC/hpf — ABNORMAL HIGH (ref 0–5)
pH: 7 (ref 5.0–8.0)

## 2019-01-28 LAB — I-STAT CHEM 8, ED
BUN: 44 mg/dL — ABNORMAL HIGH (ref 8–23)
Calcium, Ion: 1.22 mmol/L (ref 1.15–1.40)
Chloride: 97 mmol/L — ABNORMAL LOW (ref 98–111)
Creatinine, Ser: 2.3 mg/dL — ABNORMAL HIGH (ref 0.61–1.24)
Glucose, Bld: 111 mg/dL — ABNORMAL HIGH (ref 70–99)
HCT: 38 % — ABNORMAL LOW (ref 39.0–52.0)
Hemoglobin: 12.9 g/dL — ABNORMAL LOW (ref 13.0–17.0)
Potassium: 4.2 mmol/L (ref 3.5–5.1)
Sodium: 133 mmol/L — ABNORMAL LOW (ref 135–145)
TCO2: 29 mmol/L (ref 22–32)

## 2019-01-28 LAB — APTT: aPTT: 32 seconds (ref 24–36)

## 2019-01-28 LAB — SARS CORONAVIRUS 2 BY RT PCR (HOSPITAL ORDER, PERFORMED IN ~~LOC~~ HOSPITAL LAB): SARS Coronavirus 2: NEGATIVE

## 2019-01-28 LAB — LIPASE, BLOOD: Lipase: 55 U/L — ABNORMAL HIGH (ref 11–51)

## 2019-01-28 LAB — ABO/RH: ABO/RH(D): O POS

## 2019-01-28 SURGERY — ANEURYSM ABDOMINAL AORTIC REPAIR
Anesthesia: General

## 2019-01-28 MED ORDER — 0.9 % SODIUM CHLORIDE (POUR BTL) OPTIME
TOPICAL | Status: DC | PRN
  Administered 2019-01-28: 22:00:00 3000 mL

## 2019-01-28 MED ORDER — CEFAZOLIN SODIUM-DEXTROSE 2-4 GM/100ML-% IV SOLN
INTRAVENOUS | Status: AC
  Filled 2019-01-28: qty 100

## 2019-01-28 MED ORDER — SODIUM CHLORIDE 0.9 % IV SOLN
INTRAVENOUS | Status: AC
  Filled 2019-01-28: qty 1.2

## 2019-01-28 MED ORDER — FENTANYL CITRATE (PF) 100 MCG/2ML IJ SOLN
50.0000 ug | Freq: Once | INTRAMUSCULAR | Status: DC
  Filled 2019-01-28: qty 2

## 2019-01-28 MED ORDER — SODIUM CHLORIDE 0.9 % IV SOLN
INTRAVENOUS | Status: AC
  Filled 2019-01-28: qty 30

## 2019-01-28 MED ORDER — SODIUM CHLORIDE 0.9 % IV SOLN
INTRAVENOUS | Status: DC | PRN
  Administered 2019-01-28: 22:00:00 500 mL

## 2019-01-28 MED ORDER — SODIUM CHLORIDE 0.9% FLUSH
3.0000 mL | Freq: Once | INTRAVENOUS | Status: AC
  Administered 2019-01-29: 3 mL via INTRAVENOUS

## 2019-01-28 MED ORDER — MIDAZOLAM HCL 2 MG/2ML IJ SOLN
INTRAMUSCULAR | Status: AC
  Filled 2019-01-28: qty 2

## 2019-01-28 MED ORDER — IOHEXOL 350 MG/ML SOLN
100.0000 mL | Freq: Once | INTRAVENOUS | Status: AC | PRN
  Administered 2019-01-28: 19:00:00 100 mL via INTRAVENOUS

## 2019-01-28 MED ORDER — PROPOFOL 10 MG/ML IV BOLUS
INTRAVENOUS | Status: AC
  Filled 2019-01-28: qty 20

## 2019-01-28 MED ORDER — FENTANYL CITRATE (PF) 250 MCG/5ML IJ SOLN
INTRAMUSCULAR | Status: AC
  Filled 2019-01-28: qty 10

## 2019-01-28 NOTE — ED Provider Notes (Signed)
[facility] EMERGENCY DEPARTMENT Provider Note   CSN: [id number] Arrival date & time: 01/28/19  1736     History   Chief Complaint Chief Complaint  Patient presents with   AAA    HPI John Hicks is a 68 y.o. male with a known history of 9.5 cm abdominal aortic aneurysm, with planned operative fixation with vascular surgery next week, presenting to the emergency department with complaint of abdominal pain.  The patient presents today to the emergency department reporting abdominal pain ongoing for at least 1 week.  He describes it as bilateral lower abdominal pain which radiates to his back.  It is an aching pain.  He has not had this kind of pain before.  He has been able to perform his daily activities.  He denies any lightheadedness or syncope.  He was referred into the emergency department by his vascular surgery team today.     HPI  Past Medical History:  Diagnosis Date   AAA (abdominal aortic aneurysm) (HCC)    AF (atrial fibrillation) (HCC)    CHF (congestive heart failure) (HCC)    Dyspnea    Enlarged heart    Hypertension     Patient Active Problem List   Diagnosis Date Noted   AAA (abdominal aortic aneurysm) without rupture ([place]) 01/29/2019   AAA (abdominal aortic aneurysm) ([place]) 01/28/2019   Acute on chronic systolic heart failure ([place]) 09/26/2016   Acute on chronic systolic CHF (congestive heart failure), NYHA class 4 ([place]) 09/26/2016   Goals of care, counseling/discussion    Palliative care by specialist    NSVT (nonsustained ventricular tachycardia) (HCC)    PAF (paroxysmal atrial fibrillation) (HCC)    Cardiogenic shock (HCC)    AKI (acute kidney injury) ([place])    Typical atrial flutter ([place])    Acute exacerbation of congestive heart failure ([place]) 08/12/2016   Tobacco use disorder 08/12/2016   Essential hypertension 08/12/2016   Urinary tract infection 08/12/2016    Past Surgical History:  Procedure Laterality  Date   APPENDECTOMY     KNEE SURGERY          Home Medications    Prior to Admission medications   Medication Sig Start Date End Date Taking? Authorizing Provider  ALPRAZolam Duanne Moron) 0.25 MG tablet Take 0.25 mg by mouth at bedtime as needed for anxiety.   Yes [provider]  amiodarone (PACERONE) 200 MG tablet Take 200 mg by mouth daily. 08/23/16  Yes [provider]  aspirin 81 MG chewable tablet Chew 81 mg by mouth daily.   Yes [provider]  digoxin (LANOXIN) 0.125 MG tablet Take 1 tablet (0.125 mg total) by mouth daily. 10/03/16  Yes Clegg, Amy D, NP  furosemide (LASIX) 40 MG tablet Take 2 tablets (80 mg total) by mouth daily. 10/02/16  Yes Clegg, Amy D, NP  hydrALAZINE (APRESOLINE) 25 MG tablet Take 1 tablet (25 mg total) by mouth every 8 (eight) hours. 10/02/16  Yes Clegg, Amy D, NP  isosorbide mononitrate (IMDUR) 30 MG 24 hr tablet Take 1 tablet (30 mg total) by mouth daily. 10/03/16  Yes Clegg, Amy D, NP  spironolactone (ALDACTONE) 25 MG tablet Take 0.5 tablets (12.5 mg total) by mouth daily. 10/02/16  Yes Clegg, Amy D, NP    Family History Family History  Problem Relation Age of Onset   Stroke Father    Multiple sclerosis Sister     Social History Social History   Tobacco Use   Smoking  status: Current Every Day Smoker    Packs/day: 1.00    Types: Cigarettes   Smokeless tobacco: Never Used  Substance Use Topics   Alcohol use: No   Drug use: Yes    Types: Marijuana     Allergies   Codeine   Review of Systems Review of Systems  Constitutional: Negative for chills and fever.  Respiratory: Negative for cough and shortness of breath.   Cardiovascular: Negative for chest pain and palpitations.  Gastrointestinal: Positive for abdominal pain. Negative for blood in stool, diarrhea and vomiting.  Genitourinary: Negative for dysuria and hematuria.  Musculoskeletal: Positive for back pain and myalgias. Negative for arthralgias.  Skin:  Negative for pallor and rash.  Neurological: Negative for seizures, syncope, weakness, light-headedness and headaches.  Psychiatric/Behavioral: Negative for agitation and confusion.  All other systems reviewed and are negative.    Physical Exam Updated Vital Signs BP 126/90    Pulse 74    Temp 98.7 F (37.1 C) (Oral)    Resp 11    SpO2 98%   Physical Exam Vitals signs and nursing note reviewed.  Constitutional:      Appearance: He is well-developed.  HENT:     Head: Normocephalic and atraumatic.  Eyes:     Conjunctiva/sclera: Conjunctivae normal.  Neck:     Musculoskeletal: Neck supple.  Cardiovascular:     Rate and Rhythm: Normal rate and regular rhythm.     Pulses: Normal pulses.     Comments: +2 femoral and pedal pulses bilaterally Pulmonary:     Effort: Pulmonary effort is normal. No respiratory distress.     Breath sounds: Normal breath sounds.  Abdominal:     Palpations: Abdomen is soft.     Comments: Large pulsatile midline abdominal mass  Skin:    General: Skin is warm and dry.  Neurological:     General: No focal deficit present.     Mental Status: He is alert.     Sensory: No sensory deficit.  Psychiatric:        Mood and Affect: Mood normal.        Behavior: Behavior normal.      ED Treatments / Results  Labs (all labs ordered are listed, but only abnormal results are displayed) Labs Reviewed  LIPASE, BLOOD - Abnormal; Notable for the following components:      Result Value   Lipase 55 (*)    All other components within normal limits  COMPREHENSIVE METABOLIC PANEL - Abnormal; Notable for the following components:   Sodium 130 (*)    Chloride 92 (*)    Glucose, Bld 117 (*)    BUN 47 (*)    Creatinine, Ser 2.43 (*)    GFR calc non Af Amer 27 (*)    GFR calc Af Amer 31 (*)    All other components within normal limits  CBC - Abnormal; Notable for the following components:   WBC 10.8 (*)    RBC 3.89 (*)    Hemoglobin 11.4 (*)    HCT 35.1 (*)     All other components within normal limits  URINALYSIS, ROUTINE W REFLEX MICROSCOPIC - Abnormal; Notable for the following components:   APPearance HAZY (*)    Hgb urine dipstick SMALL (*)    Nitrite POSITIVE (*)    Leukocytes,Ua LARGE (*)    WBC, UA >50 (*)    Bacteria, UA MANY (*)    All other components within normal limits  I-STAT CHEM 8, ED -  Abnormal; Notable for the following components:   Sodium 133 (*)    Chloride 97 (*)    BUN 44 (*)    Creatinine, Ser 2.30 (*)    Glucose, Bld 111 (*)    Hemoglobin 12.9 (*)    HCT 38.0 (*)    All other components within normal limits  SARS CORONAVIRUS 2 (HOSPITAL ORDER, [facility])  PROTIME-INR  APTT  HIV ANTIBODY (ROUTINE TESTING W REFLEX)  CBC  COMPREHENSIVE METABOLIC PANEL  CBC  CREATININE, SERUM  TYPE AND SCREEN  ABO/RH    EKG None  Radiology Ct Angio Aortobifemoral W And/or Wo Contrast  Result Date: 01/28/2019 CLINICAL DATA:  68 year old male with known abdominal aortic aneurysm presenting with back and abdominal pain. EXAM: CT ANGIOGRAPHY OF ABDOMINAL AORTA WITH ILIOFEMORAL RUNOFF TECHNIQUE: Multidetector CT imaging of the abdomen, pelvis and lower extremities was performed using the standard protocol during bolus administration of intravenous contrast. Multiplanar CT image reconstructions and MIPs were obtained to evaluate the vascular anatomy. CONTRAST:  16mL OMNIPAQUE IOHEXOL 350 MG/ML SOLN COMPARISON:  CT of the abdomen pelvis dated 01/14/2019 FINDINGS: Evaluation is limited due to streak artifact caused by patient's arms and overlying support wires. Aorta: There is a fusiform large abdominal aortic aneurysm measuring approximately 10 cm in greatest transverse diameter (coronal series 7, image 65) relatively similar in size to the prior CT. The aneurysm measures approximately 15 cm in craniocaudal length and originates approximately 4 cm distal to the take-off of the renal arteries and extends to the  bifurcation of the aorta. There is significant mural thrombus within this aneurysm with high-grade luminal narrowing. There is periaortic haziness similar to prior CT. No definite contrast identified outside of the lumen of the aorta. There is also aneurysmal dilatation of the suprarenal aorta measuring approximately 4.3 cm at the level of the diaphragmatic hiatus. There is aneurysmal dilatation of the common iliac arteries measuring up to 2.2 cm similar to prior CT. There is advanced atherosclerotic calcification of the aorta and iliac arteries. There is mixing of the contrast with non-opacified blood within the distal portion of the abdominal aortic aneurysm. There is faint opacification of the left common iliac artery. The iliac vessels are otherwise not opacified. Celiac: The celiac artery is patent. SMA: The SMA is patent. Renals: The renal arteries are patent. IMA: A vessel anterior to the distal aortic aneurysm may represent the IMA which appears patent. RIGHT Lower Extremity Inflow: Advanced atherosclerotic calcification. Aneurysmal dilatation of the right common iliac artery measuring approximately 2 cm similar to prior CT. Minimal contrast may be present in the right common iliac artery. The right iliac vessels are otherwise not opacified with contrast. Outflow: Advanced atherosclerotic calcification. No opacification with contrast. Runoff: Advanced atherosclerotic calcification. No opacification with contrast. LEFT Lower Extremity Inflow: Faint contrast noted within the left common iliac artery. The remainder of the iliac vessels do not opacified with contrast. There is advanced atherosclerotic calcification of the iliac arteries with bilateral common iliac aneurysms measuring up to 2.2 cm on the left. Outflow: Atherosclerotic calcification. No opacification with contrast. Runoff: Atherosclerotic calcification. No opacification with contrast. Veins: Suboptimally evaluated on this arterial study. No portal  venous gas. Review of the MIP images confirms the above findings. NON-VASCULAR Lower chest: The visualized lung bases are clear. Coronary vascular calcifications noted. No intra-abdominal free air or free fluid. Hepatobiliary: The liver is grossly unremarkable. The gallbladder is suboptimally visualized. Pancreas: The pancreas is grossly unremarkable as visualized. Spleen:  Normal in size without focal abnormality. Adrenals/Urinary Tract: There is no hydronephrosis on either side. Bilateral hypodense renal lesions measure up to 3.5 cm in the superior pole of the left kidney. These lesions are suboptimally characterized but appear grossly similar to prior CT, possibly cysts. No hydronephrosis on either side. The urinary bladder is grossly unremarkable. Stomach/Bowel: Large amount of stool noted throughout the colon. No bowel dilatation or evidence of obstruction. No evidence of acute appendicitis. Lymphatic: No adenopathy. Reproductive: Enlarged prostate gland with median lobe hypertrophy indenting the base of bladder. Other: None Musculoskeletal: Degenerative changes of the spine. No acute osseous pathology. IMPRESSION: 1. Large fusiform abdominal aortic aneurysm measuring up to 10 cm in greatest transverse diameter, similar to prior CT. There is significant mural thrombus within the aneurysm with high-grade luminal narrowing. No definite contrast identified outside of the lumen of the abdominal aortic aneurysm. Vascular surgery consult is advised. 2. Aneurysmal dilatation of the bilateral common iliac arteries measuring up to 2.2 cm similar to prior CT. 3. Advanced atherosclerotic calcification of the aorta and iliac arteries. 4. Dilution of the contrast within the abdominal aortic aneurysm with non opacification of the vessels distal to the aneurysm. 5. Constipation.  No bowel obstruction or active inflammation. 6. Enlarged prostate gland with median lobe hypertrophy indenting the base of the bladder. These results  were called by telephone at the time of interpretation on 01/28/2019 at 8:08 pm to provider Dr. Octaviano Glow , who verbally acknowledged these results. Electronically Signed   By: Anner Crete M.D.   On: 01/28/2019 20:13    Procedures Procedures (including critical care time)  Medications Ordered in ED Medications  sodium chloride flush (NS) 0.9 % injection 3 mL (has no administration in time range)  fentaNYL (SUBLIMAZE) injection 50 mcg (0 mcg Intravenous Hold 01/28/19 1900)  heparin 30,000 units/NS 1000 mL solution for CELLSAVER (has no administration in time range)  heparin 30,000 units/NS 1000 mL solution for CELLSAVER (has no administration in time range)  heparin 30,000 units/NS 1000 mL solution for CELLSAVER (has no administration in time range)  0.9 % irrigation (POUR BTL) (3,000 mLs Irrigation Given 01/28/19 2145)  heparin 6,000 Units in sodium chloride 0.9 % 500 mL irrigation (500 mLs Irrigation Given 01/28/19 2146)  ceFAZolin (ANCEF) 2-4 GM/100ML-% IVPB (has no administration in time range)  potassium chloride SA (K-DUR) CR tablet 20-40 mEq (has no administration in time range)  ondansetron (ZOFRAN) injection 4 mg (has no administration in time range)  alum & mag hydroxide-simeth (MAALOX/MYLANTA) 200-200-20 MG/5ML suspension 15-30 mL (has no administration in time range)  pantoprazole (PROTONIX) EC tablet 40 mg (has no administration in time range)  labetalol (NORMODYNE) injection 10 mg (has no administration in time range)  hydrALAZINE (APRESOLINE) injection 5 mg (has no administration in time range)  metoprolol tartrate (LOPRESSOR) injection 2-5 mg (has no administration in time range)  guaiFENesin-dextromethorphan (ROBITUSSIN DM) 100-10 MG/5ML syrup 15 mL (has no administration in time range)  phenol (CHLORASEPTIC) mouth spray 1 spray (has no administration in time range)  heparin injection 5,000 Units (has no administration in time range)  dextrose 5 %-0.45 % sodium chloride  infusion (has no administration in time range)  acetaminophen (TYLENOL) tablet 325-650 mg (has no administration in time range)    Or  acetaminophen (TYLENOL) suppository 325-650 mg (has no administration in time range)  iohexol (OMNIPAQUE) 350 MG/ML injection 100 mL (100 mLs Intravenous Contrast Given 01/28/19 1917)  propofol (DIPRIVAN) 10 mg/mL bolus/IV push (has no administration  in time range)  fentaNYL (SUBLIMAZE) 250 MCG/5ML injection (has no administration in time range)  midazolam (VERSED) 2 MG/2ML injection (has no administration in time range)  sodium chloride 0.9 % with heparin ADS Med (has no administration in time range)     Initial Impression / Assessment and Plan / ED Course  I have reviewed the triage vital signs and the nursing notes.  Pertinent labs & imaging results that were available during my care of the patient were reviewed by me and considered in my medical decision making (see chart for details).  68 year old gentleman with a history of 9.5 cm intra-abdominal aortic aneurysm presenting to the emergency department with abdominal pain for approximately 1 week.  History is highly concerning for aortic dissection or rupture.  He appears hemodynamically stable in the room and appears quite comfortable.  Please see ED course below for work-up.  Labs have been ordered including type and screen.  Peripheral IVs will be placed.  Patient will need to closely be monitored and we will try to obtain a stat CT angiogram.  I will reach out to his vascular surgery team.  The patient is made n.p.o.  I asked the patient if there is any family member that he wishes [place] to contact, and he reports that he has no immediate family or can.  He has 1 single sister who lives in the [place], and he will try to look up her phone number.  He otherwise has no siblings or living parents.  Clinical Course as of Jan 28 125  Tue Jan 28, 2019  1844 On additional examination the patient is mentating  well, reporting 5 out of 10 pain in his bilateral legs mid abdomen radiating towards his back.  We have placed 2 peripheral IVs and IV fluids.  I have ordered a stat CT NGO and have spoken to CT, they currently have a level 1 trauma but will scan the patient next.  Remaining labs including type and screen have been sent off.  Fentanyl be given for pain.   [MT]  6378 Patient's creatinine is elevated here, however given the emergent nature of his situation I have instructed CT to go ahead with the scan   [MT]  1937 Spoke to the radiologist on the phone who reports no active dissection or bleeding, but does note near complete obstruction of the right iliac artery.  On repeat exam the patient continues to have good peripheral pulses in his bilateral feet complains of no sensory deficits.  His feet are warm and well perfused.   [MT]  1954 Spoke to the vascular surgeon Dr. Gwenlyn Saran, who will come see the patient in the ED.  He was requested a rapid COVID testing for preop clearance.  Will assess whether the patient needs to go to the operating room tonight.   [MT]  2236 Awaiting covid result then to the OR   [MT]    Clinical Course User Index [MT] Langston Masker Carola Rhine, MD       Final Clinical Impressions(s) / ED Diagnoses   Final diagnoses:  Abdominal aortic aneurysm (AAA) without rupture [facility])  Lower abdominal pain    ED Discharge Orders    None       Wyvonnia Dusky, MD 01/29/19 [phone]

## 2019-01-28 NOTE — ED Notes (Signed)
Pt in CT.

## 2019-01-28 NOTE — Anesthesia Preprocedure Evaluation (Addendum)
Anesthesia Evaluation  Patient identified by MRN, date of birth, ID band Patient awake    Reviewed: Allergy & Precautions, NPO status , Patient's Chart, lab work & pertinent test results  History of Anesthesia Complications Negative for: history of anesthetic complications  Airway Mallampati: II  TM Distance: >3 FB Neck ROM: Full    Dental  (+) Dental Advisory Given   Pulmonary shortness of breath, Current Smoker,     + decreased breath sounds      Cardiovascular hypertension, Pt. on medications +CHF and + DOE  + dysrhythmias Atrial Fibrillation  Rhythm:Irregular     Neuro/Psych negative neurological ROS  negative psych ROS   GI/Hepatic negative GI ROS, Neg liver ROS,   Endo/Other  negative endocrine ROS  Renal/GU CRFRenal disease     Musculoskeletal negative musculoskeletal ROS (+)   Abdominal   Peds  Hematology negative hematology ROS (+)   Anesthesia Other Findings 9.5 cm AAA  Left ventricle: The cavity size was severely dilated. Wall   thickness was increased in a pattern of mild LVH. The estimated   ejection fraction was in the range of 10% to 15%. Diffuse   hypokinesis. Abnormal diastolic dysfunction, indeterminant grade.   Doppler parameters are consistent with high ventricular filling   pressure. - Aortic valve: There was mild to moderate regurgitation. Valve   area (VTI): 3.75 cm^2. Valve area (Vmax): 3.71 cm^2. Valve area   (Vmean): 3.48 cm^2. - Aorta: The visualized portion of the proximal ascending aorta is   mildly dilated at 4.2 cm. Aortic root dimension: 48 mm (ED). - Aortic root: The aortic root was moderately to severely dilated. - Mitral valve: There was mild regurgitation. - Left atrium: The atrium was severely dilated. - Right ventricle: The cavity size was moderately dilated. Systolic   function was mildly reduced. - Right atrium: The atrium was severely dilated.  Reproductive/Obstetrics                            Anesthesia Physical Anesthesia Plan  ASA: IV and emergent  Anesthesia Plan: General   Post-op Pain Management:    Induction: Intravenous  PONV Risk Score and Plan: 1 and Ondansetron and Dexamethasone  Airway Management Planned: Oral ETT  Additional Equipment: Arterial line, CVP and Ultrasound Guidance Line Placement  Intra-op Plan:   Post-operative Plan: Possible Post-op intubation/ventilation  Informed Consent: I have reviewed the patients History and Physical, chart, labs and discussed the procedure including the risks, benefits and alternatives for the proposed anesthesia with the patient or authorized representative who has indicated his/her understanding and acceptance.     Dental advisory given  Plan Discussed with: CRNA and Surgeon  Anesthesia Plan Comments:         Anesthesia Quick Evaluation

## 2019-01-28 NOTE — Telephone Encounter (Signed)
Patient called to report increased pain in his low back across to his hips. He saw Dr. Scot Dock for 9.5 cm AAA on 01-15-2019 and we were getting him cleared by cardiology. With this new symptom, I have instructed this patient to go to [facility] ED asap for evaluation. Dr. Scot Dock is off this week and patient had been scheduled for open repair on 9-15. I will inform Dr. Donzetta Matters of this patient.

## 2019-01-28 NOTE — H&P (Addendum)
H+P    Reason for Consult:  AAA Referring Physician:  ED Dr. Langston Masker MRN #:  [id number]  History of Present Illness: This is a 68 y.o. male with known abdominal aortic aneurysm.  He has been sent for cardiac clearance.  Today he called with abdominal pain.  States the pain is really been present for at least 10 days.  Nothing makes it better or worse.  Nonradiating at this time.  Still walking without any limitations.  Is a chronic every day smoker.  Does have long disease with known congestive heart failure.  He has dyspnea with short distance ambulation.  Also has known hypertension and kidney disease.  Does not take any blood thinners.   Past Medical History:  Diagnosis Date  . AAA (abdominal aortic aneurysm) ([place])   . AF (atrial fibrillation) ([place])   . CHF (congestive heart failure) ([place])   . Dyspnea   . Enlarged heart   . Hypertension     Past Surgical History:  Procedure Laterality Date  . APPENDECTOMY    . KNEE SURGERY      Allergies  Allergen Reactions  . Codeine Other (See Comments)    agitation    Prior to Admission medications   Medication Sig Start Date End Date Taking? Authorizing Provider  ALPRAZolam Duanne Moron) 0.25 MG tablet Take 0.25 mg by mouth at bedtime as needed for anxiety.    [provider]  amiodarone (PACERONE) 200 MG tablet Take 200 mg by mouth daily. 08/23/16   [provider]  aspirin 81 MG chewable tablet Chew 81 mg by mouth daily.    [provider]  chlorhexidine (PERIDEX) 0.12 % solution 15 mLs by Mouth Rinse route 2 (two) times daily at 8 am and 10 pm. 08/27/18   [provider]  digoxin (LANOXIN) 0.125 MG tablet Take 1 tablet (0.125 mg total) by mouth daily. 10/03/16   Clegg, Amy D, NP  furosemide (LASIX) 40 MG tablet Take 2 tablets (80 mg total) by mouth daily. 10/02/16   Clegg, Amy D, NP  hydrALAZINE (APRESOLINE) 25 MG tablet Take 1 tablet (25 mg total) by mouth every 8 (eight) hours. 10/02/16   Clegg, Amy D, NP   isosorbide mononitrate (IMDUR) 30 MG 24 hr tablet Take 1 tablet (30 mg total) by mouth daily. 10/03/16   Clegg, Amy D, NP  spironolactone (ALDACTONE) 25 MG tablet Take 0.5 tablets (12.5 mg total) by mouth daily. 10/02/16   Conrad Inglewood, NP    Social History   Socioeconomic History  . Marital status: Single    Spouse name: Not on file  . Number of children: Not on file  . Years of education: Not on file  . Highest education level: Not on file  Occupational History  . Not on file  Social Needs  . Financial resource strain: Not on file  . Food insecurity    Worry: Not on file    Inability: Not on file  . Transportation needs    Medical: Not on file    Non-medical: Not on file  Tobacco Use  . Smoking status: Current Every Day Smoker    Packs/day: 1.00    Types: Cigarettes  . Smokeless tobacco: Never Used  Substance and Sexual Activity  . Alcohol use: No  . Drug use: Yes    Types: Marijuana  . Sexual activity: Never  Lifestyle  . Physical activity    Days per week: Not on file    Minutes per session: Not  on file  . Stress: Not on file  Relationships  . Social Herbalist on phone: Not on file    Gets together: Not on file    Attends religious service: Not on file    Active member of club or organization: Not on file    Attends meetings of clubs or organizations: Not on file    Relationship status: Not on file  . Intimate partner violence    Fear of current or ex partner: Not on file    Emotionally abused: Not on file    Physically abused: Not on file    Forced sexual activity: Not on file  Other Topics Concern  . Not on file  Social History Narrative  . Not on file    Family History  Problem Relation Age of Onset  . Stroke Father   . Multiple sclerosis Sister     ROS:   Cardiovascular: []  chest pain/pressure []  palpitations []  SOB lying flat []  DOE []  pain in legs while walking []  pain in legs at rest []  pain in legs at night []  non-healing  ulcers []  hx of DVT []  swelling in legs  Pulmonary: []  productive cough [x]  asthma/wheezing []  home O2  Neurologic: []  weakness in []  arms []  legs []  numbness in []  arms []  legs []  hx of CVA []  mini stroke [] difficulty speaking or slurred speech []  temporary loss of vision in one eye []  dizziness  Hematologic: []  hx of cancer []  bleeding problems []  problems with blood clotting easily  Endocrine:   []  diabetes []  thyroid disease  GI [x]  abdominal pain []  blood in stool  GU: [x]  CKD/renal failure []  HD--[]  M/W/F or []  T/T/S []  burning with urination []  blood in urine  Psychiatric: []  anxiety []  depression  Musculoskeletal: []  arthritis []  joint pain  Integumentary: []  rashes []  ulcers  Constitutional: []  fever []  chills   Physical Examination  Vitals:   01/28/19 1844 01/28/19 1845  BP: 107/87 119/81  Pulse:  85  Resp:  16  Temp:    SpO2:  100%   There is no height or weight on file to calculate BMI.  General:  nad HENT: WNL, normocephalic Pulmonary: normal non-labored breathing Cardiac: palpable femoral, popliteal and pt pulses Abdomen: soft, large palpable pulsatile mass consistent with aaa is tender to palpation, no rebound or guarding  Extremities: well perfused Musculoskeletal: no muscle wasting or atrophy  Neurologic: A&O X 3; Appropriate Affect ; SENSATION: normal; MOTOR FUNCTION:  moving all extremities equally. Speech is fluent/normal  CBC    Component Value Date/Time   WBC 10.8 (H) 01/28/2019 1813   RBC 3.89 (L) 01/28/2019 1813   HGB 12.9 (L) 01/28/2019 1851   HCT 38.0 (L) 01/28/2019 1851   PLT 301 01/28/2019 1813   MCV 90.2 01/28/2019 1813   MCH 29.3 01/28/2019 1813   MCHC 32.5 01/28/2019 1813   RDW 13.9 01/28/2019 1813   LYMPHSABS 1.2 01/14/2019 1045   MONOABS 0.7 01/14/2019 1045   EOSABS 0.2 01/14/2019 1045   BASOSABS 0.0 01/14/2019 1045    BMET    Component Value Date/Time   NA 133 (L) 01/28/2019 1851   K 4.2  01/28/2019 1851   CL 97 (L) 01/28/2019 1851   CO2 25 01/28/2019 1813   GLUCOSE 111 (H) 01/28/2019 1851   BUN 44 (H) 01/28/2019 1851   CREATININE 2.30 (H) 01/28/2019 1851   CALCIUM 9.6 01/28/2019 1813   GFRNONAA 27 (L) 01/28/2019 1813  GFRAA 31 (L) 01/28/2019 1813    COAGS: Lab Results  Component Value Date   INR 1.1 01/28/2019     Non-Invasive Vascular Imaging:   I have reviewed CT angio with large AAA   ASSESSMENT/PLAN: This is a 68 y.o. male with large symptomatic abdominal aortic aneurysm.  Does not appear to be candidate for endovascular repair given tortuous iliacs very short neck and heavy calcification of his common femoral arteries.  I discussed with him open repair including the risk of myocardial infarction, renal failure requiring dialysis, need for blood transfusion, and death.  He demonstrates good understanding.  He does have a sister who lives in [place] no other local family.  He agrees to proceed after covid test tonight.    C. Donzetta Matters, MD [facility] Office: [phone] Pager: [phone]  Addendum: Patient has covid results negative. Now wants to wait to get his affairs in order prior to considering repair. Will get cardiology to evaluate tomorrow.   Servando Snare

## 2019-01-28 NOTE — ED Triage Notes (Signed)
Patient sent by doctor d/t new onset lower back pain that radiates across his hips today. He was seen at [facility] last week and had CT scan revealing 9.5cm abdominal aortic aneurysm. He has scheduled open repair on 9/15, but d/t new symptoms, told to come here for evaluation.

## 2019-01-29 ENCOUNTER — Inpatient Hospital Stay (HOSPITAL_COMMUNITY): Payer: Medicare Other

## 2019-01-29 ENCOUNTER — Encounter (HOSPITAL_COMMUNITY): Payer: Self-pay | Admitting: Physician Assistant

## 2019-01-29 ENCOUNTER — Encounter (HOSPITAL_COMMUNITY): Payer: Self-pay | Admitting: Anesthesiology

## 2019-01-29 DIAGNOSIS — Z01810 Encounter for preprocedural cardiovascular examination: Secondary | ICD-10-CM

## 2019-01-29 DIAGNOSIS — I714 Abdominal aortic aneurysm, without rupture, unspecified: Secondary | ICD-10-CM | POA: Diagnosis present

## 2019-01-29 LAB — CBC
HCT: 34.3 % — ABNORMAL LOW (ref 39.0–52.0)
Hemoglobin: 11.5 g/dL — ABNORMAL LOW (ref 13.0–17.0)
MCH: 29.3 pg (ref 26.0–34.0)
MCHC: 33.5 g/dL (ref 30.0–36.0)
MCV: 87.5 fL (ref 80.0–100.0)
Platelets: 267 10*3/uL (ref 150–400)
RBC: 3.92 MIL/uL — ABNORMAL LOW (ref 4.22–5.81)
RDW: 13.9 % (ref 11.5–15.5)
WBC: 9 10*3/uL (ref 4.0–10.5)
nRBC: 0 % (ref 0.0–0.2)

## 2019-01-29 LAB — COMPREHENSIVE METABOLIC PANEL
ALT: 17 U/L (ref 0–44)
AST: 20 U/L (ref 15–41)
Albumin: 3.7 g/dL (ref 3.5–5.0)
Alkaline Phosphatase: 46 U/L (ref 38–126)
Anion gap: 13 (ref 5–15)
BUN: 46 mg/dL — ABNORMAL HIGH (ref 8–23)
CO2: 25 mmol/L (ref 22–32)
Calcium: 9.7 mg/dL (ref 8.9–10.3)
Chloride: 97 mmol/L — ABNORMAL LOW (ref 98–111)
Creatinine, Ser: 2.34 mg/dL — ABNORMAL HIGH (ref 0.61–1.24)
GFR calc Af Amer: 32 mL/min — ABNORMAL LOW (ref >60)
GFR calc non Af Amer: 28 mL/min — ABNORMAL LOW (ref >60)
Glucose, Bld: 100 mg/dL — ABNORMAL HIGH (ref 70–99)
Potassium: 4.1 mmol/L (ref 3.5–5.1)
Sodium: 135 mmol/L (ref 135–145)
Total Bilirubin: 0.8 mg/dL (ref 0.3–1.2)
Total Protein: 8 g/dL (ref 6.5–8.1)

## 2019-01-29 LAB — PSA: Prostatic Specific Antigen: 3.5 ng/mL (ref 0.00–4.00)

## 2019-01-29 LAB — ECHOCARDIOGRAM COMPLETE
Height: 72 in
Weight: 2229.29 oz

## 2019-01-29 LAB — HIV ANTIBODY (ROUTINE TESTING W REFLEX): HIV Screen 4th Generation wRfx: NONREACTIVE

## 2019-01-29 MED ORDER — DEXTROSE-NACL 5-0.45 % IV SOLN
INTRAVENOUS | Status: DC
  Administered 2019-01-29 – 2019-01-30 (×3): via INTRAVENOUS

## 2019-01-29 MED ORDER — GUAIFENESIN-DM 100-10 MG/5ML PO SYRP: 15.0000 mL | ORAL_SOLUTION | ORAL | Status: DC | PRN

## 2019-01-29 MED ORDER — METOPROLOL TARTRATE 5 MG/5ML IV SOLN: 2.0000 mg | INTRAVENOUS | Status: DC | PRN

## 2019-01-29 MED ORDER — LORAZEPAM 2 MG/ML IJ SOLN
1.0000 mg | Freq: Once | INTRAMUSCULAR | Status: AC
  Administered 2019-01-29: 1 mg via INTRAVENOUS
  Filled 2019-01-29: qty 1

## 2019-01-29 MED ORDER — ALPRAZOLAM 0.25 MG PO TABS
0.2500 mg | ORAL_TABLET | Freq: Three times a day (TID) | ORAL | Status: DC | PRN
  Administered 2019-01-29 – 2019-02-05 (×16): 0.25 mg via ORAL
  Filled 2019-01-29 (×17): qty 1

## 2019-01-29 MED ORDER — ALUM & MAG HYDROXIDE-SIMETH 200-200-20 MG/5ML PO SUSP: 15.0000 mL | ORAL | Status: DC | PRN

## 2019-01-29 MED ORDER — FENTANYL CITRATE (PF) 100 MCG/2ML IJ SOLN
50.0000 ug | INTRAMUSCULAR | Status: DC | PRN
  Administered 2019-01-29 – 2019-02-03 (×18): 50 ug via INTRAVENOUS
  Filled 2019-01-29 (×17): qty 2

## 2019-01-29 MED ORDER — LABETALOL HCL 5 MG/ML IV SOLN: 10.0000 mg | INTRAVENOUS | Status: DC | PRN

## 2019-01-29 MED ORDER — ACETAMINOPHEN 325 MG PO TABS
325.0000 mg | ORAL_TABLET | ORAL | Status: DC | PRN
  Filled 2019-01-29: qty 2

## 2019-01-29 MED ORDER — PHENOL 1.4 % MT LIQD: 1.0000 | OROMUCOSAL | Status: DC | PRN

## 2019-01-29 MED ORDER — ENSURE ENLIVE PO LIQD
237.0000 mL | Freq: Two times a day (BID) | ORAL | Status: DC
  Administered 2019-01-29 – 2019-02-03 (×10): 237 mL via ORAL

## 2019-01-29 MED ORDER — PANTOPRAZOLE SODIUM 40 MG PO TBEC
40.0000 mg | DELAYED_RELEASE_TABLET | Freq: Every day | ORAL | Status: DC
  Administered 2019-01-29 – 2019-02-02 (×5): 40 mg via ORAL
  Filled 2019-01-29 (×5): qty 1

## 2019-01-29 MED ORDER — ATORVASTATIN CALCIUM 40 MG PO TABS
40.0000 mg | ORAL_TABLET | Freq: Every day | ORAL | Status: DC
  Administered 2019-01-29 – 2019-02-04 (×7): 40 mg via ORAL
  Filled 2019-01-29 (×7): qty 1

## 2019-01-29 MED ORDER — ACETAMINOPHEN 325 MG RE SUPP
325.0000 mg | RECTAL | Status: DC | PRN
  Filled 2019-01-29: qty 2

## 2019-01-29 MED ORDER — FENTANYL CITRATE (PF) 100 MCG/2ML IJ SOLN
INTRAMUSCULAR | Status: DC | PRN
  Administered 2019-01-29: 50 ug via INTRAVENOUS

## 2019-01-29 MED ORDER — HYDRALAZINE HCL 20 MG/ML IJ SOLN: 5.0000 mg | INTRAMUSCULAR | Status: DC | PRN

## 2019-01-29 MED ORDER — POTASSIUM CHLORIDE CRYS ER 20 MEQ PO TBCR
20.0000 meq | EXTENDED_RELEASE_TABLET | Freq: Once | ORAL | Status: DC
  Filled 2019-01-29 (×2): qty 2

## 2019-01-29 MED ORDER — HEPARIN SODIUM (PORCINE) 5000 UNIT/ML IJ SOLN
5000.0000 [IU] | Freq: Three times a day (TID) | INTRAMUSCULAR | Status: DC
  Administered 2019-01-29 – 2019-02-05 (×19): 5000 [IU] via SUBCUTANEOUS
  Filled 2019-01-29 (×18): qty 1

## 2019-01-29 MED ORDER — ONDANSETRON HCL 4 MG/2ML IJ SOLN: 4.0000 mg | Freq: Four times a day (QID) | INTRAMUSCULAR | Status: DC | PRN
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
## 2019-01-29 NOTE — Addendum Note (Signed)
Addendum  created 01/29/19 0126 by Suzy Bouchard, CRNA   Intraprocedure Devices edited, Intraprocedure Event edited, Intraprocedure Flowsheets edited, Intraprocedure Meds edited, Patient device added, Patient device edited

## 2019-01-29 NOTE — Progress Notes (Signed)
  Echocardiogram 2D Echocardiogram has been performed.  Darlina Sicilian M 01/29/2019, 2:12 PM

## 2019-01-29 NOTE — Progress Notes (Signed)
Spiritual Consult for prayer and advance directive.  Pt. Experiencing anxiety around emergency surgery which he declined in the OR theatre last evening according to his nurse.  Pt. Is scheduled for this surgery next week but got scared he was going to die in surgery last night.  Unsure if he wants to proceed with surgery or leave his life in God's hands. Presented Advanced Directive. Planned follow up with pt for tomorrow morning to check on progress with AD and thoughts about whether to have surgery.
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
## 2019-01-29 NOTE — Progress Notes (Signed)
Initial Nutrition Assessment  DOCUMENTATION CODES:   Underweight  INTERVENTION:   -Ensure Enlive po BID, each supplement provides 350 kcal and 20 grams of protein  NUTRITION DIAGNOSIS:   Unintentional weight loss related to catabolic illness as evidenced by per patient/family report, percent weight loss.  GOAL:   Patient will meet greater than or equal to 90% of their needs  MONITOR:   PO intake, Supplement acceptance, Labs, Weight trends, I & O's  REASON FOR ASSESSMENT:   Malnutrition Screening Tool    ASSESSMENT:   68 y.o. male with known abdominal aortic aneurysm. Admitted for surgery.  **RD working remotely**  Per cardiology note, pt to have open aortic aneurysm repair.  Patient reports good appetite and has been eating well recently. Pt was NPO earlier today but now is on a heart healthy diet. RD will order Ensure supplements to provide additional kcal and protein.  Per weight records, pt has lost 56 lbs since 10/02/16 which correlates with what pt reports, ~50 lbs over the past 2 years. Most recently pt has lost 8 lbs since 8/25 (5% wt loss 2 weeks, significant for time frame).   Medications: D5 infusion  Labs reviewed:  GFR:28  NUTRITION - FOCUSED PHYSICAL EXAM:  Unable to perform -working remotely.  Diet Order:   Diet Order            Diet Heart Room service appropriate? Yes; Fluid consistency: Thin  Diet effective now              EDUCATION NEEDS:   No education needs have been identified at this time  Skin:  Skin Assessment: Reviewed RN Assessment  Last BM:  PTA  Height:   Ht Readings from Last 1 Encounters:  01/29/19 6' (1.829 m)    Weight:   Wt Readings from Last 1 Encounters:  01/29/19 63.2 kg    Ideal Body Weight:  80.9 kg  BMI:  Body mass index is 18.9 kg/m.  Estimated Nutritional Needs:   Kcal:  1550-1750  Protein:  75-85g  Fluid:  1.8L/day  John Bibles, MS, RD, LDN Inpatient Clinical Dietitian Pager:  [phone] After Hours Pager: [phone]

## 2019-01-29 NOTE — Progress Notes (Signed)
Responded to [facility] to pray with patient and assist with AD.  Upon arrival patient was sleeping prayed over patient. Spoke with patient nurse who said that patient is having surgery and wanted to complete AD prior to Surgery.  Nurse will give AD to patient when he awakes and will have Chaplain paged when ready.  Chaplain available as needed.   Jaclynn Major, [place], [facility], Pager [phone]

## 2019-01-29 NOTE — Progress Notes (Addendum)
  Progress Note    01/29/2019 8:02 AM Hospital Day 1  Subjective:  Says his pain is still there but better but it comes and goes.  Afebrile HR 60's-80's NSR 17'P-102'H systolic 85% RA  Vitals:   01/29/19 0131 01/29/19 0445  BP: 108/72 104/76  Pulse: 68 84  Resp: 16 14  Temp: 98.9 F (37.2 C) 98 F (36.7 C)  SpO2: 98% 98%    Physical Exam: Cardiac:  regular Lungs:  Non labored Abdomen:  +large pulsatile mass Extremities:  Unable to palpable distal pulses but feet are warm and well perfused.   CBC    Component Value Date/Time   WBC 9.0 01/29/2019 0242   RBC 3.92 (L) 01/29/2019 0242   HGB 11.5 (L) 01/29/2019 0242   HCT 34.3 (L) 01/29/2019 0242   PLT 267 01/29/2019 0242   MCV 87.5 01/29/2019 0242   MCH 29.3 01/29/2019 0242   MCHC 33.5 01/29/2019 0242   RDW 13.9 01/29/2019 0242   LYMPHSABS 1.2 01/14/2019 1045   MONOABS 0.7 01/14/2019 1045   EOSABS 0.2 01/14/2019 1045   BASOSABS 0.0 01/14/2019 1045    BMET    Component Value Date/Time   NA 135 01/29/2019 0242   K 4.1 01/29/2019 0242   CL 97 (L) 01/29/2019 0242   CO2 25 01/29/2019 0242   GLUCOSE 100 (H) 01/29/2019 0242   BUN 46 (H) 01/29/2019 0242   CREATININE 2.34 (H) 01/29/2019 0242   CALCIUM 9.7 01/29/2019 0242   GFRNONAA 28 (L) 01/29/2019 0242   GFRAA 32 (L) 01/29/2019 0242    INR    Component Value Date/Time   INR 1.1 01/28/2019 1841    No intake or output data in the 24 hours ending 01/29/19 0802   Assessment/Plan:  68 y.o. male with 9.7cm AAA Hospital Day 1  -pt was to undergo repair of 9.7cm AAA last evening, however decided against this to get his affairs in order.  Will ask cardiology to see pt today and hopefully plan for surgery in the near future.  -creatinine only up slightly to 2.34 after CTA.  UOP not recorded.  This is some urine in urinal but minimal. -decrease in hgb from 12.9 to 11.5.  Pt is hemodynamically stable and pain improved at this point.   Pt is on IVF @ 50cc/hr -Dr.  Donzetta Matters to see pt this am.   Leontine Locket, PA-C [facility] [phone] 01/29/2019 8:02 AM   I have independently interviewed and examined patient and agree with PA assessment and plan above.  Cardiology has evaluated and input is much appreciated.  Patient very high risk for surgery.  We will follow-up echo and discuss options with patient.  If he chooses not to have repair we will get palliative care involved prior to discharge.  Jereline Ticer C. Donzetta Matters, MD [facility] [place] Office: [phone] Pager: [phone]

## 2019-01-30 NOTE — Progress Notes (Addendum)
  Progress Note    01/30/2019 8:32 AM 2 Days Post-Op  Subjective:  No new abd or back pain    Vitals:   01/29/19 2039 01/30/19 0500  BP: 111/70 92/67  Pulse: 65 (!) 56  Resp: 18 16  Temp: 98.1 F (36.7 C) 97.8 F (36.6 C)  SpO2: (!) 73% 97%   Physical Exam: Lungs:  Non labored Extremities:  Feet warm Abdomen:  Prominent aorta, soft, NT Neurologic: A&O  CBC    Component Value Date/Time   WBC 9.0 01/29/2019 0242   RBC 3.92 (L) 01/29/2019 0242   HGB 11.5 (L) 01/29/2019 0242   HCT 34.3 (L) 01/29/2019 0242   PLT 267 01/29/2019 0242   MCV 87.5 01/29/2019 0242   MCH 29.3 01/29/2019 0242   MCHC 33.5 01/29/2019 0242   RDW 13.9 01/29/2019 0242   LYMPHSABS 1.2 01/14/2019 1045   MONOABS 0.7 01/14/2019 1045   EOSABS 0.2 01/14/2019 1045   BASOSABS 0.0 01/14/2019 1045    BMET    Component Value Date/Time   NA 135 01/29/2019 0242   K 4.1 01/29/2019 0242   CL 97 (L) 01/29/2019 0242   CO2 25 01/29/2019 0242   GLUCOSE 100 (H) 01/29/2019 0242   BUN 46 (H) 01/29/2019 0242   CREATININE 2.34 (H) 01/29/2019 0242   CALCIUM 9.7 01/29/2019 0242   GFRNONAA 28 (L) 01/29/2019 0242   GFRAA 32 (L) 01/29/2019 0242    INR    Component Value Date/Time   INR 1.1 01/28/2019 1841     Intake/Output Summary (Last 24 hours) at 01/30/2019 4825 Last data filed at 01/30/2019 0545 Gross per 24 hour  Intake 705.97 ml  Output 150 ml  Net 555.97 ml     Assessment/Plan:  68 y.o. male with 10cm AAA 2 Days Post-Op   10cm AAA and 5.2cm ascending arch High risk surgical candidate per Cardiology however EF drastically improved on echo Patient states he would rather diet from surgery than from rupture Will discuss plan with attending    Dagoberto Ligas, PA-C [facility] [phone] 01/30/2019 8:32 AM   I have independently interviewed and examined patient and agree with PA assessment and plan above.  We will plan for endovascular aneurysm repair with possible  endo-anchors and possible snorkel of at least the left renal from a subclavian approach.  We will plan this on Monday supposing he has no issues over the weekend.  Omar Orrego C. Donzetta Matters, MD [facility] [place] Office: [phone] Pager: [phone]

## 2019-01-30 NOTE — Progress Notes (Signed)
Pt completed Advance Directive.  Will have notarized when volunteers come back on service tomorrow morning.  Friends from [place] left a positive prayer VM which lifted Pt spirits even higher.  Pt appears so much more at ease than 24 hours ago and very hopeful for a successful outcome.  Notary scheduled tomorrow morning.  Pastoral care follow up will continue.  De Burrs  Chaplain Resident [phone]

## 2019-01-30 NOTE — Progress Notes (Signed)
Followed up with pt re. Advance Directive.  Pt still needed some clarification which was given.  Pt not sure he wants to put the burden of making decision to "kill him" on a family member and/or friend.  Chaplain clarified they would not be "killing him;" rather they would be expressing his wishes in case he isn't able to. Chaplain assured pt ok if he doesn't want to sign it.  He will talk with family member and friend today with plans in place for Chaplain to follow-up with his decision later in the day.  De Burrs Chaplain Resident Pager:  [phone]

## 2019-01-30 NOTE — Care Management (Signed)
1130 01-30-19 CM spoke with patient regarding PCP and he sees Claiborne Billings NP at the [facility]. CM did get permission to call the [place] clinic. CM did call [facility] and he gets medications via mail order. CM will fax information addressed to NP [facility] fax # [phone]. H&P, d/c summary and prescriptions need to be faxed. No further needs from CM at this time. Bethena Roys, RN, BSN CM [phone]

## 2019-01-31 ENCOUNTER — Inpatient Hospital Stay (HOSPITAL_COMMUNITY): Admission: RE | Admit: 2019-01-31 | Payer: No Typology Code available for payment source | Source: Ambulatory Visit

## 2019-01-31 ENCOUNTER — Ambulatory Visit: Payer: No Typology Code available for payment source | Admitting: Cardiovascular Disease

## 2019-01-31 LAB — BASIC METABOLIC PANEL
Anion gap: 11 (ref 5–15)
BUN: 63 mg/dL — ABNORMAL HIGH (ref 8–23)
CO2: 26 mmol/L (ref 22–32)
Calcium: 9 mg/dL (ref 8.9–10.3)
Chloride: 98 mmol/L (ref 98–111)
Creatinine, Ser: 2.68 mg/dL — ABNORMAL HIGH (ref 0.61–1.24)
GFR calc Af Amer: 27 mL/min — ABNORMAL LOW (ref >60)
GFR calc non Af Amer: 24 mL/min — ABNORMAL LOW (ref >60)
Glucose, Bld: 104 mg/dL — ABNORMAL HIGH (ref 70–99)
Potassium: 4 mmol/L (ref 3.5–5.1)
Sodium: 135 mmol/L (ref 135–145)

## 2019-01-31 MED ORDER — ASPIRIN EC 81 MG PO TBEC
81.0000 mg | DELAYED_RELEASE_TABLET | Freq: Every day | ORAL | Status: DC
  Administered 2019-01-31 – 2019-02-02 (×3): 81 mg via ORAL
  Filled 2019-01-31 (×3): qty 1

## 2019-01-31 NOTE — Progress Notes (Signed)
  Progress Note    01/31/2019 7:50 AM 3 Days Post-Op  Subjective: No new complaints  Vitals:   01/31/19 0048 01/31/19 0549  BP:  (!) 95/52  Pulse:  65  Resp: (!) 9 20  Temp:  98.6 F (37 C)  SpO2:  96%    Physical Exam: Awake alert oriented  Nonlabored respirations Abdomen is soft there is palpable mass Palpable left brachial, bilateral common femoral pulses  CBC    Component Value Date/Time   WBC 9.0 01/29/2019 0242   RBC 3.92 (L) 01/29/2019 0242   HGB 11.5 (L) 01/29/2019 0242   HCT 34.3 (L) 01/29/2019 0242   PLT 267 01/29/2019 0242   MCV 87.5 01/29/2019 0242   MCH 29.3 01/29/2019 0242   MCHC 33.5 01/29/2019 0242   RDW 13.9 01/29/2019 0242   LYMPHSABS 1.2 01/14/2019 1045   MONOABS 0.7 01/14/2019 1045   EOSABS 0.2 01/14/2019 1045   BASOSABS 0.0 01/14/2019 1045    BMET    Component Value Date/Time   NA 135 01/31/2019 0323   K 4.0 01/31/2019 0323   CL 98 01/31/2019 0323   CO2 26 01/31/2019 0323   GLUCOSE 104 (H) 01/31/2019 0323   BUN 63 (H) 01/31/2019 0323   CREATININE 2.68 (H) 01/31/2019 0323   CALCIUM 9.0 01/31/2019 0323   GFRNONAA 24 (L) 01/31/2019 0323   GFRAA 27 (L) 01/31/2019 0323    INR    Component Value Date/Time   INR 1.1 01/28/2019 1841     Intake/Output Summary (Last 24 hours) at 01/31/2019 0750 Last data filed at 01/30/2019 1930 Gross per 24 hour  Intake 240 ml  Output 100 ml  Net 140 ml     Assessment/plan:  68 y.o. male is here with nearly 10 cm dominant aortic aneurysm that was thought to be symptomatic.  He was scheduled for open repair now we are planning complex endovascular repair Monday morning.  He is on aspirin and statin has been added by cardiology and he is high risk for any surgery.    Jeronda Don C. Donzetta Matters, MD [facility] [place] Office: [phone] Pager: [phone]  01/31/2019 7:50 AM

## 2019-02-01 LAB — BPAM RBC
Blood Product Expiration Date: 202010132359
Blood Product Expiration Date: 202010132359
Blood Product Expiration Date: 202010132359
Blood Product Expiration Date: 202010132359
Blood Product Expiration Date: 202010132359
Blood Product Expiration Date: 202010132359
ISSUE DATE / TIME: 202009082056
ISSUE DATE / TIME: 202009082057
ISSUE DATE / TIME: 202009090911
ISSUE DATE / TIME: 202009090911
Unit Type and Rh: 5100
Unit Type and Rh: 5100
Unit Type and Rh: 5100
Unit Type and Rh: 5100
Unit Type and Rh: 5100
Unit Type and Rh: 5100

## 2019-02-01 LAB — TYPE AND SCREEN
ABO/RH(D): O POS
Antibody Screen: NEGATIVE
Unit division: 0
Unit division: 0
Unit division: 0
Unit division: 0
Unit division: 0
Unit division: 0

## 2019-02-01 NOTE — Progress Notes (Signed)
  Progress Note    02/01/2019 11:23 AM 4 Days Post-Op  Subjective: Has stable left abdominal and flank pain  Vitals:   01/31/19 2100 02/01/19 0455  BP: 105/68 (!) 88/65  Pulse: (!) 56 60  Resp: 16 20  Temp: 98.4 F (36.9 C) 98.2 F (36.8 C)  SpO2: 98% 100%    Physical Exam: Awake alert oriented On the respirations Abdomen with large palpable mass Palpable common femoral pulses are heavily calcified Left brachial pulse easily palpable  CBC    Component Value Date/Time   WBC 9.0 01/29/2019 0242   RBC 3.92 (L) 01/29/2019 0242   HGB 11.5 (L) 01/29/2019 0242   HCT 34.3 (L) 01/29/2019 0242   PLT 267 01/29/2019 0242   MCV 87.5 01/29/2019 0242   MCH 29.3 01/29/2019 0242   MCHC 33.5 01/29/2019 0242   RDW 13.9 01/29/2019 0242   LYMPHSABS 1.2 01/14/2019 1045   MONOABS 0.7 01/14/2019 1045   EOSABS 0.2 01/14/2019 1045   BASOSABS 0.0 01/14/2019 1045    BMET    Component Value Date/Time   NA 135 01/31/2019 0323   K 4.0 01/31/2019 0323   CL 98 01/31/2019 0323   CO2 26 01/31/2019 0323   GLUCOSE 104 (H) 01/31/2019 0323   BUN 63 (H) 01/31/2019 0323   CREATININE 2.68 (H) 01/31/2019 0323   CALCIUM 9.0 01/31/2019 0323   GFRNONAA 24 (L) 01/31/2019 0323   GFRAA 27 (L) 01/31/2019 0323    INR    Component Value Date/Time   INR 1.1 01/28/2019 1841    No intake or output data in the 24 hours ending 02/01/19 1123   Assessment/plan:  68 y.o. male is here with large abdominal aortic aneurysm.  Patient will need endovascular repair we will plan this on Monday morning.  I discussed risk of his attorneys he demonstrates good understanding.  Cardiology input appreciated.   Mikaylah Libbey C. Donzetta Matters, MD [facility] [place] Office: [phone] Pager: [phone]  02/01/2019 11:23 AM

## 2019-02-01 NOTE — Plan of Care (Signed)
  Problem: Activity: Goal: Ability to return to baseline activity level will improve Outcome: Progressing   Problem: Education: Goal: Understanding of CV disease, CV risk reduction, and recovery process will improve Outcome: Progressing Goal: Individualized Educational Video(s) Outcome: Progressing

## 2019-02-02 LAB — BASIC METABOLIC PANEL
Anion gap: 10 (ref 5–15)
BUN: 55 mg/dL — ABNORMAL HIGH (ref 8–23)
CO2: 27 mmol/L (ref 22–32)
Calcium: 8.9 mg/dL (ref 8.9–10.3)
Chloride: 103 mmol/L (ref 98–111)
Creatinine, Ser: 1.83 mg/dL — ABNORMAL HIGH (ref 0.61–1.24)
GFR calc Af Amer: 44 mL/min — ABNORMAL LOW (ref >60)
GFR calc non Af Amer: 38 mL/min — ABNORMAL LOW (ref >60)
Glucose, Bld: 128 mg/dL — ABNORMAL HIGH (ref 70–99)
Potassium: 4 mmol/L (ref 3.5–5.1)
Sodium: 140 mmol/L (ref 135–145)

## 2019-02-02 LAB — MRSA PCR SCREENING: MRSA by PCR: NEGATIVE

## 2019-02-02 MED ORDER — POLYETHYLENE GLYCOL 3350 17 G PO PACK
17.0000 g | PACK | Freq: Every day | ORAL | Status: DC
  Administered 2019-02-02: 17 g via ORAL
  Filled 2019-02-02: qty 1

## 2019-02-02 MED ORDER — SODIUM CHLORIDE 0.9% IV SOLUTION
Freq: Once | INTRAVENOUS | Status: AC
  Administered 2019-02-02: 13:00:00 via INTRAVENOUS

## 2019-02-02 NOTE — Progress Notes (Signed)
  Progress Note    02/02/2019 10:54 AM 5 Days Post-Op  Subjective: No overnight issues he has multiple questions to be answered today  Vitals:   02/01/19 2044 02/02/19 0548  BP: (!) 142/84 94/62  Pulse: (!) 58 (!) 49  Resp:  18  Temp: 97.8 F (36.6 C) 98.1 F (36.7 C)  SpO2: 100% 98%    Physical Exam: Awake alert oriented Nonlabored respirations Abdomen is soft Palpable bilateral common femoral pulses Palpable left brachial pulse  CBC    Component Value Date/Time   WBC 9.0 01/29/2019 0242   RBC 3.92 (L) 01/29/2019 0242   HGB 11.5 (L) 01/29/2019 0242   HCT 34.3 (L) 01/29/2019 0242   PLT 267 01/29/2019 0242   MCV 87.5 01/29/2019 0242   MCH 29.3 01/29/2019 0242   MCHC 33.5 01/29/2019 0242   RDW 13.9 01/29/2019 0242   LYMPHSABS 1.2 01/14/2019 1045   MONOABS 0.7 01/14/2019 1045   EOSABS 0.2 01/14/2019 1045   BASOSABS 0.0 01/14/2019 1045    BMET    Component Value Date/Time   NA 135 01/31/2019 0323   K 4.0 01/31/2019 0323   CL 98 01/31/2019 0323   CO2 26 01/31/2019 0323   GLUCOSE 104 (H) 01/31/2019 0323   BUN 63 (H) 01/31/2019 0323   CREATININE 2.68 (H) 01/31/2019 0323   CALCIUM 9.0 01/31/2019 0323   GFRNONAA 24 (L) 01/31/2019 0323   GFRAA 27 (L) 01/31/2019 0323    INR    Component Value Date/Time   INR 1.1 01/28/2019 1841    No intake or output data in the 24 hours ending 02/02/19 1054   Assessment:  68 y.o. male is here with large symptomatic abdominal aortic aneurysm  Plan: OR tomorrow for endovascular aneurysm repair which may require renal artery stenting N.p.o. past midnight Type and screen and preop labs ordered for tomorrow DVT prophylaxis: Subcutaneous heparin okay to administer tomorrow morning prior to surgery  VQI: On aspirin and lipitor   Jaisean Monteforte C. Donzetta Matters, MD [facility] [place] Office: [phone] Pager: [phone]  02/02/2019 10:54 AM

## 2019-02-02 NOTE — Progress Notes (Signed)
Said good night to Mr. Leverich and wished him well on his surgery tomorrow morning.  It is a risky surgery and he has been afraid he will not survive it.  Offered prayer at bedside.  [place] Chaplain Resident [phone]

## 2019-02-03 ENCOUNTER — Inpatient Hospital Stay (HOSPITAL_COMMUNITY): Payer: Medicare Other | Admitting: Anesthesiology

## 2019-02-03 ENCOUNTER — Inpatient Hospital Stay (HOSPITAL_COMMUNITY): Payer: Medicare Other

## 2019-02-03 ENCOUNTER — Encounter (HOSPITAL_COMMUNITY)
Admission: EM | Disposition: A | Discharge status: Home / Self Care | Payer: Self-pay | Source: Home / Self Care | Attending: Vascular Surgery

## 2019-02-03 DIAGNOSIS — I714 Abdominal aortic aneurysm, without rupture: Secondary | ICD-10-CM

## 2019-02-03 HISTORY — PX: ABDOMINAL AORTIC ENDOVASCULAR STENT GRAFT: SHX5707

## 2019-02-03 HISTORY — PX: ULTRASOUND GUIDANCE FOR VASCULAR ACCESS: SHX6516

## 2019-02-03 LAB — CBC WITH DIFFERENTIAL/PLATELET
Abs Immature Granulocytes: 0.17 10*3/uL — ABNORMAL HIGH (ref 0.00–0.07)
Basophils Absolute: 0 10*3/uL (ref 0.0–0.1)
Basophils Relative: 0 %
Eosinophils Absolute: 0.1 10*3/uL (ref 0.0–0.5)
Eosinophils Relative: 1 %
HCT: 29.2 % — ABNORMAL LOW (ref 39.0–52.0)
Hemoglobin: 9.1 g/dL — ABNORMAL LOW (ref 13.0–17.0)
Immature Granulocytes: 2 %
Lymphocytes Relative: 10 %
Lymphs Abs: 0.8 10*3/uL (ref 0.7–4.0)
MCH: 28.6 pg (ref 26.0–34.0)
MCHC: 31.2 g/dL (ref 30.0–36.0)
MCV: 91.8 fL (ref 80.0–100.0)
Monocytes Absolute: 0.4 10*3/uL (ref 0.1–1.0)
Monocytes Relative: 5 %
Neutro Abs: 5.9 10*3/uL (ref 1.7–7.7)
Neutrophils Relative %: 82 %
Platelets: 154 10*3/uL (ref 150–400)
RBC: 3.18 MIL/uL — ABNORMAL LOW (ref 4.22–5.81)
RDW: 14 % (ref 11.5–15.5)
WBC: 7.3 10*3/uL (ref 4.0–10.5)
nRBC: 0 % (ref 0.0–0.2)

## 2019-02-03 LAB — POCT ACTIVATED CLOTTING TIME: Activated Clotting Time: 197 seconds

## 2019-02-03 LAB — PREPARE RBC (CROSSMATCH)

## 2019-02-03 SURGERY — INSERTION, ENDOVASCULAR STENT GRAFT, AORTA, ABDOMINAL
Anesthesia: General

## 2019-02-03 MED ORDER — PROPOFOL 10 MG/ML IV BOLUS
INTRAVENOUS | Status: DC | PRN
  Administered 2019-02-03: 110 mg via INTRAVENOUS

## 2019-02-03 MED ORDER — FENTANYL CITRATE (PF) 100 MCG/2ML IJ SOLN: 25.0000 ug | INTRAMUSCULAR | Status: DC | PRN

## 2019-02-03 MED ORDER — SUGAMMADEX SODIUM 200 MG/2ML IV SOLN
INTRAVENOUS | Status: DC | PRN
  Administered 2019-02-03: 129.8 mg via INTRAVENOUS

## 2019-02-03 MED ORDER — IODIXANOL 320 MG/ML IV SOLN
INTRAVENOUS | Status: DC | PRN
  Administered 2019-02-03: 103.6 mL

## 2019-02-03 MED ORDER — SODIUM CHLORIDE 0.9 % IV SOLN: 500.0000 mL | Freq: Once | INTRAVENOUS | Status: DC | PRN

## 2019-02-03 MED ORDER — SUCCINYLCHOLINE CHLORIDE 200 MG/10ML IV SOSY
PREFILLED_SYRINGE | INTRAVENOUS | Status: AC
  Filled 2019-02-03: qty 10

## 2019-02-03 MED ORDER — EPHEDRINE SULFATE-NACL 50-0.9 MG/10ML-% IV SOSY
PREFILLED_SYRINGE | INTRAVENOUS | Status: DC | PRN
  Administered 2019-02-03 (×3): 10 mg via INTRAVENOUS
  Administered 2019-02-03: 5 mg via INTRAVENOUS

## 2019-02-03 MED ORDER — DEXAMETHASONE SODIUM PHOSPHATE 10 MG/ML IJ SOLN
INTRAMUSCULAR | Status: AC
  Filled 2019-02-03: qty 1

## 2019-02-03 MED ORDER — MIDAZOLAM HCL 2 MG/2ML IJ SOLN
INTRAMUSCULAR | Status: AC
  Filled 2019-02-03: qty 2

## 2019-02-03 MED ORDER — SODIUM CHLORIDE 0.9 % IV SOLN
INTRAVENOUS | Status: DC | PRN
  Administered 2019-02-03: 500 mL

## 2019-02-03 MED ORDER — LIDOCAINE 2% (20 MG/ML) 5 ML SYRINGE
INTRAMUSCULAR | Status: DC | PRN
  Administered 2019-02-03: 60 mg via INTRAVENOUS

## 2019-02-03 MED ORDER — SODIUM CHLORIDE 0.9 % IV SOLN
INTRAVENOUS | Status: DC | PRN
  Administered 2019-02-03: 25 ug/min via INTRAVENOUS

## 2019-02-03 MED ORDER — LIDOCAINE 2% (20 MG/ML) 5 ML SYRINGE
INTRAMUSCULAR | Status: AC
  Filled 2019-02-03: qty 5

## 2019-02-03 MED ORDER — ASPIRIN 81 MG PO CHEW
81.0000 mg | CHEWABLE_TABLET | Freq: Every day | ORAL | Status: DC
  Administered 2019-02-04 – 2019-02-05 (×2): 81 mg via ORAL
  Filled 2019-02-03 (×2): qty 1

## 2019-02-03 MED ORDER — BISACODYL 5 MG PO TBEC: 5.0000 mg | DELAYED_RELEASE_TABLET | Freq: Every day | ORAL | Status: DC | PRN

## 2019-02-03 MED ORDER — CEFAZOLIN SODIUM 1 G IJ SOLR
INTRAMUSCULAR | Status: AC
  Filled 2019-02-03: qty 20

## 2019-02-03 MED ORDER — ACETAMINOPHEN 10 MG/ML IV SOLN: 1000.0000 mg | Freq: Once | INTRAVENOUS | Status: DC | PRN

## 2019-02-03 MED ORDER — DIGOXIN 125 MCG PO TABS
0.1250 mg | ORAL_TABLET | Freq: Every day | ORAL | Status: DC
  Administered 2019-02-03 – 2019-02-05 (×3): 0.125 mg via ORAL
  Filled 2019-02-03 (×3): qty 1

## 2019-02-03 MED ORDER — EPHEDRINE 5 MG/ML INJ
INTRAVENOUS | Status: AC
  Filled 2019-02-03: qty 10

## 2019-02-03 MED ORDER — SUCCINYLCHOLINE CHLORIDE 200 MG/10ML IV SOSY
PREFILLED_SYRINGE | INTRAVENOUS | Status: DC | PRN
  Administered 2019-02-03: 100 mg via INTRAVENOUS

## 2019-02-03 MED ORDER — AMIODARONE HCL 200 MG PO TABS
200.0000 mg | ORAL_TABLET | Freq: Every day | ORAL | Status: DC
  Administered 2019-02-03 – 2019-02-05 (×3): 200 mg via ORAL
  Filled 2019-02-03 (×3): qty 1

## 2019-02-03 MED ORDER — DOCUSATE SODIUM 100 MG PO CAPS
100.0000 mg | ORAL_CAPSULE | Freq: Every day | ORAL | Status: DC
  Administered 2019-02-05: 10:00:00 100 mg via ORAL
  Filled 2019-02-03 (×2): qty 1

## 2019-02-03 MED ORDER — 0.9 % SODIUM CHLORIDE (POUR BTL) OPTIME
TOPICAL | Status: DC | PRN
  Administered 2019-02-03: 09:00:00 1000 mL

## 2019-02-03 MED ORDER — OXYCODONE HCL 5 MG PO TABS: 5.0000 mg | ORAL_TABLET | Freq: Once | ORAL | Status: DC | PRN

## 2019-02-03 MED ORDER — ACETAMINOPHEN 500 MG PO TABS: 1000.0000 mg | ORAL_TABLET | Freq: Once | ORAL | Status: DC | PRN

## 2019-02-03 MED ORDER — ONDANSETRON HCL 4 MG/2ML IJ SOLN
INTRAMUSCULAR | Status: DC | PRN
  Administered 2019-02-03: 4 mg via INTRAVENOUS

## 2019-02-03 MED ORDER — HYDRALAZINE HCL 25 MG PO TABS
25.0000 mg | ORAL_TABLET | Freq: Three times a day (TID) | ORAL | Status: DC
  Filled 2019-02-03 (×4): qty 1

## 2019-02-03 MED ORDER — LABETALOL HCL 5 MG/ML IV SOLN: 10.0000 mg | INTRAVENOUS | Status: DC | PRN

## 2019-02-03 MED ORDER — HEMOSTATIC AGENTS (NO CHARGE) OPTIME
TOPICAL | Status: DC | PRN
  Administered 2019-02-03: 1 via TOPICAL

## 2019-02-03 MED ORDER — MAGNESIUM SULFATE 2 GM/50ML IV SOLN: 2.0000 g | Freq: Every day | INTRAVENOUS | Status: DC | PRN

## 2019-02-03 MED ORDER — ALBUMIN HUMAN 5 % IV SOLN
INTRAVENOUS | Status: DC | PRN
  Administered 2019-02-03: 09:00:00 via INTRAVENOUS

## 2019-02-03 MED ORDER — FENTANYL CITRATE (PF) 250 MCG/5ML IJ SOLN
INTRAMUSCULAR | Status: DC | PRN
  Administered 2019-02-03 (×2): 100 ug via INTRAVENOUS

## 2019-02-03 MED ORDER — FUROSEMIDE 80 MG PO TABS
80.0000 mg | ORAL_TABLET | Freq: Every day | ORAL | Status: DC
  Administered 2019-02-03 – 2019-02-05 (×3): 80 mg via ORAL
  Filled 2019-02-03 (×3): qty 1

## 2019-02-03 MED ORDER — OXYCODONE HCL 5 MG/5ML PO SOLN: 5.0000 mg | Freq: Once | ORAL | Status: DC | PRN

## 2019-02-03 MED ORDER — ROCURONIUM BROMIDE 10 MG/ML (PF) SYRINGE
PREFILLED_SYRINGE | INTRAVENOUS | Status: DC | PRN
  Administered 2019-02-03: 40 mg via INTRAVENOUS
  Administered 2019-02-03: 50 mg via INTRAVENOUS

## 2019-02-03 MED ORDER — HEPARIN SODIUM (PORCINE) 1000 UNIT/ML IJ SOLN
INTRAMUSCULAR | Status: AC
  Filled 2019-02-03: qty 1

## 2019-02-03 MED ORDER — LACTATED RINGERS IV SOLN
INTRAVENOUS | Status: DC | PRN
  Administered 2019-02-03: 07:00:00 via INTRAVENOUS

## 2019-02-03 MED ORDER — PROTAMINE SULFATE 10 MG/ML IV SOLN
INTRAVENOUS | Status: AC
  Filled 2019-02-03: qty 5

## 2019-02-03 MED ORDER — SENNOSIDES-DOCUSATE SODIUM 8.6-50 MG PO TABS: 1.0000 | ORAL_TABLET | Freq: Every evening | ORAL | Status: DC | PRN

## 2019-02-03 MED ORDER — FLEET ENEMA 7-19 GM/118ML RE ENEM: 1.0000 | ENEMA | Freq: Once | RECTAL | Status: DC | PRN

## 2019-02-03 MED ORDER — OXYCODONE-ACETAMINOPHEN 5-325 MG PO TABS
1.0000 | ORAL_TABLET | ORAL | Status: DC | PRN
  Administered 2019-02-04: 2 via ORAL
  Filled 2019-02-03: qty 2

## 2019-02-03 MED ORDER — HEPARIN SODIUM (PORCINE) 1000 UNIT/ML IJ SOLN
INTRAMUSCULAR | Status: DC | PRN
  Administered 2019-02-03: 6000 [IU] via INTRAVENOUS
  Administered 2019-02-03: 4000 [IU] via INTRAVENOUS

## 2019-02-03 MED ORDER — MIDAZOLAM HCL 2 MG/2ML IJ SOLN
INTRAMUSCULAR | Status: DC | PRN
  Administered 2019-02-03: 2 mg via INTRAVENOUS

## 2019-02-03 MED ORDER — PHENYLEPHRINE HCL-NACL 10-0.9 MG/250ML-% IV SOLN
INTRAVENOUS | Status: AC
  Filled 2019-02-03: qty 500

## 2019-02-03 MED ORDER — FENTANYL CITRATE (PF) 250 MCG/5ML IJ SOLN
INTRAMUSCULAR | Status: AC
  Filled 2019-02-03: qty 5

## 2019-02-03 MED ORDER — HYDROMORPHONE HCL 1 MG/ML IJ SOLN: 0.5000 mg | INTRAMUSCULAR | Status: DC | PRN

## 2019-02-03 MED ORDER — SPIRONOLACTONE 12.5 MG HALF TABLET
12.5000 mg | ORAL_TABLET | Freq: Every day | ORAL | Status: DC
  Administered 2019-02-04 – 2019-02-05 (×2): 12.5 mg via ORAL
  Filled 2019-02-03 (×3): qty 1

## 2019-02-03 MED ORDER — ISOSORBIDE MONONITRATE ER 30 MG PO TB24
30.0000 mg | ORAL_TABLET | Freq: Every day | ORAL | Status: DC
  Administered 2019-02-03 – 2019-02-04 (×2): 30 mg via ORAL
  Filled 2019-02-03 (×3): qty 1

## 2019-02-03 MED ORDER — ASPIRIN EC 81 MG PO TBEC: 81.0000 mg | DELAYED_RELEASE_TABLET | Freq: Every day | ORAL | Status: DC

## 2019-02-03 MED ORDER — CEFAZOLIN SODIUM-DEXTROSE 2-3 GM-%(50ML) IV SOLR
INTRAVENOUS | Status: DC | PRN
  Administered 2019-02-03: 2 g via INTRAVENOUS

## 2019-02-03 MED ORDER — HYDROMORPHONE HCL 1 MG/ML IJ SOLN: 0.2500 mg | INTRAMUSCULAR | Status: DC | PRN

## 2019-02-03 MED ORDER — PROTAMINE SULFATE 10 MG/ML IV SOLN
INTRAVENOUS | Status: DC | PRN
  Administered 2019-02-03: 50 mg via INTRAVENOUS

## 2019-02-03 MED ORDER — SODIUM CHLORIDE 0.9 % IV SOLN
INTRAVENOUS | Status: AC
  Filled 2019-02-03: qty 1.2

## 2019-02-03 MED ORDER — ACETAMINOPHEN 160 MG/5ML PO SOLN: 1000.0000 mg | Freq: Once | ORAL | Status: DC | PRN

## 2019-02-03 MED ORDER — ALPRAZOLAM 0.25 MG PO TABS
0.2500 mg | ORAL_TABLET | Freq: Every evening | ORAL | Status: DC | PRN
  Administered 2019-02-03 – 2019-02-04 (×2): 0.25 mg via ORAL
  Filled 2019-02-03 (×2): qty 1

## 2019-02-03 MED ORDER — POTASSIUM CHLORIDE CRYS ER 20 MEQ PO TBCR: 20.0000 meq | EXTENDED_RELEASE_TABLET | Freq: Every day | ORAL | Status: DC | PRN

## 2019-02-03 MED ORDER — PANTOPRAZOLE SODIUM 40 MG PO TBEC
40.0000 mg | DELAYED_RELEASE_TABLET | Freq: Every day | ORAL | Status: DC
  Administered 2019-02-03 – 2019-02-05 (×3): 40 mg via ORAL
  Filled 2019-02-03 (×3): qty 1

## 2019-02-03 MED ORDER — SODIUM CHLORIDE 0.9 % IV SOLN
INTRAVENOUS | Status: DC
  Administered 2019-02-03 (×2): via INTRAVENOUS

## 2019-02-03 MED ORDER — ONDANSETRON HCL 4 MG/2ML IJ SOLN
INTRAMUSCULAR | Status: AC
  Filled 2019-02-03: qty 2

## 2019-02-03 MED ORDER — DEXAMETHASONE SODIUM PHOSPHATE 10 MG/ML IJ SOLN
INTRAMUSCULAR | Status: DC | PRN
  Administered 2019-02-03: 5 mg via INTRAVENOUS

## 2019-02-03 MED ORDER — ROCURONIUM BROMIDE 10 MG/ML (PF) SYRINGE
PREFILLED_SYRINGE | INTRAVENOUS | Status: AC
  Filled 2019-02-03: qty 10

## 2019-02-03 MED ORDER — PROPOFOL 10 MG/ML IV BOLUS
INTRAVENOUS | Status: AC
  Filled 2019-02-03: qty 20

## 2019-02-03 SURGICAL SUPPLY — 59 items
BLADE CLIPPER SURG (BLADE) ×3 IMPLANT
CANISTER SUCT 3000ML PPV (MISCELLANEOUS) ×3 IMPLANT
CATH BEACON 5.038 65CM KMP-01 (CATHETERS) ×3 IMPLANT
CATH OMNI FLUSH .035X70CM (CATHETERS) ×3 IMPLANT
COVER MAYO STAND STRL (DRAPES) ×3 IMPLANT
COVER WAND RF STERILE (DRAPES) IMPLANT
DERMABOND ADVANCED (GAUZE/BANDAGES/DRESSINGS) ×2
DERMABOND ADVANCED .7 DNX12 (GAUZE/BANDAGES/DRESSINGS) ×4 IMPLANT
DEVICE CLOSURE PERCLS PRGLD 6F (VASCULAR PRODUCTS) ×8 IMPLANT
DEVICE TORQUE KENDALL .025-038 (MISCELLANEOUS) IMPLANT
DRSG TEGADERM 2-3/8X2-3/4 SM (GAUZE/BANDAGES/DRESSINGS) IMPLANT
DRYSEAL FLEXSHEATH 12FR 33CM (SHEATH) ×1
DRYSEAL FLEXSHEATH 18FR 33CM (SHEATH) ×1
ELECT REM PT RETURN 9FT ADLT (ELECTROSURGICAL) ×6
ELECTRODE REM PT RTRN 9FT ADLT (ELECTROSURGICAL) ×4 IMPLANT
EXCLUDER TNK LEG 35MX14X14 (Endovascular Graft) ×2 IMPLANT
EXCLUDER TRUNK LEG 35MX14X14 (Endovascular Graft) ×3 IMPLANT
GAUZE SPONGE 2X2 8PLY STRL LF (GAUZE/BANDAGES/DRESSINGS) IMPLANT
GLOVE BIO SURGEON STRL SZ7.5 (GLOVE) ×6 IMPLANT
GOWN STRL REUS W/ TWL LRG LVL3 (GOWN DISPOSABLE) ×4 IMPLANT
GOWN STRL REUS W/ TWL XL LVL3 (GOWN DISPOSABLE) ×4 IMPLANT
GOWN STRL REUS W/TWL LRG LVL3 (GOWN DISPOSABLE) ×2
GOWN STRL REUS W/TWL XL LVL3 (GOWN DISPOSABLE) ×2
GRAFT BALLN CATH 65CM (STENTS) ×2 IMPLANT
GUIDEWIRE ANGLED .035X150CM (WIRE) IMPLANT
HEMOSTAT SNOW SURGICEL 2X4 (HEMOSTASIS) ×3 IMPLANT
KIT BASIN OR (CUSTOM PROCEDURE TRAY) ×3 IMPLANT
KIT TURNOVER KIT B (KITS) ×3 IMPLANT
LEG CONTRALATERAL 16X16X9.5 (Endovascular Graft) ×3 IMPLANT
LEG CONTRALATERAL 16X20X13.5 (Vascular Products) ×1 IMPLANT
LEG CONTRALATERAL 20X11.5 (Vascular Products) ×1 IMPLANT
NEEDLE PERC 18GX7CM (NEEDLE) IMPLANT
NS IRRIG 1000ML POUR BTL (IV SOLUTION) ×3 IMPLANT
PACK ENDOVASCULAR (PACKS) ×3 IMPLANT
PAD ARMBOARD 7.5X6 YLW CONV (MISCELLANEOUS) ×6 IMPLANT
PERCLOSE PROGLIDE 6F (VASCULAR PRODUCTS) ×12
SET MICROPUNCTURE 5F STIFF (MISCELLANEOUS) ×3 IMPLANT
SHEATH BRITE TIP 8FR 23CM (SHEATH) ×3 IMPLANT
SHEATH DRYSEAL FLEX 12FR 33CM (SHEATH) ×2 IMPLANT
SHEATH DRYSEAL FLEX 18FR 33CM (SHEATH) ×2 IMPLANT
SHEATH PINNACLE 8F 10CM (SHEATH) ×3 IMPLANT
SPONGE GAUZE 2X2 STER 10/PKG (GAUZE/BANDAGES/DRESSINGS)
STENT GRAFT BALLN CATH 65CM (STENTS) ×1
STENT GRAFT CONTRALAT 20X11.5 (Vascular Products) ×2 IMPLANT
STENT GRAFT CONTRALAT 20X13.5 (Vascular Products) ×2 IMPLANT
STOPCOCK MORSE 400PSI 3WAY (MISCELLANEOUS) ×3 IMPLANT
SUT MNCRL AB 4-0 PS2 18 (SUTURE) ×3 IMPLANT
SUT PROLENE 5 0 C 1 24 (SUTURE) ×9 IMPLANT
SUT VIC AB 2-0 CT1 27 (SUTURE) ×1
SUT VIC AB 2-0 CT1 TAPERPNT 27 (SUTURE) ×2 IMPLANT
SUT VIC AB 3-0 SH 27 (SUTURE) ×1
SUT VIC AB 3-0 SH 27X BRD (SUTURE) ×2 IMPLANT
SYR 20ML LL LF (SYRINGE) IMPLANT
TOWEL GREEN STERILE (TOWEL DISPOSABLE) ×6 IMPLANT
TRAY FOLEY MTR SLVR 16FR STAT (SET/KITS/TRAYS/PACK) ×3 IMPLANT
TUBING HIGH PRESSURE 120CM (CONNECTOR) ×3 IMPLANT
WIRE AMPLATZ SS-J .035X180CM (WIRE) ×6 IMPLANT
WIRE BENTSON .035X145CM (WIRE) ×6 IMPLANT
WIRE STIFF LUNDERQUIST 260CM (WIRE) ×3 IMPLANT

## 2019-02-03 NOTE — Progress Notes (Signed)
  Progress Note    02/03/2019 7:25 AM Day of Surgery  Subjective:  No overnight issues  Vitals:   02/02/19 2245 02/03/19 0509  BP: 115/71 115/72  Pulse: (!) 57 (!) 52  Resp:    Temp: (!) 97.4 F (36.3 C) 97.9 F (36.6 C)  SpO2: 99% 98%    Physical Exam: aaox3 Non labored respirations Abdomen is soft, large palpable aaa Bilateral common femoral and popliteal pulses are palpable  CBC    Component Value Date/Time   WBC 9.0 01/29/2019 0242   RBC 3.92 (L) 01/29/2019 0242   HGB 11.5 (L) 01/29/2019 0242   HCT 34.3 (L) 01/29/2019 0242   PLT 267 01/29/2019 0242   MCV 87.5 01/29/2019 0242   MCH 29.3 01/29/2019 0242   MCHC 33.5 01/29/2019 0242   RDW 13.9 01/29/2019 0242   LYMPHSABS 1.2 01/14/2019 1045   MONOABS 0.7 01/14/2019 1045   EOSABS 0.2 01/14/2019 1045   BASOSABS 0.0 01/14/2019 1045    BMET    Component Value Date/Time   NA 140 02/02/2019 1128   K 4.0 02/02/2019 1128   CL 103 02/02/2019 1128   CO2 27 02/02/2019 1128   GLUCOSE 128 (H) 02/02/2019 1128   BUN 55 (H) 02/02/2019 1128   CREATININE 1.83 (H) 02/02/2019 1128   CALCIUM 8.9 02/02/2019 1128   GFRNONAA 38 (L) 02/02/2019 1128   GFRAA 44 (L) 02/02/2019 1128    INR    Component Value Date/Time   INR 1.1 01/28/2019 1841     Intake/Output Summary (Last 24 hours) at 02/03/2019 0725 Last data filed at 02/02/2019 1600 Gross per 24 hour  Intake 32.33 ml  Output -  Net 32.33 ml     Assessment:  68 y.o. male is here with symptomatic 10cm AAA  Plan: OR today for EVAR with possible endoanchors and possible snorkel of left renal artery   Brandon C. Donzetta Matters, MD [facility] [place] Office: [phone] Pager: [phone]  02/03/2019 7:25 AM

## 2019-02-03 NOTE — Transfer of Care (Signed)
Immediate Anesthesia Transfer of Care Note  Patient: John Hicks  Procedure(s) Performed: ABDOMINAL AORTIC ENDOVASCULAR STENT GRAFT (N/A ) Ultrasound Guidance For Vascular Access  Patient Location: PACU  Anesthesia Type:General  Level of Consciousness: drowsy and patient cooperative  Airway & Oxygen Therapy: Patient Spontanous Breathing and Patient connected to face mask oxygen  Post-op Assessment: Report given to RN and Post -op Vital signs reviewed and stable  Post vital signs: Reviewed and stable  Last Vitals:  Vitals Value Taken Time  BP 117/72 02/03/19 1007  Temp    Pulse 83 02/03/19 1015  Resp 16 02/03/19 1015  SpO2 100 % 02/03/19 1015  Vitals shown include unvalidated device data.  Last Pain:  Vitals:   02/03/19 0509  TempSrc: Oral  PainSc:       Patients Stated Pain Goal: 0 (47/30/85 6943)  Complications: No apparent anesthesia complications

## 2019-02-03 NOTE — Anesthesia Procedure Notes (Signed)
Procedure Name: Intubation Date/Time: 02/03/2019 7:55 AM Performed by: Kathryne Hitch, CRNA Pre-anesthesia Checklist: Patient identified, Emergency Drugs available, Suction available and Patient being monitored Patient Re-evaluated:Patient Re-evaluated prior to induction Oxygen Delivery Method: Circle system utilized Preoxygenation: Pre-oxygenation with 100% oxygen Induction Type: IV induction Ventilation: Mask ventilation without difficulty Laryngoscope Size: Miller and 2 Grade View: Grade I Tube type: Oral Tube size: 7.5 mm Number of attempts: 1 Airway Equipment and Method: Stylet and Oral airway Placement Confirmation: ETT inserted through vocal cords under direct vision,  positive ETCO2 and breath sounds checked- equal and bilateral Secured at: 23 cm Tube secured with: Tape Dental Injury: Teeth and Oropharynx as per pre-operative assessment

## 2019-02-03 NOTE — Anesthesia Procedure Notes (Signed)
Arterial Line Insertion Start/End9/14/2020 7:56 AM, 02/03/2019 7:56 AM Performed by: Alain Marion, CRNA, CRNA  Patient location: Pre-op. Preanesthetic checklist: patient identified, IV checked, site marked, risks and benefits discussed, surgical consent, monitors and equipment checked and pre-op evaluation Lidocaine 1% used for infiltration Right, radial was placed Catheter size: 20 G Maximum sterile barriers used   Attempts: 2 Procedure performed without using ultrasound guided technique. Following insertion, dressing applied and Biopatch. Post procedure assessment: normal  Patient tolerated the procedure well with no immediate complications.

## 2019-02-03 NOTE — Op Note (Signed)
Patient name: John Hicks MRN: [id number] DOB: 21-Mar-1951 Sex: male  02/03/2019 Pre-operative Diagnosis: AAA Post-operative diagnosis:  Same Surgeon:  Erlene Quan C. Donzetta Matters, MD Assistant: Laurence Slate, PA Procedure Performed: 1.  Percutaneous access and closure of left common femoral artery 2.  Open femoral exposure right common femoral artery and closure 3.  Aortobiiliac endograft doing with main body right 35 x 14 x 14 extended with 20 x 13.5.  Contralateral left 16 x 9.5 cm extension with 20 x 11.5 distal.  Indications: 68 year old male with large abdominal aortic aneurysm initially was planned for open repair now has standard criteria for endovascular repair.  We discussed the risk benefits alternatives he is agreeable to proceed.  Findings: There is actually probably about a 10 to 12 mm neck with a reverse taper.  The iliacs were heavily diseased as were the right common femoral artery.  This was exposed from an open standpoint.  The left was percutaneously accessed and closed.  At completion there were no type I or III endoleak's.  There were type II was noted from 4 lumbar arteries and from an IMA.  There were palpable pulses at the ankle.   Procedure:  The patient was identified in the holding area and taken to the operating room was placed supine operative general anesthesia induced.  He sterilely prepped draped in the abdomen the left neck and chest bilateral groins in the usual fashion.  Antibiotics were administered.  Timeout was called.  I began with transverse incision over the palpable common femoral pulse.  Dissected down placed Vesseloops around the common femoral artery.  I then used ultrasound to percutaneously cannulate the left common femoral artery with micropuncture needle followed the wire and sheath.  I placed a Bentson wire.  I deployed to pro-glide devices placed an 8 [place] sheath.  On the right side cannulated micropuncture needle followed the wire and sheath.  I placed a  Bentson and placed a long 8 [place] sheath.  I placed a bare catheter into the aorta exchanged for stiff wires bilaterally.  Ultimately had to place a Lunderquist on the right.  An 22 French sheath was placed on the right and 12 on the left under fluoroscopic guidance.  Patient was fully heparinized at this time with 6000 heparin ACT was subtherapeutic I gave an additional 4000.  Main body was placed from the right side to the level of L1.  Omni catheter placed from the left we performed aortogram.  We then deployed reimaged and demonstrated that we were right at the level of the lowest renal artery.  We then cannulated our gate confirmed we were in the gate by spinning and Omni catheter.  We performed retrograde angiography.  We did need to extend down with 16 x 9.5 cm and then our contralateral limb was 20 x 11.5 cm device.  On the right side we performed retrograde angiography to identify our hypogastric artery.  We stented down with 20 x 13.5 cm limb.  These were all ballooned.  Completion angiography demonstrated no endoleak's other than the type II is noted above.  We were satisfied with the above findings.  We then percutaneously closed her left common femoral artery removed our sheath and wire.  On the right side we clamped and remove the sheath.  We did have to place 3 interrupted 5-0 Prolene sutures to close the artery.  Patient had good signals at both feet we administered 50 mg of protamine.  He is awake and  anesthesia having tolerated procedure without any complication.  All counts were correct at completion.   Contrast: 103cc  Glynda Soliday C. Donzetta Matters, MD [facility] [place] Office: [phone] Pager: [phone]

## 2019-02-03 NOTE — Anesthesia Postprocedure Evaluation (Signed)
Anesthesia Post Note  Patient: John Hicks  Procedure(s) Performed: ABDOMINAL AORTIC ENDOVASCULAR STENT GRAFT (N/A ) Ultrasound Guidance For Vascular Access     Patient location during evaluation: PACU Anesthesia Type: General Level of consciousness: awake and alert Pain management: pain level controlled Vital Signs Assessment: post-procedure vital signs reviewed and stable Respiratory status: spontaneous breathing, nonlabored ventilation, respiratory function stable and patient connected to nasal cannula oxygen Cardiovascular status: blood pressure returned to baseline and stable Postop Assessment: no apparent nausea or vomiting Anesthetic complications: no    Last Vitals:  Vitals:   02/03/19 1200 02/03/19 1215  BP: 114/75   Pulse: 67 62  Resp: 16 19  Temp:    SpO2: 100% 100%    Last Pain:  Vitals:   02/03/19 1200  TempSrc:   PainSc: 0-No pain                 Vicke Plotner S

## 2019-02-03 NOTE — Progress Notes (Signed)
    Bilateral groins soft without hematoma Active range of motion of B LE , feet are warm and well perfused  S/P EVAR Stable disposition post op   Roxy Horseman PA-C

## 2019-02-03 NOTE — Progress Notes (Signed)
New admission to room 4E22. Pt in bed, beds switched and pt remained in same bed. Neuro intact, NSR, box 22 verified with CCMD, John Hicks NT with second verifier. A-line in right wrist with LR connected to 18 gauge left wrist. LR d/c and NS started per MD order. 20 gauge to left FA. Pt given CHG bath. Bilateral groin sites with skin glue intact. No drainage noted. Pt c/o 0 pain. Pt states belongings still in other room 6E04. Unit notified of pt being in 4E22 and belongings brought to room by unit sec. Pt with f/c in place see flowsheet for output. Will continue to monitor.

## 2019-02-03 NOTE — Anesthesia Preprocedure Evaluation (Signed)
Anesthesia Evaluation  Patient identified by MRN, date of birth, ID band Patient awake    Reviewed: Allergy & Precautions, H&P , NPO status , Patient's Chart, lab work & pertinent test results  Airway Mallampati: II   Neck ROM: full    Dental   Pulmonary shortness of breath, former smoker,    breath sounds clear to auscultation       Cardiovascular hypertension, + Peripheral Vascular Disease and +CHF  + dysrhythmias Atrial Fibrillation  Rhythm:regular Rate:Normal  AAA   Neuro/Psych    GI/Hepatic   Endo/Other    Renal/GU Renal InsufficiencyRenal disease     Musculoskeletal   Abdominal   Peds  Hematology  (+) anemia ,   Anesthesia Other Findings   Reproductive/Obstetrics                             Anesthesia Physical Anesthesia Plan  ASA: III  Anesthesia Plan: General   Post-op Pain Management:    Induction: Intravenous  PONV Risk Score and Plan: 2 and Ondansetron, Dexamethasone, Midazolam and Treatment may vary due to age or medical condition  Airway Management Planned: Oral ETT  Additional Equipment: Arterial line  Intra-op Plan:   Post-operative Plan: Extubation in OR  Informed Consent: I have reviewed the patients History and Physical, chart, labs and discussed the procedure including the risks, benefits and alternatives for the proposed anesthesia with the patient or authorized representative who has indicated his/her understanding and acceptance.       Plan Discussed with: CRNA, Anesthesiologist and Surgeon  Anesthesia Plan Comments:         Anesthesia Quick Evaluation

## 2019-02-04 ENCOUNTER — Encounter (HOSPITAL_COMMUNITY): Payer: Self-pay | Admitting: Vascular Surgery

## 2019-02-04 ENCOUNTER — Encounter (HOSPITAL_COMMUNITY): Admission: RE | Payer: Self-pay | Source: Ambulatory Visit

## 2019-02-04 ENCOUNTER — Inpatient Hospital Stay (HOSPITAL_COMMUNITY)
Admission: RE | Admit: 2019-02-04 | Payer: No Typology Code available for payment source | Source: Ambulatory Visit | Admitting: Vascular Surgery

## 2019-02-04 ENCOUNTER — Other Ambulatory Visit: Payer: Self-pay | Admitting: Vascular Surgery

## 2019-02-04 DIAGNOSIS — I714 Abdominal aortic aneurysm, without rupture, unspecified: Secondary | ICD-10-CM

## 2019-02-04 LAB — BASIC METABOLIC PANEL
Anion gap: 9 (ref 5–15)
BUN: 43 mg/dL — ABNORMAL HIGH (ref 8–23)
CO2: 26 mmol/L (ref 22–32)
Calcium: 8.5 mg/dL — ABNORMAL LOW (ref 8.9–10.3)
Chloride: 104 mmol/L (ref 98–111)
Creatinine, Ser: 1.49 mg/dL — ABNORMAL HIGH (ref 0.61–1.24)
GFR calc Af Amer: 56 mL/min — ABNORMAL LOW (ref >60)
GFR calc non Af Amer: 48 mL/min — ABNORMAL LOW (ref >60)
Glucose, Bld: 137 mg/dL — ABNORMAL HIGH (ref 70–99)
Potassium: 4.8 mmol/L (ref 3.5–5.1)
Sodium: 139 mmol/L (ref 135–145)

## 2019-02-04 LAB — CBC
HCT: 24.1 % — ABNORMAL LOW (ref 39.0–52.0)
Hemoglobin: 8.1 g/dL — ABNORMAL LOW (ref 13.0–17.0)
MCH: 30.5 pg (ref 26.0–34.0)
MCHC: 33.6 g/dL (ref 30.0–36.0)
MCV: 90.6 fL (ref 80.0–100.0)
Platelets: 154 10*3/uL (ref 150–400)
RBC: 2.66 MIL/uL — ABNORMAL LOW (ref 4.22–5.81)
RDW: 14.1 % (ref 11.5–15.5)
WBC: 10.4 10*3/uL (ref 4.0–10.5)
nRBC: 0 % (ref 0.0–0.2)

## 2019-02-04 SURGERY — ANEURYSM ABDOMINAL AORTIC REPAIR
Anesthesia: General

## 2019-02-04 MED ORDER — ATORVASTATIN CALCIUM 40 MG PO TABS: 40.0000 mg | ORAL_TABLET | Freq: Every day | ORAL | 3 refills | Status: DC

## 2019-02-04 MED ORDER — OXYCODONE-ACETAMINOPHEN 5-325 MG PO TABS: 1.0000 | ORAL_TABLET | Freq: Four times a day (QID) | ORAL | 0 refills | Status: DC | PRN

## 2019-02-04 MED ORDER — ENSURE ENLIVE PO LIQD
237.0000 mL | Freq: Three times a day (TID) | ORAL | Status: DC
  Administered 2019-02-04 (×2): 237 mL via ORAL

## 2019-02-04 NOTE — Discharge Instructions (Signed)
   [facility] [place]   Discharge Instructions  Endovascular Aortic Aneurysm Repair  Please refer to the following instructions for your post-procedure care. Your surgeon or Physician Assistant will discuss any changes with you.  Activity  You are encouraged to walk as much as you can. You can slowly return to normal activities but must avoid strenuous activity and heavy lifting until your doctor tells you it's OK. Avoid activities such as vacuuming or swinging a gold club. It is normal to feel tired for several weeks after your surgery. Do not drive until your doctor gives the OK and you are no longer taking prescription pain medications. It is also normal to have difficulty with sleep habits, eating, and bowel movements after surgery. These will go away with time.  Bathing/Showering  You may shower after you go home. If you have an incision, do not soak in a bathtub, hot tub, or swim until the incision heals completely.  Incision Care  Shower every day. Clean your incision with mild soap and water. Pat the area dry with a clean towel. You do not need a bandage unless otherwise instructed. Do not apply any ointments or creams to your incision. If you clothing is irritating, you may cover your incision with a dry gauze pad.  Diet  Resume your normal diet. There are no special food restrictions following this procedure. A low fat/low cholesterol diet is recommended for all patients with vascular disease. In order to heal from your surgery, it is CRITICAL to get adequate nutrition. Your body requires vitamins, minerals, and protein. Vegetables are the best source of vitamins and minerals. Vegetables also provide the perfect balance of protein. Processed food has little nutritional value, so try to avoid this.  Medications  Resume taking all of your medications unless your doctor or nurse practitioner tells you not to. If your incision is causing pain, you may take  over-the-counter pain relievers such as acetaminophen (Tylenol). If you were prescribed a stronger pain medication, please be aware these medications can cause nausea and constipation. Prevent nausea by taking the medication with a snack or meal. Avoid constipation by drinking plenty of fluids and eating foods with a high amount of fiber, such as fruits, vegetables, and grains. Do not take Tylenol if you are taking prescription pain medications.   Follow up  Our office will schedule a follow-up appointment with a C.T. scan 3-4 weeks after your surgery.  Please call us immediately for any of the following conditions  Severe or worsening pain in your legs or feet or in your abdomen back or chest. Increased pain, redness, drainage (pus) from your incision sit. Increased abdominal pain, bloating, nausea, vomiting or persistent diarrhea. Fever of 101 degrees or higher. Swelling in your leg (s),  Reduce your risk of vascular disease  Stop smoking. If you would like help call [facility] at 1-800-QUIT-NOW (1-800-784-8669) or [place] at 336-586-4000. Manage your cholesterol Maintain a desired weight Control your diabetes Keep your blood pressure down  If you have questions, please call the office at [phone].   

## 2019-02-04 NOTE — Progress Notes (Addendum)
     Subjective  - Doing much better.  Very pleased with surgical out come.   Objective 103/60 87 97.8 F (36.6 C) (Oral) 18 100%  Intake/Output Summary (Last 24 hours) at 02/04/2019 0736 Last data filed at 02/04/2019 0600 Gross per 24 hour  Intake 3775.4 ml  Output 2950 ml  Net 825.4 ml   B LE Warm with intact sensation and motor. Groins soft without hematoma Right groin incision  Lungs non labored breathing   Assessment/Planning: POD # 1 EVAR  Pending independent void plan for discharge home today. Plan to f/u in 4 weeks with repeat CTA Abdomin and pelvis  He will cont. Aspirin and Lipitor daily.  Roxy Horseman 02/04/2019 7:36 AM --  Laboratory Lab Results: Recent Labs    02/03/19 1104 02/04/19 0300  WBC 7.3 10.4  HGB 9.1* 8.1*  HCT 29.2* 24.1*  PLT 154 154   BMET Recent Labs    02/02/19 1128 02/04/19 0300  NA 140 139  K 4.0 4.8  CL 103 104  CO2 27 26  GLUCOSE 128* 137*  BUN 55* 43*  CREATININE 1.83* 1.49*  CALCIUM 8.9 8.5*    COAG Lab Results  Component Value Date   INR 1.1 01/28/2019   No results found for: PTT   I have independently interviewed and examined patient and agree with PA assessment and plan above.   Ardene Remley C. Donzetta Matters, MD [facility] [place] Office: [phone] Pager: [phone]

## 2019-02-04 NOTE — Progress Notes (Signed)
Follow up pastoral care from pre-surgical spiritual consult.  Pt feared he would not survive surgery. Pt. sitting up eating a large healthy breakfast.  Shades were up; spirits were up.  Celebration all around for this seemingly-miraculous outcome which many people in his life had been praying for.    Submitted in thanksgiving,   [place] Resident [phone]

## 2019-02-04 NOTE — Progress Notes (Signed)
Per pt, his friend will not be available to look after him until tomorrow 9/16.  This friend will be able to come get him from the hospital and stay with him starting tomorrow 9/16 am.  As per PA/MD, we will plan to DC pt 9/16 am when his friend is available.

## 2019-02-05 ENCOUNTER — Telehealth: Payer: Self-pay | Admitting: Physician Assistant

## 2019-02-05 DIAGNOSIS — N183 Chronic kidney disease, stage 3 (moderate): Secondary | ICD-10-CM

## 2019-02-05 DIAGNOSIS — I480 Paroxysmal atrial fibrillation: Secondary | ICD-10-CM

## 2019-02-05 DIAGNOSIS — I5032 Chronic diastolic (congestive) heart failure: Secondary | ICD-10-CM

## 2019-02-05 DIAGNOSIS — I712 Thoracic aortic aneurysm, without rupture: Secondary | ICD-10-CM

## 2019-02-05 DIAGNOSIS — I739 Peripheral vascular disease, unspecified: Secondary | ICD-10-CM

## 2019-02-05 LAB — TYPE AND SCREEN
ABO/RH(D): O POS
Antibody Screen: NEGATIVE
Unit division: 0
Unit division: 0

## 2019-02-05 LAB — BPAM RBC
Blood Product Expiration Date: 202010182359
Blood Product Expiration Date: 202010182359
ISSUE DATE / TIME: 202009140825
ISSUE DATE / TIME: 202009140825
Unit Type and Rh: 5100
Unit Type and Rh: 5100

## 2019-02-05 MED ORDER — FUROSEMIDE 20 MG PO TABS: 20.0000 mg | ORAL_TABLET | Freq: Two times a day (BID) | ORAL | 11 refills | Status: DC

## 2019-02-05 NOTE — Telephone Encounter (Signed)
New message   Per Vin scheduled a TOC with Vin Bhaghat on 02/12/19 at 1:30pm.

## 2019-02-05 NOTE — Telephone Encounter (Signed)
**Note De-Identified Vasiliki Smaldone Obfuscation** The pt is currently in the hospital. We will call once he has been discharged.

## 2019-02-05 NOTE — Progress Notes (Addendum)
[facility] [place]  Subjective  - No new complaints.  Ambulating halls independently.      Objective 101/61 60 98.3 F (36.8 C) (Oral) 11 98%  Intake/Output Summary (Last 24 hours) at 02/05/2019 0742 Last data filed at 02/05/2019 2707 Gross per 24 hour  Intake 840 ml  Output -  Net 840 ml    Groins soft with healing incisions Abdomin non tender to palpation Feet warm with intact motor and sensation  Assessment/Planning: POD # 2 EVAR  Plan to discharge home.    He has had hypotension and brady cardia in 50-60's asymptomatic.  RN has held Hydralazine for several doses.  I have called Cardiology for discharge medication orders.  Cont. Asa and  Lipitor daily,    Roxy Horseman 02/05/2019 7:42 AM --  Laboratory Lab Results: Recent Labs    02/03/19 1104 02/04/19 0300  WBC 7.3 10.4  HGB 9.1* 8.1*  HCT 29.2* 24.1*  PLT 154 154   BMET Recent Labs    02/02/19 1128 02/04/19 0300  NA 140 139  K 4.0 4.8  CL 103 104  CO2 27 26  GLUCOSE 128* 137*  BUN 55* 43*  CREATININE 1.83* 1.49*  CALCIUM 8.9 8.5*    COAG Lab Results  Component Value Date   INR 1.1 01/28/2019   No results found for: PTT   I have independently interviewed and examined patient and agree with PA assessment and plan above.   Brandon C. Donzetta Matters, MD [facility] [place] Office: [phone] Pager: [phone]

## 2019-02-05 NOTE — Progress Notes (Addendum)
John Hicks is a 68 y.o. male with a hx of 9.5 cm infrarenal AAA, HTN, S-D-CHF, aortic root aneurysm, CKD III-IV, ongoing tob use, PAF on amio (no anticoag 2nd compliance issues), seen initially for surgical clearance.   He was last seen by Dr. Haroldine Laws in 2018 when admitted with acute CHF with LVEF of 10-15%.   DASI gives him 5.62 METs activity level. Echo showed improved LVEF to >65% with impaired relaxation. He underwent successful EVAR. Post op blood pressure is soft and telemetry showed sinus bradycardia in 50s. Hydralazine has been held. Intermittently getting spironolactone. Renal function is improving. He is not on rate control agent prior to presentation 2nd to bradycardia.   He is mildly hypotensive and his hydralazine has been held now for several doses.  Despite the positive fluid balance during this admission, he does not have any evidence of edema or jugular venous distention.  Lungs are clear and he is able to lie flat in bed without difficulty.  [facility] will sign off.   Medication Recommendations:  Hold hydralazine and Imdur 2nd to soft blood pressure.  Continue Amiodarone 200mg  qd, ASA 81mg , Lipitor 40mg , Digoxin 0.125mg  and spironolactone 12.5mg  qd.  Reduce lasix to 40mg  qd at discharge.  May be able to reduce further as an outpatient.  Other recommendations (labs, testing, etc):  BMET in few weeks.  He is not sure that he would wish to undergo surgery for a thoracic aortic aneurysm, so routine imaging is not beneficial unless he becomes symptomatic. Follow up as an outpatient:  Will arrange follow (seen by Dr. Marlou Porch this admission). Long term care at [facility] due to geographic location.  Sanda Klein, MD, [facility] [phone] office [phone] pager

## 2019-02-06 ENCOUNTER — Other Ambulatory Visit: Payer: Self-pay | Admitting: *Deleted

## 2019-02-06 DIAGNOSIS — I714 Abdominal aortic aneurysm, without rupture, unspecified: Secondary | ICD-10-CM

## 2019-02-06 MED ORDER — ATORVASTATIN CALCIUM 40 MG PO TABS: 40.0000 mg | ORAL_TABLET | Freq: Every day | ORAL | 3 refills | Status: DC

## 2019-02-08 NOTE — Discharge Summary (Signed)
[facility] Discharge Summary   Patient ID:  John Hicks MRN: [id number] DOB/AGE: 11-09-1950 68 y.o.  Admit date: 01/28/2019 Discharge date: 02/05/2019 Date of Surgery: 02/03/2019 Surgeon: Surgeon(s): Waynetta Sandy, MD  Admission Diagnosis: AAA (abdominal aortic aneurysm) without rupture [facility]) [I71.4]  Discharge Diagnoses:  AAA (abdominal aortic aneurysm) without rupture [facility]) [I71.4]  Secondary Diagnoses: Past Medical History:  Diagnosis Date  . AAA (abdominal aortic aneurysm) ([place])   . AF (atrial fibrillation) ([place])   . CHF (congestive heart failure) ([place])   . Dyspnea   . Enlarged heart   . Hypertension     Procedure(s): ABDOMINAL AORTIC ENDOVASCULAR STENT GRAFT Ultrasound Guidance For Vascular Access  Discharged Condition: good  HPI: This is a 68 y.o. male with known abdominal aortic aneurysm.  He has been sent for cardiac clearance.  He called with symptomatic abdominal pain.  States the pain is really been present for at least 10 days.  Nothing makes it better or worse.  Nonradiating at this time.  Still walking without any limitations.  Is a chronic every day smoker.  Does have long disease with known congestive heart failure.  He has a known AAA with the largest transverse diameter being 10 cm.  He was originally scheduled for open repair and declined to get his affairs in order.  We asked Cardiology to examine and maximize his pre operative status for future repair.  EF drastically improved on echo.    DR. Donzetta Matters has now scheduled the patient for EVAR repair and the patient is in agreement.     Hospital Course:  John Hicks is a 68 y.o. male is S/P  Procedure(s): ABDOMINAL AORTIC ENDOVASCULAR STENT GRAFT Ultrasound Guidance For Vascular Access  Consults:  Treatment Team:  Waynetta Sandy, MD [facility], [place], MD  Post operatively he had an uneventful admission and was very pleased that the surgical outcome was  positive.  Right groin incisions was healing well, left stick site was healing well without hematoma's B.   Plan to f/u in 4 weeks with repeat CTA Abdomin and pelvis  He will cont. Aspirin and Lipitor daily.  Significant Diagnostic Studies: CBC Lab Results  Component Value Date   WBC 10.4 02/04/2019   HGB 8.1 (L) 02/04/2019   HCT 24.1 (L) 02/04/2019   MCV 90.6 02/04/2019   PLT 154 02/04/2019    BMET    Component Value Date/Time   NA 139 02/04/2019 0300   K 4.8 02/04/2019 0300   CL 104 02/04/2019 0300   CO2 26 02/04/2019 0300   GLUCOSE 137 (H) 02/04/2019 0300   BUN 43 (H) 02/04/2019 0300   CREATININE 1.49 (H) 02/04/2019 0300   CALCIUM 8.5 (L) 02/04/2019 0300   GFRNONAA 48 (L) 02/04/2019 0300   GFRAA 56 (L) 02/04/2019 0300   COAG Lab Results  Component Value Date   INR 1.1 01/28/2019     Disposition:  Discharge to :Home Discharge Instructions    Call MD for:  redness, tenderness, or signs of infection (pain, swelling, bleeding, redness, odor or green/yellow discharge around incision site)   Complete by: As directed    Call MD for:  redness, tenderness, or signs of infection (pain, swelling, bleeding, redness, odor or green/yellow discharge around incision site)   Complete by: As directed    Call MD for:  severe or increased pain, loss or decreased feeling  in affected limb(s)   Complete by: As directed    Call MD for:  severe  or increased pain, loss or decreased feeling  in affected limb(s)   Complete by: As directed    Call MD for:  temperature >100.5   Complete by: As directed    Call MD for:  temperature >100.5   Complete by: As directed    Resume previous diet   Complete by: As directed    Resume previous diet   Complete by: As directed      Allergies as of 02/05/2019      Reactions   Codeine Other (See Comments)   agitation      Medication List    STOP taking these medications   hydrALAZINE 25 MG tablet Commonly known as: APRESOLINE   isosorbide  mononitrate 30 MG 24 hr tablet Commonly known as: IMDUR     TAKE these medications   ALPRAZolam 0.25 MG tablet Commonly known as: XANAX Take 0.25 mg by mouth at bedtime as needed for anxiety.   amiodarone 200 MG tablet Commonly known as: PACERONE Take 200 mg by mouth daily.   aspirin 81 MG chewable tablet Chew 81 mg by mouth daily.   digoxin 0.125 MG tablet Commonly known as: LANOXIN Take 1 tablet (0.125 mg total) by mouth daily.   furosemide 20 MG tablet Commonly known as: Lasix Take 1 tablet (20 mg total) by mouth 2 (two) times daily. Cardiology will refill in the future What changed:   medication strength  how much to take  when to take this  additional instructions   oxyCODONE-acetaminophen 5-325 MG tablet Commonly known as: PERCOCET/ROXICET Take 1 tablet by mouth every 6 (six) hours as needed for moderate pain.   spironolactone 25 MG tablet Commonly known as: ALDACTONE Take 0.5 tablets (12.5 mg total) by mouth daily.      Verbal and written Discharge instructions given to the patient. Wound care per Discharge AVS Follow-up Information    Waynetta Sandy, MD Follow up in 4 week(s).   Specialties: Vascular Surgery, Cardiology Why: office Contact information: [address] [place] [address] [phone]        Leanor Kail, [place]. Go on 02/12/2019.   Specialty: Cardiology Why: @1 :30pm for follow up  Contact information: [address] [address] [phone]        Herminio Commons, MD. Go on 04/04/2019.   Specialty: Cardiology Why: @3pm  for cardiology follow up  Contact information: [place] [address] [phone]           Signed: Roxy Horseman 02/08/2019, 10:09 AM - For VQI Registry use --- Instructions: Press F2 to tab through selections.  Delete question if not applicable.   Post-op:  Time to Extubation: x[ ]  In OR, [ ]  < 12 hrs, [ ]  12-24 hrs, [ ]  >=24  hrs Vasopressors Req. Post-op: No MI: [x ] No, [ ]  Troponin only, [ ]  EKG or Clinical New Arrhythmia: No CHF: No ICU Stay: 0 days Transfusion: No  If yes, 0 units given  Complications: Resp failure: [x ] none, [ ]  Pneumonia, [ ]  Ventilator Chg in renal function: [x ] none, [ ]  Inc. Cr > 0.5, [ ]  Temp. Dialysis, [ ]  Permanent dialysis Leg ischemia: [x ] No, [ ]  Yes, no Surgery needed, [ ]  Yes, Surgery needed, [ ]  Amputation Bowel ischemia: [x ] No, [ ]  Medical Rx, [ ]  Surgical Rx Wound complication: [x ] No, [ ]  Superficial separation/infection, [ ]  Return to OR Return to OR: No  Return to OR for bleeding: No Stroke: [x ] None, [ ]  Minor, [ ]  Major  Discharge medications: Statin use:  Yes ASA use:  Yes Plavix use:  No  for medical reason   Beta blocker use:  No  for medical reason

## 2019-02-10 NOTE — Telephone Encounter (Signed)
**Note De-Identified John Hicks Obfuscation** Patient contacted regarding discharge from [facility] on 02/05/2019.  Patient understands to follow up with provider Robbie Lis, PA-c on 02/12/2019 at 1:30 at [facility] in [place].  The pt is refusing to come to this appt and for his cardiac test that is scheduled on 10/21. After much encouragement and explaining his heart conditions to him he still does not want to keep this hospital/cardiac f/u. He states he is aware of his conditions and have been for some time. He states that he is going away for the whole month of October to "get away from things for awhile and to let it all sink in". He reports that he is having a lot of anxiety and just needs to get away.  He states that he plans to keep his f/u with Dr Donzetta Matters on 10/30 and will then call us to re-schedule both of these appts. I have canceled both his f/u appt and his Cardiac CT.   Patient understands discharge instructions? Yes Patient understands medications and regiment? Yes Patient understands to bring all medications to this visit? Yes

## 2019-02-12 ENCOUNTER — Ambulatory Visit: Payer: Self-pay | Admitting: Physician Assistant

## 2019-02-12 ENCOUNTER — Telehealth: Payer: Self-pay

## 2019-02-12 NOTE — Telephone Encounter (Signed)
Pt called and said that Dr Donzetta Matters did surgery on him recently. He said that he is having some swelling and the glue is coming off.   Called pt and discussed the swelling - he said that it is mild but the papers from discharge told him to let someone know if he noticed any. He said that he notices it being worse when he has been driving in the car and the seatbelt goes over the area and his feet are down for a period of time. He denies any fever, any drainage or any odor from the incision site and said that he has no pain. Pt has been advised that if he notices any of these things to change to call the office so we can bring him in for an appt. He agrees.   York Cerise, CMA

## 2019-03-12 ENCOUNTER — Other Ambulatory Visit: Payer: No Typology Code available for payment source

## 2019-03-21 ENCOUNTER — Encounter: Payer: No Typology Code available for payment source | Admitting: Vascular Surgery

## 2019-04-01 ENCOUNTER — Other Ambulatory Visit: Payer: Self-pay | Admitting: Vascular Surgery

## 2019-04-01 DIAGNOSIS — I714 Abdominal aortic aneurysm, without rupture, unspecified: Secondary | ICD-10-CM

## 2019-04-04 ENCOUNTER — Ambulatory Visit: Payer: No Typology Code available for payment source | Admitting: Cardiovascular Disease

## 2019-05-02 ENCOUNTER — Ambulatory Visit: Payer: No Typology Code available for payment source | Admitting: Vascular Surgery

## 2019-05-02 ENCOUNTER — Encounter: Payer: No Typology Code available for payment source | Admitting: Vascular Surgery

## 2019-05-06 ENCOUNTER — Encounter: Payer: Self-pay | Admitting: Vascular Surgery

## 2019-05-08 ENCOUNTER — Telehealth: Payer: Self-pay | Admitting: Cardiovascular Disease

## 2019-05-08 NOTE — Telephone Encounter (Signed)

## 2019-05-09 ENCOUNTER — Telehealth: Payer: Self-pay | Admitting: Cardiovascular Disease

## 2019-05-12 ENCOUNTER — Encounter: Payer: Self-pay | Admitting: Cardiovascular Disease

## 2019-05-12 ENCOUNTER — Telehealth (INDEPENDENT_AMBULATORY_CARE_PROVIDER_SITE_OTHER): Payer: Self-pay | Admitting: Family Medicine

## 2019-05-12 VITALS — Ht 72.0 in | Wt 161.0 lb

## 2019-05-12 DIAGNOSIS — I480 Paroxysmal atrial fibrillation: Secondary | ICD-10-CM

## 2019-05-12 DIAGNOSIS — I10 Essential (primary) hypertension: Secondary | ICD-10-CM

## 2019-05-12 DIAGNOSIS — I5189 Other ill-defined heart diseases: Secondary | ICD-10-CM

## 2019-05-12 DIAGNOSIS — I714 Abdominal aortic aneurysm, without rupture, unspecified: Secondary | ICD-10-CM

## 2019-05-12 DIAGNOSIS — F17200 Nicotine dependence, unspecified, uncomplicated: Secondary | ICD-10-CM

## 2019-05-12 NOTE — Progress Notes (Signed)
Virtual Visit via Telephone Note   This visit type was conducted due to national recommendations for restrictions regarding the COVID-19 Pandemic (e.g. social distancing) in an effort to limit this patient's exposure and mitigate transmission in our community.  Due to his co-morbid illnesses, this patient is at least at moderate risk for complications without adequate follow up.  This format is felt to be most appropriate for this patient at this time.  The patient did not have access to video technology/had technical difficulties with video requiring transitioning to audio format only (telephone).  All issues noted in this document were discussed and addressed.  No physical exam could be performed with this format.  Please refer to the patient's chart for his  consent to telehealth for John Hicks.   Date:  05/12/2019   ID:  John Hicks, DOB March 27, 1951, MRN [id number]  Patient Location: Home Provider Location: Office  PCP:  Patient, No Pcp Per  Cardiologist:  Kate Sable, MD  Electrophysiologist:  None   Evaluation Performed:  Follow-Up Visit  Chief Complaint: Follow-up status post recent endovascular repair of AAA, chronic biventricular CHF, hypertension, tobacco abuse, atrial fibrillation.  History of Present Illness:    John Hicks is a 68 y.o. male with multiple medical problems not seen here before.  History of atrial fibrillation, congestive heart failure, dyspnea, hypertension, Chronic tobacco abuse, abdominal aortic aneurysm status post placement of endovascular stent graft on February 03, 2019 by Dr. Donzetta Matters.    History of biventricular heart failure back in May 2018.  Previous echo in March 2018 showed ejection fraction of 10 to 15%.  Moderately dilated RV.  [place] heart association stage IV disease.  Recent echocardiogram September 2020 showed LVEF 65-70%, severe LVH, normal wall motion, grade 1 DD, normal LV filling pressure, normal RV function.  Patient states  he has been doing well since his abdominal aortic aneurysm repair.  States she does have some mild shoulder pain and chest pain.  He states he believes this is from carrying his kerosene heater back and forth from outside to inside when refilling it.  He denies any progressive anginal or exertional symptoms.  He denies any palpitations or arrhythmias or rapid heart rhythms.  He continues on amiodarone for paroxysmal atrial fibrillation.  Patient states his hydralazine was discontinued during hospital admission for aortic aneurysm repair.  He has no blood pressure cuff but will be receiving one soon.  He states he was supposed to have a follow-up CT status post aneurysm repair but the [place] did not approve it.  The patient does not have symptoms concerning for COVID-19 infection (fever, chills, cough, or new shortness of breath).    Past Medical History:  Diagnosis Date  . AAA (abdominal aortic aneurysm) ([place])   . AF (atrial fibrillation) ([place])   . CHF (congestive heart failure) ([place])   . Dyspnea   . Enlarged heart   . Hypertension    Past Surgical History:  Procedure Laterality Date  . ABDOMINAL AORTIC ENDOVASCULAR STENT GRAFT N/A 02/03/2019   Procedure: ABDOMINAL AORTIC ENDOVASCULAR STENT GRAFT;  Surgeon: Waynetta Sandy, MD;  Location: [place];  Service: Vascular;  Laterality: N/A;  . APPENDECTOMY    . KNEE SURGERY    . ULTRASOUND GUIDANCE FOR VASCULAR ACCESS  02/03/2019   Procedure: Ultrasound Guidance For Vascular Access;  Surgeon: Waynetta Sandy, MD;  Location: [place];  Service: Vascular;;     Current Meds  Medication Sig  . amiodarone ([place])  200 MG tablet Take 200 mg by mouth daily.  Marland Kitchen aspirin 81 MG chewable tablet Chew 81 mg by mouth daily.  Marland Kitchen atorvastatin (LIPITOR) 40 MG tablet Take 1 tablet (40 mg total) by mouth daily at 6 PM.  . digoxin (LANOXIN) 0.125 MG tablet Take 1 tablet (0.125 mg total) by mouth daily.  . furosemide (LASIX) 20 MG tablet Take 1  tablet (20 mg total) by mouth 2 (two) times daily. Cardiology will refill in the future  . spironolactone (ALDACTONE) 25 MG tablet Take 0.5 tablets (12.5 mg total) by mouth daily.     Allergies:   Codeine   Social History   Tobacco Use  . Smoking status: Current Every Day Smoker    Packs/day: 1.00    Types: Cigarettes    Last attempt to quit: 01/26/2019    Years since quitting: 0.2  . Smokeless tobacco: Never Used  Substance Use Topics  . Alcohol use: No  . Drug use: Yes    Types: Marijuana     Family Hx: The patient's family history includes Multiple sclerosis in his sister; Psychiatric Illness (age of onset: 40) in his mother; Stroke (age of onset: 20) in his father.  ROS:   Please see the history of present illness.    All other systems reviewed and are negative.  Prior CV studies:   The following studies were reviewed today:  Echocardiogram January 29, 2019 1. The left ventricle has hyperdynamic systolic function, with an ejection fraction of >65%. Thecavity size was normal. There is severely increased left ventricular wall thickness. Leftventricular diastolic Doppler parameters are consistent with impaired relaxation. No evidence of left ventricular regional wall motion abnormalities. 2. The right ventricle has normal systolic function. The cavity was normal. There is no increasein right ventricular wall thickness. 3. The aortic valve is tricuspid. Aortic valve regurgitation is trivial by color flow Doppler. Nostenosis of the aortic valve. 4. There is severe dilatation of the ascending aorta measuring 52 mm. 5. Aneurysm of the abdominal aorta, measuring 100 mm. Complex atheroma and thrombus is noted in the aorta. 6. The aorta is abnormal unless otherwise noted. 7. The tricuspid valve is grossly normal. 8. The mitral valve is grossly normal  SUMMARY   LVEF 65-70%, severe LVH, normal wall motion, grade 1 DD, normal LV filling pressure, normal RV function, dilated  ascending aorta to 5.2 cm. The abdominal aorta measures up to 10 cm with complex plaque/atheroma noted, there is trivial AI.  Aortic ultrasound January 14, 2019 screening for aortic aneurysm.  FINDINGS: Abdominal aortic measurements as follows:  Proximal:  4.6 x 4.2 cm  Mid:  8.1 x 7.8cm  Distal:  9.3 x 10.7 cm  Extensive eccentric irregular thrombus within very large aortic aneurysm. Swirling rouleaux flow seen within the large mid to distal aneurysmal segment.  IMPRESSION: Large abdominal aortic aneurysm measuring up to 9.3 x 10.7 cm.  CTA assessment recommended.  Patient currently asymptomatic.  Findings discussed with patient.  Based on size and risk, patient was taken to the emergency room for evaluation and further workup.  CT of abdomen and pelvis without contrast on January 14, 2019 IMPRESSION: 1. Abdominal aortic aneurysm which arises slightly superior to the renal arteries and extends bifurcation, spanning a superior to inferior distance of 14.0 cm. The maximum transverse diameter of this aneurysm is measured at 9.7 x 9.1 cm. Moderate thrombus is seen within this aneurysm on noncontrast enhanced study. No obvious leakage seen on noncontrast enhanced study. Surgical consultation advised  given aneurysm of this size.  2. Enlarged prostate with multiple prostatic calculi. Prostate cannot be separated from inferior bladder. This is a finding that warrants clinical assessment as well as PSA. The possibility of prostatic neoplasm must be of concern given the size and configuration of the prostate.  3. There is a mass arising from the anterior mid left kidney measuring 1.4 x 1.2 cm which has attenuation values higher than is expected with a cyst. A small renal cell neoplasm must be of concern. Further evaluation with pre and post contrast MRI should be considered. Pre and post contrast CT could alternatively be performed, but would likely be of  decreased accuracy given lesion size.  4. Extensive arterial vascular calcification throughout the abdomen and pelvis. Multiple foci of coronary artery calcification also noted.  5. No hydronephrosis. No renal or ureteral calculus on either side.  6. No bowel obstruction. No abscess in the abdomen or pelvis. Appendix absent.  7.  Suspect a degree of sludge in gallbladder.  CT angiography of abdominal aorta with iliofemoral runoff January 28, 2019 IMPRESSION: 1. Large fusiform abdominal aortic aneurysm measuring up to 10 cm in greatest transverse diameter, similar to prior CT. There is significant mural thrombus within the aneurysm with high-grade luminal narrowing. No definite contrast identified outside of the lumen of the abdominal aortic aneurysm. Vascular surgery consult is advised. 2. Aneurysmal dilatation of the bilateral common iliac arteries measuring up to 2.2 cm similar to prior CT. 3. Advanced atherosclerotic calcification of the aorta and iliac arteries. 4. Dilution of the contrast within the abdominal aortic aneurysm with non opacification of the vessels distal to the aneurysm. 5. Constipation.  No bowel obstruction or active inflammation. 6. Enlarged prostate gland with median lobe hypertrophy indenting the base of the bladder.   Operative note February 03, 2019 for placement of aortobiiliac endograft for aortic abdominal aneurysm Percutaneous access and closure of left common femoral artery Open femoral exposure right common femoral artery and closure  Aortobiiliac endograft doing with main body right 35 x 14 x 14 extended with 20 x 13.5.  Contralateral left 16 x 9.5 cm extension with 20 x 11.5 distal.   68 year old male with large abdominal aortic aneurysm initially was planned for open repair now has standard criteria for endovascular repair.  We discussed the risk benefits alternatives he is agreeable to proceed.  There is actually probably about a  10 to 12 mm neck with a reverse taper.  The iliacs were heavily diseased as were the right common femoral artery.  This was exposed from an open standpoint.  The left was percutaneously accessed and closed.  At completion there were no type I or III endoleak's.  There were type II was noted from 4 lumbar arteries and from an IMA.  There were palpable pulses at the ankle.  Labs/Other Tests and Data Reviewed:    EKG:  An ECG dated February 02, 2019 was personally reviewed today and demonstrated:  Sinus bradycardia with first-degree AV block rate of 56.  Right bundle branch block.  Recent Labs: 01/29/2019: ALT 17 02/04/2019: BUN 43; Creatinine, Ser 1.49; Hemoglobin 8.1; Platelets 154; Potassium 4.8; Sodium 139   Recent Lipid Panel No results found for: CHOL, TRIG, HDL, CHOLHDL, LDLCALC, LDLDIRECT  Wt Readings from Last 3 Encounters:  05/12/19 161 lb (73 kg)  02/03/19 143 lb (64.9 kg)  01/22/19 144 lb 6.4 oz (65.5 kg)     Objective:    Vital Signs:  Ht 6' (1.829 m)  Wt 161 lb (73 kg)   BMI 21.84 kg/m    Speech pattern normal and responding appropriately to questions.  No evidence of dyspnea, wheezing, or cough noted.  ASSESSMENT & PLAN:     1. Essential hypertension Patient is unable to check his blood pressure at home.  States he will soon be receiving a blood pressure cuff.  Advised him to start taking his blood pressure at least 3 times a week.  Patient states his hydralazine was stopped during hospitalization for aortic aneurysm repair secondary to low blood pressures.  It was not restarted started at discharge.  Call if blood pressure sustained is at or above 140/90.  May need to add back antihypertensive medication such as hydralazine he was taking before.  2. Abdominal aortic aneurysm (AAA) without rupture [facility]) Patient had recent repair of a large fusiform abdominal aortic aneurysm measuring up to 10 cm.   Aortobiiliac endograft doing with main body right 35 x 14 x 14 extended  with 20 x 13.5.  Contralateral left 16 x 9.5 cm extension with 20 x 11.5 distal.  Patient denies any complications secondary to surgery.  States he was supposed to have a follow-up CT scheduled [facility] but [facility].  3. Tobacco use disorder Patient states he continues to smoke.  Highly advised patient to consider stopping smoking due to its likely contribution to abdominal aortic aneurysm and other vascular changes, hypertension, and lung disease.   4. PAF (paroxysmal atrial fibrillation) ([place]) Patient denies any palpitations, arrhythmias, heart racing, or fluttering sensation.  He remains on amiodarone.  Continue aspirin 81 mg.  Continue amiodarone 200 mg daily.  5. Diastolic Dysfunction Continue Lasix 20 mg p.o. twice daily.  Continue spironolactone 12.5 mg daily.  Recent echo in September 2020 showed severely increased left ventricular wall thickness. Left ventricular diastolic Doppler parameters are consistent with impaired relaxation.   COVID-19 Education: The signs and symptoms of COVID-19 were discussed with the patient and how to seek care for testing (follow up with PCP or arrange E-visit).  The importance of social distancing was discussed today.  Time:   Today, I have spent 25 minutes with the patient with telehealth technology discussing the above problems.     Medication Adjustments/Labs and Tests Ordered: Current medicines are reviewed at length with the patient today.  Concerns regarding medicines are outlined above.   Tests Ordered: No orders of the defined types were placed in this encounter.   Medication Changes: No orders of the defined types were placed in this encounter.   Follow Up:  Either In Person or Virtual in 6 month(s)  Signed, Verta Ellen, NP  05/12/2019 3:02 PM    [place]

## 2019-05-12 NOTE — Patient Instructions (Signed)
Medication Instructions:  Your physician recommends that you continue on your current medications as directed. Please refer to the Current Medication list given to you today.  *If you need a refill on your cardiac medications before your next appointment, please call your pharmacy*  Lab Work: None today If you have labs (blood work) drawn today and your tests are completely normal, you will receive your results only by: Marland Kitchen MyChart Message (if you have MyChart) OR . A paper copy in the mail If you have any lab test that is abnormal or we need to change your treatment, we will call you to review the results.  Testing/Procedures: None today  Follow-Up: At [facility], you and your health needs are our priority.  As part of our continuing mission to provide you with exceptional heart care, we have created designated Provider Care Teams.  These Care Teams include your primary Cardiologist (physician) and Advanced Practice Providers (APPs -  Physician Assistants and Nurse Practitioners) who all work together to provide you with the care you need, when you need it.  Your next appointment:   6 month(s)  The format for your next appointment:   Either In Person or Virtual  Provider:   Katina Dung, NP  Other Instructions None    Thank you for choosing [facility] !

## 2019-05-13 ENCOUNTER — Telehealth: Payer: Self-pay | Admitting: Licensed Clinical Social Worker

## 2019-05-13 ENCOUNTER — Telehealth: Payer: Self-pay | Admitting: Vascular Surgery

## 2019-05-13 NOTE — Telephone Encounter (Signed)
Record of calls and faxes to [facility] regarding pt cardiac CT  Pt CT scan scheduled for 03/12/19 was cancelled due to lack of auth from [place]. [facility]. Pt said he tried for a month to contact his PCP at the [place] with no response.  I tried to call [place] at [phone] and [phone] and left several messages. In the meantime pt was still trying to reach [place].    05/06/19 spoke with [facility] at the [place]. She  requested we send records to [place] to explain why we needed CT to the attention of [facility]. Faxed office notes and surgery notes on 05/06/19.   Did not receive fax so I called back on 05/11/09 and spoke with Bethena Roys. I faxed documents again and she said she will get them to the nurses who send auth.   05/13/19 I called and spoke with [facility] to see if they got my fax. She confirmed it was received and she forwarded it to the nurses. I was transferred to the nurse station and I left a vm for them to call me regarding pt and his continued care with [place] that we need the auth. Have been trying to get since 03/12/19.

## 2019-05-13 NOTE — Telephone Encounter (Signed)
CSW referred to assist patient with obtaining a BP cuff. CSW contacted patient to inform cuff will be delivered to home. Patient grateful for support and assistance. CSW available as needed. Jackie Micheline Markes, LCSW, CCSW-MCS [phone]  

## 2019-06-18 ENCOUNTER — Other Ambulatory Visit (HOSPITAL_COMMUNITY): Payer: Self-pay | Admitting: Family

## 2019-06-18 ENCOUNTER — Other Ambulatory Visit: Payer: Self-pay | Admitting: Family

## 2019-06-18 DIAGNOSIS — Z95828 Presence of other vascular implants and grafts: Secondary | ICD-10-CM

## 2019-06-18 NOTE — Telephone Encounter (Signed)
06/02/20  [facility] and spoke to [place]. I asked for manager's name so Delsa Sale could ould speak with her and tryt to get auth. Maurene Capes is office manager's name. Delsa Sale spoke with her and Jiles Garter said she will take care of it.  06/11/20 called back and receptionist said it had been forwarded to the [facility] which is the final step to get it approved through the [facility].    [place] [facility] [phone] ext. [phone]. Spoke with [employer]. She stated they were behind on approvals due to the holidays. She said the nurse has to approve it and it will be done this week sometime. Will call the pt to make sure he wants to come see [place] and they will call us for the appt and will fax auth.  06/16/19 called and spoke to [facility]. She said the case is pending and the nurse handling his case and will approve it. Nurse is Manuela Schwartz and her ext [phone].  Left her a detailed vm and asked that she return my call.  06/18/19  Called Manuela Schwartz back and left another detailed message asking for a return call.

## 2019-06-27 ENCOUNTER — Ambulatory Visit (HOSPITAL_COMMUNITY)
Admission: RE | Admit: 2019-06-27 | Discharge: 2019-06-27 | Disposition: A | Discharge status: Home / Self Care | Payer: No Typology Code available for payment source | Source: Ambulatory Visit | Attending: Family | Admitting: Family

## 2019-06-27 ENCOUNTER — Other Ambulatory Visit: Payer: Self-pay

## 2019-06-27 DIAGNOSIS — Z95828 Presence of other vascular implants and grafts: Secondary | ICD-10-CM

## 2019-06-27 LAB — POCT I-STAT CREATININE: Creatinine, Ser: 1.8 mg/dL — ABNORMAL HIGH (ref 0.61–1.24)

## 2019-06-27 MED ORDER — IOHEXOL 350 MG/ML SOLN
100.0000 mL | Freq: Once | INTRAVENOUS | Status: AC | PRN
  Administered 2019-06-27: 08:00:00 80 mL via INTRAVENOUS

## 2019-07-04 ENCOUNTER — Other Ambulatory Visit: Payer: Self-pay

## 2019-07-04 ENCOUNTER — Encounter: Payer: Self-pay | Admitting: Vascular Surgery

## 2019-07-04 ENCOUNTER — Ambulatory Visit (INDEPENDENT_AMBULATORY_CARE_PROVIDER_SITE_OTHER): Payer: No Typology Code available for payment source | Admitting: Vascular Surgery

## 2019-07-04 DIAGNOSIS — I714 Abdominal aortic aneurysm, without rupture, unspecified: Secondary | ICD-10-CM

## 2019-07-04 NOTE — Progress Notes (Signed)
    Virtual Visit via Telephone Note   I connected with John Hicks on 07/04/2019 using the [employer] by telephone and verified that I was speaking with the correct person using two identifiers. Patient was located at home.   The limitations of evaluation and management by telemedicine and the availability of in person appointments have been previously discussed with the patient and are documented in the patients chart. The patient expressed understanding and consented to proceed.   Chief Complaint: f/u [facility]  History of Present Illness: John Hicks is a 69 y.o. male with previous history of large symptomatic aneurysm.  Initially was plan for open repair we then attempted endovascular repair which was successful.  He now has CT scan.  States that he is doing very well.  Has had trips to the beach but they are unfortunately canceled.  Otherwise he is doing okay eating well without new back or abdominal pain.  Does take aspirin and statin as prescribed.  Past Medical History:  Diagnosis Date  . AAA (abdominal aortic aneurysm) ([place])   . AF (atrial fibrillation) ([place])   . CHF (congestive heart failure) ([place])   . Dyspnea   . Enlarged heart   . Hypertension     Past Surgical History:  Procedure Laterality Date  . ABDOMINAL AORTIC ENDOVASCULAR STENT GRAFT N/A 02/03/2019   Procedure: ABDOMINAL AORTIC ENDOVASCULAR STENT GRAFT;  Surgeon: Waynetta Sandy, MD;  Location: [place];  Service: Vascular;  Laterality: N/A;  . APPENDECTOMY    . KNEE SURGERY    . ULTRASOUND GUIDANCE FOR VASCULAR ACCESS  02/03/2019   Procedure: Ultrasound Guidance For Vascular Access;  Surgeon: Waynetta Sandy, MD;  Location: [place];  Service: Vascular;;    No outpatient medications have been marked as taking for the 07/04/19 encounter (Appointment) with Waynetta Sandy, MD.    12 system ROS was negative unless otherwise noted in HPI   Observations/Objective: He is awake and alert with  good comprehension during our discussion.  CTA IMPRESSION: VASCULAR  1. Successful endovascular aortic repair of infrarenal abdominal aortic aneurysm. The now excluded aneurysm sac has significantly decreased in size measuring 7.7 x 7.6 cm compared to 9.7 x 9.0 cm previously. No evidence of endoleak or other complicating feature.  NON-VASCULAR  1. Ancillary findings as above without significant interval change.  Assessment and Plan: 69 year old male status post endovascular aneurysm repair with extended criteria for 9 cm aneurysm that is now down in the 7 cm range.  He is overall doing well.  We will get him to follow-up with 1 year with aortic aneurysm duplex.  Follow Up Instructions:   Follow up in 1 year(s)   I discussed the assessment and treatment plan with the patient. The patient was provided an opportunity to ask questions and all were answered. The patient agreed with the plan and demonstrated an understanding of the instructions.   The patient was advised to call back or seek an in-person evaluation if the symptoms worsen or if the condition fails to improve as anticipated.  I spent 11 minutes with the patient via telephone encounter.   Signed, Servando Snare [facility] Office: [phone]  07/04/2019, 12:28 PM

## 2019-07-08 ENCOUNTER — Other Ambulatory Visit: Payer: Self-pay | Admitting: *Deleted

## 2019-07-08 DIAGNOSIS — I714 Abdominal aortic aneurysm, without rupture, unspecified: Secondary | ICD-10-CM

## 2020-04-20 NOTE — Progress Notes (Signed)
Cardiology Office Note   Date:  04/21/2020   ID:  John Hicks, DOB 24-Apr-1951, MRN [id number]  PCP:  Patient, No Pcp Per  Cardiologist:   Minus Breeding, MD Referring:  None  Chief Complaint  Patient presents with  . Palpitations      History of Present Illness: John Hicks is a 69 y.o. male who presents for follow up of atrial fib.  He has had endovascular stenting to an AAA in Sept 2020.  He has history of biventricular HF with EF 10 -15% in 2018.  However, EF was was 65% in 2020 with severe LVH.    He returns today for follow-up.  He actually says he feels quite well.  He was blowing leaves before he got here.  Denies any chest discomfort, neck or arm discomfort.  He has no shortness of breath, PND or orthopnea.  He does have some episodes of palpitations where he feels anxious.  He said he was on benzodiazepines for a long time and he can get those routinely anymore.  He occasionally finds them and takes them.  He smokes marijuana.  He says he does not drink any alcohol at all.    Past Medical History:  Diagnosis Date  . AAA (abdominal aortic aneurysm) ([place])   . AF (atrial fibrillation) ([place])   . CHF (congestive heart failure) ([place])   . Dyspnea   . Hypertension     Past Surgical History:  Procedure Laterality Date  . ABDOMINAL AORTIC ENDOVASCULAR STENT GRAFT N/A 02/03/2019   Procedure: ABDOMINAL AORTIC ENDOVASCULAR STENT GRAFT;  Surgeon: Waynetta Sandy, MD;  Location: [facility];  Service: Vascular;  Laterality: N/A;  . APPENDECTOMY    . KNEE SURGERY    . ULTRASOUND GUIDANCE FOR VASCULAR ACCESS  02/03/2019   Procedure: Ultrasound Guidance For Vascular Access;  Surgeon: Waynetta Sandy, MD;  Location: [facility];  Service: Vascular;;     Current Outpatient Medications  Medication Sig Dispense Refill  . amiodarone (PACERONE) 200 MG tablet Take 200 mg by mouth daily.  0  . aspirin 81 MG chewable tablet Chew 81 mg by mouth daily.    . furosemide  (LASIX) 20 MG tablet Take 1 tablet (20 mg total) by mouth 2 (two) times daily. Cardiology will refill in the future 60 tablet 11  . spironolactone (ALDACTONE) 25 MG tablet Take 0.5 tablets (12.5 mg total) by mouth daily. 30 tablet 6   No current facility-administered medications for this visit.    Allergies:   Codeine    ROS:  Please see the history of present illness.   Otherwise, review of systems are positive for none.   All other systems are reviewed and negative.    PHYSICAL EXAM: VS:  BP (!) 148/82   Pulse 64   Ht 6' (1.829 m)   Wt 176 lb (79.8 kg)   SpO2 98%   BMI 23.87 kg/m  , BMI Body mass index is 23.87 kg/m. GENERAL:  Well appearing NECK:  No jugular venous distention, waveform within normal limits, carotid upstroke brisk and symmetric, no bruits, no thyromegaly LUNGS:  Clear to auscultation bilaterally CHEST:  Unremarkable HEART:  PMI not displaced or sustained,S1 and S2 within normal limits, no S3, no S4, no clicks, no rubs, no murmurs ABD:  Flat, positive bowel sounds normal in frequency in pitch, no bruits, no rebound, no guarding, no midline pulsatile mass, no hepatomegaly, no splenomegaly, he does have firm noncompressible nonpulsatile mass in his  mid lower abdomen EXT:  2 plus pulses throughout, no edema, no cyanosis no clubbing   EKG:  EKG is ordered today. The ekg ordered today demonstrates sinus rhythm with premature atrial contractions, right bundle branch block.  No significant change from previous.   Recent Labs: 06/27/2019: Creatinine, Ser 1.80    Lipid Panel No results found for: CHOL, TRIG, HDL, CHOLHDL, VLDL, LDLCALC, LDLDIRECT    Wt Readings from Last 3 Encounters:  04/21/20 176 lb (79.8 kg)  05/12/19 161 lb (73 kg)  02/03/19 143 lb (64.9 kg)      Other studies Reviewed: Additional studies/ records that were reviewed today include: Hospital records from AAA repair and 2018 visit with CHF. Review of the above records demonstrates:  Please  see elsewhere in the note.     ASSESSMENT AND PLAN:  Essential hypertension His blood pressure is mildly elevated.  However, it has typically been quite low.  At this point I am not going to not make any changes.    Abdominal aortic aneurysm (AAA) without rupture [facility]) He is to have follow-up with Dr. Donzetta Matters in February.  He should be on a callback list.   Tobacco use disorder He understands the need to stop smoking.   PAF (paroxysmal atrial fibrillation) ([place]) He remains on amiodarone.  I am going to check a TSH and a c-Met.  He can stop his digoxin.  I am going to apply a 4-week monitor.  Diastolic Dysfunction  He seems to be euvolemic.  No change in therapy.   Current medicines are reviewed at length with the patient today.  The patient does not have concerns regarding medicines.  The following changes have been made:  no change  Labs/ tests ordered today include:   Orders Placed This Encounter  Procedures  . Comprehensive metabolic panel  . TSH  . CARDIAC EVENT MONITOR  . EKG 12-Lead     Disposition:   FU with APP in six months.     Signed, Minus Breeding, MD  04/21/2020 5:23 PM    [place]

## 2020-04-21 ENCOUNTER — Encounter: Payer: Self-pay | Admitting: Cardiology

## 2020-04-21 ENCOUNTER — Ambulatory Visit (INDEPENDENT_AMBULATORY_CARE_PROVIDER_SITE_OTHER): Payer: PPO | Admitting: Cardiology

## 2020-04-21 VITALS — BP 148/82 | HR 64 | Ht 72.0 in | Wt 176.0 lb

## 2020-04-21 DIAGNOSIS — Z720 Tobacco use: Secondary | ICD-10-CM

## 2020-04-21 DIAGNOSIS — Z8679 Personal history of other diseases of the circulatory system: Secondary | ICD-10-CM

## 2020-04-21 DIAGNOSIS — I5032 Chronic diastolic (congestive) heart failure: Secondary | ICD-10-CM

## 2020-04-21 DIAGNOSIS — I480 Paroxysmal atrial fibrillation: Secondary | ICD-10-CM | POA: Diagnosis not present

## 2020-04-21 DIAGNOSIS — Z9889 Other specified postprocedural states: Secondary | ICD-10-CM | POA: Diagnosis not present

## 2020-04-21 NOTE — Patient Instructions (Addendum)
Medication Instructions:   Your physician has recommended you make the following change in your medication:   Stop digoxin  Continue other medications the same  Labwork:  Your physician recommends that you return for lab work in: tomorrow to check your CMET and TSH. This may be done at [facility] 8:00 am - 4:00 pm except 11:30 am - 12:10 pm. No appointment is needed.  Testing/Procedures: Your physician has recommended that you wear an event monitor. Event monitors are medical devices that record the heart's electrical activity. Doctors most often [place] these monitors to diagnose arrhythmias. Arrhythmias are problems with the speed or rhythm of the heartbeat. The monitor is a small, portable device. You can wear one while you do your normal daily activities. This is usually used to diagnose what is causing palpitations/syncope (passing out).  Follow-Up:  Your physician recommends that you schedule a follow-up appointment in: 6 months.  Any Other Special Instructions Will Be Listed Below (If Applicable).  If you need a refill on your cardiac medications before your next appointment, please call your pharmacy.

## 2020-04-22 ENCOUNTER — Other Ambulatory Visit: Payer: Self-pay

## 2020-04-22 ENCOUNTER — Other Ambulatory Visit (HOSPITAL_COMMUNITY)
Admission: RE | Admit: 2020-04-22 | Discharge: 2020-04-22 | Disposition: A | Discharge status: Home / Self Care | Payer: PPO | Source: Ambulatory Visit | Attending: Cardiology | Admitting: Cardiology

## 2020-04-22 DIAGNOSIS — I480 Paroxysmal atrial fibrillation: Secondary | ICD-10-CM | POA: Diagnosis not present

## 2020-04-22 DIAGNOSIS — I5032 Chronic diastolic (congestive) heart failure: Secondary | ICD-10-CM | POA: Diagnosis not present

## 2020-04-22 LAB — COMPREHENSIVE METABOLIC PANEL
ALT: 16 U/L (ref 0–44)
AST: 19 U/L (ref 15–41)
Albumin: 4.1 g/dL (ref 3.5–5.0)
Alkaline Phosphatase: 49 U/L (ref 38–126)
Anion gap: 8 (ref 5–15)
BUN: 30 mg/dL — ABNORMAL HIGH (ref 8–23)
CO2: 28 mmol/L (ref 22–32)
Calcium: 10 mg/dL (ref 8.9–10.3)
Chloride: 100 mmol/L (ref 98–111)
Creatinine, Ser: 1.81 mg/dL — ABNORMAL HIGH (ref 0.61–1.24)
GFR, Estimated: 40 mL/min — ABNORMAL LOW (ref >60)
Glucose, Bld: 116 mg/dL — ABNORMAL HIGH (ref 70–99)
Potassium: 4.9 mmol/L (ref 3.5–5.1)
Sodium: 136 mmol/L (ref 135–145)
Total Bilirubin: 0.6 mg/dL (ref 0.3–1.2)
Total Protein: 7.9 g/dL (ref 6.5–8.1)

## 2020-04-22 LAB — TSH: TSH: 4.875 u[IU]/mL — ABNORMAL HIGH (ref 0.350–4.500)

## 2020-04-26 ENCOUNTER — Telehealth: Payer: Self-pay | Admitting: *Deleted

## 2020-04-26 DIAGNOSIS — Z720 Tobacco use: Secondary | ICD-10-CM | POA: Diagnosis not present

## 2020-04-26 DIAGNOSIS — Z0001 Encounter for general adult medical examination with abnormal findings: Secondary | ICD-10-CM | POA: Diagnosis not present

## 2020-04-26 DIAGNOSIS — Z1389 Encounter for screening for other disorder: Secondary | ICD-10-CM | POA: Diagnosis not present

## 2020-04-26 DIAGNOSIS — Z6823 Body mass index (BMI) 23.0-23.9, adult: Secondary | ICD-10-CM | POA: Diagnosis not present

## 2020-04-26 DIAGNOSIS — F419 Anxiety disorder, unspecified: Secondary | ICD-10-CM | POA: Diagnosis not present

## 2020-04-26 DIAGNOSIS — Z79899 Other long term (current) drug therapy: Secondary | ICD-10-CM | POA: Diagnosis not present

## 2020-04-26 NOTE — Telephone Encounter (Signed)
-----   Message from Minus Breeding, MD sent at 04/22/2020  8:40 PM EST ----- TSH is very mildly elevated.  Creat is elevated but stable.  Liver enzymes are OK.  No change in therapy or further testing at this time.

## 2020-04-27 NOTE — Telephone Encounter (Signed)
Patient returned call

## 2020-04-27 NOTE — Telephone Encounter (Signed)
Patient informed and verbalized understanding of plan. 

## 2020-04-30 ENCOUNTER — Ambulatory Visit (INDEPENDENT_AMBULATORY_CARE_PROVIDER_SITE_OTHER): Payer: PPO

## 2020-04-30 DIAGNOSIS — I480 Paroxysmal atrial fibrillation: Secondary | ICD-10-CM

## 2020-05-07 ENCOUNTER — Encounter (INDEPENDENT_AMBULATORY_CARE_PROVIDER_SITE_OTHER): Payer: Self-pay | Admitting: *Deleted

## 2020-05-18 DIAGNOSIS — F419 Anxiety disorder, unspecified: Secondary | ICD-10-CM | POA: Diagnosis not present

## 2020-05-18 DIAGNOSIS — Z6824 Body mass index (BMI) 24.0-24.9, adult: Secondary | ICD-10-CM | POA: Diagnosis not present

## 2020-06-18 ENCOUNTER — Encounter (INDEPENDENT_AMBULATORY_CARE_PROVIDER_SITE_OTHER): Payer: Self-pay | Admitting: *Deleted

## 2020-06-29 ENCOUNTER — Other Ambulatory Visit: Payer: Self-pay

## 2020-06-29 DIAGNOSIS — I714 Abdominal aortic aneurysm, without rupture, unspecified: Secondary | ICD-10-CM

## 2020-07-07 ENCOUNTER — Ambulatory Visit: Payer: HMO | Admitting: Physician Assistant

## 2020-07-07 ENCOUNTER — Ambulatory Visit (HOSPITAL_COMMUNITY)
Admission: RE | Admit: 2020-07-07 | Discharge: 2020-07-07 | Disposition: A | Discharge status: Home / Self Care | Payer: HMO | Source: Ambulatory Visit | Attending: Vascular Surgery | Admitting: Vascular Surgery

## 2020-07-07 ENCOUNTER — Other Ambulatory Visit: Payer: Self-pay

## 2020-07-07 VITALS — BP 126/79 | HR 63 | Temp 98.5°F | Resp 20 | Ht 72.0 in | Wt 180.3 lb

## 2020-07-07 DIAGNOSIS — I714 Abdominal aortic aneurysm, without rupture, unspecified: Secondary | ICD-10-CM

## 2020-07-07 DIAGNOSIS — F17200 Nicotine dependence, unspecified, uncomplicated: Secondary | ICD-10-CM

## 2020-07-07 NOTE — Progress Notes (Signed)
HISTORY AND PHYSICAL     CC:  follow up. Requesting Provider:  Redmond School, MD  HPI: This is a 70 y.o. male who is here today for follow up for hx of AAA and s/p EVAR on 02/03/2019 by Dr. Donzetta Matters.    Pt was last seen by virtual visit on 07/04/2019 and at that time, he was doing well and was not having any back or abdominal pain.    The pt returns today for follow up.  He continues to do well and does not have any abdominal or back pain.  He states he does not get cramping in his legs when he walks.  He states that sometimes he is a little unsteady walking but contributes this to the uneven ground he is walking on.    The pt is not on a statin for cholesterol management.    The pt is on an aspirin.    Other AC:  none The pt is on aldactone for hypertension.  The pt does not have diabetes. Tobacco hx:  current   Past Medical History:  Diagnosis Date  . AAA (abdominal aortic aneurysm) ([place])   . AF (atrial fibrillation) ([place])   . CHF (congestive heart failure) ([place])   . Dyspnea   . Hypertension     Past Surgical History:  Procedure Laterality Date  . ABDOMINAL AORTIC ENDOVASCULAR STENT GRAFT N/A 02/03/2019   Procedure: ABDOMINAL AORTIC ENDOVASCULAR STENT GRAFT;  Surgeon: Waynetta Sandy, MD;  Location: [facility];  Service: Vascular;  Laterality: N/A;  . APPENDECTOMY    . KNEE SURGERY    . ULTRASOUND GUIDANCE FOR VASCULAR ACCESS  02/03/2019   Procedure: Ultrasound Guidance For Vascular Access;  Surgeon: Waynetta Sandy, MD;  Location: [facility];  Service: Vascular;;    Allergies  Allergen Reactions  . Codeine Other (See Comments)    agitation    Current Outpatient Medications  Medication Sig Dispense Refill  . ALPRAZolam (XANAX) 0.5 MG tablet Take 0.5 mg by mouth 3 (three) times daily as needed.    Marland Kitchen amiodarone (PACERONE) 200 MG tablet Take 200 mg by mouth daily.  0  . aspirin 81 MG chewable tablet Chew 81 mg by mouth daily.    . furosemide (LASIX) 20 MG tablet  Take 1 tablet (20 mg total) by mouth 2 (two) times daily. Cardiology will refill in the future 60 tablet 11  . spironolactone (ALDACTONE) 25 MG tablet Take 0.5 tablets (12.5 mg total) by mouth daily. 30 tablet 6   No current facility-administered medications for this visit.    Family History  Problem Relation Age of Onset  . Stroke Father 25       went in the bathroom and collapsed  . Multiple sclerosis Sister   . Psychiatric Illness Mother 69    Social History   Socioeconomic History  . Marital status: Single    Spouse name: Not on file  . Number of children: Not on file  . Years of education: Not on file  . Highest education level: Not on file  Occupational History  . Occupation: Retired  Tobacco Use  . Smoking status: Current Every Day Smoker    Packs/day: 1.00    Types: Cigarettes    Last attempt to quit: 01/26/2019    Years since quitting: 1.4  . Smokeless tobacco: Never Used  Vaping Use  . Vaping Use: Never used  Substance and Sexual Activity  . Alcohol use: No  . Drug use: Yes  Types: Marijuana  . Sexual activity: Not Currently  Other Topics Concern  . Not on file  Social History Narrative   Pt lives w/ landlord and [place].   Social Determinants of Health   Financial Resource Strain: Not on file  Food Insecurity: Not on file  Transportation Needs: Not on file  Physical Activity: Not on file  Stress: Not on file  Social Connections: Not on file  Intimate Partner Violence: Not on file     REVIEW OF SYSTEMS:   [X]  denotes positive finding, [ ]  denotes negative finding Cardiac  Comments:  Chest pain or chest pressure:    Shortness of breath upon exertion:    Short of breath when lying flat:    Irregular heart rhythm:        Vascular    Pain in calf, thigh, or hip brought on by ambulation:    Pain in feet at night that wakes you up from your sleep:     Blood clot in your veins:    Leg swelling:         Pulmonary    Oxygen at home:     Productive cough:     Wheezing:         Neurologic    Sudden weakness in arms or legs:     Sudden numbness in arms or legs:     Sudden onset of difficulty speaking or slurred speech:    Temporary loss of vision in one eye:     Problems with dizziness:         Gastrointestinal    Blood in stool:     Vomited blood:         Genitourinary    Burning when urinating:     Blood in urine:        Psychiatric    Major depression:         Hematologic    Bleeding problems:    Problems with blood clotting too easily:        Skin    Rashes or ulcers:        Constitutional    Fever or chills:      PHYSICAL EXAMINATION:  Today's Vitals   07/07/20 0916  BP: 126/79  Pulse: 63  Resp: 20  Temp: 98.5 F (36.9 C)  TempSrc: Temporal  SpO2: 100%  Weight: 180 lb 4.8 oz (81.8 kg)  Height: 6' (1.829 m)   Body mass index is 24.45 kg/m.   General:  WDWN in NAD; vital signs documented above Gait: Not observed HENT: WNL, normocephalic Pulmonary: normal non-labored breathing , without wheezing Cardiac: regular HR, without  Murmur; without carotid bruits Abdomen: soft, NT, no masses; aortic pulse is palpable Skin: without rashes Vascular Exam/Pulses:  Right Left  Radial Unable to palpate 2+ (normal)  Femoral 2+ (normal) 2+ (normal)  Popliteal Unable to palpate Unable to palpate  DP absent monophasic  PT biphasic monophasic  Peroneal biphasic monophasic   Extremities: without ischemic changes, without Gangrene , without cellulitis; without open wounds;  Musculoskeletal: no muscle wasting or atrophy  Neurologic: A&O X 3;  No focal weakness or paresthesias are detected Psychiatric:  The pt has Normal affect.   Non-Invasive Vascular Imaging:    EVAR Arterial duplex on 07/07/2020: +----------+----------------+-------------------+-------------------+       Diameter AP (cm)Diameter Trans (cm)Velocities (cm/sec)   +----------+----------------+-------------------+-------------------+  Aorta   6.50      7.12        34           +----------+----------------+-------------------+-------------------+  Right Limb2.30      2.13        34           +----------+----------------+-------------------+-------------------+  Left Limb 1.91      1.96        48           +----------+----------------+-------------------+-------------------+   Summary:  Abdominal Aorta: Patent endovascular aneurysm repair with no evidence of  endoleak. The largest aortic diameter has decreased compared to prior  exam. Previous diameter measurement was 7.7 x 7.6 cm obtained by CT on  06/27/19.   ASSESSMENT/PLAN:: 70 y.o. male here for follow up for EVAR on 02/03/2019 by Dr. Donzetta Matters  -pt doing well today and does not have any back or abdominal pain.  His u/s today reveals 7.12cm, which is decreased from previous exam of 7.7 x 7.6cm on previous CT scan 06/27/2019.   -pt with abnormal doppler signals - at his next visit, will get ABI to get baseline.  He knows to call sooner if he develops any rest pain or non healing wounds prior to next visit.   -discussed importance of smoking cessation with pt.   -will defer to primary team about statin-if he can tolerate, he should be on this from vascular standpoint.  Continue aspirin -pt will f/u in one year with ABI and EVAR duplex.    Leontine Locket, [facility] [phone]  Clinic MD:   Scot Dock

## 2020-07-08 ENCOUNTER — Telehealth: Payer: Self-pay | Admitting: Cardiology

## 2020-07-08 NOTE — Telephone Encounter (Signed)
CHF ICD code provided to [facility].

## 2020-07-08 NOTE — Telephone Encounter (Signed)
    Almyra Free with [facility] would like to verify pt's diagnosis for CHF

## 2020-08-11 DIAGNOSIS — I714 Abdominal aortic aneurysm, without rupture: Secondary | ICD-10-CM | POA: Diagnosis not present

## 2020-08-11 DIAGNOSIS — H6093 Unspecified otitis externa, bilateral: Secondary | ICD-10-CM | POA: Diagnosis not present

## 2020-08-11 DIAGNOSIS — L309 Dermatitis, unspecified: Secondary | ICD-10-CM | POA: Diagnosis not present

## 2020-08-11 DIAGNOSIS — Z1331 Encounter for screening for depression: Secondary | ICD-10-CM | POA: Diagnosis not present

## 2020-08-11 DIAGNOSIS — Z6825 Body mass index (BMI) 25.0-25.9, adult: Secondary | ICD-10-CM | POA: Diagnosis not present

## 2020-09-27 ENCOUNTER — Telehealth (INDEPENDENT_AMBULATORY_CARE_PROVIDER_SITE_OTHER): Payer: Self-pay

## 2020-09-27 ENCOUNTER — Encounter (INDEPENDENT_AMBULATORY_CARE_PROVIDER_SITE_OTHER): Payer: Self-pay | Admitting: Gastroenterology

## 2020-09-27 ENCOUNTER — Ambulatory Visit (INDEPENDENT_AMBULATORY_CARE_PROVIDER_SITE_OTHER): Payer: HMO | Admitting: Gastroenterology

## 2020-09-27 ENCOUNTER — Other Ambulatory Visit (INDEPENDENT_AMBULATORY_CARE_PROVIDER_SITE_OTHER): Payer: Self-pay

## 2020-09-27 ENCOUNTER — Other Ambulatory Visit: Payer: Self-pay

## 2020-09-27 ENCOUNTER — Encounter (INDEPENDENT_AMBULATORY_CARE_PROVIDER_SITE_OTHER): Payer: Self-pay

## 2020-09-27 DIAGNOSIS — K5900 Constipation, unspecified: Secondary | ICD-10-CM | POA: Diagnosis not present

## 2020-09-27 MED ORDER — PEG 3350-KCL-NA BICARB-NACL 420 G PO SOLR: 4000.0000 mL | ORAL | 0 refills | Status: DC

## 2020-09-27 NOTE — Telephone Encounter (Signed)
John Hicks, CMA 

## 2020-09-27 NOTE — Progress Notes (Signed)
John Hicks, M.D. Gastroenterology & Hepatology [facility] [address] [place], [place] [address] Primary Care Physician: Redmond School, MD [address] [place] [address]  Referring MD: PCP  Chief Complaint: Constipation and occasional episodes of diarrhea  History of Present Illness: John Hicks is a 70 y.o. male with PMH AAA s/p repair, atrial fibrillation, CKD, HTN who presents for evaluation of alternating diarrhea and constipation.  States that the last 2 years he has presented a change in his bowel movements. He reports that he was very regular in the past as he used to have BM every day. However he reports that there were multiple changes in his life in the last two years, along changes in the diet he has.  States that he has not had a very healthy diet overall. He reports that his bowel movements are usually very hard, but when he eventually is able to have a BM he has large and watery BMs. Does not take any laxatives. He has 3 BMs per week. Occasionally has some abdominal cramping in his abdomen when he has not had bowel movements. Has lost significant amount of weight on purpose after his medical issues have improved post surgically.  The patient denies having any nausea, vomiting, fever, chills, hematochezia, melena, hematemesis, abdominal distention, jaundice, pruritus.  Most recent blood testing from 05/10/2020 showed calcium of 10.0, potassium was 4.9 and sodium was 136, TSH was 4.87.  Last EUM:PNTIR Last Colonoscopy:never  FHx: neg for any gastrointestinal/liver disease, no malignancies Social: smoking 1 pack a day, smokes marijuana at night, neg alcohol Surgical: appendectomy, AAA repair  Past Medical History: Past Medical History:  Diagnosis Date  . AAA (abdominal aortic aneurysm) ([place])   . AF (atrial fibrillation) ([place])   . CHF (congestive heart failure) ([place])   . Dyspnea   . Hypertension      Past Surgical History: Past Surgical History:  Procedure Laterality Date  . ABDOMINAL AORTIC ENDOVASCULAR STENT GRAFT N/A 02/03/2019   Procedure: ABDOMINAL AORTIC ENDOVASCULAR STENT GRAFT;  Surgeon: Waynetta Sandy, MD;  Location: [facility];  Service: Vascular;  Laterality: N/A;  . APPENDECTOMY    . KNEE SURGERY    . ULTRASOUND GUIDANCE FOR VASCULAR ACCESS  02/03/2019   Procedure: Ultrasound Guidance For Vascular Access;  Surgeon: Waynetta Sandy, MD;  Location: [facility];  Service: Vascular;;    Family History: Family History  Problem Relation Age of Onset  . Stroke Father 3       went in the bathroom and collapsed  . Multiple sclerosis Sister   . Psychiatric Illness Mother 79    Social History: Social History   Tobacco Use  Smoking Status Current Every Day Smoker  . Packs/day: 1.00  . Types: Cigarettes  . Last attempt to quit: 01/26/2019  . Years since quitting: 1.6  Smokeless Tobacco Never Used   Social History   Substance and Sexual Activity  Alcohol Use No   Social History   Substance and Sexual Activity  Drug Use Yes  . Types: Marijuana    Allergies: Allergies  Allergen Reactions  . Codeine Other (See Comments)    agitation    Medications: Current Outpatient Medications  Medication Sig Dispense Refill  . ALPRAZolam (XANAX) 0.5 MG tablet Take 0.5 mg by mouth 3 (three) times daily as needed.    Marland Kitchen amiodarone (PACERONE) 200 MG tablet Take 200 mg by mouth daily.  0  . aspirin 81 MG chewable tablet  Chew 81 mg by mouth daily.    . furosemide (LASIX) 20 MG tablet Take 1 tablet (20 mg total) by mouth 2 (two) times daily. Cardiology will refill in the future 60 tablet 11  . spironolactone (ALDACTONE) 25 MG tablet Take 0.5 tablets (12.5 mg total) by mouth daily. (Patient taking differently: Take 25 mg by mouth daily.) 30 tablet 6   No current facility-administered medications for this visit.    Review of Systems: GENERAL: negative for malaise,  night sweats HEENT: No changes in hearing or vision, no nose bleeds or other nasal problems. NECK: Negative for lumps, goiter, pain and significant neck swelling RESPIRATORY: Negative for cough, wheezing CARDIOVASCULAR: Negative for chest pain, leg swelling, palpitations, orthopnea GI: SEE HPI MUSCULOSKELETAL: Negative for joint pain or swelling, back pain, and muscle pain. SKIN: Negative for lesions, rash PSYCH: Negative for sleep disturbance, mood disorder and recent psychosocial stressors. HEMATOLOGY Negative for prolonged bleeding, bruising easily, and swollen nodes. ENDOCRINE: Negative for cold or heat intolerance, polyuria, polydipsia and goiter. NEURO: negative for tremor, gait imbalance, syncope and seizures. The remainder of the review of systems is noncontributory.   Physical Exam: BP 127/81 (BP Location: Left Arm, Patient Position: Sitting, Cuff Size: Large)   Pulse 98   Temp 98.1 F (36.7 C) (Oral)   Ht 6' (1.829 m)   Wt 183 lb (83 kg)   BMI 24.82 kg/m  GENERAL: The patient is AO x3, in no acute distress. HEENT: Head is normocephalic and atraumatic. EOMI are intact. Mouth is well hydrated and without lesions. NECK: Supple. No masses LUNGS: Clear to auscultation. No presence of rhonchi/wheezing/rales. Adequate chest expansion HEART: RRR, normal s1 and s2. ABDOMEN: Soft, nontender, no guarding, no peritoneal signs, and nondistended. BS +. No masses EXTREMITIES: Without any cyanosis, clubbing, rash, lesions or edema. NEUROLOGIC: AOx3, no focal motor deficit. SKIN: no jaundice, no rashes   Imaging/Labs: as above  I personally reviewed and interpreted the available labs, imaging and endoscopic files.  Impression and Plan: John Hicks is a 70 y.o. male with PMH AAA s/p repair, atrial fibrillation, CKD, HTN who presents for evaluation of alternating diarrhea and constipation.  The patient has presented predominantly constipation which is likely related to his current  diet.  It is important for him to change the dietary habits to improve bowel movements.  I advised him to start taking MiraLAX to improve his bowel movement frequency which she will start implementing.  He is due for colorectal cancer screening as he has never had one in the past, will schedule colonoscopy.  - Schedule colonoscopy - Eat prune and/or kiwi daily - Start taking Miralax 1 cap every day for one week. If bowel movements do not improve, increase to 1 cup every 12 hours. If after two weeks there is no improvement, increase to 1 cup every 8 hours  All questions were answered.      John Peppers, MD Gastroenterology and Hepatology [facility] '

## 2020-09-27 NOTE — Patient Instructions (Signed)
Schedule colonoscopy Start taking Miralax 1 cap every day for one week. If bowel movements do not improve, increase to 1 cup every 12 hours. If after two weeks there is no improvement, increase to 1 cup every 8 hours

## 2020-10-11 ENCOUNTER — Other Ambulatory Visit (HOSPITAL_COMMUNITY)
Admission: RE | Admit: 2020-10-11 | Discharge: 2020-10-11 | Disposition: A | Discharge status: Home / Self Care | Payer: HMO | Source: Ambulatory Visit | Attending: Gastroenterology | Admitting: Gastroenterology

## 2020-10-11 ENCOUNTER — Encounter (INDEPENDENT_AMBULATORY_CARE_PROVIDER_SITE_OTHER): Payer: Self-pay

## 2020-10-21 ENCOUNTER — Other Ambulatory Visit (INDEPENDENT_AMBULATORY_CARE_PROVIDER_SITE_OTHER): Payer: Self-pay

## 2020-10-21 DIAGNOSIS — Z01812 Encounter for preprocedural laboratory examination: Secondary | ICD-10-CM

## 2020-10-28 DIAGNOSIS — I5189 Other ill-defined heart diseases: Secondary | ICD-10-CM | POA: Insufficient documentation

## 2020-10-28 NOTE — Progress Notes (Signed)
Cardiology Office Note   Date:  10/29/2020   ID:  John Hicks, John Hicks Feb 13, 1951, MRN [id number]  PCP:  Redmond School, MD  Cardiologist:   Minus Breeding, MD Referring:  None  Chief Complaint  Patient presents with   LVH       History of Present Illness: John Hicks is a 70 y.o. male who presents for follow up of atrial fib.  He has had endovascular stenting to an AAA in Sept 2020.  He has history of biventricular HF with EF 10 -15% in 2018.  However, EF was was 65% in 2020 with severe LVH.    He returns today for follow-up.  Since I last saw him he has done well. The patient denies any new symptoms such as chest discomfort, neck or arm discomfort. There has been no new shortness of breath, PND or orthopnea. There have been no reported palpitations, presyncope or syncope.   Past Medical History:  Diagnosis Date   AAA (abdominal aortic aneurysm) (HCC)    AF (atrial fibrillation) (HCC)    CHF (congestive heart failure) ([place])    Dyspnea    Hypertension     Past Surgical History:  Procedure Laterality Date   ABDOMINAL AORTIC ENDOVASCULAR STENT GRAFT N/A 02/03/2019   Procedure: ABDOMINAL AORTIC ENDOVASCULAR STENT GRAFT;  Surgeon: Waynetta Sandy, MD;  Location: [facility];  Service: Vascular;  Laterality: N/A;   APPENDECTOMY     KNEE SURGERY     ULTRASOUND GUIDANCE FOR VASCULAR ACCESS  02/03/2019   Procedure: Ultrasound Guidance For Vascular Access;  Surgeon: Waynetta Sandy, MD;  Location: [facility];  Service: Vascular;;     Current Outpatient Medications  Medication Sig Dispense Refill   ALPRAZolam (XANAX) 0.5 MG tablet Take 0.5 mg by mouth 3 (three) times daily as needed for anxiety.     amiodarone (PACERONE) 200 MG tablet Take 200 mg by mouth daily.  0   aspirin 81 MG chewable tablet Chew 81 mg by mouth daily.     Coenzyme Q10 (COQ10) 200 MG CAPS Take 200 mg by mouth daily.     Flaxseed, Linseed, (FLAXSEED OIL) 1000 MG CAPS Take 1,000 mg by mouth daily.      furosemide (LASIX) 20 MG tablet Take 40 mg by mouth daily.     polyethylene glycol-electrolytes (TRILYTE) 420 g solution Take 4,000 mLs by mouth as directed. 4000 mL 0   spironolactone (ALDACTONE) 25 MG tablet Take 0.5 tablets (12.5 mg total) by mouth daily. (Patient taking differently: Take 25 mg by mouth daily.) 30 tablet 6   No current facility-administered medications for this visit.    Allergies:   Codeine    ROS:  Please see the history of present illness.   Otherwise, review of systems are positive for none.   All other systems are reviewed and negative.    PHYSICAL EXAM: VS:  BP 124/84   Pulse 100   Ht 6' (1.829 m)   Wt 181 lb (82.1 kg)   BMI 24.55 kg/m  , BMI Body mass index is 24.55 kg/m. GENERAL:  Well appearing NECK:  No jugular venous distention, waveform within normal limits, carotid upstroke brisk and symmetric, no bruits, no thyromegaly LUNGS:  Clear to auscultation bilaterally CHEST:  Unremarkable HEART:  PMI not displaced or sustained,S1 and S2 within normal limits, no S3, no S4, no clicks, no rubs, no murmurs ABD:  Flat, positive bowel sounds normal in frequency in pitch, no bruits, no rebound, no  guarding, no midline pulsatile mass, no hepatomegaly, no splenomegaly EXT:  2 plus pulses throughout, no edema, no cyanosis no clubbing  EKG:  EKG is not ordered today.   Recent Labs: 04/22/2020: ALT 16; BUN 30; Creatinine, Ser 1.81; Potassium 4.9; Sodium 136; TSH 4.875    Lipid Panel No results found for: CHOL, TRIG, HDL, CHOLHDL, VLDL, LDLCALC, LDLDIRECT    Wt Readings from Last 3 Encounters:  10/29/20 181 lb (82.1 kg)  09/27/20 183 lb (83 kg)  07/07/20 180 lb 4.8 oz (81.8 kg)      Other studies Reviewed: Additional studies/ records that were reviewed today include: labs Review of the above records demonstrates:  Please see elsewhere in the note.     ASSESSMENT AND PLAN:  Essential hypertension His blood pressure is at target.  No change in  therapy.   Abdominal aortic aneurysm (AAA) without rupture [facility]) He had had repair of an infrarenal aortic aneurysm.  This is followed by Dr. Donzetta Matters.   Tobacco use disorder Patient understands need to quit smoking.   PAF (paroxysmal atrial fibrillation) ([place]) He remains on amiodarone.  I did not see evidence of atrial fibrillation when I saw him previously.  There was some sinus tachycardia and I was questioning whether it could be flutter but it was very brief and nonsustained and so I am not suggesting further therapy with meds such as anticoagulation.    Diastolic Dysfunction  He seems to be euvolemic.  He has severe LVH on echo and I would like to order a PYP scan.     Current medicines are reviewed at length with the patient today.  The patient does not have concerns regarding medicines.  The following changes have been made:  no change  Labs/ tests ordered today include:   Orders Placed This Encounter  Procedures   MYOCARDIAL AMYLOID IMAGING PLANAR AND SPECT      Disposition:   FU with me in 12 months.    Signed, Minus Breeding, MD  10/29/2020 1:29 PM    [facility]

## 2020-10-29 ENCOUNTER — Other Ambulatory Visit: Payer: Self-pay

## 2020-10-29 ENCOUNTER — Ambulatory Visit (INDEPENDENT_AMBULATORY_CARE_PROVIDER_SITE_OTHER): Payer: HMO | Admitting: Cardiology

## 2020-10-29 ENCOUNTER — Encounter: Payer: Self-pay | Admitting: Cardiology

## 2020-10-29 VITALS — BP 124/84 | HR 100 | Ht 72.0 in | Wt 181.0 lb

## 2020-10-29 DIAGNOSIS — I714 Abdominal aortic aneurysm, without rupture, unspecified: Secondary | ICD-10-CM

## 2020-10-29 DIAGNOSIS — F17200 Nicotine dependence, unspecified, uncomplicated: Secondary | ICD-10-CM | POA: Diagnosis not present

## 2020-10-29 DIAGNOSIS — I480 Paroxysmal atrial fibrillation: Secondary | ICD-10-CM

## 2020-10-29 DIAGNOSIS — I10 Essential (primary) hypertension: Secondary | ICD-10-CM

## 2020-10-29 DIAGNOSIS — I517 Cardiomegaly: Secondary | ICD-10-CM | POA: Diagnosis not present

## 2020-10-29 DIAGNOSIS — I5189 Other ill-defined heart diseases: Secondary | ICD-10-CM | POA: Diagnosis not present

## 2020-10-29 NOTE — Patient Instructions (Signed)
Medication Instructions:  Continue current medications  *If you need a refill on your cardiac medications before your next appointment, please call your pharmacy*   Lab Work: None Ordered   Testing/Procedures: Your physician has requested that you have a Amyloid Study(PYP). For further information please visit [email or web address]. Please follow instruction sheet, as given.    Follow-Up: At [facility], you and your health needs are our priority.  As part of our continuing mission to provide you with exceptional heart care, we have created designated Provider Care Teams.  These Care Teams include your primary Cardiologist (physician) and Advanced Practice Providers (APPs -  Physician Assistants and Nurse Practitioners) who all work together to provide you with the care you need, when you need it.  We recommend signing up for the patient portal called "MyChart".  Sign up information is provided on this After Visit Summary.  MyChart is used to connect with patients for Virtual Visits (Telemedicine).  Patients are able to view lab/test results, encounter notes, upcoming appointments, etc.  Non-urgent messages can be sent to your provider as well.   To learn more about what you can do with MyChart, go to [email or web address].    Your next appointment:   6 month(s)  The format for your next appointment:   In Person  Provider:   Minus Breeding, MD

## 2020-11-08 ENCOUNTER — Other Ambulatory Visit (HOSPITAL_COMMUNITY): Admission: RE | Admit: 2020-11-08 | Payer: HMO | Source: Ambulatory Visit

## 2020-11-08 ENCOUNTER — Other Ambulatory Visit (INDEPENDENT_AMBULATORY_CARE_PROVIDER_SITE_OTHER): Payer: Self-pay

## 2020-11-08 ENCOUNTER — Encounter (INDEPENDENT_AMBULATORY_CARE_PROVIDER_SITE_OTHER): Payer: Self-pay

## 2020-11-08 DIAGNOSIS — Z01812 Encounter for preprocedural laboratory examination: Secondary | ICD-10-CM

## 2020-11-18 DIAGNOSIS — N401 Enlarged prostate with lower urinary tract symptoms: Secondary | ICD-10-CM | POA: Diagnosis not present

## 2020-11-18 DIAGNOSIS — Z79899 Other long term (current) drug therapy: Secondary | ICD-10-CM | POA: Diagnosis not present

## 2020-11-18 DIAGNOSIS — H6992 Unspecified Eustachian tube disorder, left ear: Secondary | ICD-10-CM | POA: Diagnosis not present

## 2020-11-18 DIAGNOSIS — Z6825 Body mass index (BMI) 25.0-25.9, adult: Secondary | ICD-10-CM | POA: Diagnosis not present

## 2020-11-18 DIAGNOSIS — N1832 Chronic kidney disease, stage 3b: Secondary | ICD-10-CM | POA: Diagnosis not present

## 2020-11-18 DIAGNOSIS — T148XXA Other injury of unspecified body region, initial encounter: Secondary | ICD-10-CM | POA: Diagnosis not present

## 2020-11-18 DIAGNOSIS — E663 Overweight: Secondary | ICD-10-CM | POA: Diagnosis not present

## 2020-11-18 DIAGNOSIS — N183 Chronic kidney disease, stage 3 unspecified: Secondary | ICD-10-CM | POA: Diagnosis not present

## 2020-12-01 NOTE — Patient Instructions (Signed)
KONRAD HOAK  12/01/2020     @PREFPERIOPPHARMACY @   Your procedure is scheduled on  12/07/2020.   Report to [facility] at  0800  A.M.   Call this number if you have problems the morning of surgery:  [phone]   Remember:  Follow the diet and prep instructions given to you by the office.    Take these medicines the morning of surgery with A SIP OF WATER          xanax (if neeed), pacerone.     Do not wear jewelry, make-up or nail polish.  Do not wear lotions, powders, or perfumes, or deodorant.  Do not shave 48 hours prior to surgery.  Men may shave face and neck.  Do not bring valuables to the hospital.  [facility] is not responsible for any belongings or valuables.  Contacts, dentures or bridgework may not be worn into surgery.  Leave your suitcase in the car.  After surgery it may be brought to your room.  For patients admitted to the hospital, discharge time will be determined by your treatment team.  Patients discharged the day of surgery will not be allowed to drive home and must have someone with them for 24 hours.    Special instructions:    DO NOT smoke tobacco or vape for 24 hours before your procedure.  Please read over the following fact sheets that you were given. Anesthesia Post-op Instructions and Care and Recovery After Surgery      Colonoscopy, Adult, Care After This sheet gives you information about how to care for yourself after your procedure. Your health care provider may also give you more specific instructions. If you have problems or questions, contact your health careprovider. What can I expect after the procedure? After the procedure, it is common to have: A small amount of blood in your stool for 24 hours after the procedure. Some gas. Mild cramping or bloating of your abdomen. Follow these instructions at home: Eating and drinking  Drink enough fluid to keep your urine pale yellow. Follow instructions from your health care  provider about eating or drinking restrictions. Resume your normal diet as instructed by your health care provider. Avoid heavy or fried foods that are hard to digest.  Activity Rest as told by your health care provider. Avoid sitting for a long time without moving. Get up to take short walks every 1-2 hours. This is important to improve blood flow and breathing. Ask for help if you feel weak or unsteady. Return to your normal activities as told by your health care provider. Ask your health care provider what activities are safe for you. Managing cramping and bloating  Try walking around when you have cramps or feel bloated. Apply heat to your abdomen as told by your health care provider. Use the heat source that your health care provider recommends, such as a moist heat pack or a heating pad. Place a towel between your skin and the heat source. Leave the heat on for 20-30 minutes. Remove the heat if your skin turns bright red. This is especially important if you are unable to feel pain, heat, or cold. You may have a greater risk of getting burned.  General instructions If you were given a sedative during the procedure, it can affect you for several hours. Do not drive or operate machinery until your health care provider says that it is safe. For the first 24 hours after the procedure:  Do not sign important documents. Do not drink alcohol. Do your regular daily activities at a slower pace than normal. Eat soft foods that are easy to digest. Take over-the-counter and prescription medicines only as told by your health care provider. Keep all follow-up visits as told by your health care provider. This is important. Contact a health care provider if: You have blood in your stool 2-3 days after the procedure. Get help right away if you have: More than a small spotting of blood in your stool. Large blood clots in your stool. Swelling of your abdomen. Nausea or vomiting. A fever. Increasing  pain in your abdomen that is not relieved with medicine. Summary After the procedure, it is common to have a small amount of blood in your stool. You may also have mild cramping and bloating of your abdomen. If you were given a sedative during the procedure, it can affect you for several hours. Do not drive or operate machinery until your health care provider says that it is safe. Get help right away if you have a lot of blood in your stool, nausea or vomiting, a fever, or increased pain in your abdomen. This information is not intended to replace advice given to you by your health care provider. Make sure you discuss any questions you have with your healthcare provider. Document Revised: 05/02/2019 Document Reviewed: 12/02/2018 Elsevier Patient Education  [place] After This sheet gives you information about how to care for yourself after your procedure. Your health care provider may also give you more specific instructions. If you have problems or questions, contact your health careprovider. What can I expect after the procedure? After the procedure, it is common to have: Tiredness. Forgetfulness about what happened after the procedure. Impaired judgment for important decisions. Nausea or vomiting. Some difficulty with balance. Follow these instructions at home: For the time period you were told by your health care provider:     Rest as needed. Do not participate in activities where you could fall or become injured. Do not drive or use machinery. Do not drink alcohol. Do not take sleeping pills or medicines that cause drowsiness. Do not make important decisions or sign legal documents. Do not take care of children on your own. Eating and drinking Follow the diet that is recommended by your health care provider. Drink enough fluid to keep your urine pale yellow. If you vomit: Drink water, juice, or soup when you can drink without  vomiting. Make sure you have little or no nausea before eating solid foods. General instructions Have a responsible adult stay with you for the time you are told. It is important to have someone help care for you until you are awake and alert. Take over-the-counter and prescription medicines only as told by your health care provider. If you have sleep apnea, surgery and certain medicines can increase your risk for breathing problems. Follow instructions from your health care provider about wearing your sleep device: Anytime you are sleeping, including during daytime naps. While taking prescription pain medicines, sleeping medicines, or medicines that make you drowsy. Avoid smoking. Keep all follow-up visits as told by your health care provider. This is important. Contact a health care provider if: You keep feeling nauseous or you keep vomiting. You feel light-headed. You are still sleepy or having trouble with balance after 24 hours. You develop a rash. You have a fever. You have redness or swelling around the IV site. Get help right away  if: You have trouble breathing. You have new-onset confusion at home. Summary For several hours after your procedure, you may feel tired. You may also be forgetful and have poor judgment. Have a responsible adult stay with you for the time you are told. It is important to have someone help care for you until you are awake and alert. Rest as told. Do not drive or operate machinery. Do not drink alcohol or take sleeping pills. Get help right away if you have trouble breathing, or if you suddenly become confused. This information is not intended to replace advice given to you by your health care provider. Make sure you discuss any questions you have with your healthcare provider. Document Revised: 01/22/2020 Document Reviewed: 04/10/2019 Elsevier Patient Education  2022 [employer].

## 2020-12-03 ENCOUNTER — Encounter (HOSPITAL_COMMUNITY): Payer: Self-pay

## 2020-12-03 ENCOUNTER — Encounter (HOSPITAL_COMMUNITY)
Admission: RE | Admit: 2020-12-03 | Discharge: 2020-12-03 | Disposition: A | Discharge status: Home / Self Care | Payer: HMO | Source: Ambulatory Visit | Attending: Gastroenterology | Admitting: Gastroenterology

## 2020-12-07 ENCOUNTER — Ambulatory Visit (HOSPITAL_COMMUNITY): Admission: RE | Admit: 2020-12-07 | Payer: HMO | Source: Ambulatory Visit | Admitting: Gastroenterology

## 2020-12-07 ENCOUNTER — Encounter (HOSPITAL_COMMUNITY): Admission: RE | Payer: Self-pay | Source: Ambulatory Visit

## 2020-12-07 SURGERY — COLONOSCOPY WITH PROPOFOL
Anesthesia: Monitor Anesthesia Care

## 2020-12-30 DIAGNOSIS — I5032 Chronic diastolic (congestive) heart failure: Secondary | ICD-10-CM | POA: Diagnosis not present

## 2020-12-30 DIAGNOSIS — N1832 Chronic kidney disease, stage 3b: Secondary | ICD-10-CM | POA: Diagnosis not present

## 2020-12-30 DIAGNOSIS — I714 Abdominal aortic aneurysm, without rupture: Secondary | ICD-10-CM | POA: Diagnosis not present

## 2020-12-30 DIAGNOSIS — Z716 Tobacco abuse counseling: Secondary | ICD-10-CM | POA: Diagnosis not present

## 2020-12-30 DIAGNOSIS — I129 Hypertensive chronic kidney disease with stage 1 through stage 4 chronic kidney disease, or unspecified chronic kidney disease: Secondary | ICD-10-CM | POA: Diagnosis not present

## 2021-01-07 ENCOUNTER — Telehealth (HOSPITAL_COMMUNITY): Payer: Self-pay

## 2021-01-07 NOTE — Telephone Encounter (Signed)
Close encounter 

## 2021-01-11 ENCOUNTER — Telehealth (HOSPITAL_COMMUNITY): Payer: Self-pay | Admitting: *Deleted

## 2021-01-11 NOTE — Telephone Encounter (Signed)
Close encounter 

## 2021-01-12 ENCOUNTER — Ambulatory Visit (HOSPITAL_COMMUNITY)
Admission: RE | Admit: 2021-01-12 | Discharge: 2021-01-12 | Disposition: A | Discharge status: Home / Self Care | Payer: HMO | Source: Ambulatory Visit | Attending: Cardiology | Admitting: Cardiology

## 2021-01-12 ENCOUNTER — Other Ambulatory Visit: Payer: Self-pay

## 2021-01-12 DIAGNOSIS — I517 Cardiomegaly: Secondary | ICD-10-CM | POA: Insufficient documentation

## 2021-01-12 MED ORDER — TECHNETIUM TC 99M PYROPHOSPHATE
21.2000 | Freq: Once | INTRAVENOUS | Status: AC
  Administered 2021-01-12: 21.2 via INTRAVENOUS

## 2021-01-13 ENCOUNTER — Other Ambulatory Visit (HOSPITAL_COMMUNITY): Payer: Self-pay | Admitting: Nephrology

## 2021-01-13 ENCOUNTER — Other Ambulatory Visit (HOSPITAL_BASED_OUTPATIENT_CLINIC_OR_DEPARTMENT_OTHER): Payer: Self-pay | Admitting: Nephrology

## 2021-01-13 DIAGNOSIS — I129 Hypertensive chronic kidney disease with stage 1 through stage 4 chronic kidney disease, or unspecified chronic kidney disease: Secondary | ICD-10-CM

## 2021-01-13 DIAGNOSIS — I5032 Chronic diastolic (congestive) heart failure: Secondary | ICD-10-CM

## 2021-01-13 DIAGNOSIS — N1832 Chronic kidney disease, stage 3b: Secondary | ICD-10-CM

## 2021-01-13 DIAGNOSIS — I714 Abdominal aortic aneurysm, without rupture, unspecified: Secondary | ICD-10-CM

## 2021-01-13 NOTE — Progress Notes (Signed)
I think Dr. Percival Spanish meant to send to [facility], will forward request.

## 2021-01-17 ENCOUNTER — Telehealth: Payer: Self-pay | Admitting: Cardiology

## 2021-01-17 DIAGNOSIS — I517 Cardiomegaly: Secondary | ICD-10-CM

## 2021-01-17 NOTE — Telephone Encounter (Signed)
Follow up:    Patient returning a call back concering results.

## 2021-01-18 NOTE — Telephone Encounter (Signed)
Spoke with pt, aware of results and need for further testing. Order placed for MRI and lab orders mailed to the patient. Patient voiced understanding that lab work is needed prior to testing.

## 2021-01-20 DIAGNOSIS — I5032 Chronic diastolic (congestive) heart failure: Secondary | ICD-10-CM | POA: Diagnosis not present

## 2021-01-20 DIAGNOSIS — I129 Hypertensive chronic kidney disease with stage 1 through stage 4 chronic kidney disease, or unspecified chronic kidney disease: Secondary | ICD-10-CM | POA: Diagnosis not present

## 2021-01-20 DIAGNOSIS — N1832 Chronic kidney disease, stage 3b: Secondary | ICD-10-CM | POA: Diagnosis not present

## 2021-01-20 DIAGNOSIS — I714 Abdominal aortic aneurysm, without rupture: Secondary | ICD-10-CM | POA: Diagnosis not present

## 2021-01-31 ENCOUNTER — Other Ambulatory Visit: Payer: Self-pay

## 2021-01-31 ENCOUNTER — Ambulatory Visit (HOSPITAL_COMMUNITY)
Admission: RE | Admit: 2021-01-31 | Discharge: 2021-01-31 | Disposition: A | Discharge status: Home / Self Care | Payer: HMO | Source: Ambulatory Visit | Attending: Nephrology | Admitting: Nephrology

## 2021-01-31 DIAGNOSIS — I714 Abdominal aortic aneurysm, without rupture, unspecified: Secondary | ICD-10-CM

## 2021-01-31 DIAGNOSIS — I5032 Chronic diastolic (congestive) heart failure: Secondary | ICD-10-CM | POA: Diagnosis not present

## 2021-01-31 DIAGNOSIS — I129 Hypertensive chronic kidney disease with stage 1 through stage 4 chronic kidney disease, or unspecified chronic kidney disease: Secondary | ICD-10-CM

## 2021-01-31 DIAGNOSIS — N1832 Chronic kidney disease, stage 3b: Secondary | ICD-10-CM

## 2021-01-31 DIAGNOSIS — N183 Chronic kidney disease, stage 3 unspecified: Secondary | ICD-10-CM | POA: Diagnosis not present

## 2021-01-31 DIAGNOSIS — N281 Cyst of kidney, acquired: Secondary | ICD-10-CM | POA: Diagnosis not present

## 2021-02-02 DIAGNOSIS — N281 Cyst of kidney, acquired: Secondary | ICD-10-CM | POA: Diagnosis not present

## 2021-02-02 DIAGNOSIS — D472 Monoclonal gammopathy: Secondary | ICD-10-CM | POA: Diagnosis not present

## 2021-02-02 DIAGNOSIS — I5032 Chronic diastolic (congestive) heart failure: Secondary | ICD-10-CM | POA: Diagnosis not present

## 2021-02-02 DIAGNOSIS — N1832 Chronic kidney disease, stage 3b: Secondary | ICD-10-CM | POA: Diagnosis not present

## 2021-02-02 DIAGNOSIS — I129 Hypertensive chronic kidney disease with stage 1 through stage 4 chronic kidney disease, or unspecified chronic kidney disease: Secondary | ICD-10-CM | POA: Diagnosis not present

## 2021-02-02 DIAGNOSIS — D508 Other iron deficiency anemias: Secondary | ICD-10-CM | POA: Diagnosis not present

## 2021-03-21 ENCOUNTER — Telehealth (HOSPITAL_COMMUNITY): Payer: Self-pay | Admitting: *Deleted

## 2021-03-21 ENCOUNTER — Telehealth (HOSPITAL_COMMUNITY): Payer: Self-pay | Admitting: Emergency Medicine

## 2021-03-21 NOTE — Telephone Encounter (Signed)
Attempted to call patient regarding upcoming cardiac MRI appointment and to get blood work prior to appointment. Left message on voicemail with name and callback number  Gordy Clement RN Navigator Cardiac [facility] [phone] Office [phone] Cell

## 2021-03-21 NOTE — Telephone Encounter (Signed)
Reaching out to patient to offer assistance regarding upcoming cardiac imaging study; pt verbalizes understanding of appt date/time, parking situation and where to check in, and verified current allergies; name and call back number provided for further questions should they arise Marchia Bond RN Navigator Cardiac Imaging [facility] [phone] office [phone] cell  Denies claustro Denies implants other than cardiac stent Denies iv issues  Side note: pt states he will be out of town beginning 11/11 - 12/11. I told him he has an appt with Hochrein 12/7 at [place]. He said he was not aware of this appt, he is not used to seeing Hochrein at this office. Please call patient to r/s this appt.

## 2021-03-22 ENCOUNTER — Other Ambulatory Visit (HOSPITAL_COMMUNITY)
Admission: RE | Admit: 2021-03-22 | Discharge: 2021-03-22 | Disposition: A | Discharge status: Home / Self Care | Payer: HMO | Source: Ambulatory Visit | Attending: Cardiology | Admitting: Cardiology

## 2021-03-22 DIAGNOSIS — I517 Cardiomegaly: Secondary | ICD-10-CM | POA: Diagnosis not present

## 2021-03-22 LAB — CBC
HCT: 42.8 % (ref 39.0–52.0)
Hemoglobin: 14.3 g/dL (ref 13.0–17.0)
MCH: 31.2 pg (ref 26.0–34.0)
MCHC: 33.4 g/dL (ref 30.0–36.0)
MCV: 93.2 fL (ref 80.0–100.0)
Platelets: 187 10*3/uL (ref 150–400)
RBC: 4.59 MIL/uL (ref 4.22–5.81)
RDW: 13.3 % (ref 11.5–15.5)
WBC: 8.2 10*3/uL (ref 4.0–10.5)
nRBC: 0 % (ref 0.0–0.2)

## 2021-03-22 NOTE — Telephone Encounter (Signed)
Spoke with pt, follow up rescheduled.

## 2021-03-23 ENCOUNTER — Encounter: Payer: Self-pay | Admitting: *Deleted

## 2021-03-23 ENCOUNTER — Ambulatory Visit (HOSPITAL_COMMUNITY)
Admission: RE | Admit: 2021-03-23 | Discharge: 2021-03-23 | Disposition: A | Discharge status: Home / Self Care | Payer: HMO | Source: Ambulatory Visit | Attending: Cardiology | Admitting: Cardiology

## 2021-03-23 DIAGNOSIS — I517 Cardiomegaly: Secondary | ICD-10-CM | POA: Insufficient documentation

## 2021-03-23 MED ORDER — GADOBUTROL 1 MMOL/ML IV SOLN
8.0000 mL | Freq: Once | INTRAVENOUS | Status: AC | PRN
  Administered 2021-03-23: 8 mL via INTRAVENOUS

## 2021-03-28 DIAGNOSIS — E663 Overweight: Secondary | ICD-10-CM | POA: Diagnosis not present

## 2021-03-28 DIAGNOSIS — Z0001 Encounter for general adult medical examination with abnormal findings: Secondary | ICD-10-CM | POA: Diagnosis not present

## 2021-03-28 DIAGNOSIS — N400 Enlarged prostate without lower urinary tract symptoms: Secondary | ICD-10-CM | POA: Diagnosis not present

## 2021-03-28 DIAGNOSIS — N189 Chronic kidney disease, unspecified: Secondary | ICD-10-CM | POA: Diagnosis not present

## 2021-03-28 DIAGNOSIS — Z6824 Body mass index (BMI) 24.0-24.9, adult: Secondary | ICD-10-CM | POA: Diagnosis not present

## 2021-04-06 ENCOUNTER — Telehealth: Payer: Self-pay | Admitting: *Deleted

## 2021-04-06 DIAGNOSIS — I5032 Chronic diastolic (congestive) heart failure: Secondary | ICD-10-CM

## 2021-04-06 DIAGNOSIS — I7122 Aneurysm of the aortic arch, without rupture: Secondary | ICD-10-CM

## 2021-04-06 NOTE — Telephone Encounter (Signed)
-----   Message from Minus Breeding, MD sent at 04/05/2021  6:00 PM EST ----- I did speak with him.  He has had a known a sending aortic aneurysm but was not felt to be a surgical candidate when his ejection fraction was really low.  He and I discussed reassessing this and he would agree to having a CT angiogram of his ascending aorta.  He also needs an echocardiogram.  He will need blood work prior to the CT.  Can you please call and arrange this and enter this documentation as a results note.  Thank you.  He needs follow-up after the studies.    ----- Message ----- From: Cristopher Estimable, RN Sent: 03/28/2021   2:31 PM EST To: Minus Breeding, MD  [place] this [place]. He has an appt in [place] is that okay or do you want to do a telephone visit???

## 2021-04-06 NOTE — Telephone Encounter (Signed)
Order placed for CTA, Lab orders mailed to the pt and echo ordered.

## 2021-04-27 ENCOUNTER — Ambulatory Visit: Payer: HMO | Admitting: Cardiology

## 2021-04-27 ENCOUNTER — Other Ambulatory Visit (HOSPITAL_COMMUNITY): Payer: HMO

## 2021-05-12 DIAGNOSIS — Z720 Tobacco use: Secondary | ICD-10-CM | POA: Diagnosis not present

## 2021-05-12 DIAGNOSIS — Z6824 Body mass index (BMI) 24.0-24.9, adult: Secondary | ICD-10-CM | POA: Diagnosis not present

## 2021-05-12 DIAGNOSIS — E039 Hypothyroidism, unspecified: Secondary | ICD-10-CM | POA: Diagnosis not present

## 2021-05-12 DIAGNOSIS — N400 Enlarged prostate without lower urinary tract symptoms: Secondary | ICD-10-CM | POA: Diagnosis not present

## 2021-05-13 ENCOUNTER — Other Ambulatory Visit (HOSPITAL_COMMUNITY): Payer: HMO

## 2021-05-24 DIAGNOSIS — D508 Other iron deficiency anemias: Secondary | ICD-10-CM | POA: Diagnosis not present

## 2021-05-24 DIAGNOSIS — D472 Monoclonal gammopathy: Secondary | ICD-10-CM | POA: Diagnosis not present

## 2021-05-24 DIAGNOSIS — N281 Cyst of kidney, acquired: Secondary | ICD-10-CM | POA: Diagnosis not present

## 2021-05-24 DIAGNOSIS — I5032 Chronic diastolic (congestive) heart failure: Secondary | ICD-10-CM | POA: Diagnosis not present

## 2021-05-24 DIAGNOSIS — N1832 Chronic kidney disease, stage 3b: Secondary | ICD-10-CM | POA: Diagnosis not present

## 2021-05-24 DIAGNOSIS — I129 Hypertensive chronic kidney disease with stage 1 through stage 4 chronic kidney disease, or unspecified chronic kidney disease: Secondary | ICD-10-CM | POA: Diagnosis not present

## 2021-05-25 DIAGNOSIS — I129 Hypertensive chronic kidney disease with stage 1 through stage 4 chronic kidney disease, or unspecified chronic kidney disease: Secondary | ICD-10-CM | POA: Diagnosis not present

## 2021-05-25 DIAGNOSIS — I5032 Chronic diastolic (congestive) heart failure: Secondary | ICD-10-CM | POA: Diagnosis not present

## 2021-05-25 DIAGNOSIS — D472 Monoclonal gammopathy: Secondary | ICD-10-CM | POA: Diagnosis not present

## 2021-05-25 DIAGNOSIS — N1832 Chronic kidney disease, stage 3b: Secondary | ICD-10-CM | POA: Diagnosis not present

## 2021-05-26 DIAGNOSIS — N1832 Chronic kidney disease, stage 3b: Secondary | ICD-10-CM | POA: Diagnosis not present

## 2021-05-26 DIAGNOSIS — I9589 Other hypotension: Secondary | ICD-10-CM | POA: Diagnosis not present

## 2021-05-26 DIAGNOSIS — I5032 Chronic diastolic (congestive) heart failure: Secondary | ICD-10-CM | POA: Diagnosis not present

## 2021-05-26 DIAGNOSIS — D472 Monoclonal gammopathy: Secondary | ICD-10-CM | POA: Diagnosis not present

## 2021-05-31 ENCOUNTER — Ambulatory Visit (HOSPITAL_COMMUNITY): Payer: HMO

## 2021-06-02 ENCOUNTER — Ambulatory Visit (HOSPITAL_COMMUNITY)
Admission: RE | Admit: 2021-06-02 | Discharge: 2021-06-02 | Disposition: A | Discharge status: Home / Self Care | Payer: HMO | Source: Ambulatory Visit | Attending: Cardiology | Admitting: Cardiology

## 2021-06-02 ENCOUNTER — Other Ambulatory Visit: Payer: Self-pay

## 2021-06-02 DIAGNOSIS — I7122 Aneurysm of the aortic arch, without rupture: Secondary | ICD-10-CM | POA: Insufficient documentation

## 2021-06-02 DIAGNOSIS — I714 Abdominal aortic aneurysm, without rupture, unspecified: Secondary | ICD-10-CM | POA: Diagnosis not present

## 2021-06-02 DIAGNOSIS — I712 Thoracic aortic aneurysm, without rupture, unspecified: Secondary | ICD-10-CM | POA: Diagnosis not present

## 2021-06-02 LAB — POCT I-STAT CREATININE: Creatinine, Ser: 2.1 mg/dL — ABNORMAL HIGH (ref 0.61–1.24)

## 2021-06-02 MED ORDER — IOHEXOL 350 MG/ML SOLN
100.0000 mL | Freq: Once | INTRAVENOUS | Status: AC | PRN
  Administered 2021-06-02: 75 mL via INTRAVENOUS

## 2021-06-03 ENCOUNTER — Other Ambulatory Visit: Payer: Self-pay

## 2021-06-03 DIAGNOSIS — R7989 Other specified abnormal findings of blood chemistry: Secondary | ICD-10-CM

## 2021-06-03 MED ORDER — CARVEDILOL 3.125 MG PO TABS: 3.1250 mg | ORAL_TABLET | Freq: Two times a day (BID) | ORAL | 3 refills | Status: DC

## 2021-06-06 ENCOUNTER — Ambulatory Visit (HOSPITAL_COMMUNITY): Payer: HMO

## 2021-06-06 ENCOUNTER — Other Ambulatory Visit: Payer: HMO

## 2021-06-07 ENCOUNTER — Other Ambulatory Visit: Payer: Self-pay

## 2021-06-07 ENCOUNTER — Ambulatory Visit (HOSPITAL_COMMUNITY): Payer: HMO | Attending: Cardiovascular Disease

## 2021-06-07 ENCOUNTER — Other Ambulatory Visit: Payer: Self-pay | Admitting: Cardiology

## 2021-06-07 ENCOUNTER — Other Ambulatory Visit: Payer: HMO | Admitting: *Deleted

## 2021-06-07 DIAGNOSIS — R7989 Other specified abnormal findings of blood chemistry: Secondary | ICD-10-CM

## 2021-06-07 DIAGNOSIS — I7122 Aneurysm of the aortic arch, without rupture: Secondary | ICD-10-CM | POA: Diagnosis not present

## 2021-06-07 DIAGNOSIS — I5032 Chronic diastolic (congestive) heart failure: Secondary | ICD-10-CM

## 2021-06-07 LAB — BASIC METABOLIC PANEL
BUN/Creatinine Ratio: 20 (ref 10–24)
BUN: 40 mg/dL — ABNORMAL HIGH (ref 8–27)
CO2: 26 mmol/L (ref 20–29)
Calcium: 9.8 mg/dL (ref 8.6–10.2)
Chloride: 99 mmol/L (ref 96–106)
Creatinine, Ser: 2.04 mg/dL — ABNORMAL HIGH (ref 0.76–1.27)
Glucose: 102 mg/dL — ABNORMAL HIGH (ref 70–99)
Potassium: 5.1 mmol/L (ref 3.5–5.2)
Sodium: 133 mmol/L — ABNORMAL LOW (ref 134–144)
eGFR: 35 mL/min/{1.73_m2} — ABNORMAL LOW (ref >59)

## 2021-06-07 LAB — ECHOCARDIOGRAM COMPLETE
Area-P 1/2: 1.41 cm2
P 1/2 time: 649 msec
S' Lateral: 3.7 cm

## 2021-06-07 NOTE — Progress Notes (Signed)
Cardiology Office Note   Date:  06/08/2021   ID:  John, Hicks May 11, 1951, MRN [id number]  PCP:  Redmond School, MD  Cardiologist:   Minus Breeding, MD Referring:  None   Chief Complaint  Patient presents with   Ascending Aortic Aneurysm     History of Present Illness: John Hicks is a 71 y.o. male who presents for follow up of atrial fib.  He has had endovascular stenting to an AAA in Sept 2020.  He has history of biventricular HF with EF 10 -15% in 2018.  However, EF was was 65% in 2020 with severe LVH.    He had a follow up MRI in Nov.  This demonstrated the EF to be 44%.  The aorta was 51mm.  Hte had moderate to severe MR.  There is LEG suggestive of HCM.    CT demonstrated that his aortic root was 6.3 cm.      He returns today for follow-up.  Echo was done yesterday.    This did demonstrate that his EF was 50 to 55%.    However, he has a new right ventricular dysfunctionhas with severe hypokinesis of the free wall and apex.  I have him scheduled to see Dr. Cyndia Bent to discuss the feasibility of surgical repair of his enlarging aorta.    The patient has transportation limitations he has had multiple cancellations of studies and appointments.  He has transportation limitations.  Today he was supposed to have a follow-up in the surgical office after this appointment in [place].  However, he says his vision is a little bit blurred and he twisted his ankle and wants to have that appointment canceled.  He actually reports he is not having any new symptoms.  He is not having any chest pressure, neck or arm discomfort.  He is not having any new shortness of breath, PND or orthopnea.  He denies any palpitations, presyncope or syncope.  He is not drinking alcohol.  He is cutting down on cigarettes.  He says he is compliant with his medications. .   Past Medical History:  Diagnosis Date   AAA (abdominal aortic aneurysm)    AF (atrial fibrillation) (HCC)    CHF (congestive heart  failure) ([place])    Dyspnea    Hypertension     Past Surgical History:  Procedure Laterality Date   ABDOMINAL AORTIC ENDOVASCULAR STENT GRAFT N/A 02/03/2019   Procedure: ABDOMINAL AORTIC ENDOVASCULAR STENT GRAFT;  Surgeon: Waynetta Sandy, MD;  Location: [facility];  Service: Vascular;  Laterality: N/A;   APPENDECTOMY     KNEE SURGERY     ULTRASOUND GUIDANCE FOR VASCULAR ACCESS  02/03/2019   Procedure: Ultrasound Guidance For Vascular Access;  Surgeon: Waynetta Sandy, MD;  Location: [facility];  Service: Vascular;;     Current Outpatient Medications  Medication Sig Dispense Refill   ALPRAZolam (XANAX) 0.5 MG tablet Take 0.5 mg by mouth 3 (three) times daily as needed for anxiety.     amiodarone (PACERONE) 200 MG tablet Take 200 mg by mouth daily.  0   aspirin 81 MG chewable tablet Chew 81 mg by mouth daily.     carvedilol (COREG) 3.125 MG tablet Take 1 tablet (3.125 mg total) by mouth 2 (two) times daily. 180 tablet 3   Coenzyme Q10 (COQ10) 200 MG CAPS Take 200 mg by mouth daily.     ferrous sulfate 324 (65 Fe) MG TBEC Take 1 tablet by mouth 4 (four) times  daily.     Flaxseed, Linseed, (FLAXSEED OIL) 1000 MG CAPS Take 1,000 mg by mouth daily.     furosemide (LASIX) 20 MG tablet Take 40 mg by mouth daily.     levothyroxine (SYNTHROID) 25 MCG tablet Take 25 mcg by mouth daily.     polyethylene glycol-electrolytes (TRILYTE) 420 g solution Take 4,000 mLs by mouth as directed. 4000 mL 0   spironolactone (ALDACTONE) 25 MG tablet Take 0.5 tablets (12.5 mg total) by mouth daily. (Patient taking differently: Take 25 mg by mouth daily.) 30 tablet 6   tamsulosin (FLOMAX) 0.4 MG CAPS capsule Take 0.4 mg by mouth daily.     No current facility-administered medications for this visit.    Allergies:   Codeine    ROS:  Please see the history of present illness.   Otherwise, review of systems are positive for none.   All other systems are reviewed and negative.    PHYSICAL EXAM: VS:   BP 132/84 (BP Location: Right Arm, Patient Position: Sitting, Cuff Size: Normal)    Pulse 66    Ht 6' (1.829 m)    Wt 177 lb 3.2 oz (80.4 kg)    SpO2 97%    BMI 24.03 kg/m  , BMI Body mass index is 24.03 kg/m. GENERAL:  Well appearing NECK:  No jugular venous distention, waveform within normal limits, carotid upstroke brisk and symmetric, no bruits, no thyromegaly LUNGS:  Clear to auscultation bilaterally CHEST:  Unremarkable HEART:  PMI not displaced or sustained,S1 and S2 within normal limits, no S3, no S4, no clicks, no rubs, no murmurs ABD:  Flat, positive bowel sounds normal in frequency in pitch, no bruits, no rebound, no guarding, no midline pulsatile mass, no hepatomegaly, no splenomegaly EXT:  2 plus pulses throughout, no edema, no cyanosis no clubbing  EKG:  EKG is ordered today. Sinus rhythm, rate 66, right bundle branch block, first-degree AV block, probable old inferior infarct.   Recent Labs: 03/22/2021: Hemoglobin 14.3; Platelets 187 06/07/2021: BUN 40; Creatinine, Ser 2.04; Potassium 5.1; Sodium 133    Lipid Panel No results found for: CHOL, TRIG, HDL, CHOLHDL, VLDL, LDLCALC, LDLDIRECT    Wt Readings from Last 3 Encounters:  06/08/21 177 lb 3.2 oz (80.4 kg)  01/12/21 181 lb (82.1 kg)  10/29/20 181 lb (82.1 kg)      Other studies Reviewed: Additional studies/ records that were reviewed today include: labs Review of the above records demonstrates:  Please see elsewhere in the note.     ASSESSMENT AND PLAN:  Aortic root dilatation: He has markedly enlarged aortic root.  He has other comorbidities and would be at increased risk for surgical repair but needs to be considered for this.  Unfortunately today he cannot make it to his surgical consult.  He understands he is at high risk for sudden death with rupture of his aneurysm and will eventually comply with the surgical assessment.    Essential hypertension: At target.  No change in therapy.  Renal insufficiency  precludes starting ACE inhibitor or ARB.  I might start calcium channel blocker in the future based on his blood pressures.  He has had some hypotension in the past.  AI: For now this is medical management pending his compliance with follow-up and potential surgical repair of his aortic root.  Abdominal aortic aneurysm (AAA) without rupture [facility]): He had infrarenal aortic aneurysm repair by Dr. Donzetta Matters.  Tobacco use disorder: He is cutting back and understands this is critically important.  PAF (paroxysmal atrial fibrillation) ([place]): He remains on amiodarone.  He is maintaining sinus rhythm.  He is up-to-date with his thyroid and liver tests.  Diastolic Dysfunction : He has had some evidence of diastolic dysfunction.  There might be a suggestion of hypertrophic cardiomyopathy on his MRI.  However, he is not having any symptoms related to this.  This may be the reason for his right ventricular dysfunction but our options are limited and medical management.   Current medicines are reviewed at length with the patient today.  The patient does not have concerns regarding medicines.  The following changes have been made:  no change  Labs/ tests ordered today include:   Orders Placed This Encounter  Procedures   EKG 12-Lead      Disposition:   FU with me in 12 months.    Signed, Minus Breeding, MD  06/08/2021 4:59 PM    [facility]

## 2021-06-08 ENCOUNTER — Encounter: Payer: HMO | Admitting: Surgery

## 2021-06-08 ENCOUNTER — Encounter: Payer: Self-pay | Admitting: Cardiology

## 2021-06-08 ENCOUNTER — Other Ambulatory Visit: Payer: Self-pay

## 2021-06-08 ENCOUNTER — Ambulatory Visit (INDEPENDENT_AMBULATORY_CARE_PROVIDER_SITE_OTHER): Payer: HMO | Admitting: Cardiology

## 2021-06-08 VITALS — BP 132/84 | HR 66 | Ht 72.0 in | Wt 177.2 lb

## 2021-06-08 DIAGNOSIS — I480 Paroxysmal atrial fibrillation: Secondary | ICD-10-CM | POA: Diagnosis not present

## 2021-06-08 DIAGNOSIS — Z720 Tobacco use: Secondary | ICD-10-CM

## 2021-06-08 DIAGNOSIS — I77810 Thoracic aortic ectasia: Secondary | ICD-10-CM | POA: Diagnosis not present

## 2021-06-08 DIAGNOSIS — I10 Essential (primary) hypertension: Secondary | ICD-10-CM

## 2021-06-08 DIAGNOSIS — I5189 Other ill-defined heart diseases: Secondary | ICD-10-CM

## 2021-06-08 DIAGNOSIS — I714 Abdominal aortic aneurysm, without rupture, unspecified: Secondary | ICD-10-CM

## 2021-06-08 NOTE — Patient Instructions (Signed)
Medication Instructions:  The current medical regimen is effective;  continue present plan and medications.  *If you need a refill on your cardiac medications before your next appointment, please call your pharmacy*  Follow-Up: At [facility], you and your health needs are our priority.  As part of our continuing mission to provide you with exceptional heart care, we have created designated Provider Care Teams.  These Care Teams include your primary Cardiologist (physician) and Advanced Practice Providers (APPs -  Physician Assistants and Nurse Practitioners) who all work together to provide you with the care you need, when you need it.  We recommend signing up for the patient portal called "MyChart".  Sign up information is provided on this After Visit Summary.  MyChart is used to connect with patients for Virtual Visits (Telemedicine).  Patients are able to view lab/test results, encounter notes, upcoming appointments, etc.  Non-urgent messages can be sent to your provider as well.   To learn more about what you can do with MyChart, go to [email or web address].    Your next appointment:   6 month(s)  The format for your next appointment:   In Person  Provider:   James Hochrein, MD   Thank you for choosing [facility]!!     

## 2021-06-23 ENCOUNTER — Institutional Professional Consult (permissible substitution): Payer: HMO | Admitting: Surgery

## 2021-06-23 ENCOUNTER — Encounter: Payer: Self-pay | Admitting: Surgery

## 2021-06-23 ENCOUNTER — Other Ambulatory Visit: Payer: Self-pay

## 2021-06-23 DIAGNOSIS — I7121 Aneurysm of the ascending aorta, without rupture: Secondary | ICD-10-CM

## 2021-06-23 DIAGNOSIS — I712 Thoracic aortic aneurysm, without rupture, unspecified: Secondary | ICD-10-CM | POA: Insufficient documentation

## 2021-06-23 NOTE — Progress Notes (Signed)
Cardiothoracic Surgery Consultation  PCP is Redmond School, MD Referring Provider is Minus Breeding, MD  Chief Complaint  Patient presents with   Thoracic Aortic Aneurysm    New patient consultation CTA/ TEE 06/06/21    HPI:  The patient is a 71 year old gentleman with history of hypertension, atrial fibrillation on no anticoagulation, EVAR of AAA in 2020, aortic root aneurysm, and biventricular heart failure dating back to 2018 when his ejection fraction was noted to be 10 to 15%.  He said he developed progressive lower extremity and abdominal swelling in 2018 and spent 1 month in the hospital at [facility] getting diuresed and tuned up.  He said that he came in at 267 pounds and went home at 150 pounds.  2D echocardiogram in March 2018 showed an ejection fraction of 10 to 15% with diffuse hypokinesis and a markedly dilated left ventricle at 7.2 cm during diastole and 6.5 cm during systole.  The aortic root was measured at 4.8 cm at that time.  The aortic valve was trileaflet with no stenosis and mild to moderate regurgitation with a pressure half-time of 601 ms.  The right ventricle was moderately dilated and mildly hypokinetic at that time.  He had a follow-up echocardiogram in September 2020 which showed improved left ventricular ejection fraction to greater than 65% with a decrease in the diastolic diameter to 4 cm.  Right ventricular function was felt to be normal.  There was trivial aortic insufficiency.  The aortic root was measured at 5.2 cm.  He had a cardiac MR in November 2022 after an equivocal myocardial amyloid scan.  The MR showed an LVEF of 44%.  Right ventricular ejection fraction was 33% with mild dilation and moderate systolic dysfunction.  The aortic valve was tricuspid and was felt to have moderate to severe regurgitation with a regurgitant fraction of 32%.  The aortic root was measured at 5.5 cm.  This was felt to be a limited study since MRA was not done.  He had a follow-up  CTA of the chest done on 06/02/2021 which was not gated.  This measured the aortic root at 6.3 x 5.4 cm with an ascending aorta of 3.9 x 3.8 cm beginning at the sinotubular junction.  The aortic arch and descending thoracic aorta were around 3.3 cm.  There was some dilatation of the distal descending thoracic aorta measuring 4.7 x 4.4 cm.  There were coronary calcifications in the left and right systems.  He had another echocardiogram done on 06/07/2021 which showed a left ventricular ejection fraction of 50 to 71% with a diastolic diameter of 5.2 cm.  There is borderline concentric LVH with grade 1 diastolic dysfunction.  The right ventricular free wall and apex were severely hypokinetic.  PA systolic pressure is felt to be normal.  Right and left atrial size are normal.  There is no mitral regurgitation.  There is trivial tricuspid regurgitation.  There was mild aortic insufficiency with a pressure half-time of 649 ms.  The aortic root was measured at 5.0 cm.  The patient is here today by himself.  He lives in [place] with his family.  He denies any chest pain or pressure.  He has a little shortness of breath if he really exerts himself but not on doing his normal daily activities.  He denies exertional fatigue but does get tired toward the end of the day.  He has had no peripheral edema and no abdominal swelling.  He denies any dizziness or syncope.  He has smoked 1 pack of cigarettes per day for the last 48 years.  He smokes some marijuana.  Denies any other drug or alcohol use.  He has had all of his upper teeth extracted and has been told that he needs to have his lower teeth extracted since they are all in poor condition.  Past Medical History:  Diagnosis Date   AAA (abdominal aortic aneurysm)    AF (atrial fibrillation) (HCC)    CHF (congestive heart failure) ([place])    Dyspnea    Hypertension     Past Surgical History:  Procedure Laterality Date   ABDOMINAL AORTIC ENDOVASCULAR STENT GRAFT N/A  02/03/2019   Procedure: ABDOMINAL AORTIC ENDOVASCULAR STENT GRAFT;  Surgeon: Waynetta Sandy, MD;  Location: [place];  Service: Vascular;  Laterality: N/A;   APPENDECTOMY     KNEE SURGERY     ULTRASOUND GUIDANCE FOR VASCULAR ACCESS  02/03/2019   Procedure: Ultrasound Guidance For Vascular Access;  Surgeon: Waynetta Sandy, MD;  Location: [place];  Service: Vascular;;    Family History  Problem Relation Age of Onset   Stroke Father 74       went in the bathroom and collapsed   Multiple sclerosis Sister    Psychiatric Illness Mother 101    Social History Social History   Tobacco Use   Smoking status: Every Day    Packs/day: 1.00    Types: Cigarettes   Smokeless tobacco: Never   Tobacco comments:    ppd  Vaping Use   Vaping Use: Never used  Substance Use Topics   Alcohol use: No   Drug use: Yes    Types: Marijuana    Current Outpatient Medications  Medication Sig Dispense Refill   ALPRAZolam (XANAX) 0.5 MG tablet Take 0.5 mg by mouth 3 (three) times daily as needed for anxiety.     amiodarone (PACERONE) 200 MG tablet Take 200 mg by mouth daily.  0   aspirin 81 MG chewable tablet Chew 81 mg by mouth daily.     carvedilol (COREG) 3.125 MG tablet Take 1 tablet (3.125 mg total) by mouth 2 (two) times daily. 180 tablet 3   Coenzyme Q10 (COQ10) 200 MG CAPS Take 200 mg by mouth daily.     ferrous sulfate 324 (65 Fe) MG TBEC Take 1 tablet by mouth 4 (four) times daily.     Flaxseed, Linseed, (FLAXSEED OIL) 1000 MG CAPS Take 1,000 mg by mouth daily.     furosemide (LASIX) 20 MG tablet Take 40 mg by mouth daily.     levothyroxine (SYNTHROID) 25 MCG tablet Take 25 mcg by mouth daily.     polyethylene glycol-electrolytes (TRILYTE) 420 g solution Take 4,000 mLs by mouth as directed. 4000 mL 0   spironolactone (ALDACTONE) 25 MG tablet Take 0.5 tablets (12.5 mg total) by mouth daily. (Patient taking differently: Take 25 mg by mouth daily.) 30 tablet 6   tamsulosin (FLOMAX)  0.4 MG CAPS capsule Take 0.4 mg by mouth daily.     No current facility-administered medications for this visit.    Allergies  Allergen Reactions   Codeine Other (See Comments)    agitation    Review of Systems  Constitutional:  Negative for activity change, chills, fatigue and fever.  HENT:  Positive for dental problem.        Has been told that he needs to have his lower teeth extracted.  Previous extraction of all of his upper teeth about 3 years ago.  Eyes:        Recent change in eyesight.  Respiratory:  Negative for chest tightness and shortness of breath.   Cardiovascular:  Negative for chest pain, palpitations and leg swelling.  Gastrointestinal: Negative.   Endocrine: Negative.   Genitourinary:  Positive for frequency.       Stage IIIb chronic kidney disease  Musculoskeletal:  Negative for back pain and joint swelling.  Skin: Negative.   Allergic/Immunologic: Negative.   Neurological:  Negative for dizziness and syncope.       Memory problems Numbness in hands and feet  Hematological:  Bruises/bleeds easily.  Psychiatric/Behavioral:  The patient is nervous/anxious.    BP 131/80 (BP Location: Left Arm, Patient Position: Sitting, Cuff Size: Normal)    Pulse 65    Resp 20    Ht 6' (1.829 m)    Wt 177 lb (80.3 kg)    SpO2 98% Comment: RA   BMI 24.01 kg/m  Physical Exam Constitutional:      Appearance: Normal appearance. He is normal weight.  HENT:     Head: Normocephalic and atraumatic.  Eyes:     Extraocular Movements: Extraocular movements intact.     Conjunctiva/sclera: Conjunctivae normal.     Pupils: Pupils are equal, round, and reactive to light.  Neck:     Vascular: No carotid bruit.  Cardiovascular:     Rate and Rhythm: Normal rate and regular rhythm.     Pulses: Normal pulses.     Heart sounds: Normal heart sounds. No murmur heard. Pulmonary:     Effort: Pulmonary effort is normal.     Comments: Distant breath sounds throughout Abdominal:      General: Abdomen is flat. Bowel sounds are normal. There is no distension.     Palpations: Abdomen is soft.     Tenderness: There is no abdominal tenderness.  Musculoskeletal:        General: No swelling.     Cervical back: Normal range of motion and neck supple. No tenderness.  Skin:    General: Skin is warm and dry.  Neurological:     General: No focal deficit present.     Mental Status: He is alert and oriented to person, place, and time.  Psychiatric:        Mood and Affect: Mood normal.        Behavior: Behavior normal.     Diagnostic Tests:  ECHOCARDIOGRAM REPORT         Patient Name:   Sage Jamse Arn Date of Exam: 06/07/2021  Medical Rec #:  [id number]      Height:       72.0 in  Accession #:    [id number]     Weight:       181.0 lb  Date of Birth:  1950-09-18       BSA:          2.042 m  Patient Age:    11 years       BP:           124/84 mmHg  Patient Gender: M              HR:           54 bpm.  Exam Location:  [place]   Procedure: 2D Echo, 3D Echo, Cardiac Doppler, Color Doppler and Strain  Analysis   Indications:    I50.32 CHF     History:        Patient has prior  history of Echocardiogram examinations,  most                  recent 01/29/2019. CHF, Arrythmias:Atrial Fibrillation and  Atrial                  Flutter; Risk Factors:Hypertension and Current Smoker.                  Cardiogenic shock. NSVT. AAA.     Sonographer:    Basilia Jumbo BS, RDCS  Referring Phys: [address]      Sonographer Comments: Technically difficult study due to poor echo  windows, suboptimal subcostal window and suboptimal apical window.  IMPRESSIONS     1. Left ventricular ejection fraction, by estimation, is 50 to 55%. Left  ventricular ejection fraction by 3D volume is 55 %. The left ventricle has  low normal function. The left ventricle demonstrates regional wall motion  abnormalities (see scoring  diagram/findings for description). Left ventricular diastolic  parameters  are consistent with Grade I diastolic dysfunction (impaired relaxation).  There is moderate hypokinesis of the left ventricular, basal-mid inferior  wall and inferolateral wall. The  average left ventricular global longitudinal strain is -21.5 %. The global  longitudinal strain is normal.   2. The RV free wall and apex are severely hypokinetic, while longitudinal  function is lrelatively preserved. Right ventricular systolic function is  moderately reduced. The right ventricular size is normal. There is normal  pulmonary artery systolic  pressure.   3. The mitral valve is normal in structure. No evidence of mitral valve  regurgitation.   4. The aortic valve is tricuspid. Aortic valve regurgitation is mild to  moderate. No aortic stenosis is present.   5. Aortic dilatation noted. There is severe dilatation of the aortic  root, measuring 50 mm. There is severe dilatation of the ascending aorta,  measuring 55 mm.   Comparison(s): Prior images reviewed side by side. The left ventricular  function is worsened. The right ventricular systolic function is worse.  The left ventricular wall motion abnormalities are new. 01/29/19 EF >65%.  Ascending aorta 30mm.   FINDINGS   Left Ventricle: Left ventricular ejection fraction, by estimation, is 50  to 55%. Left ventricular ejection fraction by 3D volume is 55 %. The left  ventricle has low normal function. The left ventricle demonstrates  regional wall motion abnormalities.  Moderate hypokinesis of the left ventricular, basal-mid inferior wall and  inferolateral wall. The average left ventricular global longitudinal  strain is -21.5 %. The global longitudinal strain is normal. The left  ventricular internal cavity size was  normal in size. There is borderline concentric left ventricular  hypertrophy. Left ventricular diastolic parameters are consistent with  Grade I diastolic dysfunction (impaired relaxation). Normal left   ventricular filling pressure.      LV Wall Scoring:  The inferior wall and posterior wall are hypokinetic.   Right Ventricle: The RV free wall and apex are severely hypokinetic, while  longitudinal function is lrelatively preserved. The right ventricular size  is normal. No increase in right ventricular wall thickness. Right  ventricular systolic function is  moderately reduced. There is normal pulmonary artery systolic pressure.  The tricuspid regurgitant velocity is 2.07 m/s, and with an assumed right  atrial pressure of 5 mmHg, the estimated right ventricular systolic  pressure is 67.1 mmHg.   Left Atrium: Left atrial size was normal in size.   Right Atrium: Right atrial size was normal in size.  Pericardium: There is no evidence of pericardial effusion.   Mitral Valve: The mitral valve is normal in structure. No evidence of  mitral valve regurgitation.   Tricuspid Valve: The tricuspid valve is normal in structure. Tricuspid  valve regurgitation is trivial.   Aortic Valve: The aortic valve is tricuspid. Aortic valve regurgitation is  mild to moderate. Aortic regurgitation PHT measures 649 msec. No aortic  stenosis is present.   Pulmonic Valve: The pulmonic valve was normal in structure. Pulmonic valve  regurgitation is trivial.   Aorta: Aortic dilatation noted. There is severe dilatation of the aortic  root, measuring 50 mm. There is severe dilatation of the ascending aorta,  measuring 55 mm.   Venous: The inferior vena cava was not well visualized.   IAS/Shunts: No atrial level shunt detected by color flow Doppler.      LEFT VENTRICLE  PLAX 2D  LVIDd:         5.20 cm         Diastology  LVIDs:         3.70 cm         LV e' medial:    6.00 cm/s  LV PW:         1.20 cm         LV E/e' medial:  6.3  LV IVS:        1.20 cm         LV e' lateral:   7.95 cm/s  LVOT diam:     2.90 cm         LV E/e' lateral: 4.7  LV SV:         91  LV SV Index:   45              2D   LVOT Area:     6.61 cm        Longitudinal                                 Strain                                 2D Strain GLS  -22.2 %                                 (A2C):                                 2D Strain GLS  -23.7 %                                 (A3C):                                 2D Strain GLS  -18.6 %                                 (A4C):  2D Strain GLS  -21.5 %                                 Avg:                                    3D Volume EF                                 LV 3D EF:    Left                                              ventricul                                              ar                                              ejection                                              fraction                                              by 3D                                              volume is                                              55 %.                                    3D Volume EF:                                 3D EF:        55 %                                 LV EDV:       150 ml                                 LV ESV:  68 ml                                 LV SV:        82 ml   RIGHT VENTRICLE  RV Basal diam:  3.40 cm  RV S prime:     8.78 cm/s  TAPSE (M-mode): 1.7 cm  RVSP:           20.1 mmHg   LEFT ATRIUM             Index        RIGHT ATRIUM           Index  LA diam:        4.20 cm 2.06 cm/m   RA Pressure: 3.00 mmHg  LA Vol (A2C):   59.3 ml 29.04 ml/m  RA Area:     16.00 cm  LA Vol (A4C):   74.8 ml 36.63 ml/m  RA Volume:   45.30 ml  22.18 ml/m  LA Biplane Vol: 68.6 ml 33.59 ml/m   AORTIC VALVE  LVOT Vmax:   68.15 cm/s  LVOT Vmean:  42.200 cm/s  LVOT VTI:    0.138 m  AI PHT:      649 msec     AORTA  Ao Root diam: 5.00 cm  Ao Asc diam:  5.47 cm   MITRAL VALVE               TRICUSPID VALVE                             TR Peak grad:   17.1 mmHg  MV Decel Time: 538 msec    TR  Vmax:        207.00 cm/s  MV E velocity: 37.70 cm/s  Estimated RAP:  3.00 mmHg  MV A velocity: 59.70 cm/s  RVSP:           20.1 mmHg  MV E/A ratio:  0.63                             SHUNTS                             Systemic VTI:  0.14 m                             Systemic Diam: 2.90 cm   Mihai Croitoru MD  Electronically signed by Sanda Klein MD  Signature Date/Time: 06/07/2021/3:51:45 PM      Narrative & Impression  CLINICAL DATA:  Thoracic aortic aneurysm (TAA), follow up   EXAM: CT ANGIOGRAPHY CHEST WITH CONTRAST   TECHNIQUE: Multidetector CT imaging of the chest was performed using the standard protocol during bolus administration of intravenous contrast. Multiplanar CT image reconstructions and MIPs were obtained to evaluate the vascular anatomy.   RADIATION DOSE REDUCTION: This exam was performed according to the departmental dose-optimization program which includes automated exposure control, adjustment of the mA and/or kV according to patient size and/or use of iterative reconstruction technique.   CONTRAST:  17mL OMNIPAQUE IOHEXOL 350 MG/ML SOLN   COMPARISON:  Cardiac MRI November 2022, CTA abdomen and pelvis February 2021   FINDINGS: Cardiovascular: Preferential opacification of the thoracic aorta. Measurements of the  thoracic aorta are taken as follows:   Aortic root: 6.3 x 5.4 cm   Sino-tubular junction: 3.9 x 3.8 cm   Ascending thoracic aorta 3.9 x 3.8 cm   Aortic arch 3.3 x 3.2 cm   Descending thoracic aorta, mid: 3.4 x 3.3 cm   Descending thoracic aorta, distal 4.7 x 4.4 cm   Coronary artery calcifications. No pericardial effusion. The pulmonary arteries are normal caliber.   Mediastinum/Nodes: No enlarged mediastinal, hilar, or axillary lymph nodes. There is a 1.6 cm nodule in the right thyroid lobe.   Lungs/Pleura: No pleural effusion. No pneumothorax. No mass or focal consolidation. No suspicious pulmonary nodules.   Musculoskeletal:  No aggressive osseous lesions. Scattered degenerative changes of the lower thoracic spine.   Upper abdomen: There is a 1.2 cm right adrenal gland nodule, unchanged from prior CT in February 2021 where it was found to measure less than 10 Hounsfield units, compatible with a benign lipid rich adrenal adenoma. Partially visualized previous AAA repair. Stable left upper pole simple renal cyst.   Review of the MIP images confirms the above findings.   IMPRESSION: 1. 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 Consultation with cardiothoracic/vascular surgery is recommended. 2. Distal descending thoracic aortic aneurysm measures up to 4.7 cm at its widest point near the diaphragmatic hiatus, contiguous with previously treated abdominal aortic aneurysm. The ascending thoracic aorta measures at the upper limit of normal at 3.9 cm, and the aortic arch is normal in caliber. 3. There is a 1.6 cm nodule in the right thyroid lobe. If not previously evaluated, dedicated thyroid ultrasound is recommended.     Electronically Signed   By: Albin Felling M.D.   On: 06/03/2021 08:01       Impression:  This 71 year old gentleman has an aortic root aneurysm that was measured at 5.0 cm by recent echocardiogram and 6.3 x 5.4 cm by recent CTA of the chest.  His echo had one measurement that was 5.8 cm which I think is completely inaccurate and I suspect the CT measurement of 6.3 cm is an overestimation of the sinus diameter.  By my measurements I think the aortic root diameter is somewhere between 5 and 5.5 cm.  The aorta from the sinotubular junction onward appears fairly normal size.  His echocardiogram shows mild aortic insufficiency with a trileaflet aortic valve.  His cardiac MR in 03/2021  showed moderate to severe aortic insufficiency with a regurgitant fraction of 32%.  I cannot reconcile this difference unless the AI is eccentric and just not visualized on his echo.   He has no murmur  on exam and is asymptomatic.  His most recent echo does show severe RV dysfunction despite normal PA pressures.  He has never had a right or left heart cath.  His aortic root aneurysm is at the surgical threshold but I think his surgical risk would be very high due to severe RV systolic dysfunction, stage IIIb chronic kidney disease with a creatinine of 2.0, and a 48-pack-year smoking history with active 1 pack/day smoking.  I think at this time his operative risk is higher than the risk of further enlargement of his aorta and aortic dissection.  It also concerns me that his left heart showed signs of severe dysfunction with severe heart failure in the past that improved without an etiology found for his LV dysfunction and now his RV is severely hypokinetic with improvement in his LV systolic function but no clear etiology for  his RV dysfunction.  I think the best option for him is to continue close follow-up of his ventricular function and aortic insufficiency as well as aortic root aneurysm with 2D echocardiogram.  I do not think there is a need to do follow-up CTA of the chest unless there is some significant change on echocardiogram to avoid the need of contrast renal injury.  I reviewed the CTA and echo images with him and answered all of his questions.  He is in agreement with continued follow-up.  I think this can be done by Dr. Percival Spanish with an echocardiogram every 6 to 12 months.  I will be happy to see the patient back if there is some significant change.  I stressed the importance of good blood pressure control in preventing further enlargement of his aorta and acute aortic dissection.  I advised him against doing any strenuous lifting that may require a Valsalva maneuver and could suddenly raise his blood pressure to high levels.  Plan:  He will continue to follow-up with Dr. Percival Spanish with periodic echocardiograms.  I will be happy to see him back if there is any significant change.  I spent 60  minutes performing this consultation and > 50% of this time was spent face to face counseling and coordinating the care of this patient's aortic root aneurysm.   Gaye Pollack, MD [facility] [phone]

## 2021-06-27 ENCOUNTER — Telehealth: Payer: Self-pay | Admitting: *Deleted

## 2021-06-27 DIAGNOSIS — F419 Anxiety disorder, unspecified: Secondary | ICD-10-CM | POA: Diagnosis not present

## 2021-06-27 DIAGNOSIS — H6121 Impacted cerumen, right ear: Secondary | ICD-10-CM | POA: Diagnosis not present

## 2021-06-27 DIAGNOSIS — Z6824 Body mass index (BMI) 24.0-24.9, adult: Secondary | ICD-10-CM | POA: Diagnosis not present

## 2021-06-27 DIAGNOSIS — I77810 Thoracic aortic ectasia: Secondary | ICD-10-CM

## 2021-06-27 DIAGNOSIS — E039 Hypothyroidism, unspecified: Secondary | ICD-10-CM | POA: Diagnosis not present

## 2021-06-27 DIAGNOSIS — Z1331 Encounter for screening for depression: Secondary | ICD-10-CM | POA: Diagnosis not present

## 2021-06-27 DIAGNOSIS — E663 Overweight: Secondary | ICD-10-CM | POA: Diagnosis not present

## 2021-06-27 NOTE — Telephone Encounter (Signed)
-----   Message from Minus Breeding, MD sent at 06/25/2021  9:41 PM EST ----- Repeat an echo in six months.    ----- Message ----- From: Gaye Pollack, MD Sent: 06/23/2021   5:35 PM EST To: Minus Breeding, MD

## 2021-08-25 DIAGNOSIS — D472 Monoclonal gammopathy: Secondary | ICD-10-CM | POA: Diagnosis not present

## 2021-08-25 DIAGNOSIS — N1832 Chronic kidney disease, stage 3b: Secondary | ICD-10-CM | POA: Diagnosis not present

## 2021-08-25 DIAGNOSIS — I5032 Chronic diastolic (congestive) heart failure: Secondary | ICD-10-CM | POA: Diagnosis not present

## 2021-08-25 DIAGNOSIS — I9589 Other hypotension: Secondary | ICD-10-CM | POA: Diagnosis not present

## 2021-09-15 DIAGNOSIS — N1832 Chronic kidney disease, stage 3b: Secondary | ICD-10-CM | POA: Diagnosis not present

## 2021-09-15 DIAGNOSIS — I129 Hypertensive chronic kidney disease with stage 1 through stage 4 chronic kidney disease, or unspecified chronic kidney disease: Secondary | ICD-10-CM | POA: Diagnosis not present

## 2021-09-15 DIAGNOSIS — R809 Proteinuria, unspecified: Secondary | ICD-10-CM | POA: Diagnosis not present

## 2021-09-15 DIAGNOSIS — I5032 Chronic diastolic (congestive) heart failure: Secondary | ICD-10-CM | POA: Diagnosis not present

## 2021-09-26 DIAGNOSIS — N400 Enlarged prostate without lower urinary tract symptoms: Secondary | ICD-10-CM | POA: Diagnosis not present

## 2021-09-26 DIAGNOSIS — N189 Chronic kidney disease, unspecified: Secondary | ICD-10-CM | POA: Diagnosis not present

## 2021-09-26 DIAGNOSIS — Z0001 Encounter for general adult medical examination with abnormal findings: Secondary | ICD-10-CM | POA: Diagnosis not present

## 2021-09-26 DIAGNOSIS — N183 Chronic kidney disease, stage 3 unspecified: Secondary | ICD-10-CM | POA: Diagnosis not present

## 2021-09-26 DIAGNOSIS — Z6822 Body mass index (BMI) 22.0-22.9, adult: Secondary | ICD-10-CM | POA: Diagnosis not present

## 2021-09-26 DIAGNOSIS — F419 Anxiety disorder, unspecified: Secondary | ICD-10-CM | POA: Diagnosis not present

## 2021-09-26 DIAGNOSIS — Z125 Encounter for screening for malignant neoplasm of prostate: Secondary | ICD-10-CM | POA: Diagnosis not present

## 2021-09-26 DIAGNOSIS — E039 Hypothyroidism, unspecified: Secondary | ICD-10-CM | POA: Diagnosis not present

## 2021-09-26 DIAGNOSIS — Z1331 Encounter for screening for depression: Secondary | ICD-10-CM | POA: Diagnosis not present

## 2021-10-06 DIAGNOSIS — E039 Hypothyroidism, unspecified: Secondary | ICD-10-CM | POA: Diagnosis not present

## 2021-10-19 DIAGNOSIS — E875 Hyperkalemia: Secondary | ICD-10-CM | POA: Diagnosis not present

## 2021-10-21 ENCOUNTER — Emergency Department (HOSPITAL_COMMUNITY): Payer: No Typology Code available for payment source

## 2021-10-21 ENCOUNTER — Other Ambulatory Visit: Payer: Self-pay

## 2021-10-21 ENCOUNTER — Encounter (HOSPITAL_COMMUNITY): Payer: Self-pay

## 2021-10-21 ENCOUNTER — Emergency Department (HOSPITAL_COMMUNITY)
Admission: EM | Admit: 2021-10-21 | Discharge: 2021-10-21 | Disposition: A | Discharge status: Home / Self Care | Payer: No Typology Code available for payment source | Attending: Emergency Medicine | Admitting: Emergency Medicine

## 2021-10-21 DIAGNOSIS — D696 Thrombocytopenia, unspecified: Secondary | ICD-10-CM | POA: Insufficient documentation

## 2021-10-21 DIAGNOSIS — D649 Anemia, unspecified: Secondary | ICD-10-CM | POA: Insufficient documentation

## 2021-10-21 DIAGNOSIS — N451 Epididymitis: Secondary | ICD-10-CM | POA: Diagnosis not present

## 2021-10-21 DIAGNOSIS — Z7982 Long term (current) use of aspirin: Secondary | ICD-10-CM | POA: Diagnosis not present

## 2021-10-21 DIAGNOSIS — N50812 Left testicular pain: Secondary | ICD-10-CM | POA: Diagnosis present

## 2021-10-21 LAB — CBC WITH DIFFERENTIAL/PLATELET
Abs Immature Granulocytes: 0.02 10*3/uL (ref 0.00–0.07)
Basophils Absolute: 0 10*3/uL (ref 0.0–0.1)
Basophils Relative: 0 %
Eosinophils Absolute: 0 10*3/uL (ref 0.0–0.5)
Eosinophils Relative: 0 %
HCT: 32.8 % — ABNORMAL LOW (ref 39.0–52.0)
Hemoglobin: 11.3 g/dL — ABNORMAL LOW (ref 13.0–17.0)
Immature Granulocytes: 0 %
Lymphocytes Relative: 13 %
Lymphs Abs: 0.8 10*3/uL (ref 0.7–4.0)
MCH: 34.3 pg — ABNORMAL HIGH (ref 26.0–34.0)
MCHC: 34.5 g/dL (ref 30.0–36.0)
MCV: 99.7 fL (ref 80.0–100.0)
Monocytes Absolute: 0.7 10*3/uL (ref 0.1–1.0)
Monocytes Relative: 11 %
Neutro Abs: 4.3 10*3/uL (ref 1.7–7.7)
Neutrophils Relative %: 76 %
Platelets: 125 10*3/uL — ABNORMAL LOW (ref 150–400)
RBC: 3.29 MIL/uL — ABNORMAL LOW (ref 4.22–5.81)
RDW: 13.8 % (ref 11.5–15.5)
WBC: 5.8 10*3/uL (ref 4.0–10.5)
nRBC: 0 % (ref 0.0–0.2)

## 2021-10-21 LAB — BASIC METABOLIC PANEL
Anion gap: 8 (ref 5–15)
BUN: 29 mg/dL — ABNORMAL HIGH (ref 8–23)
CO2: 25 mmol/L (ref 22–32)
Calcium: 9.1 mg/dL (ref 8.9–10.3)
Chloride: 105 mmol/L (ref 98–111)
Creatinine, Ser: 1.9 mg/dL — ABNORMAL HIGH (ref 0.61–1.24)
GFR, Estimated: 38 mL/min — ABNORMAL LOW (ref >60)
Glucose, Bld: 113 mg/dL — ABNORMAL HIGH (ref 70–99)
Potassium: 3.8 mmol/L (ref 3.5–5.1)
Sodium: 138 mmol/L (ref 135–145)

## 2021-10-21 LAB — URINALYSIS, ROUTINE W REFLEX MICROSCOPIC
Bilirubin Urine: NEGATIVE
Glucose, UA: NEGATIVE mg/dL
Ketones, ur: NEGATIVE mg/dL
Nitrite: POSITIVE — AB
Protein, ur: 30 mg/dL — AB
Specific Gravity, Urine: 1.013 (ref 1.005–1.030)
WBC, UA: >50 WBC/hpf — ABNORMAL HIGH (ref 0–5)
pH: 5 (ref 5.0–8.0)

## 2021-10-21 MED ORDER — SULFAMETHOXAZOLE-TRIMETHOPRIM 800-160 MG PO TABS
1.0000 | ORAL_TABLET | Freq: Two times a day (BID) | ORAL | 0 refills | Status: DC
  Filled 2021-10-21: qty 14, 7d supply, fill #0

## 2021-10-21 MED ORDER — SULFAMETHOXAZOLE-TRIMETHOPRIM 800-160 MG PO TABS: 1.0000 | ORAL_TABLET | Freq: Two times a day (BID) | ORAL | 0 refills | Status: AC

## 2021-10-21 MED ORDER — SODIUM CHLORIDE 0.9 % IV BOLUS
500.0000 mL | Freq: Once | INTRAVENOUS | Status: AC
  Administered 2021-10-21: 500 mL via INTRAVENOUS

## 2021-10-21 MED ORDER — HYDROCODONE-ACETAMINOPHEN 5-325 MG PO TABS
1.0000 | ORAL_TABLET | Freq: Once | ORAL | Status: AC
  Administered 2021-10-21: 1 via ORAL
  Filled 2021-10-21: qty 1

## 2021-10-21 MED ORDER — LEVOFLOXACIN 500 MG PO TABS
500.0000 mg | ORAL_TABLET | Freq: Once | ORAL | Status: AC
  Administered 2021-10-21: 500 mg via ORAL
  Filled 2021-10-21: qty 1

## 2021-10-21 NOTE — Discharge Instructions (Addendum)
Take Bactrim twice daily for the next 7 days. You will need to follow-up with urology, information provided above.  Please call to schedule an appointment for reevaluation.  Your blood pressure was low today, it improved throughout the ED but if you are feeling lightheaded or dizzy you should return back to ED.  Follow-up with your primary next week for reevaluation

## 2021-10-21 NOTE — ED Provider Triage Note (Signed)
Emergency Medicine Provider Triage Evaluation Note  John Hicks , a 71 y.o. male  was evaluated in triage.  Pt complains of testicular pain.  Pain and swelling involving L testicle x 2 days.  No heavy lifting, no new sexual lpartner, no fever, or dysuria  Review of Systems  Positive: As above Negative: As above  Physical Exam  There were no vitals taken for this visit. Gen:   Awake, no distress   Resp:  Normal effort  MSK:   Moves extremities without difficulty  Other:  L testicle moderately edematous and tender with abnormal lie.  No hernia.  Chaperone present  Medical Decision Making  Medically screening exam initiated at 12:09 PM.  Appropriate orders placed.  [name] was informed that the remainder of the evaluation will be completed by another provider, this initial triage assessment does not replace that evaluation, and the importance of remaining in the ED until their evaluation is complete.  Concerns for testicular torsion vs epididymitis.  Doubt inguinal hernia   Domenic Moras, PA-C 10/21/21 1214

## 2021-10-21 NOTE — ED Triage Notes (Signed)
Patient complains of left testicular pain for the last few days.  Denies swelling.  Denies urinary issues.

## 2021-10-21 NOTE — ED Notes (Signed)
Notified Tegeler MD about pt orthostatic VS. Pt asymptomatic the entire time despite hypotension.

## 2021-10-21 NOTE — ED Provider Notes (Signed)
Patient presents with left-sided testicular pain and swelling.  This was going on for 2 days.  Patient care taken over at shift change, pending UA.  Patient denies feeling lightheaded or dizzy but he is hypotensive with a systolic blood pressure of 88.  He is ambulatory with a steady gait, he denies any symptoms.  He is mostly concerned about the testicular pain and swelling.  Physical Exam  BP 125/67   Pulse 75   Temp 98.9 F (37.2 C) (Oral)   Resp 19   Ht 6' (1.829 m)   Wt 76.2 kg   SpO2 100%   BMI 22.78 kg/m   Physical Exam Vitals and nursing note reviewed. Exam conducted with a chaperone present.  Constitutional:      General: He is not in acute distress.    Appearance: Normal appearance.  HENT:     Head: Normocephalic and atraumatic.  Eyes:     General: No scleral icterus.    Extraocular Movements: Extraocular movements intact.     Pupils: Pupils are equal, round, and reactive to light.  Genitourinary:    Comments: Left-sided testicular pain and swelling, slight erythema Skin:    Coloration: Skin is not jaundiced.  Neurological:     Mental Status: He is alert. Mental status is at baseline.     Coordination: Coordination normal.    Procedures  Procedures  ED Course / MDM    Medical Decision Making Amount and/or Complexity of Data Reviewed Labs: ordered.  Risk Prescription drug management.   I reviewed previous work-up.  Patient has epididymitis to the left, he also has signs of a UTI in his UA.  Plan is to put patient on Bactrim, he was given a dose of Claritin here in the ED but given history of renal disease and concern for QT prolongation will do Bactrim instead.  He has a slight AKI of 1.9 but this is not grossly increased compared to baseline.  No leukocytosis slight anemia with a hemoglobin of 1.3.  Patient was orthostatic, given 500 L bolus of fluids and observed on cardiac monitoring.  I viewed cardiac monitoring patient is in sinus rhythm.  He has been  ambulated twice without any change in vitals and has steady gait without symptoms of lightheaded or dizziness.  We engaged in shared decision-making.  Blood pressures been trending upward, currently 125/70.  Given this improvement and his lack of symptoms I do not feel strongly he needs admission for hypotension, I feel he is stable for discharge home with antibiotics.  Discussed with patient and he is in agreement with this, we discussed tricked return precautions including lightheadedness, dizziness, worsening symptoms and he verbalized understanding.  Patient discharged in stable condition.       Sherrill Raring, PA-C 10/21/21 1841    Tegeler, Gwenyth Allegra, MD 10/21/21 2106

## 2021-10-21 NOTE — ED Triage Notes (Signed)
Assessment reveals of swelling and redness.

## 2021-10-21 NOTE — ED Provider Notes (Signed)
[facility] EMERGENCY DEPARTMENT Provider Note   CSN: [id number] Arrival date & time: 10/21/21  1200     History  Chief Complaint  Patient presents with   Testicle Pain    John Hicks is a 71 y.o. male.  Patient with history of chronic kidney disease presents to the emergency department for evaluation of left-sided scrotal edema and swelling starting about 2 days ago.  He awoke this morning with significantly worsened swelling to the testicle including pain and overlying redness.  Patient denies fever, nausea or vomiting.  He denies sexual activity.  No treatments prior to arrival.  Otherwise no chest pain, abdominal pain, diarrhea.  No dysuria, increased frequency or urgency.      Home Medications Prior to Admission medications   Medication Sig Start Date End Date Taking? Authorizing Provider  ALPRAZolam Duanne Moron) 0.5 MG tablet Take 0.5 mg by mouth 3 (three) times daily as needed for anxiety. 07/06/20   [provider]  amiodarone (PACERONE) 200 MG tablet Take 200 mg by mouth daily. 08/23/16   [provider]  aspirin 81 MG chewable tablet Chew 81 mg by mouth daily.    [provider]  carvedilol (COREG) 3.125 MG tablet Take 1 tablet (3.125 mg total) by mouth 2 (two) times daily. 06/03/21 09/01/21  Minus Breeding, MD  Coenzyme Q10 (COQ10) 200 MG CAPS Take 200 mg by mouth daily.    [provider]  ferrous sulfate 324 (65 Fe) MG TBEC Take 1 tablet by mouth 4 (four) times daily. 02/02/21   [provider]  Flaxseed, Linseed, (FLAXSEED OIL) 1000 MG CAPS Take 1,000 mg by mouth daily.    [provider]  furosemide (LASIX) 20 MG tablet Take 40 mg by mouth daily.    [provider]  levothyroxine (SYNTHROID) 25 MCG tablet Take 25 mcg by mouth daily. 03/29/21   [provider]  polyethylene glycol-electrolytes (TRILYTE) 420 g solution Take 4,000 mLs by mouth as directed. 09/27/20   Harvel Quale,  MD  spironolactone (ALDACTONE) 25 MG tablet Take 0.5 tablets (12.5 mg total) by mouth daily. Patient taking differently: Take 25 mg by mouth daily. 10/02/16   Clegg, Amy D, NP  tamsulosin (FLOMAX) 0.4 MG CAPS capsule Take 0.4 mg by mouth daily. 03/28/21   [provider]      Allergies    Codeine    Review of Systems   Review of Systems  Physical Exam Updated Vital Signs BP 90/60 (BP Location: Right Arm)   Pulse 94   Temp 98.9 F (37.2 C) (Oral)   Resp 11   Ht 6' (1.829 m)   Wt 76.2 kg   SpO2 100%   BMI 22.78 kg/m   Physical Exam Vitals and nursing note reviewed.  Constitutional:      General: He is not in acute distress.    Appearance: He is well-developed.  HENT:     Head: Normocephalic and atraumatic.  Eyes:     General:        Right eye: No discharge.        Left eye: No discharge.     Conjunctiva/sclera: Conjunctivae normal.  Cardiovascular:     Rate and Rhythm: Normal rate and regular rhythm.     Heart sounds: Normal heart sounds.  Pulmonary:     Effort: Pulmonary effort is normal.     Breath sounds: Normal breath sounds.  Abdominal:     Palpations: Abdomen is soft.  Tenderness: There is no abdominal tenderness. There is no guarding or rebound.  Genitourinary:    Penis: Uncircumcised.      Testes:        Left: Tenderness and swelling present. Mass not present.     Comments: Left-sided scrotal swelling, overlying erythema without abscess.  Area is very tender.  Right side appears normal. Musculoskeletal:     Cervical back: Normal range of motion and neck supple.  Skin:    General: Skin is warm and dry.  Neurological:     Mental Status: He is alert.    ED Results / Procedures / Treatments   Labs (all labs ordered are listed, but only abnormal results are displayed) Labs Reviewed  BASIC METABOLIC PANEL - Abnormal; Notable for the following components:      Result Value   Glucose, Bld 113 (*)    BUN 29 (*)    Creatinine, Ser 1.90 (*)     GFR, Estimated 38 (*)    All other components within normal limits  CBC WITH DIFFERENTIAL/PLATELET - Abnormal; Notable for the following components:   RBC 3.29 (*)    Hemoglobin 11.3 (*)    HCT 32.8 (*)    MCH 34.3 (*)    Platelets 125 (*)    All other components within normal limits  URINE CULTURE  URINALYSIS, ROUTINE W REFLEX MICROSCOPIC  GC/CHLAMYDIA PROBE AMP ([place]) NOT AT [facility]    EKG None  Radiology US SCROTUM W/DOPPLER  Result Date: 10/21/2021 CLINICAL DATA:  Scrotal pain. EXAM: SCROTAL ULTRASOUND DOPPLER ULTRASOUND OF THE TESTICLES TECHNIQUE: Complete ultrasound examination of the testicles, epididymis, and other scrotal structures was performed. Color and spectral Doppler ultrasound were also utilized to evaluate blood flow to the testicles. COMPARISON:  None Available. FINDINGS: Right testicle Measurements: 0.0 x 2.5 x 2.1 cm. No mass or microlithiasis visualized. Left testicle Measurements: 3.9 x 2.7 x 2.2 cm. No mass or microlithiasis visualized. Right epididymis:  Normal in size and appearance. Left epididymis: Complicated prominent cyst enlarges the epididymal head, smaller cyst along the epididymal body/tail. Largest cyst measures 3 cm in greatest dimension. Relative increased vascularity of the right epididymis. Hydrocele: Complex right hydrocele with multiple internal septations. Varicocele:  None visualized. Pulsed Doppler interrogation of both testes demonstrates normal low resistance arterial and venous waveforms bilaterally. IMPRESSION: 1. Findings consistent with right-sided epididymitis. Enlarged right epididymis, predominantly the epididymal head, with epididymal cysts, largest a complicated cyst measuring 3 cm arising from the head. Increased right epididymal blood flow and complex right hydrocele. 2. No testicular mass or torsion. Electronically Signed   By: Lajean Manes M.D.   On: 10/21/2021 13:03    Procedures Procedures    Medications Ordered in  ED Medications  HYDROcodone-acetaminophen (NORCO/VICODIN) 5-325 MG per tablet 1 tablet (1 tablet Oral Given 10/21/21 1451)  levofloxacin (LEVAQUIN) tablet 500 mg (500 mg Oral Given 10/21/21 1451)    ED Course/ Medical Decision Making/ A&P    Patient seen and examined. History obtained directly from patient. Work-up including labs, imaging, EKG ordered in triage, if performed, were reviewed.    Labs/EKG: Independently reviewed and interpreted.  This included: CBC with normal white blood cell count, mild anemia at 11.3, mild thrombocytopenia at 125; BMP with normal electrolytes, creatinine at 1.9/GFR 38 which appears to be baseline within normal potassium, glucose 113;  Imaging: Independently visualized and interpreted.  This included: Scrotal ultrasound, agree with report except for impression states RIGHT testicular findings which should be LEFT based on  exam and imaging and body of report.   Medications/Fluids: Ordered: Oral Percocet, oral Levaquin.  Most recent vital signs reviewed and are as follows: BP 90/60 (BP Location: Right Arm)   Pulse 94   Temp 98.9 F (37.2 C) (Oral)   Resp 11   Ht 6' (1.829 m)   Wt 76.2 kg   SpO2 100%   BMI 22.78 kg/m   Initial impression: Left-sided epididymitis, no signs of sepsis or torsion.  He will be prescribed renal dosed levofloxacin for 10 days.  Dose 500 mg and then 250 mg daily thereafter.  4:11 PM Reassessment performed. Patient appears stable, however he is becoming more hypotensive over the past hour.  Continues with normal mentation.  He does not feel lightheaded or dizzy. He states that this is because he has not taken his home medications, however I do not see any medications which would increase his blood pressure.  Labs personally reviewed and interpreted including: Continuing to pend UA.    Reviewed pertinent lab work and imaging with patient at bedside. Questions answered.   Most current vital signs reviewed and are as follows: BP  99/64   Pulse 63   Temp 98.9 F (37.2 C) (Oral)   Resp (!) 22   Ht 6' (1.829 m)   Wt 76.2 kg   SpO2 97%   BMI 22.78 kg/m   Plan: I asked RN to ambulate patient and perform orthostatic vital signs.  Signed out to [facility] at shift change and Dr. Sherry Ruffing.   If patient remains hypotensive, he may need gentle hydration, consideration of additional work-up if indicated.  If able to go home, would prefer bid for treatment of epididymitis, given that he continues to take amiodarone which I confirmed to prevent QTc prolongation.                           Medical Decision Making Amount and/or Complexity of Data Reviewed Labs: ordered.  Risk Prescription drug management.  Patient with scrotal swelling, ultrasound consistent with epididymitis.  Patient does not appear to be septic at this time.  No crepitus or pain out of proportion to suspect Fournier's gangrene.  Awaiting UA, however patient does not otherwise have signs of UTI.  Culture sent.  Do not suspect STI etiology of symptoms.        Final Clinical Impression(s) / ED Diagnoses Final diagnoses:  Epididymitis    Rx / DC Orders ED Discharge Orders     None         Carlisle Cater, PA-C 10/21/21 1615    Pattricia Boss, MD 10/26/21 [phone]

## 2021-10-24 ENCOUNTER — Other Ambulatory Visit (HOSPITAL_COMMUNITY): Payer: Self-pay

## 2021-10-24 LAB — URINE CULTURE: Culture: >=100000 — AB

## 2021-10-25 ENCOUNTER — Telehealth: Payer: Self-pay

## 2021-10-25 DIAGNOSIS — N189 Chronic kidney disease, unspecified: Secondary | ICD-10-CM | POA: Diagnosis not present

## 2021-10-25 DIAGNOSIS — Z6822 Body mass index (BMI) 22.0-22.9, adult: Secondary | ICD-10-CM | POA: Diagnosis not present

## 2021-10-25 DIAGNOSIS — N400 Enlarged prostate without lower urinary tract symptoms: Secondary | ICD-10-CM | POA: Diagnosis not present

## 2021-10-25 DIAGNOSIS — N451 Epididymitis: Secondary | ICD-10-CM | POA: Diagnosis not present

## 2021-10-25 NOTE — Telephone Encounter (Signed)
Post ED Visit - Positive Culture Follow-up  Culture report reviewed by antimicrobial stewardship pharmacist: [place] Team '[x]'$  Luisa Hart, [place].D. '[]'$  Heide Guile, Pharm.D., BCPS AQ-ID '[]'$  Parks Neptune, Pharm.D., BCPS '[]'$  Alycia Rossetti, Pharm.D., BCPS '[]'$  Jenison, [place].D., BCPS, AAHIVP '[]'$  Legrand Como, Pharm.D., BCPS, AAHIVP '[]'$  Salome Arnt, PharmD, BCPS '[]'$  Johnnette Gourd, PharmD, BCPS '[]'$  Hughes Better, PharmD, BCPS '[]'$  Leeroy Cha, PharmD '[]'$  Laqueta Linden, PharmD, BCPS '[]'$  Albertina Parr, PharmD  [place] Team '[]'$  Leodis Sias, PharmD '[]'$  Lindell Spar, PharmD '[]'$  Royetta Asal, PharmD '[]'$  Graylin Shiver, Rph '[]'$  Rema Fendt) Glennon Mac, PharmD '[]'$  Arlyn Dunning, PharmD '[]'$  Netta Cedars, PharmD '[]'$  Dia Sitter, PharmD '[]'$  Leone Haven, PharmD '[]'$  Gretta Arab, PharmD '[]'$  Theodis Shove, PharmD '[]'$  Peggyann Juba, PharmD '[]'$  Reuel Boom, PharmD   Positive urine culture Treated with Bactrim DS, organism sensitive to the same and no further patient follow-up is required at this time.  Glennon Hamilton 10/25/2021, 3:01 PM

## 2021-11-30 DIAGNOSIS — I5032 Chronic diastolic (congestive) heart failure: Secondary | ICD-10-CM | POA: Diagnosis not present

## 2021-11-30 DIAGNOSIS — R809 Proteinuria, unspecified: Secondary | ICD-10-CM | POA: Diagnosis not present

## 2021-11-30 DIAGNOSIS — N281 Cyst of kidney, acquired: Secondary | ICD-10-CM | POA: Diagnosis not present

## 2021-11-30 DIAGNOSIS — I129 Hypertensive chronic kidney disease with stage 1 through stage 4 chronic kidney disease, or unspecified chronic kidney disease: Secondary | ICD-10-CM | POA: Diagnosis not present

## 2021-11-30 DIAGNOSIS — N1832 Chronic kidney disease, stage 3b: Secondary | ICD-10-CM | POA: Diagnosis not present

## 2021-12-23 ENCOUNTER — Ambulatory Visit (HOSPITAL_COMMUNITY): Payer: PPO

## 2022-01-05 ENCOUNTER — Ambulatory Visit (HOSPITAL_COMMUNITY): Payer: HMO

## 2022-01-10 ENCOUNTER — Ambulatory Visit (HOSPITAL_COMMUNITY): Admission: RE | Admit: 2022-01-10 | Payer: HMO | Source: Ambulatory Visit

## 2022-01-12 ENCOUNTER — Ambulatory Visit (HOSPITAL_COMMUNITY)
Admission: RE | Admit: 2022-01-12 | Discharge: 2022-01-12 | Disposition: A | Discharge status: Home / Self Care | Payer: HMO | Source: Ambulatory Visit | Attending: Cardiology | Admitting: Cardiology

## 2022-01-12 DIAGNOSIS — I509 Heart failure, unspecified: Secondary | ICD-10-CM | POA: Diagnosis not present

## 2022-01-12 DIAGNOSIS — I7121 Aneurysm of the ascending aorta, without rupture: Secondary | ICD-10-CM | POA: Diagnosis not present

## 2022-01-12 DIAGNOSIS — I77810 Thoracic aortic ectasia: Secondary | ICD-10-CM | POA: Insufficient documentation

## 2022-01-12 DIAGNOSIS — I4891 Unspecified atrial fibrillation: Secondary | ICD-10-CM | POA: Insufficient documentation

## 2022-01-12 DIAGNOSIS — I110 Hypertensive heart disease with heart failure: Secondary | ICD-10-CM | POA: Insufficient documentation

## 2022-01-12 DIAGNOSIS — F17200 Nicotine dependence, unspecified, uncomplicated: Secondary | ICD-10-CM | POA: Diagnosis not present

## 2022-01-12 DIAGNOSIS — R000 Tachycardia, unspecified: Secondary | ICD-10-CM | POA: Insufficient documentation

## 2022-01-12 LAB — ECHOCARDIOGRAM COMPLETE
AR max vel: 4.21 cm2
AV Area VTI: 4.32 cm2
AV Area mean vel: 4.36 cm2
AV Mean grad: 2 mmHg
AV Peak grad: 5.5 mmHg
Ao pk vel: 1.17 m/s
Area-P 1/2: 2.21 cm2
Calc EF: 52.5 %
MV VTI: 4.72 cm2
P 1/2 time: 643 msec
S' Lateral: 4 cm
Single Plane A2C EF: 50.5 %
Single Plane A4C EF: 54.2 %

## 2022-01-12 NOTE — Progress Notes (Signed)
*  PRELIMINARY RESULTS* Echocardiogram 2D Echocardiogram has been performed.  John Hicks 01/12/2022, 2:56 PM

## 2022-01-17 ENCOUNTER — Encounter: Payer: Self-pay | Admitting: *Deleted

## 2022-01-20 DIAGNOSIS — N189 Chronic kidney disease, unspecified: Secondary | ICD-10-CM | POA: Diagnosis not present

## 2022-01-20 DIAGNOSIS — N451 Epididymitis: Secondary | ICD-10-CM | POA: Diagnosis not present

## 2022-01-20 DIAGNOSIS — N400 Enlarged prostate without lower urinary tract symptoms: Secondary | ICD-10-CM | POA: Diagnosis not present

## 2022-01-20 DIAGNOSIS — Z6823 Body mass index (BMI) 23.0-23.9, adult: Secondary | ICD-10-CM | POA: Diagnosis not present

## 2022-01-20 DIAGNOSIS — B3742 Candidal balanitis: Secondary | ICD-10-CM | POA: Diagnosis not present

## 2022-01-20 DIAGNOSIS — F419 Anxiety disorder, unspecified: Secondary | ICD-10-CM | POA: Diagnosis not present

## 2022-02-06 DIAGNOSIS — N1832 Chronic kidney disease, stage 3b: Secondary | ICD-10-CM | POA: Diagnosis not present

## 2022-02-06 DIAGNOSIS — R809 Proteinuria, unspecified: Secondary | ICD-10-CM | POA: Diagnosis not present

## 2022-02-06 DIAGNOSIS — I129 Hypertensive chronic kidney disease with stage 1 through stage 4 chronic kidney disease, or unspecified chronic kidney disease: Secondary | ICD-10-CM | POA: Diagnosis not present

## 2022-02-06 DIAGNOSIS — I5032 Chronic diastolic (congestive) heart failure: Secondary | ICD-10-CM | POA: Diagnosis not present

## 2022-02-09 DIAGNOSIS — R809 Proteinuria, unspecified: Secondary | ICD-10-CM | POA: Diagnosis not present

## 2022-02-09 DIAGNOSIS — N1832 Chronic kidney disease, stage 3b: Secondary | ICD-10-CM | POA: Diagnosis not present

## 2022-02-09 DIAGNOSIS — I129 Hypertensive chronic kidney disease with stage 1 through stage 4 chronic kidney disease, or unspecified chronic kidney disease: Secondary | ICD-10-CM | POA: Diagnosis not present

## 2022-02-09 DIAGNOSIS — N281 Cyst of kidney, acquired: Secondary | ICD-10-CM | POA: Diagnosis not present

## 2022-02-09 DIAGNOSIS — I5032 Chronic diastolic (congestive) heart failure: Secondary | ICD-10-CM | POA: Diagnosis not present

## 2022-02-10 DIAGNOSIS — N400 Enlarged prostate without lower urinary tract symptoms: Secondary | ICD-10-CM | POA: Diagnosis not present

## 2022-02-10 DIAGNOSIS — K644 Residual hemorrhoidal skin tags: Secondary | ICD-10-CM | POA: Diagnosis not present

## 2022-02-10 DIAGNOSIS — E039 Hypothyroidism, unspecified: Secondary | ICD-10-CM | POA: Diagnosis not present

## 2022-02-10 DIAGNOSIS — K625 Hemorrhage of anus and rectum: Secondary | ICD-10-CM | POA: Diagnosis not present

## 2022-02-10 DIAGNOSIS — Z6822 Body mass index (BMI) 22.0-22.9, adult: Secondary | ICD-10-CM | POA: Diagnosis not present

## 2022-02-10 DIAGNOSIS — N183 Chronic kidney disease, stage 3 unspecified: Secondary | ICD-10-CM | POA: Diagnosis not present

## 2022-02-14 ENCOUNTER — Encounter (INDEPENDENT_AMBULATORY_CARE_PROVIDER_SITE_OTHER): Payer: Self-pay | Admitting: *Deleted

## 2022-04-21 ENCOUNTER — Ambulatory Visit (HOSPITAL_COMMUNITY)
Admission: RE | Admit: 2022-04-21 | Discharge: 2022-04-21 | Disposition: A | Discharge status: Home / Self Care | Payer: PPO | Source: Ambulatory Visit | Attending: Family Medicine | Admitting: Family Medicine

## 2022-04-21 ENCOUNTER — Other Ambulatory Visit (HOSPITAL_COMMUNITY): Payer: Self-pay | Admitting: Family Medicine

## 2022-04-21 DIAGNOSIS — N451 Epididymitis: Secondary | ICD-10-CM | POA: Insufficient documentation

## 2022-04-24 ENCOUNTER — Ambulatory Visit (INDEPENDENT_AMBULATORY_CARE_PROVIDER_SITE_OTHER): Payer: No Typology Code available for payment source | Admitting: Gastroenterology

## 2022-05-18 ENCOUNTER — Other Ambulatory Visit (HOSPITAL_COMMUNITY): Payer: Self-pay | Admitting: Nephrology

## 2022-05-18 DIAGNOSIS — I129 Hypertensive chronic kidney disease with stage 1 through stage 4 chronic kidney disease, or unspecified chronic kidney disease: Secondary | ICD-10-CM

## 2022-05-18 DIAGNOSIS — R809 Proteinuria, unspecified: Secondary | ICD-10-CM

## 2022-05-18 DIAGNOSIS — N1832 Chronic kidney disease, stage 3b: Secondary | ICD-10-CM

## 2022-05-18 DIAGNOSIS — D638 Anemia in other chronic diseases classified elsewhere: Secondary | ICD-10-CM

## 2022-06-05 DIAGNOSIS — E039 Hypothyroidism, unspecified: Secondary | ICD-10-CM | POA: Diagnosis not present

## 2022-06-05 DIAGNOSIS — N183 Chronic kidney disease, stage 3 unspecified: Secondary | ICD-10-CM | POA: Diagnosis not present

## 2022-06-05 DIAGNOSIS — Z6824 Body mass index (BMI) 24.0-24.9, adult: Secondary | ICD-10-CM | POA: Diagnosis not present

## 2022-06-05 DIAGNOSIS — J0100 Acute maxillary sinusitis, unspecified: Secondary | ICD-10-CM | POA: Diagnosis not present

## 2022-06-13 ENCOUNTER — Other Ambulatory Visit: Payer: Self-pay | Admitting: *Deleted

## 2022-06-13 ENCOUNTER — Encounter: Payer: Self-pay | Admitting: *Deleted

## 2022-06-13 DIAGNOSIS — I77810 Thoracic aortic ectasia: Secondary | ICD-10-CM

## 2022-06-13 NOTE — Progress Notes (Signed)
echo

## 2022-06-28 ENCOUNTER — Ambulatory Visit (HOSPITAL_COMMUNITY)
Admission: RE | Admit: 2022-06-28 | Discharge: 2022-06-28 | Disposition: A | Discharge status: Home / Self Care | Payer: PPO | Source: Ambulatory Visit | Attending: Cardiology | Admitting: Cardiology

## 2022-06-28 ENCOUNTER — Other Ambulatory Visit: Payer: Self-pay | Admitting: *Deleted

## 2022-06-28 DIAGNOSIS — R0600 Dyspnea, unspecified: Secondary | ICD-10-CM | POA: Diagnosis not present

## 2022-06-28 DIAGNOSIS — F17200 Nicotine dependence, unspecified, uncomplicated: Secondary | ICD-10-CM | POA: Diagnosis not present

## 2022-06-28 DIAGNOSIS — R570 Cardiogenic shock: Secondary | ICD-10-CM | POA: Insufficient documentation

## 2022-06-28 DIAGNOSIS — I509 Heart failure, unspecified: Secondary | ICD-10-CM | POA: Insufficient documentation

## 2022-06-28 DIAGNOSIS — I4891 Unspecified atrial fibrillation: Secondary | ICD-10-CM | POA: Diagnosis not present

## 2022-06-28 DIAGNOSIS — I351 Nonrheumatic aortic (valve) insufficiency: Secondary | ICD-10-CM | POA: Insufficient documentation

## 2022-06-28 DIAGNOSIS — R000 Tachycardia, unspecified: Secondary | ICD-10-CM | POA: Insufficient documentation

## 2022-06-28 DIAGNOSIS — I4892 Unspecified atrial flutter: Secondary | ICD-10-CM | POA: Diagnosis not present

## 2022-06-28 DIAGNOSIS — I77810 Thoracic aortic ectasia: Secondary | ICD-10-CM | POA: Insufficient documentation

## 2022-06-28 DIAGNOSIS — I712 Thoracic aortic aneurysm, without rupture, unspecified: Secondary | ICD-10-CM

## 2022-06-28 LAB — ECHOCARDIOGRAM COMPLETE
Area-P 1/2: 2.21 cm2
Calc EF: 41.2 %
P 1/2 time: 958 msec
S' Lateral: 4.4 cm
Single Plane A2C EF: 49 %
Single Plane A4C EF: 37.8 %

## 2022-06-28 NOTE — Progress Notes (Signed)
  Echocardiogram 2D Echocardiogram has been performed.  John Hicks 06/28/2022, 11:23 AM

## 2022-06-30 ENCOUNTER — Inpatient Hospital Stay: Payer: PPO | Attending: Hematology | Admitting: Hematology

## 2022-06-30 ENCOUNTER — Encounter: Payer: Self-pay | Admitting: Hematology

## 2022-06-30 ENCOUNTER — Inpatient Hospital Stay: Payer: PPO

## 2022-06-30 VITALS — BP 119/83 | HR 63 | Temp 98.0°F | Resp 16 | Ht 72.0 in | Wt 182.5 lb

## 2022-06-30 DIAGNOSIS — D631 Anemia in chronic kidney disease: Secondary | ICD-10-CM | POA: Diagnosis not present

## 2022-06-30 DIAGNOSIS — F17210 Nicotine dependence, cigarettes, uncomplicated: Secondary | ICD-10-CM | POA: Insufficient documentation

## 2022-06-30 DIAGNOSIS — N189 Chronic kidney disease, unspecified: Secondary | ICD-10-CM | POA: Insufficient documentation

## 2022-06-30 DIAGNOSIS — D72819 Decreased white blood cell count, unspecified: Secondary | ICD-10-CM | POA: Diagnosis not present

## 2022-06-30 DIAGNOSIS — Z79899 Other long term (current) drug therapy: Secondary | ICD-10-CM | POA: Diagnosis not present

## 2022-06-30 DIAGNOSIS — Z7982 Long term (current) use of aspirin: Secondary | ICD-10-CM | POA: Insufficient documentation

## 2022-06-30 DIAGNOSIS — D696 Thrombocytopenia, unspecified: Secondary | ICD-10-CM | POA: Diagnosis not present

## 2022-06-30 DIAGNOSIS — Z87891 Personal history of nicotine dependence: Secondary | ICD-10-CM

## 2022-06-30 DIAGNOSIS — Z122 Encounter for screening for malignant neoplasm of respiratory organs: Secondary | ICD-10-CM

## 2022-06-30 LAB — CBC WITH DIFFERENTIAL/PLATELET
Abs Immature Granulocytes: 0.01 10*3/uL (ref 0.00–0.07)
Basophils Absolute: 0 10*3/uL (ref 0.0–0.1)
Basophils Relative: 1 %
Eosinophils Absolute: 0.1 10*3/uL (ref 0.0–0.5)
Eosinophils Relative: 4 %
HCT: 32.8 % — ABNORMAL LOW (ref 39.0–52.0)
Hemoglobin: 10.9 g/dL — ABNORMAL LOW (ref 13.0–17.0)
Immature Granulocytes: 1 %
Lymphocytes Relative: 45 %
Lymphs Abs: 1 10*3/uL (ref 0.7–4.0)
MCH: 34.1 pg — ABNORMAL HIGH (ref 26.0–34.0)
MCHC: 33.2 g/dL (ref 30.0–36.0)
MCV: 102.5 fL — ABNORMAL HIGH (ref 80.0–100.0)
Monocytes Absolute: 0.1 10*3/uL (ref 0.1–1.0)
Monocytes Relative: 6 %
Neutro Abs: 0.9 10*3/uL — ABNORMAL LOW (ref 1.7–7.7)
Neutrophils Relative %: 43 %
Platelets: 140 10*3/uL — ABNORMAL LOW (ref 150–400)
RBC: 3.2 MIL/uL — ABNORMAL LOW (ref 4.22–5.81)
RDW: 15.1 % (ref 11.5–15.5)
WBC: 2.1 10*3/uL — ABNORMAL LOW (ref 4.0–10.5)
nRBC: 0 % (ref 0.0–0.2)

## 2022-06-30 LAB — HEPATITIS B SURFACE ANTIGEN: Hepatitis B Surface Ag: NONREACTIVE

## 2022-06-30 LAB — IRON AND TIBC
Iron: 59 ug/dL (ref 45–182)
Saturation Ratios: 17 % — ABNORMAL LOW (ref 17.9–39.5)
TIBC: 339 ug/dL (ref 250–450)
UIBC: 280 ug/dL

## 2022-06-30 LAB — RETICULOCYTES
Immature Retic Fract: 22.7 % — ABNORMAL HIGH (ref 2.3–15.9)
RBC.: 3.13 MIL/uL — ABNORMAL LOW (ref 4.22–5.81)
Retic Count, Absolute: 77 10*3/uL (ref 19.0–186.0)
Retic Ct Pct: 2.5 % (ref 0.4–3.1)

## 2022-06-30 LAB — FOLATE: Folate: 33.9 ng/mL (ref >5.9)

## 2022-06-30 LAB — LACTATE DEHYDROGENASE: LDH: 145 U/L (ref 98–192)

## 2022-06-30 LAB — HEPATITIS B CORE ANTIBODY, TOTAL: Hep B Core Total Ab: NONREACTIVE

## 2022-06-30 LAB — VITAMIN B12: Vitamin B-12: 431 pg/mL (ref 180–914)

## 2022-06-30 LAB — FERRITIN: Ferritin: 108 ng/mL (ref 24–336)

## 2022-06-30 LAB — HEPATITIS C ANTIBODY: HCV Ab: NONREACTIVE

## 2022-06-30 LAB — HEPATITIS B SURFACE ANTIBODY,QUALITATIVE: Hep B S Ab: NONREACTIVE

## 2022-06-30 NOTE — Progress Notes (Signed)
[facility] [address] [place] [address]   Clinic Day:  06/30/2022  Referring physician: Liana Gerold, MD  Patient Care Team: Redmond [employer], MD as PCP - General (Internal Medicine) Minus Breeding, MD as PCP - Cardiology (Cardiology)   ASSESSMENT & PLAN:   Assessment:  1.  Leukopenia and mild thrombocytopenia: - Patient seen at the request of Dr. Theador Hawthorne. - CBC (05/16/2022): WBC-2.7, ANC-1.4, PLT-117, Hb-11.1, MCV-98 - No previous history of leukopenia or thrombocytopenia. - Denies any B symptoms or infections. - CT (06/27/2019): Spleen size normal. - Blood work done by Dr. Theador Hawthorne showed negative SPEP and immunofixation.  No prior transfusion history.  Never had colonoscopy, but seeing GI next week.  2.  Social/family history: - He is independent of ADLs and IADLs.  He served in [employer] in the 1970s.  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  Current active smoker, 1 pack/day for 50 years. - No family history of leukopenia or malignancies.   Plan:  1.  Leukopenia and mild thrombocytopenia: - Denies starting any new medications.  Denies any infections. - Will repeat his CBC with differential today. - Will check for nutritional deficiencies including [id number], folic acid, MMA and copper levels.  Will check for ANA and rheumatoid factor, hepatitis B and C serology.  Will also check free light chains to complete workup. - RTC 2 to 3 weeks for follow-up.  2.  Macrocytic anemia: - Combination anemia from CKD, relative iron deficiency and likely other nutritional deficiencies or early MDS. - Will check ferritin and iron panel today.  3.  Smoking history: - We have discussed low-dose CT scan for lung cancer screening.  He is agreeable.  Will schedule for the scan.   No orders of the defined types were placed in this encounter.     I,Alexis Herring,acting as a Education administrator for [employer], MD.,have  documented all relevant documentation on the behalf of Derek Jack, MD,as directed by  Derek Jack, MD while in the presence of Derek Jack, MD.   I, Derek Jack MD, have reviewed the above documentation for accuracy and completeness, and I agree with the above.   Alexis Herring   2/9/20247:40 AM  CHIEF COMPLAINT/PURPOSE OF CONSULT:   Diagnosis: Leukopenia and mild thrombocytopenia   Current Therapy:  none; pending workup  HISTORY OF PRESENT ILLNESS:   John Hicks is a 72 y.o. male presenting to clinic today for evaluation of thrombocytopenia at the request of his nephrologist Dr. Ulice Bold.  Today, he states that he is doing well overall. His appetite level is at 50%. His energy level is at 50%. He has gained 20 lbs in the last 6 months. His kideny problem has been going on since 2018. Dr. Theador Hawthorne is the only doctor to tell him that he has a low white blood count. He reports hematuria issues in the past but nothing recently. He denies any other bleeding problems or bruising. He denies any history of blood transfusions. He takes folic acid supplementation. He denies any infections or recent Covid, flu, or RSV.   He has no previous history of heart attacks and strokes. He has smoked 1ppd for 50 yrs. He has no family history of low white blood cell count. No history of cancer in his family. His sister has MS.  He will be seeing a GI doctor on next week to have his first colonoscopy.   He had appendicitis with rupture in  1978.  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 He is unsure of any prior asbestos exposure.   PAST MEDICAL HISTORY:   Past Medical History: Past Medical History:  Diagnosis Date   AAA (abdominal aortic aneurysm) (HCC)    AF (atrial fibrillation) (HCC)    CHF (congestive heart failure)  (HCC)    Dyspnea    Hypertension     Surgical History: Past Surgical History:  Procedure Laterality Date   ABDOMINAL AORTIC ENDOVASCULAR STENT GRAFT N/A 02/03/2019   Procedure: ABDOMINAL AORTIC ENDOVASCULAR STENT GRAFT;  Surgeon: Waynetta Sandy, MD;  Location: [facility];  Service: Vascular;  Laterality: N/A;   APPENDECTOMY     KNEE SURGERY     ULTRASOUND GUIDANCE FOR VASCULAR ACCESS  02/03/2019   Procedure: Ultrasound Guidance For Vascular Access;  Surgeon: Waynetta Sandy, MD;  Location: [facility] OR;  Service: Vascular;;    Social History: Social History   Socioeconomic History   Marital status: Single    Spouse name: Not on file   Number of children: Not on file   Years of education: Not on file   Highest education level: Not on file  Occupational History   Occupation: Retired  Tobacco Use   Smoking status: Every Day    Packs/day: 1.00    Types: Cigarettes   Smokeless tobacco: Never   Tobacco comments:    ppd  Vaping Use   Vaping Use: Never used  Substance and Sexual Activity   Alcohol use: No   Drug use: Yes    Types: Marijuana   Sexual activity: Not Currently  Other Topics Concern   Not on file  Social History Narrative   Pt lives w/ landlord and [place].   Social Determinants of Health   Financial Resource Strain: Not on file  Food Insecurity: Not on file  Transportation Needs: Not on file  Physical Activity: Not on file  Stress: Not on file  Social Connections: Not on file  Intimate Partner Violence: Not on file    Family History: Family History  Problem Relation Age of Onset   Stroke Father 74       went in the bathroom and collapsed   Multiple sclerosis Sister    Psychiatric Illness Mother 42    Current Medications:  Current Outpatient Medications:    ALPRAZolam (XANAX) 0.5 MG tablet, Take 0.5 mg by mouth 3 (three) times daily as needed for anxiety., Disp: , Rfl:    amiodarone (PACERONE) 200 MG tablet, Take 200 mg by mouth  daily., Disp: , Rfl: 0   aspirin 81 MG chewable tablet, Chew 81 mg by mouth daily., Disp: , Rfl:    carvedilol (COREG) 3.125 MG tablet, Take 1 tablet (3.125 mg total) by mouth 2 (two) times daily., Disp: 180 tablet, Rfl: 3   Coenzyme Q10 (COQ10) 200 MG CAPS, Take 200 mg by mouth daily., Disp: , Rfl:    ferrous sulfate 324 (65 Fe) MG TBEC, Take 1 tablet by mouth 4 (four) times daily., Disp: , Rfl:    Flaxseed, Linseed, (FLAXSEED OIL) 1000 MG CAPS, Take 1,000 mg by mouth daily., Disp: , Rfl:    furosemide (LASIX) 20 MG tablet, Take 40 mg by mouth daily., Disp: , Rfl:    levothyroxine (SYNTHROID) 25 MCG tablet, Take 25 mcg by mouth daily., Disp: , Rfl:    polyethylene  glycol-electrolytes (TRILYTE) 420 g solution, Take 4,000 mLs by mouth as directed., Disp: 4000 mL, Rfl: 0   spironolactone (ALDACTONE) 25 MG tablet, Take 0.5 tablets (12.5 mg total) by mouth daily. (Patient taking differently: Take 25 mg by mouth daily.), Disp: 30 tablet, Rfl: 6   tamsulosin (FLOMAX) 0.4 MG CAPS capsule, Take 0.4 mg by mouth daily., Disp: , Rfl:    Allergies: Allergies  Allergen Reactions   Codeine Other (See Comments)    agitation    REVIEW OF SYSTEMS:   Review of Systems  Constitutional:  Negative for chills, fatigue and fever.  HENT:   Positive for trouble swallowing (Due to teeth). Negative for lump/mass, mouth sores, nosebleeds and sore throat.   Eyes:  Negative for eye problems.  Respiratory:  Negative for cough.   Cardiovascular:  Negative for chest pain, leg swelling and palpitations.  Gastrointestinal:  Positive for constipation (occasionally). Negative for abdominal pain, diarrhea, nausea and vomiting.  Genitourinary:  Negative for bladder incontinence, difficulty urinating, dysuria, frequency, hematuria and nocturia.   Musculoskeletal:  Negative for arthralgias, back pain, flank pain, myalgias and neck pain.  Skin:  Negative for itching and rash.  Neurological:  Positive for numbness (Burning  Hands). Negative for dizziness and headaches.  Hematological:  Does not bruise/bleed easily.  Psychiatric/Behavioral:  Positive for sleep disturbance. Negative for depression and suicidal ideas. The patient is not nervous/anxious.   All other systems reviewed and are negative.    VITALS:   There were no vitals taken for this visit.  Wt Readings from Last 3 Encounters:  10/21/21 76.2 kg (168 lb)  06/23/21 80.3 kg (177 lb)  06/08/21 80.4 kg (177 lb 3.2 oz)    There is no height or weight on file to calculate BMI.   PHYSICAL EXAM:   Physical Exam Vitals and nursing note reviewed. Exam conducted with a chaperone present.  Constitutional:      Appearance: Normal appearance.  Cardiovascular:     Rate and Rhythm: Normal rate and regular rhythm.     Pulses: Normal pulses.     Heart sounds: Normal heart sounds.  Pulmonary:     Effort: Pulmonary effort is normal.     Breath sounds: Normal breath sounds.  Abdominal:     Palpations: Abdomen is soft. There is no hepatomegaly, splenomegaly or mass.     Tenderness: There is no abdominal tenderness.  Musculoskeletal:        General: No swelling.     Right lower leg: No edema.     Left lower leg: No edema.  Lymphadenopathy:     Cervical: No cervical adenopathy.     Right cervical: No superficial, deep or posterior cervical adenopathy.    Left cervical: No superficial, deep or posterior cervical adenopathy.     Upper Body:     Right upper body: No supraclavicular or axillary adenopathy.     Left upper body: No supraclavicular or axillary adenopathy.  Neurological:     General: No focal deficit present.     Mental Status: He is alert and oriented to person, place, and time.  Psychiatric:        Mood and Affect: Mood normal.        Behavior: Behavior normal.     LABS:      Latest Ref Rng & Units 10/21/2021   12:21 PM 03/22/2021   11:25 AM 02/04/2019    3:00 AM  CBC  WBC 4.0 - 10.5 K/uL 5.8  8.2  10.4  Hemoglobin 13.0 - 17.0  g/dL 11.3  14.3  8.1   Hematocrit 39.0 - 52.0 % 32.8  42.8  24.1   Platelets 150 - 400 K/uL 125  187  154       Latest Ref Rng & Units 10/21/2021   12:21 PM 06/07/2021   11:50 AM 06/02/2021    4:35 PM  CMP  Glucose 70 - 99 mg/dL 113  102    BUN 8 - 23 mg/dL 29  40    Creatinine 0.61 - 1.24 mg/dL 1.90  2.04  2.10   Sodium 135 - 145 mmol/L 138  133    Potassium 3.5 - 5.1 mmol/L 3.8  5.1    Chloride 98 - 111 mmol/L 105  99    CO2 22 - 32 mmol/L 25  26    Calcium 8.9 - 10.3 mg/dL 9.1  9.8       No results found for: "CEA1", "CEA" / No results found for: "CEA1", "CEA" No results found for: "PSA1" No results found for: "[id number]" No results found for: "CAN125"  No results found for: "TOTALPROTELP", "ALBUMINELP", "A1GS", "A2GS", "BETS", "BETA2SER", "GAMS", "MSPIKE", "SPEI" No results found for: "TIBC", "FERRITIN", "IRONPCTSAT" No results found for: "LDH"   STUDIES:   ECHOCARDIOGRAM COMPLETE  Result Date: 06/28/2022    ECHOCARDIOGRAM REPORT   Patient Name:   KINGSTIN ASARE Date of Exam: 06/28/2022 Medical Rec #:  [id number]      Height:       72.0 in Accession #:    [id number]     Weight:       168.0 lb Date of Birth:  [birth year]       BSA:          1.978 m Patient Age:    34 years       BP:           145/82 mmHg Patient Gender: M              HR:           56 bpm. Exam Location:  [facility] Procedure: 2D Echo, 3D Echo, Cardiac Doppler and Color Doppler Indications:    Thoracic aortic aneurysm without rupture ([place])  History:        Patient has prior history of Echocardiogram examinations, most                 recent 01/12/2022. CHF and Cardiogenic shock, Thoracic aortic                 aneurysm without rupture (HCC), Arrythmias:Atrial Fibrillation,                 Atrial Flutter and Tachycardia, Signs/Symptoms:Dyspnea; Risk                 Factors:Current Smoker.  Sonographer:    Greer Pickerel Referring Phys: [address]  Sonographer Comments: Image acquisition challenging due to respiratory  motion. IMPRESSIONS  1. Left ventricular ejection fraction, by estimation, is 45 to 50%. The left ventricle has mildly decreased function. The left ventricle demonstrates global hypokinesis. There is moderate left ventricular hypertrophy. Left ventricular diastolic parameters are consistent with Grade I diastolic dysfunction (impaired relaxation).  2. Right ventricular systolic function is normal. The right ventricular size is normal.  3. Left atrial size was mild to moderately dilated.  4. The mitral valve is normal in structure. No evidence of mitral valve regurgitation. No evidence of mitral stenosis.  5. The aortic  valve is tricuspid. Aortic valve regurgitation is moderate. No aortic stenosis is present.  6. Aortic dilatation noted. There is severe dilatation of the aortic root, measuring 55 mm. There is severe dilatation of the ascending aorta, measuring 58 mm. FINDINGS  Left Ventricle: Left ventricular ejection fraction, by estimation, is 45 to 50%. The left ventricle has mildly decreased function. The left ventricle demonstrates global hypokinesis. The left ventricular internal cavity size was normal in size. There is  moderate left ventricular hypertrophy. Left ventricular diastolic parameters are consistent with Grade I diastolic dysfunction (impaired relaxation). Right Ventricle: IVC poorly visualized, not able to estimated RA pressure. Therefore cannot estimate PASP. The right ventricular size is normal. Right vetricular wall thickness was not well visualized. Right ventricular systolic function is normal. Left Atrium: Left atrial size was mild to moderately dilated. Right Atrium: Right atrial size was normal in size. Pericardium: There is no evidence of pericardial effusion. Mitral Valve: The mitral valve is normal in structure. No evidence of mitral valve regurgitation. No evidence of mitral valve stenosis. Tricuspid Valve: The tricuspid valve is normal in structure. Tricuspid valve regurgitation is  mild . No evidence of tricuspid stenosis. Aortic Valve: The aortic valve is tricuspid. Aortic valve regurgitation is moderate. Aortic regurgitation PHT measures 958 msec. No aortic stenosis is present. Pulmonic Valve: The pulmonic valve was not well visualized. Pulmonic valve regurgitation is not visualized. No evidence of pulmonic stenosis. Aorta: Aortic dilatation noted. There is severe dilatation of the aortic root, measuring 55 mm. There is severe dilatation of the ascending aorta, measuring 58 mm. Venous: The inferior vena cava was not well visualized. IAS/Shunts: No atrial level shunt detected by color flow Doppler.  LEFT VENTRICLE PLAX 2D LVIDd:         5.90 cm      Diastology LVIDs:         4.40 cm      LV e' medial:    4.32 cm/s LV PW:         1.40 cm      LV E/e' medial:  12.1 LV IVS:        1.30 cm      LV e' lateral:   8.45 cm/s LVOT diam:     2.70 cm      LV E/e' lateral: 6.2 LV SV:         92 LV SV Index:   46 LVOT Area:     5.73 cm                              3D Volume EF: LV Volumes (MOD)            3D EF:        47 % LV vol d, MOD A2C: 177.0 ml LV EDV:       241 ml LV vol d, MOD A4C: 225.0 ml LV ESV:       127 ml LV vol s, MOD A2C: 90.3 ml  LV SV:        113 ml LV vol s, MOD A4C: 140.0 ml LV SV MOD A2C:     86.7 ml LV SV MOD A4C:     225.0 ml LV SV MOD BP:      81.8 ml RIGHT VENTRICLE RV S prime:     12.00 cm/s TAPSE (M-mode): 1.8 cm LEFT ATRIUM             Index  RIGHT ATRIUM           Index LA diam:        4.60 cm 2.33 cm/m   RA Area:     18.50 cm LA Vol (A2C):   79.0 ml 39.93 ml/m  RA Volume:   42.50 ml  21.48 ml/m LA Vol (A4C):   87.1 ml 44.03 ml/m LA Biplane Vol: 83.4 ml 42.16 ml/m  AORTIC VALVE             PULMONIC VALVE LVOT Vmax:   78.20 cm/s  PR End Diast Vel: 4.15 msec LVOT Vmean:  46.300 cm/s LVOT VTI:    0.160 m AI PHT:      958 msec  AORTA Ao Root diam: 5.50 cm Ao Asc diam:  5.80 cm MITRAL VALVE               TRICUSPID VALVE MV Area (PHT): 2.21 cm    TR Peak grad:   21.3  mmHg MV Decel Time: 343 msec    TR Vmax:        231.00 cm/s MV E velocity: 52.10 cm/s MV A velocity: 75.80 cm/s  SHUNTS MV E/A ratio:  0.69        Systemic VTI:  0.16 m                            Systemic Diam: 2.70 cm Carlyle Dolly MD Electronically signed by Carlyle Dolly MD Signature Date/Time: 06/28/2022/12:30:08 PM    Final

## 2022-06-30 NOTE — Patient Instructions (Signed)
[facility]  Discharge Instructions  You were seen and examined today by Dr. Delton Coombes. Dr. Delton Coombes is a hematologist, meaning that he specializes in blood abnormalities. Dr. Delton Coombes discussed your past medical history, family history of cancers/blood conditions and the events that led to you being here today.  You were referred to Dr. Delton Coombes due to decreased white blood cell count. It looks like this has only happened once.  Dr. Delton Coombes has recommended additional labs today, one to recheck your white blood cells.  Follow-up as scheduled.  Thank you for choosing [facility] to provide your oncology and hematology care.   To afford each patient quality time with our provider, please arrive at least 15 minutes before your scheduled appointment time. You may need to reschedule your appointment if you arrive late (10 or more minutes). Arriving late affects you and other patients whose appointments are after yours.  Also, if you miss three or more appointments without notifying the office, you may be dismissed from the clinic at the provider's discretion.    Again, thank you for choosing [facility].  Our hope is that these requests will decrease the amount of time that you wait before being seen by our physicians.   If you have a lab appointment with the [facility] please come in thru the Main Entrance and check in at the main information desk.           _____________________________________________________________  Should you have questions after your visit to [facility], please contact our office at [phone] and follow the prompts.  Our office hours are 8:00 a.m. to 4:30 p.m. Monday - Thursday and 8:00 a.m. to 2:30 p.m. Friday.  Please note that voicemails left after 4:00 p.m. may not be returned until the following business day.  We are closed weekends and all major holidays.  You do have  access to a nurse 24-7, just call the main number to the clinic [phone] and do not press any options, hold on the line and a nurse will answer the phone.    For prescription refill requests, have your pharmacy contact our office and allow 72 hours.    Masks are optional in the cancer centers. If you would like for your care team to wear a mask while they are taking care of you, please let them know. You may have one support person who is at least 70 years old accompany you for your appointments.

## 2022-07-02 LAB — RHEUMATOID FACTOR: Rheumatoid fact SerPl-aCnc: 10.4 IU/mL (ref <14.0)

## 2022-07-03 ENCOUNTER — Ambulatory Visit (INDEPENDENT_AMBULATORY_CARE_PROVIDER_SITE_OTHER): Payer: Self-pay | Admitting: Gastroenterology

## 2022-07-03 ENCOUNTER — Encounter (INDEPENDENT_AMBULATORY_CARE_PROVIDER_SITE_OTHER): Payer: Self-pay | Admitting: Gastroenterology

## 2022-07-03 VITALS — BP 119/61 | HR 73 | Temp 97.6°F | Ht 72.0 in | Wt 182.9 lb

## 2022-07-03 DIAGNOSIS — K625 Hemorrhage of anus and rectum: Secondary | ICD-10-CM | POA: Insufficient documentation

## 2022-07-03 DIAGNOSIS — Z1211 Encounter for screening for malignant neoplasm of colon: Secondary | ICD-10-CM

## 2022-07-03 LAB — KAPPA/LAMBDA LIGHT CHAINS
Kappa free light chain: 61.8 mg/L — ABNORMAL HIGH (ref 3.3–19.4)
Kappa, lambda light chain ratio: 1.8 — ABNORMAL HIGH (ref 0.26–1.65)
Lambda free light chains: 34.4 mg/L — ABNORMAL HIGH (ref 5.7–26.3)

## 2022-07-03 NOTE — Addendum Note (Signed)
Addended by: Harvel Quale on: 07/03/2022 10:24 PM   Modules accepted: Level of Service

## 2022-07-03 NOTE — Patient Instructions (Signed)
We will schedule you for a colonoscopy  Follow up will be determined after procedure

## 2022-07-03 NOTE — Progress Notes (Addendum)
Referring Provider: Redmond School, MD Primary Care Physician:  Derek Jack, MD Primary GI Physician: new   Chief Complaint  Patient presents with   Rectal Bleeding    Referred for rectal bleeding. Patient reports he had a bump in rectal area that was bleeding. Reports he has not had any bleeding since last December.    HPI:   John Hicks is a 72 y.o. male with past medical history of AAA, Afib, BPH, CHF, CKD stage III, HTN, hypothyroidism  Patient presenting today for rectal bleeding   Patient notes that he had an appt back in December for a lesion near his rectum that he referred to as a "zit" that he messed with and got some purulent drainage and blood out of. Notes that this resolved so he cancelled his appt and has had no issues with this since then. He reports some presence of hemorrhoids but denies any rectal bleeding or issues with these. He is having a BM maybe every 3 days and reports he has to eat a lot to have a good BM, does not have to take anything for this. Appetite is good, no weight loss. No melena. Denies abdominal pain. No nausea or vomiting. He is trying to quit smoking   He follows with Dr. Raliegh Ip for pancytopenia, iron studies WNL other than mildly low saturation of 17. Not currently on PO iron, was stopped about 6-7 months ago. Denies sob, dizziness, fatigue.   NSAID use: none  Social hx: smokes cigarettes, trying to stop, no etoh  Fam hx: no crc or liver disease   Last Colonoscopy: never  Last Endoscopy: states he has had one years ago   Recommendations:    Past Medical History:  Diagnosis Date   AAA (abdominal aortic aneurysm) (HCC)    AF (atrial fibrillation) (HCC)    BPH (benign prostatic hyperplasia)    CHF (congestive heart failure) (HCC)    Chronic kidney disease    stage 3   Dyspnea    Hypertension    Hypothyroid     Past Surgical History:  Procedure Laterality Date   ABDOMINAL AORTIC ENDOVASCULAR STENT GRAFT N/A 02/03/2019    Procedure: ABDOMINAL AORTIC ENDOVASCULAR STENT GRAFT;  Surgeon: Waynetta Sandy, MD;  Location: [facility];  Service: Vascular;  Laterality: N/A;   APPENDECTOMY     KNEE SURGERY     ULTRASOUND GUIDANCE FOR VASCULAR ACCESS  02/03/2019   Procedure: Ultrasound Guidance For Vascular Access;  Surgeon: Waynetta Sandy, MD;  Location: [facility];  Service: Vascular;;    Current Outpatient Medications  Medication Sig Dispense Refill   ALPRAZolam (XANAX) 0.5 MG tablet Take 0.5 mg by mouth 3 (three) times daily as needed for anxiety.     amiodarone (PACERONE) 200 MG tablet Take 200 mg by mouth daily.  0   aspirin 81 MG chewable tablet Chew 81 mg by mouth daily.     carvedilol (COREG) 3.125 MG tablet Take 1 tablet (3.125 mg total) by mouth 2 (two) times daily. 180 tablet 3   Coenzyme Q10 (COQ10) 200 MG CAPS Take 200 mg by mouth daily.     Flaxseed, Linseed, (FLAXSEED OIL) 1000 MG CAPS Take 1,000 mg by mouth daily.     FOLIC ACID PO Take by mouth. One daily     furosemide (LASIX) 20 MG tablet Take 40 mg by mouth daily.     levothyroxine (SYNTHROID) 25 MCG tablet Take 25 mcg by mouth daily.     tamsulosin (FLOMAX)  0.4 MG CAPS capsule Take 0.4 mg by mouth daily.     No current facility-administered medications for this visit.    Allergies as of 07/03/2022 - Review Complete 07/03/2022  Allergen Reaction Noted   Codeine Other (See Comments) 08/01/2016    Family History  Problem Relation Age of Onset   Stroke Father 3       went in the bathroom and collapsed   Multiple sclerosis Sister    Psychiatric Illness Mother 13    Social History   Socioeconomic History   Marital status: Single    Spouse name: Not on file   Number of children: Not on file   Years of education: Not on file   Highest education level: Not on file  Occupational History   Occupation: Retired  Tobacco Use   Smoking status: Every Day    Packs/day: 1.00    Types: Cigarettes    Passive exposure: Current    Smokeless tobacco: Never   Tobacco comments:    ppd  Vaping Use   Vaping Use: Never used  Substance and Sexual Activity   Alcohol use: No   Drug use: Yes    Types: Marijuana   Sexual activity: Not Currently  Other Topics Concern   Not on file  Social History Narrative   Pt lives w/ landlord and [place].   Social Determinants of Health   Financial Resource Strain: Not on file  Food Insecurity: Food Insecurity Present (06/30/2022)   Hunger Vital Sign    Worried About Running Out of Food in the Last Year: Never true    Ran Out of Food in the Last Year: Sometimes true  Transportation Needs: No Transportation Needs (06/30/2022)   PRAPARE - Hydrologist (Medical): No    Lack of Transportation (Non-Medical): No  Physical Activity: Not on file  Stress: Not on file  Social Connections: Not on file   Review of systems General: negative for malaise, night sweats, fever, chills, weight loss Neck: Negative for lumps, goiter, pain and significant neck swelling Resp: Negative for cough, wheezing, dyspnea at rest CV: Negative for chest pain, leg swelling, palpitations, orthopnea GI: denies melena, hematochezia, nausea, vomiting, diarrhea, dysphagia, odyonophagia, early satiety or unintentional weight loss. +constipation  MSK: Negative for joint pain or swelling, back pain, and muscle pain. Derm: Negative for itching or rash Psych: Denies depression, anxiety, memory loss, confusion. No homicidal or suicidal ideation.  Heme: Negative for prolonged bleeding, bruising easily, and swollen nodes. Endocrine: Negative for cold or heat intolerance, polyuria, polydipsia and goiter. Neuro: negative for tremor, gait imbalance, syncope and seizures. The remainder of the review of systems is noncontributory.  Physical Exam: BP 119/61 (BP Location: Right Arm, Patient Position: Sitting, Cuff Size: Large)   Pulse 73   Temp 97.6 F (36.4 C) (Oral)   Ht 6' (1.829 m)   Wt 182  lb 14.4 oz (83 kg)   BMI 24.81 kg/m  General:   Alert and oriented. No distress noted. Pleasant and cooperative.  Head:  Normocephalic and atraumatic. Eyes:  Conjuctiva clear without scleral icterus. Mouth:  Oral mucosa pink and moist. Good dentition. No lesions. Heart: Normal rate and rhythm, s1 and s2 heart sounds present.  Lungs: Clear lung sounds in all lobes. Respirations equal and unlabored. Abdomen:  +BS, soft, non-tender and non-distended. No rebound or guarding. No HSM or masses noted. Rectal: wendy richards, LPN present as witness, skin tags present, no obvious masses or lesions. No abscesses  noted. Sphincter tone WNL. Pt tolerated exam without issue.  Derm: No palmar erythema or jaundice Msk:  Symmetrical without gross deformities. Normal posture. Extremities:  Without edema. Neurologic:  Alert and  oriented x4 Psych:  Alert and cooperative. Normal mood and affect.  Invalid input(s): "6 MONTHS"   ASSESSMENT: John Hicks is a 72 y.o. male presenting today for previous perianal lesion and to schedule colonoscopy.  perianal lesion: previously with a "zit" like lesion that drained purulent fluid and blood after manipulation by patient. Rectal exam today without any lesions present. Suspect perianal abscess that spontaneously resolved. No rectal bleeding, melena or pain.   Notably patient has never had CRC screening via colonoscopy, was scheduled for screening colonscopy in July 2022 and cancelled his appt as he was nervous about proceeding. I discussed the importance of CRC screening with the patient and reassured him regarding his concerns of having colonoscopy. Indications, risks and benefits of procedure discussed in detail with patient. Patient verbalized understanding and is in agreement to proceed with colonoscopy.   Anemia:  macrocytic. Last hgb 10.9.  Followed by hematology. Suspect multifactorial in setting of CKD. No rectal bleeding or melena. Further workup with  hematology pending, iron studies all WNL other than very mildly low iron saturation of 17%. He is not currently on PO iron. Will proceed with colonoscopy, as above. Consider upper endoscopy if he develops overt IDA or further hematology workup is unremarkable.    PLAN:  Schedule Colonoscopy ASA III ENDO 3  2. Continue to follow with Dr. Raliegh Ip for pancytopenia   All questions were answered, patient verbalized understanding and is in agreement with plan as outlined above.   Follow Up: TBD  Izac Faulkenberry L. Alver Sorrow, MSN, APRN, AGNP-C Adult-Gerontology Nurse Practitioner [facility]  I have reviewed the note and agree with the APP's assessment as described in this progress note  Maylon Peppers, MD Gastroenterology and Hepatology [facility]

## 2022-07-04 ENCOUNTER — Telehealth: Payer: Self-pay | Admitting: *Deleted

## 2022-07-04 DIAGNOSIS — Z6824 Body mass index (BMI) 24.0-24.9, adult: Secondary | ICD-10-CM | POA: Diagnosis not present

## 2022-07-04 DIAGNOSIS — K644 Residual hemorrhoidal skin tags: Secondary | ICD-10-CM | POA: Diagnosis not present

## 2022-07-04 DIAGNOSIS — K625 Hemorrhage of anus and rectum: Secondary | ICD-10-CM | POA: Diagnosis not present

## 2022-07-04 DIAGNOSIS — N183 Chronic kidney disease, stage 3 unspecified: Secondary | ICD-10-CM | POA: Diagnosis not present

## 2022-07-04 DIAGNOSIS — Z0001 Encounter for general adult medical examination with abnormal findings: Secondary | ICD-10-CM | POA: Diagnosis not present

## 2022-07-04 DIAGNOSIS — Z125 Encounter for screening for malignant neoplasm of prostate: Secondary | ICD-10-CM | POA: Diagnosis not present

## 2022-07-04 DIAGNOSIS — D518 Other vitamin B12 deficiency anemias: Secondary | ICD-10-CM | POA: Diagnosis not present

## 2022-07-04 DIAGNOSIS — N400 Enlarged prostate without lower urinary tract symptoms: Secondary | ICD-10-CM | POA: Diagnosis not present

## 2022-07-04 DIAGNOSIS — E559 Vitamin D deficiency, unspecified: Secondary | ICD-10-CM | POA: Diagnosis not present

## 2022-07-04 DIAGNOSIS — E039 Hypothyroidism, unspecified: Secondary | ICD-10-CM | POA: Diagnosis not present

## 2022-07-04 DIAGNOSIS — Z1331 Encounter for screening for depression: Secondary | ICD-10-CM | POA: Diagnosis not present

## 2022-07-04 LAB — ANTINUCLEAR ANTIBODIES, IFA: ANA Ab, IFA: NEGATIVE

## 2022-07-04 NOTE — Telephone Encounter (Signed)
[facility]  Screening colonoscopy w/Dr.Castaneda. ASA 3

## 2022-07-05 ENCOUNTER — Encounter: Payer: Self-pay | Admitting: *Deleted

## 2022-07-05 ENCOUNTER — Other Ambulatory Visit: Payer: Self-pay | Admitting: *Deleted

## 2022-07-05 MED ORDER — PEG 3350-KCL-NA BICARB-NACL 420 G PO SOLR: 4000.0000 mL | Freq: Once | ORAL | 0 refills | Status: AC

## 2022-07-06 ENCOUNTER — Inpatient Hospital Stay: Payer: PPO | Admitting: Licensed Clinical Social Worker

## 2022-07-06 DIAGNOSIS — D72819 Decreased white blood cell count, unspecified: Secondary | ICD-10-CM

## 2022-07-06 LAB — METHYLMALONIC ACID, SERUM: Methylmalonic Acid, Quantitative: 279 nmol/L (ref 0–378)

## 2022-07-06 NOTE — Progress Notes (Signed)
[facility]  Initial Assessment   John Hicks is a 72 y.o. year old male contacted by phone. Clinical Social Work was referred by medical provider for assessment of psychosocial needs.   SDOH (Social Determinants of Health) assessments performed: Yes SDOH Interventions    Flowsheet Row Office Visit from 06/30/2022 in [facility] [place] Interventions   Housing Interventions Intervention Not Indicated  Transportation Interventions Intervention Not Indicated  Utilities Interventions Intervention Not Indicated       SDOH Screenings   Food Insecurity: Food Insecurity Present (06/30/2022)  Housing: Low Risk  (06/30/2022)  Transportation Needs: No Transportation Needs (06/30/2022)  Utilities: Not At Risk (06/30/2022)  Depression (PHQ2-9): Low Risk  (06/30/2022)  Tobacco Use: High Risk (07/03/2022)     Distress Screen completed: No     No data to display            Family/Social Information:  Housing Arrangement: patient lives alone, but his land lord resides next door and she is also pt's cousin. Family members/support persons in your life? Pt has another cousin who resides locally and both cousins are listed as pt's emergency contacts in pt's chart.  Other than that pt does not have significant support. Transportation concerns: no  Employment: Retired pt also served in [employer].  Per pt he used to get his medical care at the [place], but was very dissatisfied with the care he received and now uses private doctors.  Income source: Paediatric nurse concerns:  Pt has a fixed income and is at present able to make ends meet, but not able to extend beyond that if an emergent need should arise. Type of concern: None Food access concerns: no Religious or spiritual practice: Yes-Christian Services Currently in place:  social security  Coping/ Adjustment to diagnosis: Patient understands treatment plan and what happens next? Pt is awaiting  blood work results to determine if additional work up is needed. Concerns about diagnosis and/or treatment:  no diagnosis has been made as of yet Patient reported stressors: Anxiety/ nervousness Hopes and/or priorities: Pt's priority is to go to remaining doctor's appointments w/ the hope of good news Patient enjoys  not addressed Current coping skills/ strengths: Motivation for treatment/growth  and Physical Health     SUMMARY: Current SDOH Barriers:  Financial constraints related to fixed income  Clinical Social Work Clinical Goal(s):  No clinical social work goals at this time  Interventions: Discussed common feeling and emotions when being diagnosed with cancer, and the importance of support during treatment Informed patient of the support team roles and support services at [facility] Provided [place] contact information and encouraged patient to call with any questions or concerns    Follow Up Plan: Patient will contact CSW with any support or resource needs Patient verbalizes understanding of plan: Yes    Henriette Combs, LCSW

## 2022-07-07 LAB — COPPER, SERUM: Copper: 107 ug/dL (ref 69–132)

## 2022-07-10 ENCOUNTER — Inpatient Hospital Stay: Payer: PPO | Admitting: Licensed Clinical Social Worker

## 2022-07-10 DIAGNOSIS — D696 Thrombocytopenia, unspecified: Secondary | ICD-10-CM

## 2022-07-10 NOTE — Progress Notes (Signed)
[facility] CSW Progress Note  Clinical Education officer, museum  received a call from pt requesting his follow up appointment on March 6th be moved to a later date as he will be prepping for a colonoscopy that is scheduled for the 7th.  CSW sent a message to PA to see if his appointment can be changed.        Henriette Combs, LCSW

## 2022-07-13 IMAGING — CT CT ANGIO CHEST
1 of 12 series · 12 of 38 positions shown · IV contrast (Omnipaque or Isovue)
Comparison: Cardiac MRI March 2021, CTA abdomen and pelvis
June 2019

CLINICAL DATA: Thoracic aortic aneurysm ([place]), follow up

EXAM:
CT ANGIOGRAPHY CHEST WITH CONTRAST
TECHNIQUE: Multidetector CT imaging of the chest was performed using the
standard protocol during bolus administration of intravenous
contrast. Multiplanar CT image reconstructions and MIPs were
obtained to evaluate the vascular anatomy.

[Series 8: lungs · axial · 0.77mm/px · z∈[-304,+4]mm · 12 of 184 slices shown]
[im 15/184  lung]
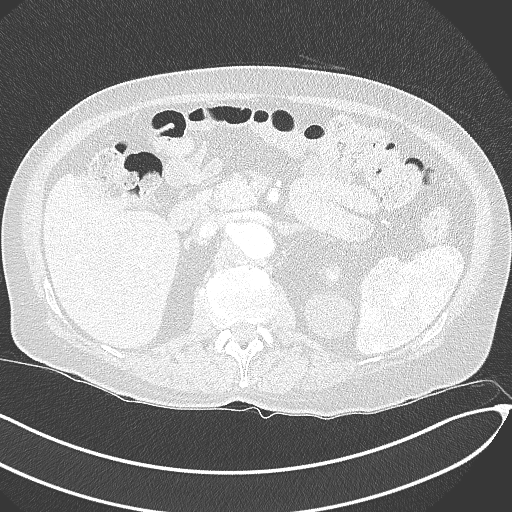
[im 29/184  mediastinal]
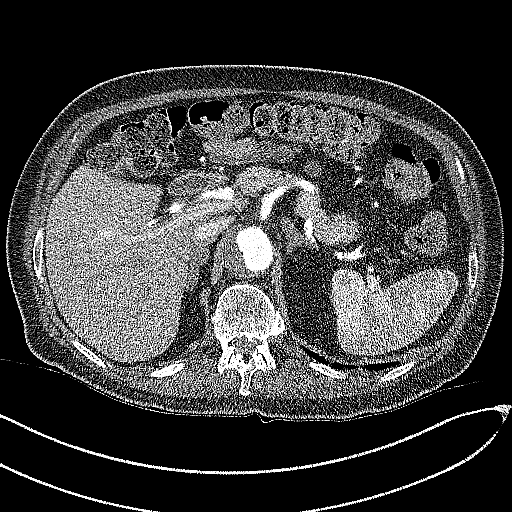
[im 43/184  lung]
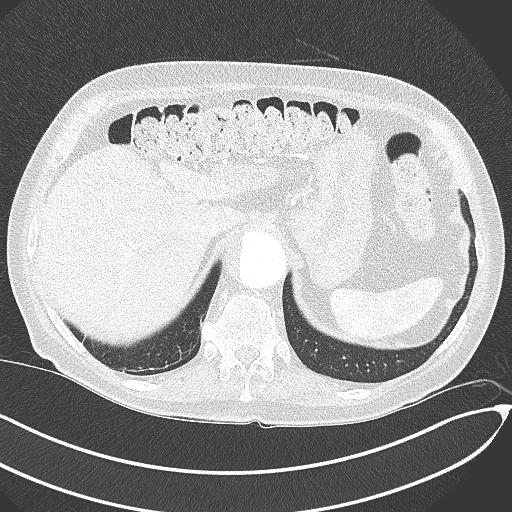
[im 57/184  mediastinal]
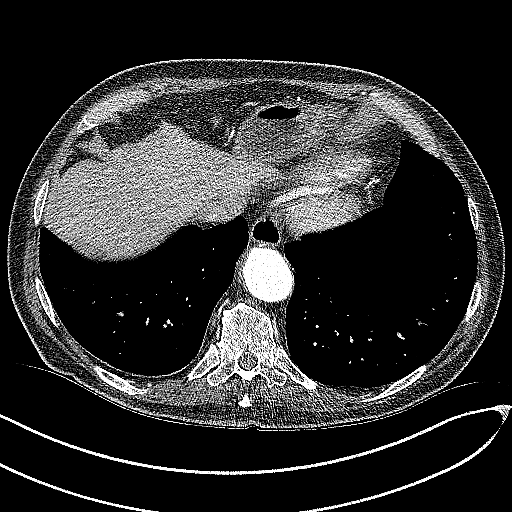
[im 71/184  lung]
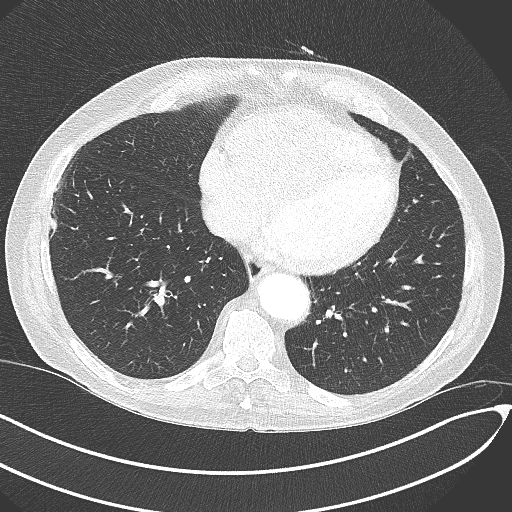
[im 85/184  mediastinal]
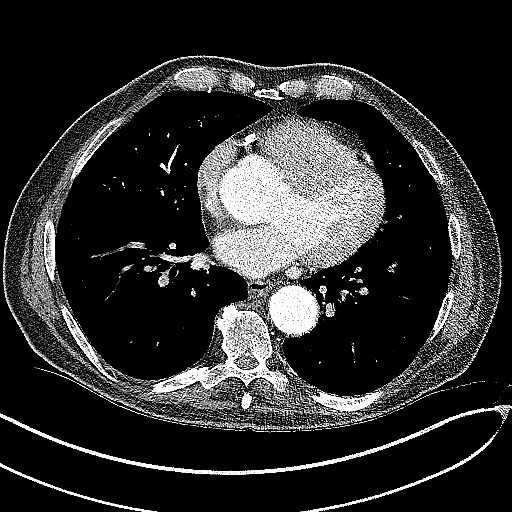
[im 99/184  lung]
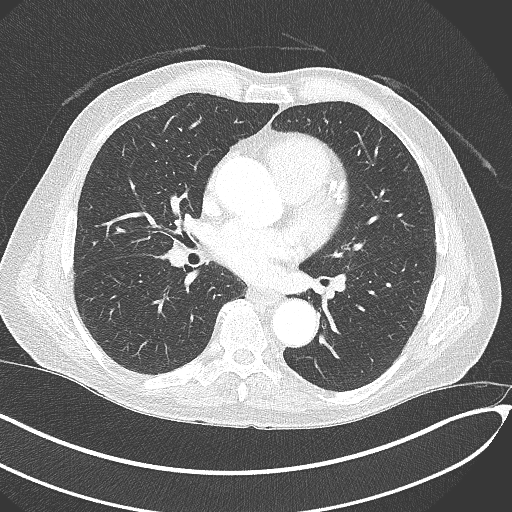
[im 113/184  mediastinal]
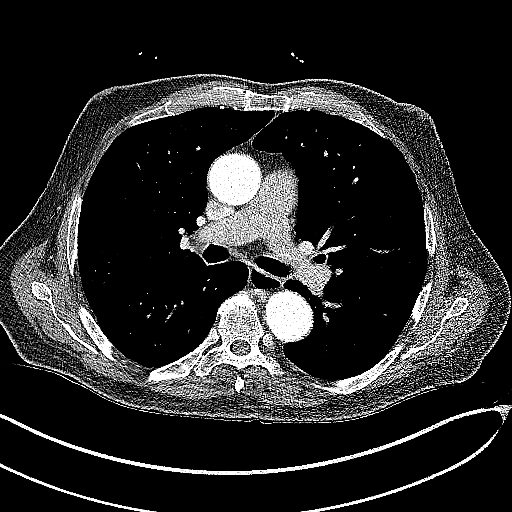
[im 127/184  lung]
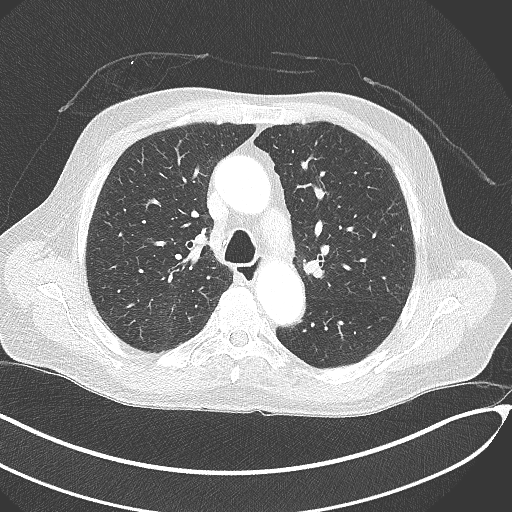
[im 141/184  mediastinal]
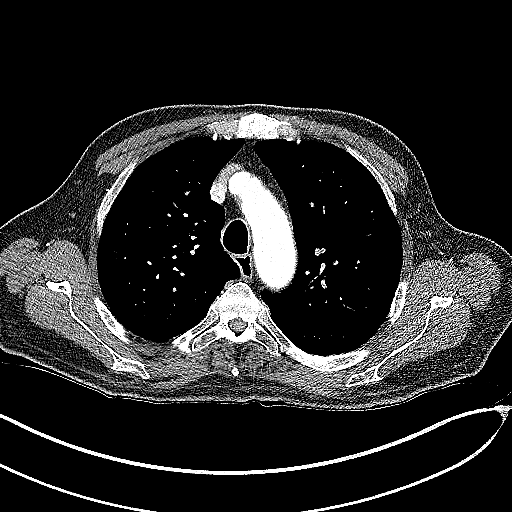
[im 155/184  lung]
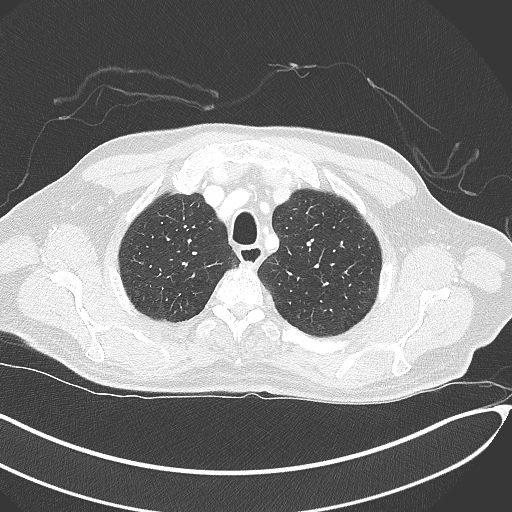
[im 169/184  mediastinal]
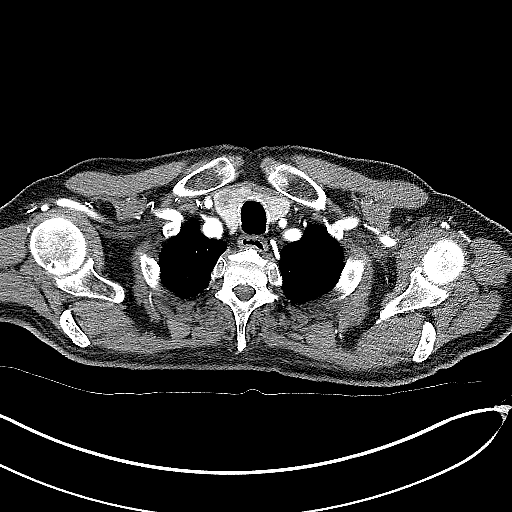

[12 of 38 positions shown; findings below may reference images not displayed]

RADIATION DOSE REDUCTION: This exam was performed according to the
departmental dose-optimization program which includes automated
exposure control, adjustment of the mA and/or kV according to
patient size and/or use of iterative reconstruction technique.

CONTRAST:  75mL OMNIPAQUE IOHEXOL 350 MG/ML SOLN
FINDINGS: Cardiovascular: Preferential opacification of the thoracic aorta.
Measurements of the thoracic aorta are taken as follows:

Aortic root: 6.3 x 5.4 cm

[place] junction: 3.9 x 3.8 cm

Ascending thoracic aorta 3.9 x 3.8 cm

Aortic arch 3.3 x 3.2 cm

Descending thoracic aorta, mid: 3.4 x 3.3 cm

Descending thoracic aorta, distal 4.7 x 4.4 cm

Coronary artery calcifications. No pericardial effusion. The
pulmonary arteries are normal caliber.

Mediastinum/Nodes: No enlarged mediastinal, hilar, or axillary lymph
nodes. There is a 1.6 cm nodule in the right thyroid lobe.

Lungs/Pleura: No pleural effusion. No pneumothorax. No mass or focal
consolidation. No suspicious pulmonary nodules.

Musculoskeletal: No aggressive osseous lesions. Scattered
degenerative changes of the lower thoracic spine.

Upper abdomen: There is a 1.2 cm right adrenal gland nodule,
unchanged from prior CT in June 2019 where it was found to
measure less than 10 Hounsfield units, compatible with a benign
lipid rich adrenal adenoma. Partially visualized previous AAA
repair. Stable left upper pole simple renal cyst.

Review of the MIP images confirms the above findings.
IMPRESSION: 1. There is an aortic root aneurysm measuring 6.3 x 5.4 cm.
Consultation with cardiothoracic/vascular surgery is recommended.
2. Distal descending thoracic aortic aneurysm measures up to 4.7 cm
at its widest point near the diaphragmatic hiatus, contiguous with
previously treated abdominal aortic aneurysm. The ascending thoracic
aorta measures at the upper limit of normal at 3.9 cm, and the
aortic arch is normal in caliber.
3. There is a 1.6 cm nodule in the right thyroid lobe. If not
previously evaluated, dedicated thyroid ultrasound is recommended.

## 2022-07-24 NOTE — Patient Instructions (Signed)
John Hicks  07/24/2022     '@PREFPERIOPPHARMACY'$ @   Your procedure is scheduled on  07/27/2022.   Report to [facility] at  [place]  A.M.   Call this number if you have problems the morning of surgery:  [phone]  If you experience any cold or flu symptoms such as cough, fever, chills, shortness of breath, etc. between now and your scheduled surgery, please notify us at the above number.   Remember:  Follow the diet and prep instructions given to you by the office.     Take these medicines the morning of surgery with A SIP OF WATER      xanax(if needed), pacerone, carvedilol, levothyroxine.     Do not wear jewelry, make-up or nail polish.  Do not wear lotions, powders, or perfumes, or deodorant.  Do not shave 48 hours prior to surgery.  Men may shave face and neck.  Do not bring valuables to the hospital.  [facility] is not responsible for any belongings or valuables.  Contacts, dentures or bridgework may not be worn into surgery.  Leave your suitcase in the car.  After surgery it may be brought to your room.  For patients admitted to the hospital, discharge time will be determined by your treatment team.  Patients discharged the day of surgery will not be allowed to drive home and must have someone with them for 24 hours.    Special instructions:   DO NOT smoke tobacco or vape for 24 hours before your procedure.  Please read over the following fact sheets that you were given. Anesthesia Post-op Instructions and Care and Recovery After Surgery      Colonoscopy, Adult, Care After The following information offers guidance on how to care for yourself after your procedure. Your health care provider may also give you more specific instructions. If you have problems or questions, contact your health care provider. What can I expect after the procedure? After the procedure, it is common to have: A small amount of blood in your stool for 24 hours after the  procedure. Some gas. Mild cramping or bloating of your abdomen. Follow these instructions at home: Eating and drinking  Drink enough fluid to keep your urine pale yellow. Follow instructions from your health care provider about eating or drinking restrictions. Resume your normal diet as told by your health care provider. Avoid heavy or fried foods that are hard to digest. Activity Rest as told by your health care provider. Avoid sitting for a long time without moving. Get up to take short walks every 1-2 hours. This is important to improve blood flow and breathing. Ask for help if you feel weak or unsteady. Return to your normal activities as told by your health care provider. Ask your health care provider what activities are safe for you. Managing cramping and bloating  Try walking around when you have cramps or feel bloated. If directed, apply heat to your abdomen as told by your health care provider. Use the heat source that your health care provider recommends, such as a moist heat pack or a heating pad. Place a towel between your skin and the heat source. Leave the heat on for 20-30 minutes. Remove the heat if your skin turns bright red. This is especially important if you are unable to feel pain, heat, or cold. You have a greater risk of getting burned. General instructions If you were given a sedative during the procedure, it  can affect you for several hours. Do not drive or operate machinery until your health care provider says that it is safe. For the first 24 hours after the procedure: Do not sign important documents. Do not drink alcohol. Do your regular daily activities at a slower pace than normal. Eat soft foods that are easy to digest. Take over-the-counter and prescription medicines only as told by your health care provider. Keep all follow-up visits. This is important. Contact a health care provider if: You have blood in your stool 2-3 days after the procedure. Get  help right away if: You have more than a small spotting of blood in your stool. You have large blood clots in your stool. You have swelling of your abdomen. You have nausea or vomiting. You have a fever. You have increasing pain in your abdomen that is not relieved with medicine. These symptoms may be an emergency. Get help right away. Call 911. Do not wait to see if the symptoms will go away. Do not drive yourself to the hospital. Summary After the procedure, it is common to have a small amount of blood in your stool. You may also have mild cramping and bloating of your abdomen. If you were given a sedative during the procedure, it can affect you for several hours. Do not drive or operate machinery until your health care provider says that it is safe. Get help right away if you have a lot of blood in your stool, nausea or vomiting, a fever, or increased pain in your abdomen. This information is not intended to replace advice given to you by your health care provider. Make sure you discuss any questions you have with your health care provider. Document Revised: 12/29/2020 Document Reviewed: 12/29/2020 Elsevier Patient Education  [place] After The following information offers guidance on how to care for yourself after your procedure. Your health care provider may also give you more specific instructions. If you have problems or questions, contact your health care provider. What can I expect after the procedure? After the procedure, it is common to have: Tiredness. Little or no memory about what happened during or after the procedure. Impaired judgment when it comes to making decisions. Nausea or vomiting. Some trouble with balance. Follow these instructions at home: For the time period you were told by your health care provider:  Rest. Do not participate in activities where you could fall or become injured. Do not drive or use machinery. Do  not drink alcohol. Do not take sleeping pills or medicines that cause drowsiness. Do not make important decisions or sign legal documents. Do not take care of children on your own. Medicines Take over-the-counter and prescription medicines only as told by your health care provider. If you were prescribed antibiotics, take them as told by your health care provider. Do not stop using the antibiotic even if you start to feel better. Eating and drinking Follow instructions from your health care provider about what you may eat and drink. Drink enough fluid to keep your urine pale yellow. If you vomit: Drink clear fluids slowly and in small amounts as you are able. Clear fluids include water, ice chips, low-calorie sports drinks, and fruit juice that has water added to it (diluted fruit juice). Eat light and bland foods in small amounts as you are able. These foods include bananas, applesauce, rice, lean meats, toast, and crackers. General instructions  Have a responsible adult stay with you for the time you  are told. It is important to have someone help care for you until you are awake and alert. If you have sleep apnea, surgery and some medicines can increase your risk for breathing problems. Follow instructions from your health care provider about wearing your sleep device: When you are sleeping. This includes during daytime naps. While taking prescription pain medicines, sleeping medicines, or medicines that make you drowsy. Do not use any products that contain nicotine or tobacco. These products include cigarettes, chewing tobacco, and vaping devices, such as e-cigarettes. If you need help quitting, ask your health care provider. Contact a health care provider if: You feel nauseous or vomit every time you eat or drink. You feel light-headed. You are still sleepy or having trouble with balance after 24 hours. You get a rash. You have a fever. You have redness or swelling around the IV  site. Get help right away if: You have trouble breathing. You have new confusion after you get home. These symptoms may be an emergency. Get help right away. Call 911. Do not wait to see if the symptoms will go away. Do not drive yourself to the hospital. This information is not intended to replace advice given to you by your health care provider. Make sure you discuss any questions you have with your health care provider. Document Revised: 10/03/2021 Document Reviewed: 10/03/2021 Elsevier Patient Education  [place].

## 2022-07-25 ENCOUNTER — Telehealth (INDEPENDENT_AMBULATORY_CARE_PROVIDER_SITE_OTHER): Payer: Self-pay | Admitting: *Deleted

## 2022-07-25 ENCOUNTER — Encounter (HOSPITAL_COMMUNITY): Payer: Self-pay

## 2022-07-25 ENCOUNTER — Encounter (HOSPITAL_COMMUNITY)
Admission: RE | Admit: 2022-07-25 | Discharge: 2022-07-25 | Disposition: A | Discharge status: Home / Self Care | Payer: PPO | Source: Ambulatory Visit | Attending: Gastroenterology | Admitting: Gastroenterology

## 2022-07-25 ENCOUNTER — Telehealth: Payer: Self-pay | Admitting: Cardiology

## 2022-07-25 VITALS — BP 158/73 | HR 71 | Temp 98.8°F | Resp 18 | Ht 72.0 in | Wt 183.0 lb

## 2022-07-25 DIAGNOSIS — Z01818 Encounter for other preprocedural examination: Secondary | ICD-10-CM | POA: Insufficient documentation

## 2022-07-25 DIAGNOSIS — I10 Essential (primary) hypertension: Secondary | ICD-10-CM | POA: Insufficient documentation

## 2022-07-25 DIAGNOSIS — Z79899 Other long term (current) drug therapy: Secondary | ICD-10-CM | POA: Diagnosis not present

## 2022-07-25 DIAGNOSIS — F17200 Nicotine dependence, unspecified, uncomplicated: Secondary | ICD-10-CM | POA: Diagnosis not present

## 2022-07-25 HISTORY — DX: Acute myocardial infarction, unspecified: I21.9

## 2022-07-25 HISTORY — DX: Depression, unspecified: F32.A

## 2022-07-25 HISTORY — DX: Cardiac arrhythmia, unspecified: I49.9

## 2022-07-25 HISTORY — DX: Anxiety disorder, unspecified: F41.9

## 2022-07-25 LAB — BASIC METABOLIC PANEL
Anion gap: 12 (ref 5–15)
BUN: 22 mg/dL (ref 8–23)
CO2: 27 mmol/L (ref 22–32)
Calcium: 9.2 mg/dL (ref 8.9–10.3)
Chloride: 99 mmol/L (ref 98–111)
Creatinine, Ser: 1.58 mg/dL — ABNORMAL HIGH (ref 0.61–1.24)
GFR, Estimated: 47 mL/min — ABNORMAL LOW (ref >60)
Glucose, Bld: 94 mg/dL (ref 70–99)
Potassium: 4 mmol/L (ref 3.5–5.1)
Sodium: 138 mmol/L (ref 135–145)

## 2022-07-25 NOTE — Telephone Encounter (Signed)
Thanks

## 2022-07-25 NOTE — Telephone Encounter (Signed)
    Primary Cardiologist:James Hochrein, MD  Chart reviewed as part of pre-operative protocol coverage. Because of John Hicks past medical history and time since last visit, he/she will require a follow-up office visit in order to better assess preoperative cardiovascular risk.  Pre-op covering staff: - Please schedule appointment and call patient to inform them. - Please contact requesting surgeon's office via preferred method (i.e, phone, fax) to inform them of need for appointment prior to surgery.  If applicable, this message will also be routed to pharmacy pool and/or primary cardiologist for input on holding anticoagulant/antiplatelet agent as requested below so that this information is available at time of patient's appointment.   Deberah Pelton, NP  07/25/2022, 11:36 AM

## 2022-07-25 NOTE — Telephone Encounter (Signed)
   Pre-operative Risk Assessment    Patient Name: John Hicks  DOB: [birth year] MRN: [id number]      Request for Surgical Clearance    Procedure:  Colonoscopy Screening  Date of Surgery: 07-27-22                                  Surgeon:  Dr Jenetta Downer Surgeon's Group or Practice Name:   Phone number:  [phone] Fax number:  [phone]   Type of Clearance Requested:   - Medical    Type of Anesthesia:   Propofol   Additional requests/questions:    Signed, Glyn Ade   07/25/2022, 11:24 AM

## 2022-07-25 NOTE — Telephone Encounter (Signed)
Placed in recall for June

## 2022-07-25 NOTE — Telephone Encounter (Signed)
Patient at pre-op today and per tracey pre-op nurse "He's telling me he need cardiac surgery due to a leaking valve but cardiology is telling him  that he wont make it through the surgery due to his heart being too weak"  He has upcoming April appt with cardiology  Per Dr. Jenetta Downer "lets wait for cardiology input before rescheduling him". Can you add him to May/June recall to follow up with him? Thanks!

## 2022-07-25 NOTE — Telephone Encounter (Signed)
I s/w the pt and he tells me that they are going to hold off on procedure until after he see's Dr. Percival Spanish. I will update all parties involved.

## 2022-07-26 ENCOUNTER — Ambulatory Visit: Payer: PPO | Admitting: Physician Assistant

## 2022-07-26 NOTE — Progress Notes (Signed)
TCS is cancelled for now.  Patient does not have a ride and will need cardiac clearance before rescheduling per [facility].

## 2022-07-27 ENCOUNTER — Encounter (HOSPITAL_COMMUNITY): Payer: Self-pay

## 2022-07-27 ENCOUNTER — Ambulatory Visit (HOSPITAL_COMMUNITY): Admit: 2022-07-27 | Payer: PPO | Admitting: Gastroenterology

## 2022-07-27 SURGERY — COLONOSCOPY WITH PROPOFOL
Anesthesia: Monitor Anesthesia Care

## 2022-08-01 ENCOUNTER — Ambulatory Visit: Payer: PPO | Admitting: Physician Assistant

## 2022-08-01 NOTE — Progress Notes (Unsigned)
John Hicks, [place] [address]   CLINIC:  Medical Oncology/Hematology  PCP:  Derek Jack, [place] [address] [phone]   REASON FOR VISIT:  Follow-up for pancytopenia  CURRENT THERAPY: Under workup  INTERVAL HISTORY:   John Hicks 72 y.o. male returns for routine follow-up of his pancytopenia.  He was seen for initial consultation by Dr. Delton Coombes on 06/30/2022.  On today's visit, he reports feeling fairly well.  He denies any changes in his symptoms or baseline health status since his initial visit with Dr. Delton Coombes last month.  His energy and appetite are much better now. He denies any abnormal weight loss.  He reports hematuria in the past, but denies any recent hematuria, epistaxis, melena, hematochezia, or other evidence of bleeding.  He denies any fever, chills, night sweats, unintentional weight loss.  He denies any known history of liver disease.  He reports intermittent heavy drinking several years ago, would "get drunk with the boys" about once a month.  He denies any alcohol consumption within the past 10 years.  He has 100% energy and 100% appetite. He endorses that he is maintaining a stable weight.  ASSESSMENT & PLAN:  1.  Pancytopenia - Patient seen at the request of Dr. Theador Hawthorne with CBC from 05/16/2022 showing WBC 2.7/ANC 1.4, platelets 117, hemoglobin 11.1/MCV 98.  Patient denied any previous issues with leukopenia or thrombocytopenia, but has had mild intermittent anemia since at least 2018. - CT (06/27/2019): Spleen size normal.  Liver unremarkable. - Labs by Dr. Theador Hawthorne showed negative SPEP and immunofixation in April 2023.  Light chains with elevated kappa 61.8/lambda 34.4, mildly elevated ratio 1.80 (likely related to CKD stage IIIb) - No prior history of transfusion. - No prior colonoscopy, but is seeing GI to get this scheduled. - Denies any new medications or recent infections. - Hematology workup  (06/30/2022):  Normal B12, MMA, folate, copper Adequate iron stores with ferritin 108, mild functional iron deficiency (saturation 17%) Baseline CKD stage IIIb (creatinine 1.58/GFR 47) Negative ANA, rheumatoid factor, LDH.  Reticulocytes 2.5%. Hepatitis B and hepatitis C negative - Most recent CBC/differential (06/30/2022): WBC 2.1/ANC 0.9, platelets 140, Hgb 10.9/MCV 102.5 - DIFFERENTIAL DIAGNOSIS: Anemia may be attributable to CKD and functional iron deficiency, but no apparent etiology of leukopenia or thrombocytopenia at this time.  Differential diagnosis includes early MDS. - PLAN: We will repeat CBC/differential today, along with immature platelet fraction. - We will check CT abdomen/pelvis for occult liver disease or occult malignancy.  (If leukopenia and thrombocytopenia has resolved, we will cancel this test) - Phone visit after CT scan to discuss results - If cytopenias are out of proportion to CT scan findings, would consider bone marrow biopsy.  2.  Tobacco use - Patient has been smoking 1 PPD cigarettes x 50 years - He qualifies for annual lung cancer screening and is agreeable to proceed with LDCT chest - PLAN: Scheduled for LDCT chest on 08/23/2022  3.  Other history: - Other PMH includes abdominal aortic aneurysm, atrial fibrillation, BPH, congestive heart failure, CKD, hypertension, hypothyroidism, and history of MI - He is independent of ADLs and IADLs.  He served in [employer] in the 1970s.  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  Current active smoker, 1 pack/day for 50 years. - No family history of leukopenia or malignancies.   PLAN SUMMARY: >> Labs today = CBC/D+ immature platelet fraction >> CT scan  abdomen/pelvis >> PHONE visit after CT scan     REVIEW OF SYSTEMS:   Review of Systems  Constitutional:  Negative for appetite change, chills, diaphoresis, fatigue, fever and unexpected weight change.  HENT:   Positive for trouble  swallowing (Difficulty chewing due to teeth). Negative for lump/mass and nosebleeds.   Eyes:  Negative for eye problems.  Respiratory:  Negative for cough, hemoptysis and shortness of breath.   Cardiovascular:  Negative for chest pain, leg swelling and palpitations.  Gastrointestinal:  Positive for constipation. Negative for abdominal pain, blood in stool, diarrhea, nausea and vomiting.  Genitourinary:  Negative for hematuria.   Skin: Negative.   Neurological:  Positive for numbness (hands). Negative for dizziness, headaches and light-headedness.  Hematological:  Does not bruise/bleed easily.  Psychiatric/Behavioral:  Positive for sleep disturbance.      PHYSICAL EXAM:  ECOG PERFORMANCE STATUS: 0 - Asymptomatic  There were no vitals filed for this visit. There were no vitals filed for this visit. Physical Exam Constitutional:      Appearance: Normal appearance. He is normal weight.  Cardiovascular:     Heart sounds: Normal heart sounds.  Pulmonary:     Breath sounds: Normal breath sounds.  Neurological:     General: No focal deficit present.     Mental Status: Mental status is at baseline.  Psychiatric:        Behavior: Behavior normal. Behavior is cooperative.    PAST MEDICAL/SURGICAL HISTORY:  Past Medical History:  Diagnosis Date   AAA (abdominal aortic aneurysm) (HCC)    AF (atrial fibrillation) (HCC)    Anxiety    BPH (benign prostatic hyperplasia)    CHF (congestive heart failure) (HCC)    Chronic kidney disease    stage 3   Depression    Dyspnea    Dysrhythmia    Hypertension    Hypothyroid    Myocardial infarction [facility])    Past Surgical History:  Procedure Laterality Date   ABDOMINAL AORTIC ENDOVASCULAR STENT GRAFT N/A 02/03/2019   Procedure: ABDOMINAL AORTIC ENDOVASCULAR STENT GRAFT;  Surgeon: Waynetta Sandy, MD;  Location: [facility] OR;  Service: Vascular;  Laterality: N/A;   APPENDECTOMY     KNEE SURGERY     ULTRASOUND GUIDANCE FOR VASCULAR ACCESS   02/03/2019   Procedure: Ultrasound Guidance For Vascular Access;  Surgeon: Waynetta Sandy, MD;  Location: [facility] OR;  Service: Vascular;;    SOCIAL HISTORY:  Social History   Socioeconomic History   Marital status: Single    Spouse name: Not on file   Number of children: Not on file   Years of education: Not on file   Highest education level: Not on file  Occupational History   Occupation: Retired  Tobacco Use   Smoking status: Every Day    Packs/day: 1.00    Types: Cigarettes    Passive exposure: Current   Smokeless tobacco: Never   Tobacco comments:    ppd  Vaping Use   Vaping Use: Never used  Substance and Sexual Activity   Alcohol use: No   Drug use: Yes    Types: Marijuana   Sexual activity: Not Currently  Other Topics Concern   Not on file  Social History Narrative   Pt lives w/ landlord and [place].   Social Determinants of Health   Financial Resource Strain: Not on file  Food Insecurity: Food Insecurity Present (06/30/2022)   Hunger Vital Sign    Worried About Running Out of Food in the Last Year:  Never true    Ran Out of Food in the Last Year: Sometimes true  Transportation Needs: No Transportation Needs (06/30/2022)   PRAPARE - Hydrologist (Medical): No    Lack of Transportation (Non-Medical): No  Physical Activity: Not on file  Stress: Not on file  Social Connections: Not on file  Intimate Partner Violence: Not At Risk (06/30/2022)   Humiliation, Afraid, Rape, and Kick questionnaire    Fear of Current or Ex-Partner: No    Emotionally Abused: No    Physically Abused: No    Sexually Abused: No    FAMILY HISTORY:  Family History  Problem Relation Age of Onset   Stroke Father 35       went in the bathroom and collapsed   Multiple sclerosis Sister    Psychiatric Illness Mother 72    CURRENT MEDICATIONS:  Outpatient Encounter Medications as of 08/02/2022  Medication Sig   ALPRAZolam (XANAX) 1 MG tablet Take 1 mg by  mouth 3 (three) times daily as needed for anxiety.   amiodarone (PACERONE) 200 MG tablet Take 200 mg by mouth daily at 2 PM.   aspirin 81 MG chewable tablet Chew 81 mg by mouth daily at 2 PM.   carvedilol (COREG) 3.125 MG tablet Take 1 tablet (3.125 mg total) by mouth 2 (two) times daily. (Patient taking differently: Take 3.125 mg by mouth daily at 2 PM.)   Cholecalciferol (VITAMIN D-3 PO) Take 1 tablet by mouth daily at 2 PM.   Coenzyme Q10 (COQ10) 200 MG CAPS Take 200 mg by mouth daily at 2 PM.   Flaxseed, Linseed, (FLAXSEED OIL) 1000 MG CAPS Take 1,000 mg by mouth daily at 2 PM.   folic acid (FOLVITE) 1 MG tablet Take 1 mg by mouth daily at 2 PM. One daily   furosemide (LASIX) 20 MG tablet Take 40 mg by mouth daily at 2 PM.   levothyroxine (SYNTHROID) 25 MCG tablet Take 25 mcg by mouth daily at 2 PM.   tamsulosin (FLOMAX) 0.4 MG CAPS capsule Take 0.4 mg by mouth daily at 2 PM.   No facility-administered encounter medications on file as of 08/02/2022.    ALLERGIES:  Allergies  Allergen Reactions   Codeine Other (See Comments)    agitation    LABORATORY DATA:  I have reviewed the labs as listed.  CBC    Component Value Date/Time   WBC 2.1 (L) 06/30/2022 1103   RBC 3.13 (L) 06/30/2022 1103   RBC 3.20 (L) 06/30/2022 1103   HGB 10.9 (L) 06/30/2022 1103   HCT 32.8 (L) 06/30/2022 1103   PLT 140 (L) 06/30/2022 1103   MCV 102.5 (H) 06/30/2022 1103   MCH 34.1 (H) 06/30/2022 1103   MCHC 33.2 06/30/2022 1103   RDW 15.1 06/30/2022 1103   LYMPHSABS 1.0 06/30/2022 1103   MONOABS 0.1 06/30/2022 1103   EOSABS 0.1 06/30/2022 1103   BASOSABS 0.0 06/30/2022 1103      Latest Ref Rng & Units 07/25/2022   10:25 AM 10/21/2021   12:21 PM 06/07/2021   11:50 AM  CMP  Glucose 70 - 99 mg/dL 94  113  102   BUN 8 - 23 mg/dL 22  29  40   Creatinine 0.61 - 1.24 mg/dL 1.58  1.90  2.04   Sodium 135 - 145 mmol/L 138  138  133   Potassium 3.5 - 5.1 mmol/L 4.0  3.8  5.1   Chloride 98 - 111 mmol/L 99  105   99   CO2 22 - 32 mmol/L '27  25  26   '$ Calcium 8.9 - 10.3 mg/dL 9.2  9.1  9.8     DIAGNOSTIC IMAGING:  I have independently reviewed the relevant imaging and discussed with the patient.   WRAP UP:  All questions were answered. The patient knows to call the clinic with any problems, questions or concerns.  Medical decision making: Moderate  Time spent on visit: I spent 20 minutes counseling the patient face to face. The total time spent in the appointment was 30 minutes and more than 50% was on counseling.  Harriett Rush, PA-C  08/02/22 1:44 PM

## 2022-08-02 ENCOUNTER — Inpatient Hospital Stay: Payer: PPO | Attending: Hematology | Admitting: Physician Assistant

## 2022-08-02 ENCOUNTER — Inpatient Hospital Stay: Payer: PPO

## 2022-08-02 ENCOUNTER — Other Ambulatory Visit: Payer: Self-pay | Admitting: *Deleted

## 2022-08-02 VITALS — BP 127/86 | HR 62 | Temp 98.2°F | Resp 16 | Wt 182.3 lb

## 2022-08-02 DIAGNOSIS — I4891 Unspecified atrial fibrillation: Secondary | ICD-10-CM | POA: Insufficient documentation

## 2022-08-02 DIAGNOSIS — I252 Old myocardial infarction: Secondary | ICD-10-CM | POA: Diagnosis not present

## 2022-08-02 DIAGNOSIS — D72819 Decreased white blood cell count, unspecified: Secondary | ICD-10-CM

## 2022-08-02 DIAGNOSIS — Z7982 Long term (current) use of aspirin: Secondary | ICD-10-CM | POA: Diagnosis not present

## 2022-08-02 DIAGNOSIS — Z79899 Other long term (current) drug therapy: Secondary | ICD-10-CM | POA: Diagnosis not present

## 2022-08-02 DIAGNOSIS — N400 Enlarged prostate without lower urinary tract symptoms: Secondary | ICD-10-CM | POA: Diagnosis not present

## 2022-08-02 DIAGNOSIS — D61818 Other pancytopenia: Secondary | ICD-10-CM | POA: Diagnosis not present

## 2022-08-02 DIAGNOSIS — F17210 Nicotine dependence, cigarettes, uncomplicated: Secondary | ICD-10-CM | POA: Insufficient documentation

## 2022-08-02 DIAGNOSIS — I714 Abdominal aortic aneurysm, without rupture, unspecified: Secondary | ICD-10-CM | POA: Insufficient documentation

## 2022-08-02 DIAGNOSIS — D696 Thrombocytopenia, unspecified: Secondary | ICD-10-CM

## 2022-08-02 DIAGNOSIS — I509 Heart failure, unspecified: Secondary | ICD-10-CM | POA: Insufficient documentation

## 2022-08-02 DIAGNOSIS — D631 Anemia in chronic kidney disease: Secondary | ICD-10-CM | POA: Diagnosis not present

## 2022-08-02 DIAGNOSIS — I130 Hypertensive heart and chronic kidney disease with heart failure and stage 1 through stage 4 chronic kidney disease, or unspecified chronic kidney disease: Secondary | ICD-10-CM | POA: Insufficient documentation

## 2022-08-02 DIAGNOSIS — E039 Hypothyroidism, unspecified: Secondary | ICD-10-CM | POA: Insufficient documentation

## 2022-08-02 DIAGNOSIS — N1832 Chronic kidney disease, stage 3b: Secondary | ICD-10-CM | POA: Insufficient documentation

## 2022-08-02 LAB — CBC WITH DIFFERENTIAL/PLATELET
Abs Immature Granulocytes: 0.01 10*3/uL (ref 0.00–0.07)
Basophils Absolute: 0 10*3/uL (ref 0.0–0.1)
Basophils Relative: 1 %
Eosinophils Absolute: 0.1 10*3/uL (ref 0.0–0.5)
Eosinophils Relative: 4 %
HCT: 28.2 % — ABNORMAL LOW (ref 39.0–52.0)
Hemoglobin: 9.5 g/dL — ABNORMAL LOW (ref 13.0–17.0)
Immature Granulocytes: 1 %
Lymphocytes Relative: 52 %
Lymphs Abs: 1.1 10*3/uL (ref 0.7–4.0)
MCH: 35.3 pg — ABNORMAL HIGH (ref 26.0–34.0)
MCHC: 33.7 g/dL (ref 30.0–36.0)
MCV: 104.8 fL — ABNORMAL HIGH (ref 80.0–100.0)
Monocytes Absolute: 0.2 10*3/uL (ref 0.1–1.0)
Monocytes Relative: 8 %
Neutro Abs: 0.7 10*3/uL — ABNORMAL LOW (ref 1.7–7.7)
Neutrophils Relative %: 34 %
Platelets: 123 10*3/uL — ABNORMAL LOW (ref 150–400)
RBC: 2.69 MIL/uL — ABNORMAL LOW (ref 4.22–5.81)
RDW: 15.6 % — ABNORMAL HIGH (ref 11.5–15.5)
WBC: 2.2 10*3/uL — ABNORMAL LOW (ref 4.0–10.5)
nRBC: 0 % (ref 0.0–0.2)

## 2022-08-02 LAB — IMMATURE PLATELET FRACTION: Immature Platelet Fraction: 2.2 % (ref 1.2–8.6)

## 2022-08-02 NOTE — Patient Instructions (Signed)
[place] at [facility] **   You were seen today by Tarri Abernethy PA-C for your low blood counts.    LOW BLOOD COUNTS Your low red blood cells and low hemoglobin may be related to your chronic kidney disease, but we do not yet have a reason for why your platelets and white blood cells are low. We will check labs today to see if your blood counts are still low. We will also schedule you for a CT scan of your abdomen to see if you have any evidence of liver problems that could cause low blood cells. We will discuss the results of your CT scan with a phone visit in about a month.  ** Thank you for trusting me with your healthcare!  I strive to provide all of my patients with quality care at each visit.  If you receive a survey for this visit, I would be so grateful to you for taking the time to provide feedback.  Thank you in advance!  ~ Eviana Sibilia                   Dr. Derek Jack   &   Tarri Abernethy, PA-C   - - - - - - - - - - - - - - - - - -    Thank you for choosing [place] at [facility] to provide your oncology and hematology care.  To afford each patient quality time with our provider, please arrive at least 15 minutes before your scheduled appointment time.   If you have a lab appointment with the [facility] please come in thru the Main Entrance and check in at the main information desk.  You need to re-schedule your appointment should you arrive 10 or more minutes late.  We strive to give you quality time with our providers, and arriving late affects you and other patients whose appointments are after yours.  Also, if you no show three or more times for appointments you may be dismissed from the clinic at the providers discretion.     Again, thank you for choosing [facility].  Our hope is that these requests will decrease the amount of time that you wait before  being seen by our physicians.       _____________________________________________________________  Should you have questions after your visit to [facility], please contact our office at [phone] and follow the prompts.  Our office hours are 8:00 a.m. and 4:30 p.m. Monday - Friday.  Please note that voicemails left after 4:00 p.m. may not be returned until the following business day.  We are closed weekends and major holidays.  You do have access to a nurse 24-7, just call the main number to the clinic [phone] and do not press any options, hold on the line and a nurse will answer the phone.    For prescription refill requests, have your pharmacy contact our office and allow 72 hours.

## 2022-08-03 ENCOUNTER — Telehealth: Payer: Self-pay | Admitting: Physician Assistant

## 2022-08-03 NOTE — Telephone Encounter (Signed)
CBC/differential checked yesterday (08/02/2022) showed worsening pancytopenia with WBC 2.2/ANC 0.7, platelets 123, hemoglobin 9.5/MCV 104.8.  Particularly of concern is his unexplained progressive neutropenia and thrombocytopenia over the past 3 months, since his anemia may be explained by alternative etiologies. I discussed with Dr. Delton Coombes and he agrees that patient would benefit from bone marrow biopsy at this point in time rather than waiting until imaging studies have been completed.  I called patient to discuss these changes to his plan.  We discussed indication for bone marrow biopsy and potential risks and benefits of bone marrow biopsy.  Patient is agreeable to proceed.  He would like bone marrow biopsy to be done at [facility] due to difficulty finding transportation to [place].  Harriett Rush, PA-C 08/03/22 5:20 PM

## 2022-08-04 ENCOUNTER — Ambulatory Visit (HOSPITAL_COMMUNITY)
Admission: RE | Admit: 2022-08-04 | Discharge: 2022-08-04 | Disposition: A | Discharge status: Home / Self Care | Payer: PPO | Source: Ambulatory Visit | Attending: Nephrology | Admitting: Nephrology

## 2022-08-04 DIAGNOSIS — I129 Hypertensive chronic kidney disease with stage 1 through stage 4 chronic kidney disease, or unspecified chronic kidney disease: Secondary | ICD-10-CM | POA: Diagnosis not present

## 2022-08-04 DIAGNOSIS — D638 Anemia in other chronic diseases classified elsewhere: Secondary | ICD-10-CM | POA: Diagnosis not present

## 2022-08-04 DIAGNOSIS — R809 Proteinuria, unspecified: Secondary | ICD-10-CM | POA: Insufficient documentation

## 2022-08-04 DIAGNOSIS — N1832 Chronic kidney disease, stage 3b: Secondary | ICD-10-CM | POA: Diagnosis not present

## 2022-08-17 ENCOUNTER — Inpatient Hospital Stay (HOSPITAL_BASED_OUTPATIENT_CLINIC_OR_DEPARTMENT_OTHER): Payer: PPO | Admitting: Hematology

## 2022-08-17 VITALS — BP 133/82 | HR 50 | Temp 97.6°F | Resp 18

## 2022-08-17 DIAGNOSIS — D61818 Other pancytopenia: Secondary | ICD-10-CM | POA: Diagnosis not present

## 2022-08-17 DIAGNOSIS — D72819 Decreased white blood cell count, unspecified: Secondary | ICD-10-CM | POA: Diagnosis not present

## 2022-08-17 DIAGNOSIS — N1832 Chronic kidney disease, stage 3b: Secondary | ICD-10-CM | POA: Diagnosis not present

## 2022-08-17 DIAGNOSIS — D631 Anemia in chronic kidney disease: Secondary | ICD-10-CM | POA: Diagnosis not present

## 2022-08-17 DIAGNOSIS — D696 Thrombocytopenia, unspecified: Secondary | ICD-10-CM | POA: Diagnosis not present

## 2022-08-17 LAB — CBC WITH DIFFERENTIAL/PLATELET
Abs Immature Granulocytes: 0 10*3/uL (ref 0.00–0.07)
Basophils Absolute: 0 10*3/uL (ref 0.0–0.1)
Basophils Relative: 1 %
Eosinophils Absolute: 0.1 10*3/uL (ref 0.0–0.5)
Eosinophils Relative: 4 %
HCT: 30.4 % — ABNORMAL LOW (ref 39.0–52.0)
Hemoglobin: 10.3 g/dL — ABNORMAL LOW (ref 13.0–17.0)
Immature Granulocytes: 0 %
Lymphocytes Relative: 60 %
Lymphs Abs: 1.2 10*3/uL (ref 0.7–4.0)
MCH: 35.8 pg — ABNORMAL HIGH (ref 26.0–34.0)
MCHC: 33.9 g/dL (ref 30.0–36.0)
MCV: 105.6 fL — ABNORMAL HIGH (ref 80.0–100.0)
Monocytes Absolute: 0.2 10*3/uL (ref 0.1–1.0)
Monocytes Relative: 8 %
Neutro Abs: 0.5 10*3/uL — ABNORMAL LOW (ref 1.7–7.7)
Neutrophils Relative %: 27 %
Platelets: 109 10*3/uL — ABNORMAL LOW (ref 150–400)
RBC: 2.88 MIL/uL — ABNORMAL LOW (ref 4.22–5.81)
RDW: 15.5 % (ref 11.5–15.5)
WBC: 1.9 10*3/uL — ABNORMAL LOW (ref 4.0–10.5)
nRBC: 0 % (ref 0.0–0.2)

## 2022-08-17 NOTE — Progress Notes (Signed)
[place], [place] [address]   CLINIC:  Medical Oncology/Hematology  PCP:  Redmond School, [place] [id number] [phone]   REASON FOR VISIT:  Follow-up for severe leukopenia, moderate thrombocytopenia and macrocytic anemia  PRIOR THERAPY: None  CURRENT THERAPY: Under workup  INTERVAL HISTORY:  Mr. Endrizzi 72 y.o. male seen today for further workup of pancytopenia with macrocytic anemia.  Recent nutritional deficiency workup on 06/30/2022 was nonrevealing.  He denies any bleeding per rectum or melena.  No recurrent infections.    REVIEW OF SYSTEMS:  Review of Systems  Constitutional:  Positive for fatigue.  All other systems reviewed and are negative.    PAST MEDICAL/SURGICAL HISTORY:  Past Medical History:  Diagnosis Date   AAA (abdominal aortic aneurysm) (HCC)    AF (atrial fibrillation) (HCC)    Anxiety    BPH (benign prostatic hyperplasia)    CHF (congestive heart failure) (HCC)    Chronic kidney disease    stage 3   Depression    Dyspnea    Dysrhythmia    Hypertension    Hypothyroid    Myocardial infarction [facility])    Past Surgical History:  Procedure Laterality Date   ABDOMINAL AORTIC ENDOVASCULAR STENT GRAFT N/A 02/03/2019   Procedure: ABDOMINAL AORTIC ENDOVASCULAR STENT GRAFT;  Surgeon: Waynetta Sandy, MD;  Location: [facility] OR;  Service: Vascular;  Laterality: N/A;   APPENDECTOMY     KNEE SURGERY     ULTRASOUND GUIDANCE FOR VASCULAR ACCESS  02/03/2019   Procedure: Ultrasound Guidance For Vascular Access;  Surgeon: Waynetta Sandy, MD;  Location: [facility] OR;  Service: Vascular;;     SOCIAL HISTORY:  Social History   Socioeconomic History   Marital status: Single    Spouse name: Not on file   Number of children: Not on file   Years of education: Not on file   Highest education level: Not on file  Occupational History   Occupation: Retired  Tobacco Use   Smoking status:  Every Day    Packs/day: 1    Types: Cigarettes    Passive exposure: Current   Smokeless tobacco: Never   Tobacco comments:    ppd  Vaping Use   Vaping Use: Never used  Substance and Sexual Activity   Alcohol use: No   Drug use: Yes    Types: Marijuana   Sexual activity: Not Currently  Other Topics Concern   Not on file  Social History Narrative   Pt lives w/ landlord and [place].   Social Determinants of Health   Financial Resource Strain: Not on file  Food Insecurity: Food Insecurity Present (06/30/2022)   Hunger Vital Sign    Worried About Running Out of Food in the Last Year: Never true    Ran Out of Food in the Last Year: Sometimes true  Transportation Needs: No Transportation Needs (06/30/2022)   PRAPARE - Hydrologist (Medical): No    Lack of Transportation (Non-Medical): No  Physical Activity: Not on file  Stress: Not on file  Social Connections: Not on file  Intimate Partner Violence: Not At Risk (06/30/2022)   Humiliation, Afraid, Rape, and Kick questionnaire    Fear of Current or Ex-Partner: No    Emotionally Abused: No    Physically Abused: No    Sexually Abused: No    FAMILY HISTORY:  Family History  Problem Relation Age of Onset   Stroke Father 3  went in the bathroom and collapsed   Multiple sclerosis Sister    Psychiatric Illness Mother 78    CURRENT MEDICATIONS:  Outpatient Encounter Medications as of 08/17/2022  Medication Sig   ALPRAZolam (XANAX) 1 MG tablet Take 1 mg by mouth 3 (three) times daily as needed for anxiety.   amiodarone (PACERONE) 200 MG tablet Take 200 mg by mouth daily at 2 PM.   aspirin 81 MG chewable tablet Chew 81 mg by mouth daily at 2 PM.   carvedilol (COREG) 3.125 MG tablet Take 1 tablet (3.125 mg total) by mouth 2 (two) times daily. (Patient taking differently: Take 3.125 mg by mouth daily at 2 PM.)   Cholecalciferol (VITAMIN D-3 PO) Take 1 tablet by mouth daily at 2 PM.   Coenzyme Q10  (COQ10) 200 MG CAPS Take 200 mg by mouth daily at 2 PM.   Flaxseed, Linseed, (FLAXSEED OIL) 1000 MG CAPS Take 1,000 mg by mouth daily at 2 PM.   folic acid (FOLVITE) 1 MG tablet Take 1 mg by mouth daily at 2 PM. One daily   furosemide (LASIX) 20 MG tablet Take 40 mg by mouth daily at 2 PM.   levothyroxine (SYNTHROID) 25 MCG tablet Take 25 mcg by mouth daily at 2 PM.   tamsulosin (FLOMAX) 0.4 MG CAPS capsule Take 0.4 mg by mouth daily at 2 PM.   No facility-administered encounter medications on file as of 08/17/2022.    ALLERGIES:  Allergies  Allergen Reactions   Codeine Other (See Comments)    agitation     PHYSICAL EXAM:  ECOG Performance status: 1  Vitals:   08/17/22 0727 08/17/22 0813  BP: (!) 140/83 133/82  Pulse: (!) 56 (!) 50  Resp: 18 18  Temp: 98.5 F (36.9 C) 97.6 F (36.4 C)  SpO2: 100% 100%   There were no vitals filed for this visit. Physical Exam Vitals reviewed.  Constitutional:      Appearance: Normal appearance.  Neurological:     Mental Status: He is alert.  Psychiatric:        Mood and Affect: Mood normal.        Behavior: Behavior normal.      LABORATORY DATA:  I have reviewed the labs as listed.  CBC    Component Value Date/Time   WBC 1.9 (L) 08/17/2022 0733   RBC 2.88 (L) 08/17/2022 0733   HGB 10.3 (L) 08/17/2022 0733   HCT 30.4 (L) 08/17/2022 0733   PLT 109 (L) 08/17/2022 0733   MCV 105.6 (H) 08/17/2022 0733   MCH 35.8 (H) 08/17/2022 0733   MCHC 33.9 08/17/2022 0733   RDW 15.5 08/17/2022 0733   LYMPHSABS PENDING 08/17/2022 0733   MONOABS PENDING 08/17/2022 0733   EOSABS PENDING 08/17/2022 0733   BASOSABS PENDING 08/17/2022 0733      Latest Ref Rng & Units 07/25/2022   10:25 AM 10/21/2021   12:21 PM 06/07/2021   11:50 AM  CMP  Glucose 70 - 99 mg/dL 94  113  102   BUN 8 - 23 mg/dL 22  29  40   Creatinine 0.61 - 1.24 mg/dL 1.58  1.90  2.04   Sodium 135 - 145 mmol/L 138  138  133   Potassium 3.5 - 5.1 mmol/L 4.0  3.8  5.1    Chloride 98 - 111 mmol/L 99  105  99   CO2 22 - 32 mmol/L 27  25  26    Calcium 8.9 - 10.3 mg/dL 9.2  9.1  9.8     DIAGNOSTIC IMAGING:  I have independently reviewed the scans and discussed with the patient.  ASSESSMENT:  1.  Leukopenia and mild thrombocytopenia: - Patient seen at the request of Dr. Theador Hawthorne. - CBC (05/16/2022): WBC-2.7, ANC-1.4, PLT-117, Hb-11.1, MCV-98 - No previous history of leukopenia or thrombocytopenia. - Denies any B symptoms or infections. - CT (06/27/2019): Spleen size normal. - Blood work done by Dr. Theador Hawthorne showed negative SPEP and immunofixation.  No prior transfusion history.  Never had colonoscopy, but seeing GI next week.   2.  Social/family history: - He is independent of ADLs and IADLs.  He served in [employer] in the 1970s.  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  Current active smoker, 1 pack/day for 50 years. - No family history of leukopenia or malignancies.  PLAN:  1.  Macrocytic anemia: - Likely combination anemia from CKD, functional iron deficiency and MDS. - Last ferritin was 108, percent saturation 77. - Will consider checking serum EPO level if MDS confirmed.  2.  Severe neutropenia and mild thrombocytopenia: - Nutritional deficiency workup was negative.  Previous CT scan showed normal-sized spleen. - Neutropenia and thrombocytopenia, likely from MDS. - We talked about need for bone marrow aspiration and biopsy.  Will do chromosome analysis and FISH panel for MDS if appropriate. - If MDS confirmed, we will also check NGS panel.  Will also consider [facility] panel.      Orders placed this encounter:  Orders Placed This Encounter  Procedures   CBC with Differential/Platelet      Derek Jack, MD [place] [phone]

## 2022-08-17 NOTE — Progress Notes (Signed)
Patient here today for bone marrow biopsy. Procedure explained and consent signed by all parties at 0728. Patient placed in prone position with both arms above head (0746 any additional positioning notes ). Time out conducted at 0748 ,patient and team agreed. Procedure started at 0751. Patient tolerated procedure well with minimal pain and discomfort. Specimens collected and labeled appropriately. Procedure completed at 0802. Dressing applied and patient reposition on back, head of bed elevated, resting at 0803. Specimens taken to lab for processing. Patient remained stable during procedure and discharged home in stable condition (via ambulatory 0825) at (time 0825).

## 2022-08-17 NOTE — Progress Notes (Signed)
INDICATION: Severe neutropenia, mild thrombocytopenia and macrocytic anemia   Bone Marrow Biopsy and Aspiration Procedure Note   The patient was identified by name and date of birth, prior to start of the procedure and a timeout was performed.   An informed consent was obtained after discussing potential risks including bleeding, infection and pain.  The left posterior iliac crest was palpated, cleaned with ChloraPrep, and drapes applied.  1% lidocaine is infiltrated into the skin, subcutaneous tissue and periosteum.  Bone marrow was aspirated and smears made.  With the help of Jamshidi needle a core biopsy was obtained.  Pressure was applied to the biopsy site and bandage was placed over the biopsy site. Patient was made to lie on the back for 15 mins prior to discharge.  The procedure was tolerated well. COMPLICATIONS: None BLOOD LOSS: none Patient was discharged home in stable condition to return in 2 weeks to review results.  Patient was provided with post bone marrow biopsy instructions and instructed to call if there was any bleeding or worsening pain.  Specimens sent for flow cytometry, cytogenetics and additional studies.  Signed Derek Jack, MD

## 2022-08-18 DIAGNOSIS — N1832 Chronic kidney disease, stage 3b: Secondary | ICD-10-CM | POA: Diagnosis not present

## 2022-08-18 DIAGNOSIS — R809 Proteinuria, unspecified: Secondary | ICD-10-CM | POA: Diagnosis not present

## 2022-08-18 DIAGNOSIS — D61818 Other pancytopenia: Secondary | ICD-10-CM | POA: Diagnosis not present

## 2022-08-18 DIAGNOSIS — D72818 Other decreased white blood cell count: Secondary | ICD-10-CM | POA: Diagnosis not present

## 2022-08-18 DIAGNOSIS — D638 Anemia in other chronic diseases classified elsewhere: Secondary | ICD-10-CM | POA: Diagnosis not present

## 2022-08-18 DIAGNOSIS — D696 Thrombocytopenia, unspecified: Secondary | ICD-10-CM | POA: Diagnosis not present

## 2022-08-18 DIAGNOSIS — N281 Cyst of kidney, acquired: Secondary | ICD-10-CM | POA: Diagnosis not present

## 2022-08-18 DIAGNOSIS — I129 Hypertensive chronic kidney disease with stage 1 through stage 4 chronic kidney disease, or unspecified chronic kidney disease: Secondary | ICD-10-CM | POA: Diagnosis not present

## 2022-08-18 DIAGNOSIS — I5032 Chronic diastolic (congestive) heart failure: Secondary | ICD-10-CM | POA: Diagnosis not present

## 2022-08-21 LAB — SURGICAL PATHOLOGY

## 2022-08-23 ENCOUNTER — Ambulatory Visit (HOSPITAL_COMMUNITY)
Admission: RE | Admit: 2022-08-23 | Discharge: 2022-08-23 | Disposition: A | Discharge status: Home / Self Care | Payer: PPO | Source: Ambulatory Visit | Attending: Physician Assistant | Admitting: Physician Assistant

## 2022-08-23 ENCOUNTER — Other Ambulatory Visit: Payer: Self-pay | Admitting: Physician Assistant

## 2022-08-23 ENCOUNTER — Ambulatory Visit (HOSPITAL_COMMUNITY)
Admission: RE | Admit: 2022-08-23 | Discharge: 2022-08-23 | Disposition: A | Discharge status: Home / Self Care | Payer: PPO | Source: Ambulatory Visit | Attending: Hematology | Admitting: Hematology

## 2022-08-23 DIAGNOSIS — Z122 Encounter for screening for malignant neoplasm of respiratory organs: Secondary | ICD-10-CM | POA: Diagnosis not present

## 2022-08-23 DIAGNOSIS — D3501 Benign neoplasm of right adrenal gland: Secondary | ICD-10-CM | POA: Diagnosis not present

## 2022-08-23 DIAGNOSIS — D696 Thrombocytopenia, unspecified: Secondary | ICD-10-CM | POA: Diagnosis not present

## 2022-08-23 DIAGNOSIS — K3189 Other diseases of stomach and duodenum: Secondary | ICD-10-CM | POA: Diagnosis not present

## 2022-08-23 DIAGNOSIS — M810 Age-related osteoporosis without current pathological fracture: Secondary | ICD-10-CM | POA: Diagnosis not present

## 2022-08-23 DIAGNOSIS — N281 Cyst of kidney, acquired: Secondary | ICD-10-CM | POA: Diagnosis not present

## 2022-08-23 DIAGNOSIS — D72819 Decreased white blood cell count, unspecified: Secondary | ICD-10-CM

## 2022-08-23 DIAGNOSIS — I2510 Atherosclerotic heart disease of native coronary artery without angina pectoris: Secondary | ICD-10-CM | POA: Diagnosis not present

## 2022-08-23 DIAGNOSIS — Z87891 Personal history of nicotine dependence: Secondary | ICD-10-CM | POA: Diagnosis not present

## 2022-08-23 DIAGNOSIS — D469 Myelodysplastic syndrome, unspecified: Secondary | ICD-10-CM

## 2022-08-23 DIAGNOSIS — N400 Enlarged prostate without lower urinary tract symptoms: Secondary | ICD-10-CM | POA: Diagnosis not present

## 2022-08-23 NOTE — Progress Notes (Signed)
Discussed with John Hicks that additional labs are needed to finalize his diagnosis and treatment plan. I offered to discuss preliminary bone marrow biopsy results with patient, but he declined.  Results will be discussed in full with Dr. Delton Coombes at MD follow-up visit 3 weeks after [employer] myeloid panel is obtained.

## 2022-08-24 ENCOUNTER — Other Ambulatory Visit: Payer: Self-pay | Admitting: Physician Assistant

## 2022-08-24 ENCOUNTER — Inpatient Hospital Stay: Payer: PPO | Attending: Hematology

## 2022-08-24 DIAGNOSIS — D638 Anemia in other chronic diseases classified elsewhere: Secondary | ICD-10-CM | POA: Diagnosis not present

## 2022-08-24 DIAGNOSIS — Z7982 Long term (current) use of aspirin: Secondary | ICD-10-CM | POA: Diagnosis not present

## 2022-08-24 DIAGNOSIS — R809 Proteinuria, unspecified: Secondary | ICD-10-CM | POA: Diagnosis not present

## 2022-08-24 DIAGNOSIS — Z79899 Other long term (current) drug therapy: Secondary | ICD-10-CM | POA: Insufficient documentation

## 2022-08-24 DIAGNOSIS — D469 Myelodysplastic syndrome, unspecified: Secondary | ICD-10-CM

## 2022-08-24 DIAGNOSIS — D4622 Refractory anemia with excess of blasts 2: Secondary | ICD-10-CM | POA: Diagnosis not present

## 2022-08-24 DIAGNOSIS — I129 Hypertensive chronic kidney disease with stage 1 through stage 4 chronic kidney disease, or unspecified chronic kidney disease: Secondary | ICD-10-CM | POA: Diagnosis not present

## 2022-08-24 DIAGNOSIS — N281 Cyst of kidney, acquired: Secondary | ICD-10-CM | POA: Diagnosis not present

## 2022-08-24 DIAGNOSIS — F17210 Nicotine dependence, cigarettes, uncomplicated: Secondary | ICD-10-CM | POA: Diagnosis not present

## 2022-08-24 DIAGNOSIS — N1831 Chronic kidney disease, stage 3a: Secondary | ICD-10-CM | POA: Diagnosis not present

## 2022-08-24 DIAGNOSIS — D696 Thrombocytopenia, unspecified: Secondary | ICD-10-CM | POA: Diagnosis not present

## 2022-08-24 DIAGNOSIS — I5032 Chronic diastolic (congestive) heart failure: Secondary | ICD-10-CM | POA: Diagnosis not present

## 2022-08-24 DIAGNOSIS — D72819 Decreased white blood cell count, unspecified: Secondary | ICD-10-CM | POA: Diagnosis not present

## 2022-08-25 ENCOUNTER — Encounter (HOSPITAL_COMMUNITY): Payer: Self-pay | Admitting: Hematology

## 2022-08-25 LAB — ERYTHROPOIETIN: Erythropoietin: 186.1 m[IU]/mL — ABNORMAL HIGH (ref 2.6–18.5)

## 2022-08-25 NOTE — Progress Notes (Signed)
Patient notified of LDCT Lung Cancer Screening Results via mail with the recommendation to follow-up in 12 months. Patient's referring provider has been sent a copy of results. Results are as follows:  IMPRESSION: 1. Lung-RADS 1, negative. Continue annual screening with low-dose chest CT without contrast in 12 months. 2. Mild aneurysmal dilatation of the ascending thoracic aorta measuring 4.1 cm. Recommend annual imaging followup by CTA or MRA. This recommendation follows 2010 ACCF/AHA/AATS/ACR/ASA/SCA/SCAI/SIR/STS/SVM Guidelines for the Diagnosis and Management of Patients with Thoracic Aortic Disease. Circulation. 2010; 121: H997-F414. Aortic aneurysm NOS (ICD10-I71.9) 3. Coronary artery calcifications. 4. Aortic Atherosclerosis (ICD10-I70.0) and Emphysema (ICD10-J43.9).

## 2022-08-28 ENCOUNTER — Encounter (HOSPITAL_COMMUNITY): Payer: Self-pay | Admitting: Hematology

## 2022-08-30 ENCOUNTER — Telehealth: Payer: PPO | Admitting: Physician Assistant

## 2022-08-30 ENCOUNTER — Ambulatory Visit: Payer: PPO | Admitting: Cardiology

## 2022-08-30 ENCOUNTER — Ambulatory Visit: Payer: PPO | Admitting: Physician Assistant

## 2022-09-01 ENCOUNTER — Encounter (HOSPITAL_COMMUNITY): Payer: Self-pay | Admitting: Hematology

## 2022-09-06 DIAGNOSIS — D469 Myelodysplastic syndrome, unspecified: Secondary | ICD-10-CM | POA: Insufficient documentation

## 2022-09-06 NOTE — Progress Notes (Signed)
[facility] [address] [place] [address]    Clinic Day:  09/07/2022  Referring physician: Elfredia Nevins, MD  Patient Care Team: Elfredia Nevins, MD as PCP - General (Internal Medicine) Rollene Rotunda, MD as PCP - Cardiology (Cardiology) Doreatha Massed, MD as Medical Oncologist (Hematology)   ASSESSMENT & PLAN:   Assessment: 1.  MDS with EB 2: - Presentation with severe neutropenia, mild thrombocytopenia and anemia - BMBX (08/17/2022): MDS with excess blasts.  Myeloblasts 17% and monocytes associated dyspoietic changes. - Cytogenetics: 46, XY[10].  AML FISH panel normal. - CEBPA negative.  FLT3 ITD/TKD negative.  NPM1 negative.  T p53 negative. - IDH1 negative.  IDH 2 mutation (p.[id number]) positive. - IPSS-R: Score 5.5, high risk - Serum EPO: 186 - NGS:   2.  Social/family history: - He is independent of ADLs and IADLs.  He served in [employer] in the 1970s.  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  Current active smoker, 1 pack/day for 50 years.  [name]. - No family history of leukopenia or malignancies.   Plan: 1.  MDS with EB 2: - We have reviewed bone marrow biopsy results in detail. - We are waiting for NGS results. - We discussed treatment options with azacitidine.  Will consider adding venetoclax if suboptimal response.  IDH 2 inhibitor enasidenib will be an option after the first line. - Recommend port placement.     No orders of the defined types were placed in this encounter.     I,Katie Daubenspeck,acting as a Neurosurgeon for Doreatha Massed, MD.,have documented all relevant documentation on the behalf of Doreatha Massed, MD,as directed by  Doreatha Massed, MD while in the presence of Doreatha Massed, MD.   I, Doreatha Massed MD, have reviewed the above documentation for accuracy and completeness, and I agree with the above.   Doreatha Massed, MD    4/18/20246:47 PM  CHIEF COMPLAINT:   Diagnosis: severe leukopenia, moderate thrombocytopenia and macrocytic anemia    Prior Therapy: none  Current Therapy:  under workup   HISTORY OF PRESENT ILLNESS:   Oncology History   No history exists.     INTERVAL HISTORY:   John Hicks is a 72 y.o. male presenting to clinic today for follow up of thrombocytopenia and macrocytic anemia. He was last seen by me on 08/17/22.  At his last visit, he underwent bone marrow biopsy. Pathology from the procedure revealed myelodysplastic syndrome with excess blasts (up to 17%). Flow pathology confirmed a significant myeloblastic population of 14%. No monoclonal B-cell population or abnormal T-cell phenotype. Molecular genetics testing showed a IHD2 mutation, all others negative and FISH normal.  He also underwent staging CT scans on 08/23/22. Screening chest CT was negative (Lung-RADS 1). CT A/P showed no acute intra-abdominal or pelvic pathology.  Today, he states that he is doing well overall. His appetite level is at 75%. His energy level is at 75%.  PAST MEDICAL HISTORY:   Past Medical History: Past Medical History:  Diagnosis Date   AAA (abdominal aortic aneurysm)    AF (atrial fibrillation)    Anxiety    BPH (benign prostatic hyperplasia)    CHF (congestive heart failure)    Chronic kidney disease    stage 3   Depression    Dyspnea    Dysrhythmia    Hypertension    Hypothyroid    Myocardial infarction     Surgical History: Past Surgical History:  Procedure Laterality Date   ABDOMINAL AORTIC ENDOVASCULAR STENT GRAFT N/A 02/03/2019   Procedure: ABDOMINAL AORTIC ENDOVASCULAR STENT GRAFT;  Surgeon: Maeola Harman, MD;  Location: [facility] OR;  Service: Vascular;  Laterality: N/A;   APPENDECTOMY     KNEE SURGERY     ULTRASOUND GUIDANCE FOR VASCULAR ACCESS  02/03/2019   Procedure: Ultrasound Guidance For Vascular Access;  Surgeon: Maeola Harman, MD;  Location: [facility] OR;  Service:  Vascular;;    Social History: Social History   Socioeconomic History   Marital status: Single    Spouse name: Not on file   Number of children: Not on file   Years of education: Not on file   Highest education level: Not on file  Occupational History   Occupation: Retired  Tobacco Use   Smoking status: Every Day    Packs/day: 1    Types: Cigarettes    Passive exposure: Current   Smokeless tobacco: Never   Tobacco comments:    ppd  Vaping Use   Vaping Use: Never used  Substance and Sexual Activity   Alcohol use: No   Drug use: Yes    Types: Marijuana   Sexual activity: Not Currently  Other Topics Concern   Not on file  Social History Narrative   Pt lives w/ landlord and [place].   Social Determinants of Health   Financial Resource Strain: Not on file  Food Insecurity: Food Insecurity Present (06/30/2022)   Hunger Vital Sign    Worried About Running Out of Food in the Last Year: Never true    Ran Out of Food in the Last Year: Sometimes true  Transportation Needs: No Transportation Needs (06/30/2022)   PRAPARE - Administrator, Civil Service (Medical): No    Lack of Transportation (Non-Medical): No  Physical Activity: Not on file  Stress: Not on file  Social Connections: Not on file  Intimate Partner Violence: Not At Risk (06/30/2022)   Humiliation, Afraid, Rape, and Kick questionnaire    Fear of Current or Ex-Partner: No    Emotionally Abused: No    Physically Abused: No    Sexually Abused: No    Family History: Family History  Problem Relation Age of Onset   Stroke Father 90       went in the bathroom and collapsed   Multiple sclerosis Sister    Psychiatric Illness Mother 76    Current Medications:  Current Outpatient Medications:    ALPRAZolam (XANAX) 1 MG tablet, Take 1 mg by mouth 3 (three) times daily as needed for anxiety., Disp: , Rfl:    amiodarone (PACERONE) 200 MG tablet, Take 200 mg by mouth daily at 2 PM., Disp: , Rfl: 0   aspirin  81 MG chewable tablet, Chew 81 mg by mouth daily at 2 PM., Disp: , Rfl:    carvedilol (COREG) 3.125 MG tablet, Take 1 tablet (3.125 mg total) by mouth 2 (two) times daily. (Patient taking differently: Take 3.125 mg by mouth daily at 2 PM.), Disp: 180 tablet, Rfl: 3   Cholecalciferol (VITAMIN D-3 PO), Take 1 tablet by mouth daily at 2 PM., Disp: , Rfl:    Coenzyme Q10 (COQ10) 200 MG CAPS, Take 200 mg by mouth daily at 2 PM., Disp: , Rfl:    Flaxseed, Linseed, (FLAXSEED OIL) 1000 MG CAPS, Take 1,000 mg by mouth daily at 2 PM., Disp: , Rfl:    folic acid (FOLVITE) 1 MG tablet, Take 1 mg by mouth daily at 2  PM. One daily, Disp: , Rfl:    furosemide (LASIX) 20 MG tablet, Take 40 mg by mouth daily at 2 PM., Disp: , Rfl:    levothyroxine (SYNTHROID) 25 MCG tablet, Take 25 mcg by mouth daily at 2 PM., Disp: , Rfl:    tamsulosin (FLOMAX) 0.4 MG CAPS capsule, Take 0.4 mg by mouth daily at 2 PM., Disp: , Rfl:    Allergies: Allergies  Allergen Reactions   Codeine Other (See Comments)    agitation    REVIEW OF SYSTEMS:   Review of Systems  Constitutional:  Negative for chills, fatigue and fever.  HENT:   Negative for lump/mass, mouth sores, nosebleeds, sore throat and trouble swallowing.   Eyes:  Negative for eye problems.  Respiratory:  Negative for cough and shortness of breath.   Cardiovascular:  Negative for chest pain, leg swelling and palpitations.  Gastrointestinal:  Positive for constipation. Negative for abdominal pain, diarrhea, nausea and vomiting.  Genitourinary:  Negative for bladder incontinence, difficulty urinating, dysuria, frequency, hematuria and nocturia.   Musculoskeletal:  Negative for arthralgias, back pain, flank pain, myalgias and neck pain.  Skin:  Negative for itching and rash.  Neurological:  Positive for numbness. Negative for dizziness and headaches.  Hematological:  Bruises/bleeds easily.  Psychiatric/Behavioral:  Positive for sleep disturbance. Negative for  depression and suicidal ideas. The patient is not nervous/anxious.   All other systems reviewed and are negative.    VITALS:   Pulse 74, temperature 97.6 F (36.4 C), temperature source Oral, resp. rate 18, weight 177 lb (80.3 kg), SpO2 100 %.  Wt Readings from Last 3 Encounters:  09/07/22 177 lb (80.3 kg)  08/02/22 182 lb 5.1 oz (82.7 kg)  07/25/22 182 lb 15.7 oz (83 kg)    Body mass index is 24.01 kg/m.  Performance status (ECOG): 1 - Symptomatic but completely ambulatory  PHYSICAL EXAM:   Physical Exam Vitals and nursing note reviewed. Exam conducted with a chaperone present.  Constitutional:      Appearance: Normal appearance.  Cardiovascular:     Rate and Rhythm: Normal rate and regular rhythm.     Pulses: Normal pulses.     Heart sounds: Normal heart sounds.  Pulmonary:     Effort: Pulmonary effort is normal.     Breath sounds: Normal breath sounds.  Abdominal:     Palpations: Abdomen is soft. There is no hepatomegaly, splenomegaly or mass.     Tenderness: There is no abdominal tenderness.  Musculoskeletal:     Right lower leg: No edema.     Left lower leg: No edema.  Lymphadenopathy:     Cervical: No cervical adenopathy.     Right cervical: No superficial, deep or posterior cervical adenopathy.    Left cervical: No superficial, deep or posterior cervical adenopathy.     Upper Body:     Right upper body: No supraclavicular or axillary adenopathy.     Left upper body: No supraclavicular or axillary adenopathy.  Neurological:     General: No focal deficit present.     Mental Status: He is alert and oriented to person, place, and time.  Psychiatric:        Mood and Affect: Mood normal.        Behavior: Behavior normal.     LABS:      Latest Ref Rng & Units 09/07/2022   11:02 AM 08/17/2022    7:33 AM 08/02/2022    1:35 PM  CBC  WBC 4.0 - 10.5  K/uL 1.8  1.9  2.2   Hemoglobin 13.0 - 17.0 g/dL 9.6  32.4  9.5   Hematocrit 39.0 - 52.0 % 28.9  30.4  28.2    Platelets 150 - 400 K/uL 100  109  123       Latest Ref Rng & Units 09/07/2022   11:02 AM 07/25/2022   10:25 AM 10/21/2021   12:21 PM  CMP  Glucose 70 - 99 mg/dL 99  94  401   BUN 8 - 23 mg/dL Creatinine 0.61 - 1.24 mg/dL 0.27  2.53  6.64   Sodium 135 - 145 mmol/L 140  138  138   Potassium 3.5 - 5.1 mmol/L 3.7  4.0  3.8   Chloride 98 - 111 mmol/L 105  99  105   CO2 22 - 32 mmol/L Calcium 8.9 - 10.3 mg/dL 9.1  9.2  9.1   Total Protein 6.5 - 8.1 g/dL 7.0     Total Bilirubin 0.3 - 1.2 mg/dL 0.8     Alkaline Phos 38 - 126 U/L 51     AST 15 - 41 U/L 13     ALT 0 - 44 U/L 9        No results found for: "CEA1", "CEA" / No results found for: "CEA1", "CEA" No results found for: "PSA1" No results found for: "QIH474" No results found for: "CAN125"  No results found for: "TOTALPROTELP", "ALBUMINELP", "A1GS", "A2GS", "BETS", "BETA2SER", "GAMS", "MSPIKE", "SPEI" Lab Results  Component Value Date   TIBC 339 06/30/2022   FERRITIN 108 06/30/2022   IRONPCTSAT 17 (L) 06/30/2022   Lab Results  Component Value Date   LDH 145 06/30/2022     STUDIES:   CT CHEST LUNG CANCER SCREENING LOW DOSE WO CONTRAST  Result Date: 08/24/2022 CLINICAL DATA:  Lung cancer screening. Current asymptomatic smoker with 50 pack-year smoking history. EXAM: CT CHEST WITHOUT CONTRAST LOW-DOSE FOR LUNG CANCER SCREENING TECHNIQUE: Multidetector CT imaging of the chest was performed following the standard protocol without IV contrast. RADIATION DOSE REDUCTION: This exam was performed according to the departmental dose-optimization program which includes automated exposure control, adjustment of the mA and/or kV according to patient size and/or use of iterative reconstruction technique. COMPARISON:  CT angio chest 06/02/2021 FINDINGS: Cardiovascular: Aneurysmal dilatation of the aortic root is suboptimally visualized on the current exam reflecting lack of IV contrast material. The ascending thoracic  aorta measures 4.1 cm, image 33/2. Normal heart size. No pericardial effusion. Aortic atherosclerosis and 3 vessel coronary artery calcifications. Mediastinum/Nodes: No enlarged mediastinal, hilar, or axillary lymph nodes. Thyroid gland, trachea, and esophagus demonstrate no significant findings. Lungs/Pleura: No pleural effusion or airspace consolidation. Mild emphysema. Central airways are patent. Mild scarring within the right lung base. No suspicious lung nodules identified. Upper Abdomen: No acute abnormality. Simple cyst off the upper pole of the left kidney measures 3.7 cm. No follow-up imaging recommended. Signs of previous stent graft repair of the abdominal aorta. Musculoskeletal: Spondylosis identified within the thoracic spine. No acute or suspicious osseous findings. IMPRESSION: 1. Lung-RADS 1, negative. Continue annual screening with low-dose chest CT without contrast in 12 months. 2. Mild aneurysmal dilatation of the ascending thoracic aorta measuring 4.1 cm. Recommend annual imaging followup by CTA or MRA. This recommendation follows 2010 ACCF/AHA/AATS/ACR/ASA/SCA/SCAI/SIR/STS/SVM Guidelines for the Diagnosis and Management of Patients with Thoracic Aortic Disease. Circulation. 2010; 121: Q595-G387. Aortic aneurysm NOS (ICD10-I71.9) 3. Coronary artery calcifications.  4. Aortic Atherosclerosis (ICD10-I70.0) and Emphysema (ICD10-J43.9). Electronically Signed   By: Signa Kell M.D.   On: 08/24/2022 13:02   CT Abdomen Pelvis Wo Contrast  Result Date: 08/24/2022 CLINICAL DATA:  Concern for occult liver disease. Leukopenia and thrombocytopenia. EXAM: CT ABDOMEN AND PELVIS WITHOUT CONTRAST TECHNIQUE: Multidetector CT imaging of the abdomen and pelvis was performed following the standard protocol without IV contrast. RADIATION DOSE REDUCTION: This exam was performed according to the departmental dose-optimization program which includes automated exposure control, adjustment of the mA and/or kV  according to patient size and/or use of iterative reconstruction technique. COMPARISON:  CT abdomen pelvis dated 06/27/2019. FINDINGS: Evaluation of this exam is limited in the absence of intravenous contrast. Lower chest: The visualized lung bases are clear. There is coronary vascular calcification. There is hypoattenuation of the cardiac blood pool suggestive of anemia. Clinical correlation is recommended. No intra-abdominal free air or free fluid. Hepatobiliary: Tiny calcified granuloma of the liver. The liver is otherwise unremarkable. No biliary dilatation. The gallbladder is unremarkable. Pancreas: Unremarkable. No pancreatic ductal dilatation or surrounding inflammatory changes. Spleen: Normal in size without focal abnormality. Adrenals/Urinary Tract: There is a 1.3 cm right adrenal adenoma. The left adrenal gland is unremarkable. Mild fullness of the renal collecting systems bilaterally. No stone. Small bilateral renal cysts. The visualized ureters and the bladder appear unremarkable. Stomach/Bowel: There is moderate stool throughout the colon. There is no bowel obstruction or active inflammation. The appendix is not visualized with certainty. No inflammatory changes identified in the right lower quadrant. Vascular/Lymphatic: Status post prior endovascular stent graft repair of infrarenal abdominal aortic aneurysm. Overall the aneurysm including the excluded sac measures up to 6.5 cm in greatest axial diameter, decreased since the prior CT. Evaluation of the vasculature and stent graft is limited in the absence of intravenous contrast. No periaortic fluid collection or inflammatory changes. The IVC is unremarkable. No portal venous gas. There is no adenopathy. Reproductive: Enlarged prostate gland with median lobe hypertrophy protruding into the base of the bladder. Other: None Musculoskeletal: Osteopenia with degenerative changes of the spine. No acute osseous pathology. IMPRESSION: 1. No acute  intra-abdominal or pelvic pathology. 2. Moderate colonic stool burden. No bowel obstruction. 3. Status post prior endovascular stent graft repair of infrarenal abdominal aortic aneurysm. Overall interval decrease in the diameter of the aneurysm since the prior CT. Electronically Signed   By: Elgie Collard M.D.   On: 08/24/2022 01:39

## 2022-09-07 ENCOUNTER — Other Ambulatory Visit: Payer: Self-pay | Admitting: *Deleted

## 2022-09-07 ENCOUNTER — Encounter: Payer: Self-pay | Admitting: Hematology

## 2022-09-07 ENCOUNTER — Inpatient Hospital Stay: Payer: PPO

## 2022-09-07 ENCOUNTER — Inpatient Hospital Stay (HOSPITAL_BASED_OUTPATIENT_CLINIC_OR_DEPARTMENT_OTHER): Payer: PPO | Admitting: Hematology

## 2022-09-07 VITALS — HR 74 | Temp 97.6°F | Resp 18 | Wt 177.0 lb

## 2022-09-07 DIAGNOSIS — D469 Myelodysplastic syndrome, unspecified: Secondary | ICD-10-CM

## 2022-09-07 DIAGNOSIS — D4622 Refractory anemia with excess of blasts 2: Secondary | ICD-10-CM | POA: Diagnosis not present

## 2022-09-07 LAB — COMPREHENSIVE METABOLIC PANEL
ALT: 9 U/L (ref 0–44)
AST: 13 U/L — ABNORMAL LOW (ref 15–41)
Albumin: 3.8 g/dL (ref 3.5–5.0)
Alkaline Phosphatase: 51 U/L (ref 38–126)
Anion gap: 9 (ref 5–15)
BUN: 22 mg/dL (ref 8–23)
CO2: 26 mmol/L (ref 22–32)
Calcium: 9.1 mg/dL (ref 8.9–10.3)
Chloride: 105 mmol/L (ref 98–111)
Creatinine, Ser: 1.79 mg/dL — ABNORMAL HIGH (ref 0.61–1.24)
GFR, Estimated: 40 mL/min — ABNORMAL LOW (ref >60)
Glucose, Bld: 99 mg/dL (ref 70–99)
Potassium: 3.7 mmol/L (ref 3.5–5.1)
Sodium: 140 mmol/L (ref 135–145)
Total Bilirubin: 0.8 mg/dL (ref 0.3–1.2)
Total Protein: 7 g/dL (ref 6.5–8.1)

## 2022-09-07 LAB — CBC
HCT: 28.9 % — ABNORMAL LOW (ref 39.0–52.0)
Hemoglobin: 9.6 g/dL — ABNORMAL LOW (ref 13.0–17.0)
MCH: 35.8 pg — ABNORMAL HIGH (ref 26.0–34.0)
MCHC: 33.2 g/dL (ref 30.0–36.0)
MCV: 107.8 fL — ABNORMAL HIGH (ref 80.0–100.0)
Platelets: 100 10*3/uL — ABNORMAL LOW (ref 150–400)
RBC: 2.68 MIL/uL — ABNORMAL LOW (ref 4.22–5.81)
RDW: 15.3 % (ref 11.5–15.5)
WBC: 1.8 10*3/uL — ABNORMAL LOW (ref 4.0–10.5)
nRBC: 0 % (ref 0.0–0.2)

## 2022-09-07 NOTE — Progress Notes (Signed)
START ON PATHWAY REGIMEN - MDS     A cycle is every 28 days:     Azacitidine   **Always confirm dose/schedule in your pharmacy ordering system**  Patient Characteristics: Higher-Risk, First Line, Not a Transplant Candidate Did cytogenetic and molecular analysis reveal an isolated del(5q) or del(5q) with one other cytogenetic abnormality except monosomy 7 or 7q deletion with no concomitant TP53 mutations<= No Line of therapy: First Line Patient Characteristics: Not a Transplant Candidate Intent of Therapy: Non-Curative / Palliative Intent, Discussed with Patient 

## 2022-09-07 NOTE — Patient Instructions (Addendum)
[facility] at [facility] Discharge Instructions   You were seen and examined today by Dr. Ellin Saba.  He reviewed the results of your lab work and bone marrow biopsy. The bone marrow biopsy is showing that you have a condition called MDS (myelodysplastic syndrome). This is a condition where your bone marrow is not making blood cells appropriately. This condition is a precursor to acute leukemia. This condition of yours will require treatment.   He discussed with you treating you with an infusion called Vidaza. This is an IV infusion that is given in the clinic. You come for treatment on Monday-Friday, then the next week you will come for treatment on Monday and Tuesday. This is repeated every 28 days.   The results of the special blood testing that we sent are still pending. This will also help guide our treatment options in the future.   Treatment with these infusions will require placement of a port a cath to administer the drug. We will arrange for a local surgeon to place this.   Return as scheduled.    Thank you for choosing [facility] at [facility] to provide your oncology and hematology care.  To afford each patient quality time with our provider, please arrive at least 15 minutes before your scheduled appointment time.   If you have a lab appointment with the Cancer Center please come in thru the Main Entrance and check in at the main information desk.  You need to re-schedule your appointment should you arrive 10 or more minutes late.  We strive to give you quality time with our providers, and arriving late affects you and other patients whose appointments are after yours.  Also, if you no show three or more times for appointments you may be dismissed from the clinic at the providers discretion.     Again, thank you for choosing [facility].  Our hope is that these requests will decrease the amount of time that you wait  before being seen by our physicians.       _____________________________________________________________  Should you have questions after your visit to [facility], please contact our office at [phone] and follow the prompts.  Our office hours are 8:00 a.m. and 4:30 p.m. Monday - Friday.  Please note that voicemails left after 4:00 p.m. may not be returned until the following business day.  We are closed weekends and major holidays.  You do have access to a nurse 24-7, just call the main number to the clinic [phone] and do not press any options, hold on the line and a nurse will answer the phone.    For prescription refill requests, have your pharmacy contact our office and allow 72 hours.    Due to Covid, you will need to wear a mask upon entering the hospital. If you do not have a mask, a mask will be given to you at the Main Entrance upon arrival. For doctor visits, patients may have 1 support person age 70 or older with them. For treatment visits, patients can not have anyone with them due to social distancing guidelines and our immunocompromised population.

## 2022-09-08 ENCOUNTER — Other Ambulatory Visit: Payer: Self-pay

## 2022-09-10 DIAGNOSIS — I77810 Thoracic aortic ectasia: Secondary | ICD-10-CM | POA: Insufficient documentation

## 2022-09-10 NOTE — Progress Notes (Unsigned)
Cardiology Office Note:   Date:  09/13/2022  ID:  John Hicks, DOB 1951/05/18, MRN [id number]  History of Present Illness:   John Hicks is a 72 y.o. male who presents for follow up of atrial fib.  He has had endovascular stenting to an AAA in Sept 2020.  He has history of biventricular HF with EF 10 -15% in 2018.  However, EF was was 65% in 2020 with severe LVH.    He had a follow up MRI in Nov.  This demonstrated the EF to be 44%.  The aorta was 55mm.  Hte had moderate to severe MR.  There is LEG suggestive of HCM.    CT demonstrated that his aortic root was 6.3 cm.    Echo in Feb demonstrated severe aortic root dilatation.  AI is moderate.   Most recent 41 mm.  Ejection fraction is slightly reduced at 45 -50%.  He previously missed appointments with surgery.  Trouble getting married.  He did eventually receive.  He has severe RV dysfunction/normal pulmonary pressures.  Overall he is slightly very high risk for surgery with severe RV dysfunction, renal insufficiency ongoing tobacco use.  Unfortunately she has been diagnosed with myelodysplastic syndrome.  He started treatment apparently with azacitidine.  He also has a tooth abscess that is really bothering him.  He is not describing any new shortness of breath PND or orthopnea.  Does not have any new palpitations.  Presyncope or syncope.  No new weight.  No edema.  ROS: Dental abscess.  Otherwise as stated in the HPI and negative for all other systems.  Studies Reviewed:    EKG: Sinus rhythm, rate 59, right bundle branch block, left axis deviation, left anterior fascicular block, no acute ST-T wave changes.  No significant change from previous   Risk Assessment/Calculations:    CHA2DS2-VASc Score = 4  ]\This indicates a 4.8% annual risk of stroke. The patient's score is based upon: CHF History: 1 HTN History: 1 Diabetes History: 0 Stroke History: 0 Vascular Disease History: 1 Age Score: 1 Gender Score: 0         Physical Exam:    VS:  BP (!) 142/82   Pulse (!) 59   Ht 6' (1.829 m)   Wt 181 lb (82.1 kg)   BMI 24.55 kg/m    Wt Readings from Last 3 Encounters:  09/13/22 181 lb (82.1 kg)  09/07/22 177 lb (80.3 kg)  08/02/22 182 lb 5.1 oz (82.7 kg)     GEN: Well nourished, well developed in no acute distress NECK: No JVD; No carotid bruits CARDIAC: RRR, no murmurs, rubs, gallops RESPIRATORY:  Clear to auscultation without rales, wheezing or rhonchi  ABDOMEN: Soft, non-tender, non-distended EXTREMITIES:  No edema; No deformity , decreased pulses DP/PT.   ASSESSMENT AND PLAN:   Aortic root dilatation:   This was 41 mm on CT earlier this month.  It does appear to be larger than previous study.  No change in therapy.  This is previously addressed as above by Dr. Montez Morita.   Essential hypertension:   Slightly elevated.  Trying to avoid ACE or ARB due to his renal insufficiency.  I will increase his carvedilol to 6.25 mg twice daily.    AI:   We are going to follow this with conservative management   Abdominal aortic aneurysm (AAA) without rupture [facility]): He had infrarenal aortic aneurysm repair by Dr. Randie Heinz.  Continue with risk reduction.     Tobacco use disorder: He  is cutting back and understands this is critically important.   PAF (paroxysmal atrial fibrillation) (HCC): He remains in sinus rhythm.  He is not a good candidate for anticoagulation.  He is up-to-date with TSH and liver profile.  No change in therapy.   Diastolic Dysfunction :    He seems to be euvolemic.  No change in therapy.  He has some severe RV dysfunction but again has no overt physical findings or symptoms.    Current medicines are reviewed at length with the patient today.  The patient does not have concerns regarding medicines.      Signed, Rollene Rotunda, MD

## 2022-09-11 LAB — INTELLIGEN MYELOID

## 2022-09-13 ENCOUNTER — Encounter: Payer: Self-pay | Admitting: Cardiology

## 2022-09-13 ENCOUNTER — Ambulatory Visit (INDEPENDENT_AMBULATORY_CARE_PROVIDER_SITE_OTHER): Payer: PPO | Admitting: Cardiology

## 2022-09-13 VITALS — BP 142/82 | HR 59 | Ht 72.0 in | Wt 181.0 lb

## 2022-09-13 DIAGNOSIS — I10 Essential (primary) hypertension: Secondary | ICD-10-CM

## 2022-09-13 DIAGNOSIS — I77810 Thoracic aortic ectasia: Secondary | ICD-10-CM

## 2022-09-13 DIAGNOSIS — I714 Abdominal aortic aneurysm, without rupture, unspecified: Secondary | ICD-10-CM

## 2022-09-13 DIAGNOSIS — I5189 Other ill-defined heart diseases: Secondary | ICD-10-CM

## 2022-09-13 MED ORDER — CARVEDILOL 6.25 MG PO TABS: 6.2500 mg | ORAL_TABLET | Freq: Two times a day (BID) | ORAL | 3 refills | Status: DC

## 2022-09-13 NOTE — Patient Instructions (Addendum)
Medication Instructions:  Please increase your Carvedilol to 6.25 mg twice a day. Continue all other medications as listed.  *If you need a refill on your cardiac medications before your next appointment, please call your pharmacy*  Follow-Up: At [facility], you and your health needs are our priority.  As part of our continuing mission to provide you with exceptional heart care, we have created designated Provider Care Teams.  These Care Teams include your primary Cardiologist (physician) and Advanced Practice Providers (APPs -  Physician Assistants and Nurse Practitioners) who all work together to provide you with the care you need, when you need it.  We recommend signing up for the patient portal called "MyChart".  Sign up information is provided on this After Visit Summary.  MyChart is used to connect with patients for Virtual Visits (Telemedicine).  Patients are able to view lab/test results, encounter notes, upcoming appointments, etc.  Non-urgent messages can be sent to your provider as well.   To learn more about what you can do with MyChart, go to [email or web address].    Your next appointment:   6 month(s)  Provider:   Dr Rollene Rotunda

## 2022-09-19 ENCOUNTER — Telehealth: Payer: Self-pay

## 2022-09-19 NOTE — Telephone Encounter (Signed)
Patient called to let us know he is having eight bottom teeth pulled on October 30, 2022 by Dr. Ross Marcus in [place].  He is scheduled for a port placement on May 2nd and start chemotherapy May 13th.  He is asking if he should cancel/reschedule the port placement and chemotherapy appointment.

## 2022-09-20 ENCOUNTER — Telehealth: Payer: Self-pay

## 2022-09-20 NOTE — Telephone Encounter (Signed)
Called patient with instructions to keep port placement for Sep 21, 2022 and to keep oncology appointments for treatment per Dr. Ellin Saba and we will work with his treatment appointments and his dental extractions in June.  Patient verbalized understanding.

## 2022-09-21 ENCOUNTER — Ambulatory Visit (INDEPENDENT_AMBULATORY_CARE_PROVIDER_SITE_OTHER): Payer: PPO | Admitting: General Surgery

## 2022-09-21 ENCOUNTER — Encounter: Payer: Self-pay | Admitting: General Surgery

## 2022-09-21 ENCOUNTER — Other Ambulatory Visit: Payer: Self-pay

## 2022-09-21 VITALS — BP 158/82 | HR 58 | Temp 97.9°F | Resp 16 | Ht 72.0 in | Wt 189.0 lb

## 2022-09-21 DIAGNOSIS — D469 Myelodysplastic syndrome, unspecified: Secondary | ICD-10-CM

## 2022-09-21 NOTE — Patient Instructions (Addendum)
Per Dr. Antoine Poche -Please take an extra 40 mg Lasix for two days and wear compression stockings  His office will get in touch with you about an appt to check up to make sure your leg swelling is getting better.

## 2022-09-21 NOTE — Progress Notes (Signed)
[facility] History and Physical  Reason for Referral: Port placement for myleodysplastic syndrome  Referring Physician: Dr. Ellin Saba   Chief Complaint   New Patient (Initial Visit)     John Hicks is a 72 y.o. male.  HPI: John Hicks is a very sweet 72 yo who has been found to have myelodysplastic syndrome and needs to start treatment. He has recently started treatment for a tooth abscess that was noted at his right mandibular area and was very large like a golf ball size initially.  He has seen a dentist and started amoxicillin and says this is much improved but he still has 5 days of antibiotics remaining. He says that he needs to get his lower teeth pulled due to decay. He is trying to find an Transport planner. He has  had his upper teeth pulled in the past but could not afford to get the lower teeth pulled at that time.    He follows with Dr. Antoine Poche for his history of heart failure and has been stable but in the last few days he says that he has had more swelling and has been on his normal lasix dosing daily. He says that he has gained some weight with this fluid. He is not on any blood thinners for his A fib as he is considered a poor candidate per Dr. Antoine Poche note.   Past Medical History:  Diagnosis Date   AAA (abdominal aortic aneurysm) (HCC)    AF (atrial fibrillation) (HCC)    Anxiety    BPH (benign prostatic hyperplasia)    CHF (congestive heart failure) (HCC)    Chronic kidney disease    stage 3   Depression    Dyspnea    Dysrhythmia    Hypertension    Hypothyroid    Myocardial infarction [facility])     Past Surgical History:  Procedure Laterality Date   ABDOMINAL AORTIC ENDOVASCULAR STENT GRAFT N/A 02/03/2019   Procedure: ABDOMINAL AORTIC ENDOVASCULAR STENT GRAFT;  Surgeon: Maeola Harman, MD;  Location: [facility] OR;  Service: Vascular;  Laterality: N/A;   APPENDECTOMY     KNEE SURGERY     ULTRASOUND GUIDANCE FOR VASCULAR ACCESS  02/03/2019    Procedure: Ultrasound Guidance For Vascular Access;  Surgeon: Maeola Harman, MD;  Location: [facility] OR;  Service: Vascular;;    Family History  Problem Relation Age of Onset   Stroke Father 53       went in the bathroom and collapsed   Multiple sclerosis Sister    Psychiatric Illness Mother 48    Social History   Tobacco Use   Smoking status: Every Day    Packs/day: 1    Types: Cigarettes    Passive exposure: Current   Smokeless tobacco: Never   Tobacco comments:    ppd  Vaping Use   Vaping Use: Never used  Substance Use Topics   Alcohol use: No   Drug use: Yes    Types: Marijuana    Medications: I have reviewed the patient's current medications. Allergies as of 09/21/2022       Reactions   Codeine Other (See Comments)   agitation        Medication List        Accurate as of Sep 21, 2022  2:37 PM. If you have any questions, ask your nurse or doctor.          ALPRAZolam 1 MG tablet Commonly known as: XANAX Take 1 mg by mouth 3 (  three) times daily as needed for anxiety.   amiodarone 200 MG tablet Commonly known as: PACERONE Take 200 mg by mouth daily at 2 PM.   amoxicillin 875 MG tablet Commonly known as: AMOXIL Take 875 mg by mouth 2 (two) times daily. X10 days   aspirin 81 MG chewable tablet Chew 81 mg by mouth daily at 2 PM.   carvedilol 6.25 MG tablet Commonly known as: COREG Take 1 tablet (6.25 mg total) by mouth 2 (two) times daily.   CoQ10 200 MG Caps Take 200 mg by mouth daily at 2 PM.   Flaxseed Oil 1000 MG Caps Take 1,000 mg by mouth daily at 2 PM.   folic acid 1 MG tablet Commonly known as: FOLVITE Take 1 mg by mouth daily at 2 PM. One daily   furosemide 20 MG tablet Commonly known as: LASIX Take 40 mg by mouth daily at 2 PM.   levothyroxine 25 MCG tablet Commonly known as: SYNTHROID Take 25 mcg by mouth daily at 2 PM.   tamsulosin 0.4 MG Caps capsule Commonly known as: FLOMAX Take 0.4 mg by mouth daily at 2 PM.    VITAMIN D-3 PO Take 1 tablet by mouth daily at 2 PM.         ROS:  A comprehensive review of systems was negative except for: Ears, nose, mouth, throat, and face: positive for abscess of tooth, right mandibular area Cardiovascular: positive for leg swelling bilaterally Hematologic/lymphatic: positive for myleodysplastic disorder  Blood pressure (!) 158/82, pulse (!) 58, temperature 97.9 F (36.6 C), temperature source Oral, resp. rate 16, height 6' (1.829 m), weight 189 lb (85.7 kg), SpO2 96 %. Physical Exam Vitals reviewed.  HENT:     Head: Normocephalic.     Nose: Nose normal.  Eyes:     Pupils: Pupils are equal, round, and reactive to light.  Neck:     Comments: Under the right mandibular area a soft swelling, nontender, no redness   Cardiovascular:     Rate and Rhythm: Normal rate.  Pulmonary:     Effort: Pulmonary effort is normal.  Abdominal:     General: There is no distension.     Palpations: Abdomen is soft.     Tenderness: There is no abdominal tenderness.  Musculoskeletal:        General: Swelling present.     Cervical back: Normal range of motion.     Comments: 2+ pitting edema   Skin:    General: Skin is warm.  Neurological:     General: No focal deficit present.     Mental Status: He is alert.  Psychiatric:        Mood and Affect: Mood normal.        Behavior: Behavior normal.     Results: None   Assessment & Plan:  John Hicks is a 72 y.o. male with myelodysplastic syndrome that needs a port placed. Unfortunately he is dealing with an infection/ tooth abscess right now and is still on his antibiotics. He says he will need to get his lower teeth pulled.   He also seems to have more edema today and has gained about 8 lbs from my visit today and Dr. Antoine Poche visit on 4/24.   I was able ot get in touch with Dr. Antoine Poche. Per Dr. Antoine Poche -Please take an extra 40 mg Lasix for two days and wear compression stockings  His office will get in touch  with you about an appt to check  up to make sure your leg swelling is getting better.   I spoke with Dr. Ellin Saba about trying ot place the port next week 5/10 but that if the infection is still present we may need to postpone.   Discussed risk of port placement, risk of bleeding, infection, risk of injury to vessels, pneumothorax.    All questions were answered to the satisfaction of the patient.  He will let us know if he is not improving from his abscess.    Lucretia Roers 09/21/2022, 2:37 PM

## 2022-09-22 NOTE — H&P (Signed)
[facility] History and Physical   Reason for Referral: Port placement for myleodysplastic syndrome  Referring Physician: Dr. Ellin Saba    Chief Complaint   New Patient (Initial Visit)        John Hicks is a 72 y.o. male.  HPI: John Hicks is a very sweet 72 yo who has been found to have myelodysplastic syndrome and needs to start treatment. He has recently started treatment for a tooth abscess that was noted at his right mandibular area and was very large like a golf ball size initially.  He has seen a dentist and started amoxicillin and says this is much improved but he still has 5 days of antibiotics remaining. He says that he needs to get his lower teeth pulled due to decay. He is trying to find an Transport planner. He has  had his upper teeth pulled in the past but could not afford to get the lower teeth pulled at that time.     He follows with Dr. Antoine Poche for his history of heart failure and has been stable but in the last few days he says that he has had more swelling and has been on his normal lasix dosing daily. He says that he has gained some weight with this fluid. He is not on any blood thinners for his A fib as he is considered a poor candidate per Dr. Antoine Poche note.        Past Medical History:  Diagnosis Date   AAA (abdominal aortic aneurysm) (HCC)     AF (atrial fibrillation) (HCC)     Anxiety     BPH (benign prostatic hyperplasia)     CHF (congestive heart failure) (HCC)     Chronic kidney disease      stage 3   Depression     Dyspnea     Dysrhythmia     Hypertension     Hypothyroid     Myocardial infarction [facility])             Past Surgical History:  Procedure Laterality Date   ABDOMINAL AORTIC ENDOVASCULAR STENT GRAFT N/A 02/03/2019    Procedure: ABDOMINAL AORTIC ENDOVASCULAR STENT GRAFT;  Surgeon: Maeola Harman, MD;  Location: [facility] OR;  Service: Vascular;  Laterality: N/A;   APPENDECTOMY       KNEE SURGERY       ULTRASOUND  GUIDANCE FOR VASCULAR ACCESS   02/03/2019    Procedure: Ultrasound Guidance For Vascular Access;  Surgeon: Maeola Harman, MD;  Location: [facility] OR;  Service: Vascular;;           Family History  Problem Relation Age of Onset   Stroke Father 49        went in the bathroom and collapsed   Multiple sclerosis Sister     Psychiatric Illness Mother 84      Social History         Tobacco Use   Smoking status: Every Day      Packs/day: 1      Types: Cigarettes      Passive exposure: Current   Smokeless tobacco: Never   Tobacco comments:      ppd  Vaping Use   Vaping Use: Never used  Substance Use Topics   Alcohol use: No   Drug use: Yes      Types: Marijuana      Medications: I have reviewed the patient's current medications. Allergies as of 09/21/2022  Reactions    Codeine Other (See Comments)    agitation            Medication List           Accurate as of Sep 21, 2022  2:37 PM. If you have any questions, ask your nurse or doctor.              ALPRAZolam 1 MG tablet Commonly known as: XANAX Take 1 mg by mouth 3 (three) times daily as needed for anxiety.    amiodarone 200 MG tablet Commonly known as: PACERONE Take 200 mg by mouth daily at 2 PM.    amoxicillin 875 MG tablet Commonly known as: AMOXIL Take 875 mg by mouth 2 (two) times daily. X10 days    aspirin 81 MG chewable tablet Chew 81 mg by mouth daily at 2 PM.    carvedilol 6.25 MG tablet Commonly known as: COREG Take 1 tablet (6.25 mg total) by mouth 2 (two) times daily.    CoQ10 200 MG Caps Take 200 mg by mouth daily at 2 PM.    Flaxseed Oil 1000 MG Caps Take 1,000 mg by mouth daily at 2 PM.    folic acid 1 MG tablet Commonly known as: FOLVITE Take 1 mg by mouth daily at 2 PM. One daily    furosemide 20 MG tablet Commonly known as: LASIX Take 40 mg by mouth daily at 2 PM.    levothyroxine 25 MCG tablet Commonly known as: SYNTHROID Take 25 mcg by mouth daily at 2 PM.     tamsulosin 0.4 MG Caps capsule Commonly known as: FLOMAX Take 0.4 mg by mouth daily at 2 PM.    VITAMIN D-3 PO Take 1 tablet by mouth daily at 2 PM.               ROS:  A comprehensive review of systems was negative except for: Ears, nose, mouth, throat, and face: positive for abscess of tooth, right mandibular area Cardiovascular: positive for leg swelling bilaterally Hematologic/lymphatic: positive for myleodysplastic disorder   Blood pressure (!) 158/82, pulse (!) 58, temperature 97.9 F (36.6 C), temperature source Oral, resp. rate 16, height 6' (1.829 m), weight 189 lb (85.7 kg), SpO2 96 %. Physical Exam Vitals reviewed.  HENT:     Head: Normocephalic.     Nose: Nose normal.  Eyes:     Pupils: Pupils are equal, round, and reactive to light.  Neck:     Comments: Under the right mandibular area a soft swelling, nontender, no redness   Cardiovascular:     Rate and Rhythm: Normal rate.  Pulmonary:     Effort: Pulmonary effort is normal.  Abdominal:     General: There is no distension.     Palpations: Abdomen is soft.     Tenderness: There is no abdominal tenderness.  Musculoskeletal:        General: Swelling present.     Cervical back: Normal range of motion.     Comments: 2+ pitting edema   Skin:    General: Skin is warm.  Neurological:     General: No focal deficit present.     Mental Status: He is alert.  Psychiatric:        Mood and Affect: Mood normal.        Behavior: Behavior normal.        Results: None     Assessment & Plan:  John Hicks is a 72 y.o. male with myelodysplastic  syndrome that needs a port placed. Unfortunately he is dealing with an infection/ tooth abscess right now and is still on his antibiotics. He says he will need to get his lower teeth pulled.    He also seems to have more edema today and has gained about 8 lbs from my visit today and Dr. Antoine Poche visit on 4/24.    I was able ot get in touch with Dr. Antoine Poche. Per Dr.  Antoine Poche -Please take an extra 40 mg Lasix for two days and wear compression stockings  His office will get in touch with you about an appt to check up to make sure your leg swelling is getting better.    I spoke with Dr. Ellin Saba about trying ot place the port next week 5/10 but that if the infection is still present we may need to postpone.    Discussed risk of port placement, risk of bleeding, infection, risk of injury to vessels, pneumothorax.      All questions were answered to the satisfaction of the patient.   He will let us know if he is not improving from his abscess.      Lucretia Roers 09/21/2022, 2:37 PM

## 2022-09-26 ENCOUNTER — Encounter (HOSPITAL_COMMUNITY): Payer: Self-pay

## 2022-09-27 ENCOUNTER — Encounter (HOSPITAL_COMMUNITY)
Admission: RE | Admit: 2022-09-27 | Discharge: 2022-09-27 | Disposition: A | Discharge status: Home / Self Care | Payer: PPO | Source: Ambulatory Visit | Attending: General Surgery | Admitting: General Surgery

## 2022-09-27 NOTE — Pre-Procedure Instructions (Signed)
Attempted to call patient and make sure he would have transportation for surgery 09/29/2022 and left him VM to call us back.

## 2022-09-28 ENCOUNTER — Other Ambulatory Visit: Payer: Self-pay

## 2022-09-28 DIAGNOSIS — D469 Myelodysplastic syndrome, unspecified: Secondary | ICD-10-CM

## 2022-09-28 NOTE — Patient Instructions (Signed)
[facility] Chemotherapy Teaching     You are diagnosed with myelodysplastic syndrome.  You will be treated in the clinic on days 1-7 (Monday-Friday, then return Monday and Tuesday of the following week).  That is considered one cycle of chemotherapy will repeat every 28 days (from the first day of each treatment).  The drug we will give you is called azacitadine (Vidaza). The intent of treatment is to control this disease, prevent it from worsening, and to alleviate any symptoms you may be having related to this disease.  You will see the doctor regularly throughout treatment.  We will obtain blood work from you on the first day of treatment and the fifth day of treatment each cycle to monitor your results to make sure it is safe to give your treatment. The doctor monitors your response to treatment by the way you are feeling, your blood work, and by obtaining scans periodically. There will be wait times while you are here for treatment.  It will take about 30 minutes to 1 hour for your lab work to result.  Then there will be wait times while pharmacy mixes your medications.       Medications you will receive in the clinic prior to your chemotherapy medications:   Aloxi:  ALOXI is used in adults to help prevent nausea and vomiting that happens with certain chemotherapy drugs.  Aloxi is a long acting medication, and will remain in your system for about two days.    Dexamethasone:  This is a steroid given prior to chemotherapy to help prevent allergic reactions; it may also help prevent and control nausea and diarrhea.        Azacitidine (Vidaza)   About This Drug Azacitidine is used to treat cancer. It is given in the vein (IV).  It will take 15 minutes to infuse.     Possible Side Effects  Bone marrow suppression. This is a decrease in the number of white blood cells, red blood cells, and platelets. This may raise your risk of infection, make you tired and weak, and raise your risk  of bleeding.    Fever and chills    Nausea and vomiting (throwing up)    Constipation (not able to move bowels)    Diarrhea (loose bowel movements)    Decreased potassium    Weakness    Bruising    Skin and tissue irritation including redness, pain, warmth, or swelling at the injection site.    Petechiae. Tiny red spots on the skin, often from low platelets.   Note: Each of the side effects above was reported in 30% or greater of patients treated with azacitidine. Not all possible side effects are included above.     Warnings and Precautions    Risk of changes in your liver function if you have underlying liver disease.    Changes in your kidney function, which can cause kidney failure and be life-threatening.    Tumor lysis syndrome which can be life-threatening: This drug may act on the cancer cells very quickly. This may affect how your kidneys work.     Important Information    This drug may be present in the saliva, tears, sweat, urine, stool, vomit, semen, and vaginal secretions. Talk to your doctor and/or your nurse about the necessary precautions to take during this time.     Treating Side Effects  Manage tiredness by pacing your activities for the day.    Be sure to include periods of  rest between energy-draining activities.    To help decrease the risk of infections, wash your hands regularly.    Avoid close contact with people who have a cold, the flu, or other infections.    Take your temperature as your doctor or nurse tells you, and whenever you feel like you may have a fever.    To help decrease the risk of bleeding, use a soft toothbrush. Check with your nurse before using dental floss.    Be very careful when using knives or tools.    Use an electric shaver instead of a razor.    Drink plenty of fluids (a minimum of eight glasses per day is recommended).    If you throw up or have diarrhea, you should drink more fluids so that you do not  become dehydrated (lack of water in the body from losing too much fluid).    To help with nausea and vomiting, eat small, frequent meals instead of three large meals a day. Choose foods and drinks that are at room temperature. Ask your nurse or doctor about other helpful tips and medicine that is available to help stop or lessen these symptoms.    If you have diarrhea, eat low-fiber foods that are high in protein and calories and avoid foods that can irritate your digestive tracts or lead to cramping.    If you are not able to move your bowels, check with your doctor or nurse before you use enemas, laxatives, or suppositories.    Ask your nurse or doctor about medicine that can lessen or stop your diarrhea and/or constipation.   Food and Drug Interactions  There are no known interactions of azacitidine with food.    This drug may interact with other medicines. Tell your doctor and pharmacist about all the medicines and dietary supplements (vitamins, minerals, herbs, and others) that you are taking at this time. Also, check with your doctor or pharmacist before starting any new prescription or over-the-counter medicines, or dietary supplements to make sure that there are no interactions.   When to Call the Doctor   Call your doctor or nurse if you have any of these symptoms and/or any new or unusual symptoms:  Fever of 100.4 F (38 C) or higher    Chills    Tiredness that interferes with your daily activities    Feeling dizzy or lightheaded    Easy bleeding or bruising    Nausea that stops you from eating or drinking and/or is not relieved by prescribed medicines    Throwing up    Diarrhea, 4 times in one day or diarrhea with lack of strength or a feeling of being dizzy    No bowel movement in 3 days or when you feel uncomfortable    Pain, redness, or swelling at the site of the injection    Any new tiny red spots on the skin    Decreased and/or dark urine    Signs of  possible liver problems: dark urine, pale bowel movements, pain in your abdomen, feeling very tired and weak, unusual itching, or yellowing of the eyes or skin    Signs of tumor lysis: confusion or agitation, decreased urine, nausea/vomiting, diarrhea, muscle cramping, numbness and/or tingling, seizures    If you think you may be pregnant or may have impregnated your partner   Reproduction Warnings  Pregnancy warning: This drug can have harmful effects on the unborn baby. Women of childbearing potential and men with male partners of  childbearing potential should use effective methods of birth control during your cancer treatment. Let your doctor know right away if you think you may be pregnant or may have impregnated your partner.    Breastfeeding warning: It is not known if this drug passes into breast milk. Women should not breastfeed during treatment because this drug could enter the breast milk and cause harm to a breastfeeding baby.    Fertility warning: In men and women both, this drug may affect your ability to have children in the future. Talk with your doctor or nurse if you plan to have children. Ask for information on sperm or egg banking.     SELF CARE ACTIVITIES WHILE ON CHEMOTHERAPY/IMMUNOTHERAPY:  Hydration Increase your fluid intake and drink at least 64 ounces (2 liters) of water/decaffeinated beverages per day after treatment. You can still have your cup of coffee or soda but these beverages do not count as part of the 64 ounces that you need to drink daily. Limit alcohol intake.  Medications Continue taking your normal prescription medication as prescribed.  If you start any new herbal or new supplements please let us know first to make sure it is safe.  Mouth Care Have teeth cleaned professionally before starting treatment. Keep dentures and partial plates clean. Use soft toothbrush and do not use mouthwashes that contain alcohol. Biotene is a good mouthwash that is  available at most pharmacies or may be ordered by calling [phone]. Use warm salt water gargles (1 teaspoon salt per 1 quart warm water) before and after meals and at bedtime. If you are still having problems with your mouth or sores in your mouth please call the clinic. If you need dental work, please let the doctor know before you go for your appointment so that we can coordinate the best possible time for you in regards to your chemo regimen. You need to also let your dentist know that you are actively taking chemo. We may need to do labs prior to your dental appointment.  Skin Care Always use sunscreen that has not expired and with SPF (Sun Protection Factor) of 50 or higher. Wear hats to protect your head from the sun. Remember to use sunscreen on your hands, ears, face, & feet.  Use good moisturizing lotions such as udder cream, eucerin, or even Vaseline. Some chemotherapies can cause dry skin, color changes in your skin and nails.    Avoid long, hot showers or baths. Use gentle, fragrance-free soaps and laundry detergent. Use moisturizers, preferably creams or ointments rather than lotions because the thicker consistency is better at preventing skin dehydration. Apply the cream or ointment within 15 minutes of showering. Reapply moisturizer at night, and moisturize your hands every time after you wash them.   Infection Prevention Please wash your hands for at least 30 seconds using warm soapy water. Handwashing is the #1 way to prevent the spread of germs. Stay away from sick people or people who are getting over a cold. If you develop respiratory systems such as green/yellow mucus production or productive cough or persistent cough let us know and we will see if you need an antibiotic. It is a good idea to keep a pair of gloves on when going into grocery stores/[employer] to decrease your risk of coming into contact with germs on the carts, etc. Carry alcohol hand gel with you at all times and  use it frequently if out in public. If your temperature reaches 100.5 or higher please call the  clinic and let us know.  If it is after hours or on the weekend please go to the ER if your temperature is over 100.4.  Please have your own personal thermometer at home to use.    Sex and bodily fluids If you are going to have sex, a condom must be used to protect the person that isn't taking immunotherapy. For a few days after treatment, immunotherapy can be excreted through your bodily fluids.  When using the toilet please close the lid and flush the toilet twice.  Do this for a few day after you have had immunotherapy.   Contraception It is not known for sure whether or not immunotherapy drugs can be passed on through semen or secretions from the vagina. Because of this some doctors advise people to use a barrier method if you have sex during treatment. This applies to vaginal, anal or oral sex.  Generally, doctors advise a barrier method only for the time you are actually having the treatment and for about a week after your treatment.  Advice like this can be worrying, but this does not mean that you have to avoid being intimate with your partner. You can still have close contact with your partner and continue to enjoy sex.  Animals If you have cats or birds we ask that you not change the litter or change the cage.  Please have someone else do this for you while you are on immunotherapy.   Food Safety During and After Cancer Treatment Food safety is important for people both during and after cancer treatment. Cancer and cancer treatments, such as chemotherapy, radiation therapy, and stem cell/bone marrow transplantation, often weaken the immune system. This makes it harder for your body to protect itself from foodborne illness, also called food poisoning. Foodborne illness is caused by eating food that contains harmful bacteria, parasites, or viruses.  Foods to avoid Some foods have a higher risk  of becoming tainted with bacteria. These include: Unwashed fresh fruit and vegetables, especially leafy vegetables that can hide dirt and other contaminants Raw sprouts, such as alfalfa sprouts Raw or undercooked beef, especially ground beef, or other raw or undercooked meat and poultry Fatty, fried, or spicy foods immediately before or after treatment.  These can sit heavy on your stomach and make you feel nauseous. Raw or undercooked shellfish, such as oysters. Sushi and sashimi, which often contain raw fish.  Unpasteurized beverages, such as unpasteurized fruit juices, raw milk, raw yogurt, or cider Undercooked eggs, such as soft boiled, over easy, and poached; raw, unpasteurized eggs; or foods made with raw egg, such as homemade raw cookie dough and homemade mayonnaise  Simple steps for food safety  Shop smart. Do not buy food stored or displayed in an unclean area. Do not buy bruised or damaged fruits or vegetables. Do not buy cans that have cracks, dents, or bulges. Pick up foods that can spoil at the end of your shopping trip and store them in a cooler on the way home.  Prepare and clean up foods carefully. Rinse all fresh fruits and vegetables under running water, and dry them with a clean towel or paper towel. Clean the top of cans before opening them. After preparing food, wash your hands for 20 seconds with hot water and soap. Pay special attention to areas between fingers and under nails. Clean your utensils and dishes with hot water and soap. Disinfect your kitchen and cutting boards using 1 teaspoon of liquid, unscented bleach mixed into  1 quart of water.    Dispose of old food. Eat canned and packaged food before its expiration date (the "use by" or "best before" date). Consume refrigerated leftovers within 3 to 4 days. After that time, throw out the food. Even if the food does not smell or look spoiled, it still may be unsafe. Some bacteria, such as Listeria, can grow even  on foods stored in the refrigerator if they are kept for too long.  Take precautions when eating out. At restaurants, avoid buffets and salad bars where food sits out for a long time and comes in contact with many people. Food can become contaminated when someone with a virus, often a norovirus, or another "bug" handles it. Put any leftover food in a "to-go" container yourself, rather than having the server do it. And, refrigerate leftovers as soon as you get home. Choose restaurants that are clean and that are willing to prepare your food as you order it cooked.    SYMPTOMS TO REPORT AS SOON AS POSSIBLE AFTER TREATMENT:  FEVER GREATER THAN 100.4 F CHILLS WITH OR WITHOUT FEVER NAUSEA AND VOMITING THAT IS NOT CONTROLLED WITH YOUR NAUSEA MEDICATION UNUSUAL SHORTNESS OF BREATH UNUSUAL BRUISING OR BLEEDING TENDERNESS IN MOUTH AND THROAT WITH OR WITHOUT PRESENCE OF ULCERS URINARY PROBLEMS BOWEL PROBLEMS UNUSUAL RASH     Wear comfortable clothing and clothing appropriate for easy access to any Portacath or PICC line. Let us know if there is anything that we can do to make your therapy better!   What to do if you need assistance after hours or on the weekends: CALL [phone].  HOLD on the line, do not hang up.  You will hear multiple messages but at the end you will be connected with a nurse triage line.  They will contact the doctor if necessary.  Most of the time they will be able to assist you.  Do not call the hospital operator.    I have been informed and understand all of the instructions given to me and have received a copy. I have been instructed to call the clinic [phone] or my family physician as soon as possible for continued medical care, if indicated. I do not have any more questions at this time but understand that I may call the Cancer Center or the Patient Navigator at [phone] during office hours should I have questions or need assistance in obtaining follow-up  care.

## 2022-09-28 NOTE — Pre-Procedure Instructions (Signed)
Patient call to say he does not have a ride for his surgery tomorrow. He is going to call office to reschedule. I also sent a note to [place] to let her know.

## 2022-09-29 ENCOUNTER — Other Ambulatory Visit: Payer: Self-pay

## 2022-09-29 DIAGNOSIS — D469 Myelodysplastic syndrome, unspecified: Secondary | ICD-10-CM | POA: Diagnosis present

## 2022-10-02 ENCOUNTER — Inpatient Hospital Stay: Payer: PPO | Attending: Hematology | Admitting: Hematology

## 2022-10-02 ENCOUNTER — Inpatient Hospital Stay: Payer: PPO

## 2022-10-02 ENCOUNTER — Encounter: Payer: Self-pay | Admitting: Hematology

## 2022-10-02 ENCOUNTER — Other Ambulatory Visit (HOSPITAL_COMMUNITY): Payer: Self-pay

## 2022-10-02 VITALS — BP 168/86 | HR 53 | Temp 97.3°F | Resp 17 | Ht 72.0 in | Wt 187.4 lb

## 2022-10-02 DIAGNOSIS — Z79899 Other long term (current) drug therapy: Secondary | ICD-10-CM | POA: Insufficient documentation

## 2022-10-02 DIAGNOSIS — D469 Myelodysplastic syndrome, unspecified: Secondary | ICD-10-CM

## 2022-10-02 DIAGNOSIS — Z7982 Long term (current) use of aspirin: Secondary | ICD-10-CM | POA: Insufficient documentation

## 2022-10-02 DIAGNOSIS — D4622 Refractory anemia with excess of blasts 2: Secondary | ICD-10-CM | POA: Insufficient documentation

## 2022-10-02 DIAGNOSIS — F17210 Nicotine dependence, cigarettes, uncomplicated: Secondary | ICD-10-CM | POA: Diagnosis not present

## 2022-10-02 LAB — CBC WITH DIFFERENTIAL/PLATELET
Abs Immature Granulocytes: 0 10*3/uL (ref 0.00–0.07)
Basophils Absolute: 0 10*3/uL (ref 0.0–0.1)
Basophils Relative: 0 %
Eosinophils Absolute: 0.1 10*3/uL (ref 0.0–0.5)
Eosinophils Relative: 5 %
HCT: 29.5 % — ABNORMAL LOW (ref 39.0–52.0)
Hemoglobin: 9.7 g/dL — ABNORMAL LOW (ref 13.0–17.0)
Lymphocytes Relative: 45 %
Lymphs Abs: 0.9 10*3/uL (ref 0.7–4.0)
MCH: 36.7 pg — ABNORMAL HIGH (ref 26.0–34.0)
MCHC: 32.9 g/dL (ref 30.0–36.0)
MCV: 111.7 fL — ABNORMAL HIGH (ref 80.0–100.0)
Monocytes Absolute: 0.1 10*3/uL (ref 0.1–1.0)
Monocytes Relative: 4 %
Neutro Abs: 1 10*3/uL — ABNORMAL LOW (ref 1.7–7.7)
Neutrophils Relative %: 46 %
Platelets: 104 10*3/uL — ABNORMAL LOW (ref 150–400)
RBC: 2.64 MIL/uL — ABNORMAL LOW (ref 4.22–5.81)
RDW: 15.6 % — ABNORMAL HIGH (ref 11.5–15.5)
WBC: 2.1 10*3/uL — ABNORMAL LOW (ref 4.0–10.5)
nRBC: 0 % (ref 0.0–0.2)

## 2022-10-02 LAB — COMPREHENSIVE METABOLIC PANEL
ALT: 10 U/L (ref 0–44)
AST: 11 U/L — ABNORMAL LOW (ref 15–41)
Albumin: 3.8 g/dL (ref 3.5–5.0)
Alkaline Phosphatase: 60 U/L (ref 38–126)
Anion gap: 7 (ref 5–15)
BUN: 29 mg/dL — ABNORMAL HIGH (ref 8–23)
CO2: 25 mmol/L (ref 22–32)
Calcium: 9.1 mg/dL (ref 8.9–10.3)
Chloride: 105 mmol/L (ref 98–111)
Creatinine, Ser: 1.58 mg/dL — ABNORMAL HIGH (ref 0.61–1.24)
GFR, Estimated: 47 mL/min — ABNORMAL LOW (ref >60)
Glucose, Bld: 104 mg/dL — ABNORMAL HIGH (ref 70–99)
Potassium: 4 mmol/L (ref 3.5–5.1)
Sodium: 137 mmol/L (ref 135–145)
Total Bilirubin: 0.9 mg/dL (ref 0.3–1.2)
Total Protein: 7.3 g/dL (ref 6.5–8.1)

## 2022-10-02 LAB — MAGNESIUM: Magnesium: 2.1 mg/dL (ref 1.7–2.4)

## 2022-10-02 MED ORDER — PROCHLORPERAZINE MALEATE 10 MG PO TABS
10.0000 mg | ORAL_TABLET | Freq: Four times a day (QID) | ORAL | 1 refills | Status: DC | PRN
Start: 2022-10-02 — End: 2022-10-10
  Filled 2022-10-02: qty 30, 8d supply, fill #0

## 2022-10-02 NOTE — Progress Notes (Signed)

## 2022-10-02 NOTE — Patient Instructions (Signed)
[facility] at [facility] Discharge Instructions   You were seen and examined today by Dr. Katragadda.  He reviewed the results of your lab work which are normal/stable.   We will proceed with your treatment today.  Return as scheduled.    Thank you for choosing [facility] at [facility] to provide your oncology and hematology care.  To afford each patient quality time with our provider, please arrive at least 15 minutes before your scheduled appointment time.   If you have a lab appointment with the Cancer Center please come in thru the Main Entrance and check in at the main information desk.  You need to re-schedule your appointment should you arrive 10 or more minutes late.  We strive to give you quality time with our providers, and arriving late affects you and other patients whose appointments are after yours.  Also, if you no show three or more times for appointments you may be dismissed from the clinic at the providers discretion.     Again, thank you for choosing [facility].  Our hope is that these requests will decrease the amount of time that you wait before being seen by our physicians.       _____________________________________________________________  Should you have questions after your visit to [facility], please contact our office at [phone] and follow the prompts.  Our office hours are 8:00 a.m. and 4:30 p.m. Monday - Friday.  Please note that voicemails left after 4:00 p.m. may not be returned until the following business day.  We are closed weekends and major holidays.  You do have access to a nurse 24-7, just call the main number to the clinic [phone] and do not press any options, hold on the line and a nurse will answer the phone.    For prescription refill requests, have your pharmacy contact our office and allow 72 hours.    Due to Covid, you will need to wear a mask upon entering  the hospital. If you do not have a mask, a mask will be given to you at the Main Entrance upon arrival. For doctor visits, patients may have 1 support person age 18 or older with them. For treatment visits, patients can not have anyone with them due to social distancing guidelines and our immunocompromised population.      

## 2022-10-02 NOTE — Progress Notes (Signed)
No treatment today per A.Gilmore Laroche. We will continue with D1C1 after port placement.

## 2022-10-02 NOTE — Progress Notes (Signed)
[facility] [address] [place] [address]    Clinic Day:  10/02/2022  Referring physician: Elfredia Nevins, MD  Patient Care Team: John Nevins, MD as PCP - General (Internal Medicine) John Rotunda, MD as PCP - Cardiology (Cardiology) John Massed, MD as Medical Oncologist (Hematology)   ASSESSMENT & PLAN:   Assessment: 1.  MDS with EB 2: - Presentation with severe neutropenia, mild thrombocytopenia and anemia - BMBX (08/17/2022): MDS with excess blasts.  Myeloblasts 17% and monocytes associated dyspoietic changes. - Cytogenetics: 46, XY[10].  AML FISH panel normal. - CEBPA negative.  FLT3 ITD/TKD negative.  NPM1 negative.  T p53 negative. - IDH1 negative.  IDH 2 mutation (p.[id number]) positive. - IPSS-R: Score 5.5, high risk - Serum EPO: 186 - NGS: IDH2 mutation present   2.  Social/family history: - He is independent of ADLs and IADLs.  He served in [employer] in the 1970s.  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  Current active smoker, 1 pack/day for 50 years.  [name]. - No family history of leukopenia or malignancies.    Plan: 1.  MDS with EB 2: - We discussed NGS test results of which showed IDH 2 mutation. - Reviewed labs today: WBC 2.1, PLT-104, ANC-1.0.  Hemoglobin-9.7. - He would like to delay treatment until after Labor Day as he does not have port placed until 10/12/2022. - We also discussed treatment plan and repeat bone marrow biopsy after couple of cycles of azacitidine.  We also discussed side effects of azacitidine.  Enasidenib would be a second line option. - He will tentatively start his cycle 1 on 10/23/2022.  I will see him back on day 15 to check nadir counts.    Orders Placed This Encounter  Procedures   Comprehensive metabolic panel    Standing Status:   Future    Standing Expiration Date:   10/23/2023   CBC with Differential    Standing Status:   Future     Standing Expiration Date:   10/23/2023   CBC with Differential    Standing Status:   Future    Standing Expiration Date:   10/30/2023   Comprehensive metabolic panel    Standing Status:   Future    Standing Expiration Date:   11/20/2023   CBC with Differential    Standing Status:   Future    Standing Expiration Date:   11/20/2023   CBC with Differential    Standing Status:   Future    Standing Expiration Date:   11/27/2023   Comprehensive metabolic panel    Standing Status:   Future    Standing Expiration Date:   12/18/2023   CBC with Differential    Standing Status:   Future    Standing Expiration Date:   12/18/2023   CBC with Differential    Standing Status:   Future    Standing Expiration Date:   12/25/2023   Comprehensive metabolic panel    Standing Status:   Future    Standing Expiration Date:   01/15/2024   CBC with Differential    Standing Status:   Future    Standing Expiration Date:   01/15/2024   CBC with Differential    Standing Status:   Future    Standing Expiration Date:   01/22/2024      I,John Hicks,acting as a scribe for John Massed, MD.,have documented all relevant documentation on the  behalf of John Massed, MD,as directed by  John Massed, MD while in the presence of John Massed, MD.   I, John Massed MD, have reviewed the above documentation for accuracy and completeness, and I agree with the above.   John Massed, MD   5/13/20245:38 PM  CHIEF COMPLAINT:   Diagnosis: MDS   Cancer Staging  No matching staging information was found for the patient.   Prior Therapy: none  Current Therapy:  azacitadine   HISTORY OF PRESENT ILLNESS:   Oncology History  [name]  09/06/2022 Initial Diagnosis   MDS (myelodysplastic syndrome)   10/23/2022 -  Chemotherapy   Patient is on Treatment Plan : MYELODYSPLASIA  Azacitidine IV D1-7 q28d        INTERVAL HISTORY:   John Hicks is a 72 y.o. male  presenting to clinic today for follow up of MDS. He was last seen by me on 09/07/22.  He is scheduled for port placement on 10/12/22.  Today, he states that he is doing well overall. His appetite level is at 100%. His energy level is at 80%.  PAST MEDICAL HISTORY:   Past Medical History: Past Medical History:  Diagnosis Date   AAA (abdominal aortic aneurysm) (HCC)    AF (atrial fibrillation) (HCC)    Anxiety    BPH (benign prostatic hyperplasia)    CHF (congestive heart failure) (HCC)    Chronic kidney disease    stage 3   Depression    Dyspnea    Dysrhythmia    Hypertension    Hypothyroid    Myocardial infarction [facility])     Surgical History: Past Surgical History:  Procedure Laterality Date   ABDOMINAL AORTIC ENDOVASCULAR STENT GRAFT N/A 02/03/2019   Procedure: ABDOMINAL AORTIC ENDOVASCULAR STENT GRAFT;  Surgeon: John Harman, MD;  Location: [facility] OR;  Service: Vascular;  Laterality: N/A;   APPENDECTOMY     KNEE SURGERY     ULTRASOUND GUIDANCE FOR VASCULAR ACCESS  02/03/2019   Procedure: Ultrasound Guidance For Vascular Access;  Surgeon: John Harman, MD;  Location: [facility];  Service: Vascular;;    Social History: Social History   Socioeconomic History   Marital status: Single    Spouse name: Not on file   Number of children: Not on file   Years of education: Not on file   Highest education level: Not on file  Occupational History   Occupation: Retired  Tobacco Use   Smoking status: Every Day    Packs/day: 1    Types: Cigarettes    Passive exposure: Current   Smokeless tobacco: Never   Tobacco comments:    ppd  Vaping Use   Vaping Use: Never used  Substance and Sexual Activity   Alcohol use: No   Drug use: Yes    Types: Marijuana   Sexual activity: Not Currently  Other Topics Concern   Not on file  Social History Narrative   Pt lives w/ landlord and John Hicks.   Social Determinants of Health   Financial Resource Strain: Not on file   Food Insecurity: Food Insecurity Present (06/30/2022)   Hunger Vital Sign    Worried About Running Out of Food in the Last Year: Never true    Ran Out of Food in the Last Year: Sometimes true  Transportation Needs: No Transportation Needs (06/30/2022)   PRAPARE - Administrator, Civil Service (Medical): No    Lack of Transportation (Non-Medical): No  Physical Activity: Not on file  Stress: Not on file  Social Connections: Not on file  Intimate Partner Violence: Not At Risk (06/30/2022)   Humiliation, Afraid, Rape, and Kick questionnaire    Fear of Current or Ex-Partner: No    Emotionally Abused: No    Physically Abused: No    Sexually Abused: No    Family History: Family History  Problem Relation Age of Onset   Stroke Father 20       went in the bathroom and collapsed   Multiple sclerosis Sister    Psychiatric Illness Mother 85    Current Medications:  Current Outpatient Medications:    ALPRAZolam (XANAX) 1 MG tablet, Take 1 mg by mouth 3 (three) times daily as needed for anxiety., Disp: , Rfl:    amiodarone (PACERONE) 200 MG tablet, Take 200 mg by mouth daily at 2 PM., Disp: , Rfl: 0   amoxicillin (AMOXIL) 875 MG tablet, Take 875 mg by mouth 2 (two) times daily. X10 days, Disp: , Rfl:    aspirin 81 MG chewable tablet, Chew 81 mg by mouth daily at 2 PM., Disp: , Rfl:    azaCITIDine 5 mg/2 mLs in lactated ringers infusion, Inject into the vein every 28 (twenty-eight) days. Days 1-7 every 28 days, Disp: , Rfl:    carvedilol (COREG) 6.25 MG tablet, Take 1 tablet (6.25 mg total) by mouth 2 (two) times daily., Disp: 180 tablet, Rfl: 3   Cholecalciferol (VITAMIN D3) 50 MCG (2000 UT) capsule, Take 2,000 Units by mouth daily at 2 PM. 1400, Disp: , Rfl:    Coenzyme Q10 (COQ10) 200 MG CAPS, Take 200 mg by mouth daily at 2 PM., Disp: , Rfl:    Flaxseed, Linseed, (FLAXSEED OIL) 1000 MG CAPS, Take 1,000 mg by mouth daily at 2 PM., Disp: , Rfl:    folic acid (FOLVITE) 1 MG tablet,  Take 1 mg by mouth daily at 2 PM. One daily, Disp: , Rfl:    furosemide (LASIX) 20 MG tablet, Take 40 mg by mouth 2 (two) times daily. 1400 & bedtime, Disp: , Rfl:    levothyroxine (SYNTHROID) 25 MCG tablet, Take 25 mcg by mouth daily at 2 PM., Disp: , Rfl:    prochlorperazine (COMPAZINE) 10 MG tablet, Take 1 tablet (10 mg total) by mouth every 6 (six) hours as needed for nausea or vomiting., Disp: 30 tablet, Rfl: 1   tamsulosin (FLOMAX) 0.4 MG CAPS capsule, Take 0.4 mg by mouth daily at 2 PM., Disp: , Rfl:    Allergies: Allergies  Allergen Reactions   Codeine Other (See Comments)    agitation    REVIEW OF SYSTEMS:   Review of Systems  Constitutional:  Negative for chills, fatigue and fever.  HENT:   Negative for lump/mass, mouth sores, nosebleeds, sore throat and trouble swallowing.   Eyes:  Negative for eye problems.  Respiratory:  Negative for cough and shortness of breath.   Cardiovascular:  Negative for chest pain, leg swelling and palpitations.  Gastrointestinal:  Positive for constipation. Negative for abdominal pain, diarrhea, nausea and vomiting.  Genitourinary:  Negative for bladder incontinence, difficulty urinating, dysuria, frequency, hematuria and nocturia.   Musculoskeletal:  Negative for arthralgias, back pain, flank pain, myalgias and neck pain.  Skin:  Negative for itching and rash.  Neurological:  Positive for numbness. Negative for dizziness and headaches.  Hematological:  Does not bruise/bleed easily.  Psychiatric/Behavioral:  Positive for sleep disturbance. Negative for depression and suicidal ideas. The patient is nervous/anxious.   All other systems  reviewed and are negative.    VITALS:   Blood pressure (!) 168/86, pulse (!) 53, temperature (!) 97.3 F (36.3 C), temperature source Tympanic, resp. rate 17, height 6' (1.829 m), weight 187 lb 6.4 oz (85 kg), SpO2 100 %.  Wt Readings from Last 3 Encounters:  10/02/22 187 lb 6.4 oz (85 kg)  09/21/22 189 lb  (85.7 kg)  09/13/22 181 lb (82.1 kg)    Body mass index is 25.42 kg/m.  Performance status (ECOG): 1 - Symptomatic but completely ambulatory  PHYSICAL EXAM:   Physical Exam Vitals and nursing note reviewed. Exam conducted with a chaperone present.  Constitutional:      Appearance: Normal appearance.  Cardiovascular:     Rate and Rhythm: Normal rate and regular rhythm.     Pulses: Normal pulses.     Heart sounds: Normal heart sounds.  Pulmonary:     Effort: Pulmonary effort is normal.     Breath sounds: Normal breath sounds.  Abdominal:     Palpations: Abdomen is soft. There is no hepatomegaly, splenomegaly or mass.     Tenderness: There is no abdominal tenderness.  Musculoskeletal:     Right lower leg: No edema.     Left lower leg: No edema.  Lymphadenopathy:     Cervical: No cervical adenopathy.     Right cervical: No superficial, deep or posterior cervical adenopathy.    Left cervical: No superficial, deep or posterior cervical adenopathy.     Upper Body:     Right upper body: No supraclavicular or axillary adenopathy.     Left upper body: No supraclavicular or axillary adenopathy.  Neurological:     General: No focal deficit present.     Mental Status: He is alert and oriented to person, place, and time.  Psychiatric:        Mood and Affect: Mood normal.        Behavior: Behavior normal.     LABS:      Latest Ref Rng & Units 10/02/2022   10:38 AM 09/07/2022   11:02 AM 08/17/2022    7:33 AM  CBC  WBC 4.0 - 10.5 K/uL 2.1  1.8  1.9   Hemoglobin 13.0 - 17.0 g/dL 9.7  9.6  96.0   Hematocrit 39.0 - 52.0 % 29.5  28.9  30.4   Platelets 150 - 400 K/uL 104  100  109       Latest Ref Rng & Units 10/02/2022   10:38 AM 09/07/2022   11:02 AM 07/25/2022   10:25 AM  CMP  Glucose 70 - 99 mg/dL 454  99  94   BUN 8 - 23 mg/dL 29  22  22    Creatinine 0.61 - 1.24 mg/dL 0.98  1.19  1.47   Sodium 135 - 145 mmol/L 137  140  138   Potassium 3.5 - 5.1 mmol/L 4.0  3.7  4.0    Chloride 98 - 111 mmol/L 105  105  99   CO2 22 - 32 mmol/L 25  26  27    Calcium 8.9 - 10.3 mg/dL 9.1  9.1  9.2   Total Protein 6.5 - 8.1 g/dL 7.3  7.0    Total Bilirubin 0.3 - 1.2 mg/dL 0.9  0.8    Alkaline Phos 38 - 126 U/L 60  51    AST 15 - 41 U/L 11  13    ALT 0 - 44 U/L 10  9       No results  found for: "CEA1", "CEA" / No results found for: "CEA1", "CEA" No results found for: "PSA1" No results found for: "ZOX096" No results found for: "CAN125"  No results found for: "TOTALPROTELP", "ALBUMINELP", "A1GS", "A2GS", "BETS", "BETA2SER", "GAMS", "MSPIKE", "SPEI" Lab Results  Component Value Date   TIBC 339 06/30/2022   FERRITIN 108 06/30/2022   IRONPCTSAT 17 (L) 06/30/2022   Lab Results  Component Value Date   LDH 145 06/30/2022     STUDIES:   No results found.

## 2022-10-03 ENCOUNTER — Inpatient Hospital Stay: Payer: PPO

## 2022-10-04 ENCOUNTER — Other Ambulatory Visit: Payer: Self-pay

## 2022-10-04 ENCOUNTER — Inpatient Hospital Stay: Payer: PPO

## 2022-10-05 ENCOUNTER — Inpatient Hospital Stay: Payer: PPO

## 2022-10-06 ENCOUNTER — Inpatient Hospital Stay: Payer: PPO

## 2022-10-06 ENCOUNTER — Other Ambulatory Visit (HOSPITAL_COMMUNITY): Payer: Self-pay | Admitting: Endocrinology

## 2022-10-09 ENCOUNTER — Inpatient Hospital Stay: Payer: PPO

## 2022-10-09 ENCOUNTER — Ambulatory Visit: Payer: PPO | Attending: Nurse Practitioner | Admitting: Nurse Practitioner

## 2022-10-09 ENCOUNTER — Encounter: Payer: Self-pay | Admitting: Nurse Practitioner

## 2022-10-09 VITALS — BP 142/84 | HR 75 | Ht 72.0 in | Wt 188.6 lb

## 2022-10-09 DIAGNOSIS — I351 Nonrheumatic aortic (valve) insufficiency: Secondary | ICD-10-CM | POA: Diagnosis not present

## 2022-10-09 DIAGNOSIS — I5022 Chronic systolic (congestive) heart failure: Secondary | ICD-10-CM

## 2022-10-09 DIAGNOSIS — I7121 Aneurysm of the ascending aorta, without rupture: Secondary | ICD-10-CM

## 2022-10-09 DIAGNOSIS — D469 Myelodysplastic syndrome, unspecified: Secondary | ICD-10-CM | POA: Diagnosis not present

## 2022-10-09 DIAGNOSIS — I7143 Infrarenal abdominal aortic aneurysm, without rupture: Secondary | ICD-10-CM

## 2022-10-09 DIAGNOSIS — I10 Essential (primary) hypertension: Secondary | ICD-10-CM

## 2022-10-09 DIAGNOSIS — I77810 Thoracic aortic ectasia: Secondary | ICD-10-CM | POA: Diagnosis not present

## 2022-10-09 DIAGNOSIS — I5020 Unspecified systolic (congestive) heart failure: Secondary | ICD-10-CM

## 2022-10-09 DIAGNOSIS — I480 Paroxysmal atrial fibrillation: Secondary | ICD-10-CM | POA: Diagnosis not present

## 2022-10-09 NOTE — Progress Notes (Unsigned)
Office Visit    Patient Name: John Hicks Date of Encounter: 10/09/2022  PCP:  Elfredia Nevins, MD   [facility]  Cardiologist:  Rollene Rotunda, MD  Advanced Practice Provider:  No care team member to display Electrophysiologist:  None   Chief Complaint    John Hicks is a 72 y.o. male with a hx of atrial fibrillation, aortic root dilatation, AAA, s/p endovascular stenting in 2020 and infrarenal aortic aneurysm repair by Dr. Randie Heinz, history of HFrecEF, mitral valve disease, valvular insufficiency, myelodysplastic syndrome (following oncology), who presents today for CHF follow-up.  Past Medical History    Past Medical History:  Diagnosis Date   AAA (abdominal aortic aneurysm) (HCC)    AF (atrial fibrillation) (HCC)    Anxiety    BPH (benign prostatic hyperplasia)    CHF (congestive heart failure) (HCC)    Chronic kidney disease    stage 3   Depression    Dyspnea    Dysrhythmia    Hypertension    Hypothyroid    Myocardial infarction [facility])    Past Surgical History:  Procedure Laterality Date   ABDOMINAL AORTIC ENDOVASCULAR STENT GRAFT N/A 02/03/2019   Procedure: ABDOMINAL AORTIC ENDOVASCULAR STENT GRAFT;  Surgeon: Maeola Harman, MD;  Location: [facility] OR;  Service: Vascular;  Laterality: N/A;   APPENDECTOMY     KNEE SURGERY     ULTRASOUND GUIDANCE FOR VASCULAR ACCESS  02/03/2019   Procedure: Ultrasound Guidance For Vascular Access;  Surgeon: Maeola Harman, MD;  Location: [facility] OR;  Service: Vascular;;    Allergies  Allergies  Allergen Reactions   Codeine Other (See Comments)    agitation    History of Present Illness    John Hicks is a 72 y.o. male with a PMH as mentioned above.  Last seen by Dr. Antoine Poche on September 13, 2022.  He had started treatment for his newly diagnosed myelodysplastic syndrome.  It was also noted he had a tooth abscess at the time that was bothering him.  Overall he was doing well from a  cardiac perspective.  He remained in sinus rhythm, was not felt to be a good candidate for anticoagulation for history of PAF.  Dr. Antoine Poche reviewed his CT scan earlier April 2024 that revealed 41 mm aortic root dilatation, this appeared to be stable.  Today he presents for follow-up.  He states he is doing well.  Stating he is wanting to switch providers as he is from [place], [place].  Wants to be seen locally. Denies any chest pain, shortness of breath, palpitations, syncope, presyncope, dizziness, orthopnea, PND, swelling or significant weight changes, acute bleeding, or claudication.  Does admit to whitecoat hypertension, blood pressure does drop occasionally.  Says he will be getting a Port-A-Cath placed this upcoming week to start chemo treatments.   EKGs/Labs/Other Studies Reviewed:   The following studies were reviewed today: ***  EKG:  EKG is *** ordered today.  The ekg ordered today demonstrates ***  Recent Labs: 10/02/2022: ALT 10; BUN 29; Creatinine, Ser 1.58; Hemoglobin 9.7; Magnesium 2.1; Platelets 104; Potassium 4.0; Sodium 137  Recent Lipid Panel No results found for: "CHOL", "TRIG", "HDL", "CHOLHDL", "VLDL", "LDLCALC", "LDLDIRECT"  Risk Assessment/Calculations:  {Does this patient have ATRIAL FIBRILLATION?:[phone]}  Home Medications   No outpatient medications have been marked as taking for the 10/09/22 encounter (Appointment) with Sharlene Dory, NP.     Review of Systems   ***   All other systems reviewed  and are otherwise negative except as noted above.  Physical Exam    VS:  There were no vitals taken for this visit. , BMI There is no height or weight on file to calculate BMI.  Wt Readings from Last 3 Encounters:  10/02/22 187 lb 6.4 oz (85 kg)  09/21/22 189 lb (85.7 kg)  09/13/22 181 lb (82.1 kg)     GEN: Well nourished, well developed, in no acute distress. HEENT: normal. Neck: Supple, no JVD, carotid bruits, or masses. Cardiac: ***RRR, no  murmurs, rubs, or gallops. No clubbing, cyanosis, edema.  ***Radials/PT 2+ and equal bilaterally.  Respiratory:  ***Respirations regular and unlabored, clear to auscultation bilaterally. GI: Soft, nontender, nondistended. MS: No deformity or atrophy. Skin: Warm and dry, no rash. Neuro:  Strength and sensation are intact. Psych: Normal affect.  Assessment & Plan    ***  {Are you ordering a CV Procedure (e.g. stress test, cath, DCCV, TEE, etc)?   Press F2        :[id number]}      Disposition: Follow up {follow up:15908} with Rollene Rotunda, MD or APP.  Signed, Sharlene Dory, NP 10/09/2022, 12:59 PM [facility]

## 2022-10-09 NOTE — Patient Instructions (Signed)
Medication Instructions:  Continue all current medications.  Labwork: none  Testing/Procedures: none  Follow-Up: 3 months   Any Other Special Instructions Will Be Listed Below (If Applicable).  If you need a refill on your cardiac medications before your next appointment, please call your pharmacy.  

## 2022-10-10 ENCOUNTER — Inpatient Hospital Stay: Payer: PPO

## 2022-10-10 ENCOUNTER — Other Ambulatory Visit (HOSPITAL_COMMUNITY): Payer: Self-pay

## 2022-10-10 ENCOUNTER — Other Ambulatory Visit: Payer: Self-pay | Admitting: *Deleted

## 2022-10-10 ENCOUNTER — Encounter: Payer: Self-pay | Admitting: *Deleted

## 2022-10-10 ENCOUNTER — Other Ambulatory Visit: Payer: Self-pay

## 2022-10-10 ENCOUNTER — Encounter (HOSPITAL_COMMUNITY): Payer: PPO | Attending: General Surgery

## 2022-10-10 DIAGNOSIS — D469 Myelodysplastic syndrome, unspecified: Secondary | ICD-10-CM

## 2022-10-10 MED ORDER — LIDOCAINE-PRILOCAINE 2.5-2.5 % EX CREA: 1.0000 | TOPICAL_CREAM | CUTANEOUS | 6 refills | Status: DC | PRN

## 2022-10-10 MED ORDER — PROCHLORPERAZINE MALEATE 10 MG PO TABS
10.0000 mg | ORAL_TABLET | Freq: Four times a day (QID) | ORAL | 1 refills | Status: DC | PRN
Start: 2022-10-10 — End: 2022-11-29

## 2022-10-10 NOTE — Progress Notes (Signed)
Lidocaine-Prilocaine 2.5 % cream sent to plan for review and was approved through 01/08/23.

## 2022-10-12 ENCOUNTER — Encounter: Payer: Self-pay | Admitting: Hematology

## 2022-10-12 ENCOUNTER — Telehealth: Payer: Self-pay | Admitting: *Deleted

## 2022-10-12 ENCOUNTER — Ambulatory Visit (HOSPITAL_COMMUNITY): Payer: PPO | Admitting: Anesthesiology

## 2022-10-12 ENCOUNTER — Other Ambulatory Visit: Payer: Self-pay

## 2022-10-12 ENCOUNTER — Ambulatory Visit (HOSPITAL_COMMUNITY): Payer: PPO

## 2022-10-12 ENCOUNTER — Encounter (HOSPITAL_COMMUNITY): Payer: Self-pay | Admitting: General Surgery

## 2022-10-12 ENCOUNTER — Ambulatory Visit (HOSPITAL_COMMUNITY)
Admission: RE | Admit: 2022-10-12 | Discharge: 2022-10-12 | Disposition: A | Discharge status: Home / Self Care | Payer: PPO | Attending: General Surgery | Admitting: General Surgery

## 2022-10-12 ENCOUNTER — Ambulatory Visit (HOSPITAL_BASED_OUTPATIENT_CLINIC_OR_DEPARTMENT_OTHER): Payer: PPO | Admitting: Anesthesiology

## 2022-10-12 ENCOUNTER — Encounter (HOSPITAL_COMMUNITY)
Admission: RE | Disposition: A | Discharge status: Home / Self Care | Payer: Self-pay | Source: Home / Self Care | Attending: General Surgery

## 2022-10-12 DIAGNOSIS — I7121 Aneurysm of the ascending aorta, without rupture: Secondary | ICD-10-CM | POA: Diagnosis not present

## 2022-10-12 DIAGNOSIS — F17210 Nicotine dependence, cigarettes, uncomplicated: Secondary | ICD-10-CM | POA: Diagnosis not present

## 2022-10-12 DIAGNOSIS — I252 Old myocardial infarction: Secondary | ICD-10-CM | POA: Diagnosis not present

## 2022-10-12 DIAGNOSIS — D469 Myelodysplastic syndrome, unspecified: Secondary | ICD-10-CM

## 2022-10-12 DIAGNOSIS — I110 Hypertensive heart disease with heart failure: Secondary | ICD-10-CM

## 2022-10-12 DIAGNOSIS — J439 Emphysema, unspecified: Secondary | ICD-10-CM | POA: Diagnosis not present

## 2022-10-12 DIAGNOSIS — Z79899 Other long term (current) drug therapy: Secondary | ICD-10-CM | POA: Diagnosis not present

## 2022-10-12 DIAGNOSIS — I351 Nonrheumatic aortic (valve) insufficiency: Secondary | ICD-10-CM | POA: Diagnosis not present

## 2022-10-12 DIAGNOSIS — Z452 Encounter for adjustment and management of vascular access device: Secondary | ICD-10-CM | POA: Diagnosis not present

## 2022-10-12 DIAGNOSIS — F419 Anxiety disorder, unspecified: Secondary | ICD-10-CM | POA: Diagnosis not present

## 2022-10-12 DIAGNOSIS — I700 Atherosclerosis of aorta: Secondary | ICD-10-CM | POA: Insufficient documentation

## 2022-10-12 DIAGNOSIS — N183 Chronic kidney disease, stage 3 unspecified: Secondary | ICD-10-CM | POA: Diagnosis not present

## 2022-10-12 DIAGNOSIS — I739 Peripheral vascular disease, unspecified: Secondary | ICD-10-CM | POA: Diagnosis not present

## 2022-10-12 DIAGNOSIS — I130 Hypertensive heart and chronic kidney disease with heart failure and stage 1 through stage 4 chronic kidney disease, or unspecified chronic kidney disease: Secondary | ICD-10-CM | POA: Insufficient documentation

## 2022-10-12 DIAGNOSIS — I509 Heart failure, unspecified: Secondary | ICD-10-CM

## 2022-10-12 DIAGNOSIS — I4891 Unspecified atrial fibrillation: Secondary | ICD-10-CM | POA: Insufficient documentation

## 2022-10-12 DIAGNOSIS — E039 Hypothyroidism, unspecified: Secondary | ICD-10-CM | POA: Diagnosis not present

## 2022-10-12 DIAGNOSIS — I712 Thoracic aortic aneurysm, without rupture, unspecified: Secondary | ICD-10-CM | POA: Diagnosis not present

## 2022-10-12 HISTORY — PX: PORTACATH PLACEMENT: SHX2246

## 2022-10-12 SURGERY — INSERTION PORT-A-CATH
Anesthesia: General | Site: Chest | Laterality: Right

## 2022-10-12 MED ORDER — HEPARIN SOD (PORK) LOCK FLUSH 100 UNIT/ML IV SOLN
INTRAVENOUS | Status: DC | PRN
  Administered 2022-10-12: 500 [IU] via INTRAVENOUS

## 2022-10-12 MED ORDER — CHLORHEXIDINE GLUCONATE CLOTH 2 % EX PADS: 6.0000 | MEDICATED_PAD | Freq: Once | CUTANEOUS | Status: DC

## 2022-10-12 MED ORDER — DEXMEDETOMIDINE HCL IN NACL 80 MCG/20ML IV SOLN
INTRAVENOUS | Status: AC
  Filled 2022-10-12: qty 20

## 2022-10-12 MED ORDER — PROPOFOL 500 MG/50ML IV EMUL
INTRAVENOUS | Status: AC
  Filled 2022-10-12: qty 50

## 2022-10-12 MED ORDER — MIDAZOLAM HCL 2 MG/2ML IJ SOLN
INTRAMUSCULAR | Status: AC
  Filled 2022-10-12: qty 2

## 2022-10-12 MED ORDER — CHLORHEXIDINE GLUCONATE 0.12 % MT SOLN
15.0000 mL | Freq: Once | OROMUCOSAL | Status: AC
  Administered 2022-10-12: 15 mL via OROMUCOSAL

## 2022-10-12 MED ORDER — LIDOCAINE HCL (PF) 1 % IJ SOLN
INTRAMUSCULAR | Status: DC | PRN
  Administered 2022-10-12: 20 mL

## 2022-10-12 MED ORDER — LACTATED RINGERS IV SOLN
INTRAVENOUS | Status: DC
  Administered 2022-10-12: 1000 mL via INTRAVENOUS

## 2022-10-12 MED ORDER — CEFAZOLIN SODIUM-DEXTROSE 2-4 GM/100ML-% IV SOLN
2.0000 g | INTRAVENOUS | Status: AC
  Administered 2022-10-12: 2 g via INTRAVENOUS
  Filled 2022-10-12: qty 100

## 2022-10-12 MED ORDER — PROPOFOL 10 MG/ML IV BOLUS
INTRAVENOUS | Status: AC
  Filled 2022-10-12: qty 20

## 2022-10-12 MED ORDER — MIDAZOLAM HCL 2 MG/2ML IJ SOLN
INTRAMUSCULAR | Status: DC | PRN
  Administered 2022-10-12 (×2): 1 mg via INTRAVENOUS

## 2022-10-12 MED ORDER — PROPOFOL 500 MG/50ML IV EMUL
INTRAVENOUS | Status: DC | PRN
  Administered 2022-10-12: 65 ug/kg/min via INTRAVENOUS

## 2022-10-12 MED ORDER — LIDOCAINE HCL (PF) 2 % IJ SOLN
INTRAMUSCULAR | Status: AC
  Filled 2022-10-12: qty 5

## 2022-10-12 MED ORDER — PROPOFOL 10 MG/ML IV BOLUS
INTRAVENOUS | Status: DC | PRN
  Administered 2022-10-12: 30 mg via INTRAVENOUS

## 2022-10-12 MED ORDER — ORAL CARE MOUTH RINSE: 15.0000 mL | Freq: Once | OROMUCOSAL | Status: AC

## 2022-10-12 MED ORDER — SODIUM CHLORIDE (PF) 0.9 % IJ SOLN
INTRAMUSCULAR | Status: DC | PRN
  Administered 2022-10-12: 500 mL via INTRAVENOUS

## 2022-10-12 MED ORDER — LIDOCAINE HCL (PF) 1 % IJ SOLN
INTRAMUSCULAR | Status: AC
  Filled 2022-10-12: qty 30

## 2022-10-12 MED ORDER — ONDANSETRON HCL 4 MG PO TABS: 4.0000 mg | ORAL_TABLET | Freq: Three times a day (TID) | ORAL | 1 refills | Status: DC | PRN

## 2022-10-12 MED ORDER — HYDROCODONE-ACETAMINOPHEN 5-325 MG PO TABS: 1.0000 | ORAL_TABLET | Freq: Four times a day (QID) | ORAL | 0 refills | Status: DC | PRN

## 2022-10-12 MED ORDER — FENTANYL CITRATE (PF) 100 MCG/2ML IJ SOLN
INTRAMUSCULAR | Status: AC
  Filled 2022-10-12: qty 2

## 2022-10-12 MED ORDER — CHLORHEXIDINE GLUCONATE 0.12 % MT SOLN
OROMUCOSAL | Status: AC
  Filled 2022-10-12: qty 15

## 2022-10-12 MED ORDER — HEPARIN SOD (PORK) LOCK FLUSH 100 UNIT/ML IV SOLN
INTRAVENOUS | Status: AC
  Filled 2022-10-12: qty 5

## 2022-10-12 SURGICAL SUPPLY — 40 items
ADH SKN CLS APL DERMABOND .7 (GAUZE/BANDAGES/DRESSINGS) ×2
APL PRP STRL LF ISPRP CHG 10.5 (MISCELLANEOUS) ×2
APPLICATOR CHLORAPREP 10.5 ORG (MISCELLANEOUS) ×2
APPLIER CLIP 9.375 SM OPEN (CLIP)
APR CLP SM 9.3 20 MLT OPN (CLIP)
BAG DECANTER FOR FLEXI CONT (MISCELLANEOUS) ×1
CLOTH BEACON ORANGE TIMEOUT ST (SAFETY) ×1
COVER LIGHT HANDLE STERIS (MISCELLANEOUS) ×2
COVER PROBE U/S 5X48 (MISCELLANEOUS) ×1
DECANTER SPIKE VIAL GLASS SM (MISCELLANEOUS) ×1
DERMABOND ADVANCED .7 DNX12 (GAUZE/BANDAGES/DRESSINGS) ×2
DRAPE C-ARM FOLDED MOBILE STRL (DRAPES) ×1
DRAPE CHEST BREAST 15X10 FENES (DRAPES) ×1
DRAPE HALF SHEET 40X57 (DRAPES) ×1
DRSG TEGADERM 2-3/8X2-3/4 SM (GAUZE/BANDAGES/DRESSINGS) ×4
ELECT REM PT RETURN 9FT ADLT (ELECTROSURGICAL) ×1
GAUZE 4X4 16PLY ~~LOC~~+RFID DBL (SPONGE) ×1
GAUZE SPONGE 2X2 STRL 8-PLY (GAUZE/BANDAGES/DRESSINGS) ×2
GLOVE BIO SURGEON STRL SZ 6.5 (GLOVE) ×1
GLOVE BIO SURGEON STRL SZ7 (GLOVE) ×1
GLOVE BIOGEL PI IND STRL 6.5 (GLOVE) ×1
GLOVE BIOGEL PI IND STRL 7.0 (GLOVE) ×2
GOWN STRL REUS W/TWL LRG LVL3 (GOWN DISPOSABLE) ×2
IV NS 500ML (IV SOLUTION) ×1
IV NS 500ML BAXH (IV SOLUTION) ×1
KIT PORT POWER 8FR ISP MRI (Port) ×1 IMPLANT
KIT TURNOVER KIT A (KITS) ×1
MANIFOLD NEPTUNE II (INSTRUMENTS) ×1
NDL HYPO 25X1 1.5 SAFETY (NEEDLE) ×1 IMPLANT
NEEDLE HYPO 25X1 1.5 SAFETY (NEEDLE) ×1
PACK MINOR (CUSTOM PROCEDURE TRAY) ×1
PAD ARMBOARD 7.5X6 YLW CONV (MISCELLANEOUS) ×1
SET BASIN LINEN APH (SET/KITS/TRAYS/PACK) ×1
SUT MNCRL AB 4-0 PS2 18 (SUTURE) ×2
SUT PROLENE 2 0 SH 30 (SUTURE) ×2
SUT VIC AB 3-0 SH 27 (SUTURE) ×2
SUT VIC AB 3-0 SH 27X BRD (SUTURE) ×2
SYR 10ML LL (SYRINGE) ×2
SYR CONTROL 10ML LL (SYRINGE) ×1
TOWEL OR 17X26 4PK STRL BLUE (TOWEL DISPOSABLE) ×1

## 2022-10-12 NOTE — Telephone Encounter (Signed)
Received fax requesting prior authorization for Zofran.  PA submitted via CoverMyMeds.  Dx: D46.9- MDS (myelodysplastic syndrome)  Your PA has been faxed to the plan as a paper copy. Please contact the plan directly if you haven't received a determination in a typical timeframe.  You will be notified of the determination via fax.

## 2022-10-12 NOTE — Anesthesia Postprocedure Evaluation (Signed)
Anesthesia Post Note  Patient: John Hicks  Procedure(s) Performed: INSERTION PORT-A-CATH (Right: Chest)  Patient location during evaluation: Phase II Anesthesia Type: General Level of consciousness: awake and alert and oriented Pain management: pain level controlled Vital Signs Assessment: post-procedure vital signs reviewed and stable Respiratory status: spontaneous breathing, nonlabored ventilation and respiratory function stable Cardiovascular status: blood pressure returned to baseline and stable Postop Assessment: no apparent nausea or vomiting Anesthetic complications: no  No notable events documented.   Last Vitals:  Vitals:   10/12/22 1000 10/12/22 1008  BP: (!) 110/58 (!) 108/90  Pulse: (!) 45 (!) 47  Resp: (!) 9 13  Temp:  36.4 C  SpO2: 100% 96%    Last Pain:  Vitals:   10/12/22 1008  TempSrc:   PainSc: 0-No pain                 John Hicks

## 2022-10-12 NOTE — Interval H&P Note (Signed)
History and Physical Interval Note:  10/12/2022 7:19 AM  John Hicks  has presented today for surgery, with the diagnosis of Myelodysplastic syndrome.  The various methods of treatment have been discussed with the patient and family. After consideration of risks, benefits and other options for treatment, the patient has consented to  Procedure(s): INSERTION PORT-A-CATH (Right) as a surgical intervention.  The patient's history has been reviewed, patient examined, no change in status, stable for surgery.  I have reviewed the patient's chart and labs.  Questions were answered to the patient's satisfaction.    Patient decided he wants his port on the left side now. He was worried about the seatbelt but now has thought about it more and wants to have his right hand more free.  Lucretia Roers

## 2022-10-12 NOTE — Progress Notes (Addendum)
[facility]  Challenging placement of port due to some mid upper chest anatomy/scarring that would not allow the wire to pass. Started on left and finally obtained catheter on the right internal jugular.   Patient only wants his neighbor notified when he is done and ready to be picked up. He did not want me to call any family to let them know we were done.   CXR ordered.   Team reiterated to patient that his neighbor or someone needs to stay with him tonight and he says he will get them too.   Algis Greenhouse, MD [facility] [address] [place], [place] [address] [phone] (office)

## 2022-10-12 NOTE — Progress Notes (Signed)
[facility]  CXR with port in good position, no PTX.  Algis Greenhouse, MD [facility] [address] [place], [place] [address] [phone] (office)

## 2022-10-12 NOTE — Op Note (Signed)
Operative Note 10/12/22   Preoperative Diagnosis: Myelodysplastic syndrome    Postoperative Diagnosis: Same   Procedure(s) Performed: Port-A-Cath placement, right internal jugular  (attempted and aborted left subclavian, left internal jugular, and right subclavian due to inability to thread wire passed upper mid chest)   Surgeon: Leatrice Jewels. Henreitta Leber, MD   Assistants: No qualified resident was available   Anesthesia: Monitored anesthesia care   Anesthesiologist: Molli Barrows, MD    Specimens: None   Estimated Blood Loss: Minimal   Fluoroscopy time: 51 seconds   Blood Replacement: None    Complications: None    Operative Findings: left subclavian accessed and wire threaded but dilator would not thread completely, left internal jugular small and unable to access with flash of blood; right subclavian accessed and wire curling up in the upper mid chest area, right internal jugular normal in size and wire/ dilator/ catheter passed without issue   Indications: John Hicks is a 72 yo with myelodysplastic disorder who needs a port placed for treatment. We discussed placement and discussed risk of bleeding, infection, injury to vessels, pneumothorax, malfunctioning catheter. We discussed which side to place and initially he wanted it on the right side due to the seat belt with driving, but then asked for it to be on the left side. Discussed this and will start on the left but will move to the right if unable to place on the left.   Procedure: The patient was brought into the operating room and monitored anesthesia care was induced.  One percent lidocaine was used for local anesthesia.   The left chest and neck was prepped and draped in the usual sterile fashion.  Preoperative antibiotics were given.  An incision was made below the left clavicle. A subcutaneous pocket was formed. The needles advanced into the left subclavian vein using the Seldinger technique without difficulty. A guidewire  was then advanced into the right atrium under fluoroscopic guidance.  Ectopia was not noted. An introducer and peel-away sheath were placed over the guidewire but I could not get the introducer to pass completely. We used fluoroscopy and I did not want force any dilator down. I threaded the catheter and the wire back but could not get passed a point in the upper mid chest.  I aborted this and got a new port a catheter kit. I attempted another subclavian stick but again could not get the wire now to pass the point where the catheter was stopping. I then went to the left internal jugular which was open but small. I could never get flash back into my syringe initially. I then tried to go lower on the jugular and did get flash back and tried to thread the wire, which again did not pass the point where I initially had issues passing the catheter.    We then closed the left chest incision with interrupted 3-0 Vicryl and 4-0 Monocryl and dermabond. The internal jugular sticks were covered with gauze and tegaderm.   We re-prepped and draped the right chest and neck. An incision was made below the right clavicle. A subcutaneous pocket was formed. The needles advanced into the right subclavian vein using the Seldinger technique without difficulty. A guidewire was then advanced but would not pass an area in the mid upper chest again.  I am not sure if he has an anatomical variant or if he is scarred or thrombosed in this area.    I then opted to go for the right internal jugular. The  vein was appropriately sized compared to the left side. I got into the vein with a flash of blood and advanced the wire into the right atrium under fluoroscopic guidance.  Ectopia was not noted. A small incision was made. An introducer and peel-away sheath were placed over the guidewire.  The catheter was tunneled to the neck incision from the chest incision. The catheter was then inserted through the peel-away sheath and the peel-away  sheath was removed.  A spot film was performed to confirm the position. The catheter was then attached to the port and the port placed in subcutaneous pocket. Adequate positioning was confirmed by fluoroscopy. Hemostasis was confirmed, and the port was secured with 2-0 prolene sutures.  Good backflow of blood was noted on aspiration of the port. The port was flushed with heparin flush. Subcutaneous layer was reapproximated using a 3-0 Vicryl interrupted suture. The skin was closed using a 4-0 Vicryl subcuticular suture. The neck incision was closed with 4-0 Monocryl. Dermabond was applied.     All tape and needle counts were correct at the end of the procedure. The patient was transferred to PACU in stable condition. A chest x-ray will be performed at that time.  John Greenhouse, MD [facility] [address] [place], [place] [address] [phone] (office)

## 2022-10-12 NOTE — Anesthesia Preprocedure Evaluation (Addendum)
Anesthesia Evaluation  Patient identified by MRN, date of birth, ID band Patient awake    Reviewed: Allergy & Precautions, H&P , NPO status , Patient's Chart, lab work & pertinent test results, reviewed documented beta blocker date and time   Airway Mallampati: II  TM Distance: >3 FB Neck ROM: Full    Dental  (+) Missing, Poor Dentition, Dental Advisory Given   Pulmonary shortness of breath, Current Smoker and Patient abstained from smoking.   Pulmonary exam normal breath sounds clear to auscultation       Cardiovascular Exercise Tolerance: Poor hypertension, Pt. on medications and Pt. on home beta blockers + Past MI, + Peripheral Vascular Disease (Thoracic aortic aneurysm without rupture (HCC) and +CHF  Normal cardiovascular exam+ dysrhythmias  Rhythm:Regular Rate:Normal  Echo 06/2022: 1. Left ventricular ejection fraction, by estimation, is 45 to 50%. The  left ventricle has mildly decreased function. The left ventricle  demonstrates global hypokinesis. There is moderate left ventricular  hypertrophy. Left ventricular diastolic  parameters are consistent with Grade I diastolic dysfunction (impaired  relaxation).   2. Right ventricular systolic function is normal. The right ventricular  size is normal.   3. Left atrial size was mild to moderately dilated.   4. The mitral valve is normal in structure. No evidence of mitral valve  regurgitation. No evidence of mitral stenosis.   5. The aortic valve is tricuspid. Aortic valve regurgitation is moderate.  No aortic stenosis is present.   6. Aortic dilatation noted. There is severe dilatation of the aortic  root, measuring 55 mm. There is severe dilatation of the ascending aorta,  measuring 58 mm.  IMPRESSION: 1. Lung-RADS 1, negative. Continue annual screening with low-dose chest CT without contrast in 12 months. 2. Mild aneurysmal dilatation of the ascending thoracic  aorta measuring 4.1 cm. Recommend annual imaging followup by CTA or MRA. This recommendation follows 2010 ACCF/AHA/AATS/ACR/ASA/SCA/SCAI/SIR/STS/SVM Guidelines for the Diagnosis and Management of Patients with Thoracic Aortic Disease. Circulation. 2010; 121: W098-J191. Aortic aneurysm NOS (ICD10-I71.9) 3. Coronary artery calcifications. 4. Aortic Atherosclerosis (ICD10-I70.0) and Emphysema (ICD10-J43.9).     Electronically Signed   By: Signa Kell M.D.   On: 08/24/2022 13:02       Neuro/Psych  PSYCHIATRIC DISORDERS Anxiety Depression    negative neurological ROS     GI/Hepatic negative GI ROS, Neg liver ROS,,,  Endo/Other  Hypothyroidism    Renal/GU Renal InsufficiencyRenal disease  negative genitourinary   Musculoskeletal negative musculoskeletal ROS (+)    Abdominal   Peds negative pediatric ROS (+)  Hematology  (+) Blood dyscrasia (MYELODYSPLASTIC SYNDROME)   Anesthesia Other Findings John Hicks is a 72 y.o. male who presents for follow up of atrial fib.  He has had endovascular stenting to an AAA in Sept 2020.  He has history of biventricular HF with EF 10 -15% in 2018.  However, EF was was 65% in 2020 with severe LVH.    He had a follow up MRI in Nov.  This demonstrated the EF to be 44%.  The aorta was 55mm.  Hte had moderate to severe MR.  There is LEG suggestive of HCM.    CT demonstrated that his aortic root was 6.3 cm.    Echo in Feb demonstrated severe aortic root dilatation.  AI is moderate.   Most recent 41 mm.  Ejection fraction is slightly reduced at 45 -50%.  He previously missed appointments with surgery.  Trouble getting married.  He did eventually receive.  He has severe  RV dysfunction/normal pulmonary pressures.  Overall he is slightly very high risk for surgery with severe RV dysfunction, renal insufficiency ongoing tobacco use.  Reproductive/Obstetrics negative OB ROS                             Anesthesia  Physical Anesthesia Plan  ASA: 4  Anesthesia Plan: General   Post-op Pain Management: Minimal or no pain anticipated and Dilaudid IV   Induction: Intravenous  PONV Risk Score and Plan: 2 and Ondansetron and Dexamethasone  Airway Management Planned: Nasal Cannula, Natural Airway and Simple Face Mask  Additional Equipment:   Intra-op Plan:   Post-operative Plan:   Informed Consent: I have reviewed the patients History and Physical, chart, labs and discussed the procedure including the risks, benefits and alternatives for the proposed anesthesia with the patient or authorized representative who has indicated his/her understanding and acceptance.     Dental advisory given  Plan Discussed with: CRNA and Surgeon  Anesthesia Plan Comments:        Anesthesia Quick Evaluation

## 2022-10-12 NOTE — Transfer of Care (Signed)
Immediate Anesthesia Transfer of Care Note  Patient: John Hicks  Procedure(s) Performed: INSERTION PORT-A-CATH (Right: Chest)  Patient Location: PACU  Anesthesia Type:General  Level of Consciousness: drowsy and patient cooperative  Airway & Oxygen Therapy: Patient Spontanous Breathing and Patient connected to nasal cannula oxygen  Post-op Assessment: Report given to RN and Post -op Vital signs reviewed and stable  Post vital signs: Reviewed and stable  Last Vitals:  Vitals Value Taken Time  BP    Temp    Pulse 51 10/12/22 0924  Resp 10 10/12/22 0924  SpO2 94 % 10/12/22 0924  Vitals shown include unvalidated device data.  Last Pain:  Vitals:   10/12/22 0656  TempSrc: Oral  PainSc: 0-No pain      Patients Stated Pain Goal: 8 (10/12/22 0656)  Complications: No notable events documented.

## 2022-10-12 NOTE — Discharge Instructions (Signed)
Keep area clean and dry. You can removed the bandages in 48 hours but do not pick at the glue.  You can take a shower in 48 hours. Until then do birdbaths and do not get the chest or neck wet. Do not submerge in water. After 48 hours you can shower per your normal routine.  Take tylenol and ibuprofen for pain control and Norco for severe pain.

## 2022-10-17 NOTE — Telephone Encounter (Signed)
Received PA determination.   PA denied.   You request was denied because it is being used for an indication which is not approved or medically accepted: post operative nausea/ vomiting.

## 2022-10-19 ENCOUNTER — Encounter (HOSPITAL_COMMUNITY): Payer: Self-pay | Admitting: General Surgery

## 2022-10-20 ENCOUNTER — Other Ambulatory Visit: Payer: Self-pay

## 2022-10-20 DIAGNOSIS — D469 Myelodysplastic syndrome, unspecified: Secondary | ICD-10-CM

## 2022-10-20 NOTE — Progress Notes (Signed)
Pharmacist Chemotherapy Monitoring - Initial Assessment    Anticipated start date: 10/23/22   The following has been reviewed per standard work regarding the patient's treatment regimen: The patient's diagnosis, treatment plan and drug doses, and organ/hematologic function Lab orders and baseline tests specific to treatment regimen  The treatment plan start date, drug sequencing, and pre-medications Prior authorization status  Patient's documented medication list, including drug-drug interaction screen and prescriptions for anti-emetics and supportive care specific to the treatment regimen The drug concentrations, fluid compatibility, administration routes, and timing of the medications to be used The patient's access for treatment and lifetime cumulative dose history, if applicable  The patient's medication allergies and previous infusion related reactions, if applicable   Changes made to treatment plan:  N/A  Follow up needed:  N/A   Stephens Shire, [facility], 10/20/2022  9:43 AM

## 2022-10-23 ENCOUNTER — Inpatient Hospital Stay: Payer: PPO | Attending: Hematology

## 2022-10-23 ENCOUNTER — Inpatient Hospital Stay: Payer: PPO

## 2022-10-23 VITALS — BP 148/66 | HR 55 | Temp 96.8°F | Resp 18

## 2022-10-23 VITALS — BP 145/75 | HR 69 | Temp 98.5°F | Resp 18 | Wt 186.0 lb

## 2022-10-23 DIAGNOSIS — Z95828 Presence of other vascular implants and grafts: Secondary | ICD-10-CM

## 2022-10-23 DIAGNOSIS — D469 Myelodysplastic syndrome, unspecified: Secondary | ICD-10-CM

## 2022-10-23 DIAGNOSIS — Z5111 Encounter for antineoplastic chemotherapy: Secondary | ICD-10-CM | POA: Insufficient documentation

## 2022-10-23 DIAGNOSIS — D4622 Refractory anemia with excess of blasts 2: Secondary | ICD-10-CM | POA: Diagnosis not present

## 2022-10-23 LAB — COMPREHENSIVE METABOLIC PANEL
ALT: 8 U/L (ref 0–44)
AST: 12 U/L — ABNORMAL LOW (ref 15–41)
Albumin: 3.6 g/dL (ref 3.5–5.0)
Alkaline Phosphatase: 54 U/L (ref 38–126)
Anion gap: 8 (ref 5–15)
BUN: 35 mg/dL — ABNORMAL HIGH (ref 8–23)
CO2: 24 mmol/L (ref 22–32)
Calcium: 9.1 mg/dL (ref 8.9–10.3)
Chloride: 104 mmol/L (ref 98–111)
Creatinine, Ser: 1.75 mg/dL — ABNORMAL HIGH (ref 0.61–1.24)
GFR, Estimated: 41 mL/min — ABNORMAL LOW (ref >60)
Glucose, Bld: 97 mg/dL (ref 70–99)
Potassium: 4 mmol/L (ref 3.5–5.1)
Sodium: 136 mmol/L (ref 135–145)
Total Bilirubin: 0.8 mg/dL (ref 0.3–1.2)
Total Protein: 7 g/dL (ref 6.5–8.1)

## 2022-10-23 LAB — CBC WITH DIFFERENTIAL/PLATELET
Abs Immature Granulocytes: 0 10*3/uL (ref 0.00–0.07)
Basophils Absolute: 0 10*3/uL (ref 0.0–0.1)
Basophils Relative: 0 %
Eosinophils Absolute: 0.1 10*3/uL (ref 0.0–0.5)
Eosinophils Relative: 4 %
HCT: 24.1 % — ABNORMAL LOW (ref 39.0–52.0)
Hemoglobin: 8.1 g/dL — ABNORMAL LOW (ref 13.0–17.0)
Immature Granulocytes: 0 %
Lymphocytes Relative: 53 %
Lymphs Abs: 1 10*3/uL (ref 0.7–4.0)
MCH: 36.7 pg — ABNORMAL HIGH (ref 26.0–34.0)
MCHC: 33.6 g/dL (ref 30.0–36.0)
MCV: 109 fL — ABNORMAL HIGH (ref 80.0–100.0)
Monocytes Absolute: 0.2 10*3/uL (ref 0.1–1.0)
Monocytes Relative: 10 %
Neutro Abs: 0.6 10*3/uL — ABNORMAL LOW (ref 1.7–7.7)
Neutrophils Relative %: 33 %
Platelets: 100 10*3/uL — ABNORMAL LOW (ref 150–400)
RBC: 2.21 MIL/uL — ABNORMAL LOW (ref 4.22–5.81)
RDW: 15.2 % (ref 11.5–15.5)
WBC: 1.9 10*3/uL — ABNORMAL LOW (ref 4.0–10.5)
nRBC: 0 % (ref 0.0–0.2)

## 2022-10-23 LAB — MAGNESIUM: Magnesium: 2.2 mg/dL (ref 1.7–2.4)

## 2022-10-23 MED ORDER — SODIUM CHLORIDE 0.9% FLUSH
10.0000 mL | Freq: Once | INTRAVENOUS | Status: AC
  Administered 2022-10-23: 10 mL via INTRAVENOUS

## 2022-10-23 MED ORDER — SODIUM CHLORIDE 0.9 % IV SOLN
74.5000 mg/m2 | Freq: Once | INTRAVENOUS | Status: AC
  Administered 2022-10-23: 150 mg via INTRAVENOUS
  Filled 2022-10-23: qty 15

## 2022-10-23 MED ORDER — ONDANSETRON HCL 4 MG/2ML IJ SOLN
8.0000 mg | Freq: Once | INTRAMUSCULAR | Status: DC
Start: 2022-10-23 — End: 2022-10-23

## 2022-10-23 MED ORDER — PALONOSETRON HCL INJECTION 0.25 MG/5ML
0.2500 mg | Freq: Once | INTRAVENOUS | Status: AC
  Administered 2022-10-23: 0.25 mg via INTRAVENOUS
  Filled 2022-10-23: qty 5

## 2022-10-23 MED ORDER — SODIUM CHLORIDE 0.9 % IV SOLN: Freq: Once | INTRAVENOUS | Status: AC

## 2022-10-23 MED ORDER — HEPARIN SOD (PORK) LOCK FLUSH 100 UNIT/ML IV SOLN
500.0000 [IU] | Freq: Once | INTRAVENOUS | Status: AC | PRN
  Administered 2022-10-23: 500 [IU]

## 2022-10-23 MED ORDER — SODIUM CHLORIDE 0.9% FLUSH
10.0000 mL | INTRAVENOUS | Status: DC | PRN
  Administered 2022-10-23: 10 mL

## 2022-10-23 NOTE — Progress Notes (Signed)
Labs reviewed with Dr. Ellin Saba today. BUN 35, Creatinine 1.74 and ANC is 0.6. OK to treat per MD.   Treatment given per orders. Patient tolerated it well without problems. Vitals stable and discharged home from clinic ambulatory. Follow up as scheduled.

## 2022-10-23 NOTE — Patient Instructions (Signed)
[facility]  Discharge Instructions: Thank you for choosing [facility] to provide your oncology and hematology care.  If you have a lab appointment with the Cancer Center - please note that after April 8th, 2024, all labs will be drawn in the cancer center.  You do not have to check in or register with the main entrance as you have in the past but will complete your check-in in the cancer center.  Wear comfortable clothing and clothing appropriate for easy access to any Portacath or PICC line.   We strive to give you quality time with your provider. You may need to reschedule your appointment if you arrive late (15 or more minutes).  Arriving late affects you and other patients whose appointments are after yours.  Also, if you miss three or more appointments without notifying the office, you may be dismissed from the clinic at the provider's discretion.      For prescription refill requests, have your pharmacy contact our office and allow 72 hours for refills to be completed.    Today you received the following chemotherapy and/or immunotherapy agents Azacitadine   To help prevent nausea and vomiting after your treatment, we encourage you to take your nausea medication as directed.  BELOW ARE SYMPTOMS THAT SHOULD BE REPORTED IMMEDIATELY: *FEVER GREATER THAN 100.4 F (38 C) OR HIGHER *CHILLS OR SWEATING *NAUSEA AND VOMITING THAT IS NOT CONTROLLED WITH YOUR NAUSEA MEDICATION *UNUSUAL SHORTNESS OF BREATH *UNUSUAL BRUISING OR BLEEDING *URINARY PROBLEMS (pain or burning when urinating, or frequent urination) *BOWEL PROBLEMS (unusual diarrhea, constipation, pain near the anus) TENDERNESS IN MOUTH AND THROAT WITH OR WITHOUT PRESENCE OF ULCERS (sore throat, sores in mouth, or a toothache) UNUSUAL RASH, SWELLING OR PAIN  UNUSUAL VAGINAL DISCHARGE OR ITCHING   Items with * indicate a potential emergency and should be followed up as soon as possible or go to the  Emergency Department if any problems should occur.  Please show the CHEMOTHERAPY ALERT CARD or IMMUNOTHERAPY ALERT CARD at check-in to the Emergency Department and triage nurse.  Should you have questions after your visit or need to cancel or reschedule your appointment, please contact [facility] [phone]  and follow the prompts.  Office hours are 8:00 a.m. to 4:30 p.m. Monday - Friday. Please note that voicemails left after 4:00 p.m. may not be returned until the following business day.  We are closed weekends and major holidays. You have access to a nurse at all times for urgent questions. Please call the main number to the clinic [phone] and follow the prompts.  For any non-urgent questions, you may also contact your provider using MyChart. We now offer e-Visits for anyone 59 and older to request care online for non-urgent symptoms. For details visit [email or web address].   Also download the MyChart app! Go to the app store, search "MyChart", open the app, select [place], and log in with your MyChart username and password.

## 2022-10-24 ENCOUNTER — Inpatient Hospital Stay: Payer: PPO

## 2022-10-24 VITALS — BP 129/73 | HR 55 | Temp 97.9°F | Resp 18

## 2022-10-24 DIAGNOSIS — D469 Myelodysplastic syndrome, unspecified: Secondary | ICD-10-CM

## 2022-10-24 DIAGNOSIS — Z5111 Encounter for antineoplastic chemotherapy: Secondary | ICD-10-CM | POA: Diagnosis not present

## 2022-10-24 MED ORDER — SODIUM CHLORIDE 0.9 % IV SOLN
74.5000 mg/m2 | Freq: Once | INTRAVENOUS | Status: AC
  Administered 2022-10-24: 150 mg via INTRAVENOUS
  Filled 2022-10-24: qty 15

## 2022-10-24 MED ORDER — ONDANSETRON HCL 4 MG/2ML IJ SOLN: 8.0000 mg | Freq: Once | INTRAMUSCULAR | Status: DC

## 2022-10-24 MED ORDER — HEPARIN SOD (PORK) LOCK FLUSH 100 UNIT/ML IV SOLN
500.0000 [IU] | Freq: Once | INTRAVENOUS | Status: AC | PRN
  Administered 2022-10-24: 500 [IU]

## 2022-10-24 MED ORDER — SODIUM CHLORIDE 0.9 % IV SOLN: Freq: Once | INTRAVENOUS | Status: AC

## 2022-10-24 MED ORDER — SODIUM CHLORIDE 0.9% FLUSH
10.0000 mL | INTRAVENOUS | Status: DC | PRN
  Administered 2022-10-24: 10 mL

## 2022-10-24 NOTE — Progress Notes (Signed)
Patient presents today for chemotherapy infusion of Vidaza day 2.  Patient is in satisfactory condition with no new complaints voiced.  Vital signs are stable.  Labs reviewed and all labs are within treatment parameters with exception of ANC 0.6, BUN 35, and creatinine 1.75. Dr Ellin Saba aware, refer to note from 10/23/2022.  We will proceed with treatment per MD orders.    Patient tolerated treatment well with no complaints voiced.  Patient left ambulatory in stable condition.  Vital signs stable at discharge.  Follow up as scheduled.

## 2022-10-24 NOTE — Progress Notes (Signed)
Patient followed up 24-hours after first treatment, patient states no complaints/symptoms since treatment.

## 2022-10-24 NOTE — Patient Instructions (Signed)
[facility] [place]  Discharge Instructions: Thank you for choosing [facility] to provide your oncology and hematology care.  If you have a lab appointment with the Cancer Center - please note that after April 8th, 2024, all labs will be drawn in the cancer center.  You do not have to check in or register with the main entrance as you have in the past but will complete your check-in in the cancer center.  Wear comfortable clothing and clothing appropriate for easy access to any Portacath or PICC line.   We strive to give you quality time with your provider. You may need to reschedule your appointment if you arrive late (15 or more minutes).  Arriving late affects you and other patients whose appointments are after yours.  Also, if you miss three or more appointments without notifying the office, you may be dismissed from the clinic at the provider's discretion.      For prescription refill requests, have your pharmacy contact our office and allow 72 hours for refills to be completed.    Today you received the following chemotherapy and/or immunotherapy agents Vidaza.  Azacitidine Injection What is this medication? AZACITIDINE (ay za SITE i deen) treats blood and bone marrow cancers. It works by slowing down the growth of cancer cells. This medicine may be used for other purposes; ask your health care provider or pharmacist if you have questions. COMMON BRAND NAME(S): Vidaza What should I tell my care team before I take this medication? They need to know if you have any of these conditions: Kidney disease Liver disease Low blood cell levels, such as low white cells, platelets, or red blood cells Low levels of albumin in the blood Low levels of bicarbonate in the blood An unusual or allergic reaction to azacitidine, mannitol, other medications, foods, dyes, or preservatives If you or your partner are pregnant or trying to get pregnant Breast-feeding How should I  use this medication? This medication is injected into a vein or under the skin. It is given by your care team in a hospital or clinic setting. Talk to your care team about the use of this medication in children. While it may be prescribed for children as young as 1 month for selected conditions, precautions do apply. Overdosage: If you think you have taken too much of this medicine contact a poison control center or emergency room at once. NOTE: This medicine is only for you. Do not share this medicine with others. What if I miss a dose? Keep appointments for follow-up doses. It is important not to miss your dose. Call your care team if you are unable to keep an appointment. What may interact with this medication? Interactions are not expected. This list may not describe all possible interactions. Give your health care provider a list of all the medicines, herbs, non-prescription drugs, or dietary supplements you use. Also tell them if you smoke, drink alcohol, or use illegal drugs. Some items may interact with your medicine. What should I watch for while using this medication? Your condition will be monitored carefully while you are receiving this medication. You may need blood work while taking this medication. This medication may make you feel generally unwell. This is not uncommon as chemotherapy can affect healthy cells as well as cancer cells. Report any side effects. Continue your course of treatment even though you feel ill unless your care team tells you to stop. Other product types may be available that contain the   medication azacitidine. The injection and oral products should not be used in place of one another. Talk to your care team if you have questions. This medication can cause serious side effects. To reduce the risk, your care team may give you other medications to take before receiving this one. Be sure to follow the directions from your care team. This medication may increase your  risk of getting an infection. Call your care team for advice if you get a fever, chills, sore throat, or other symptoms of a cold or flu. Do not treat yourself. Try to avoid being around people who are sick. Avoid taking medications that contain aspirin, acetaminophen, ibuprofen, naproxen, or ketoprofen unless instructed by your care team. These medications may hide a fever. This medication may increase your risk to bruise or bleed. Call your care team if you notice any unusual bleeding. Be careful brushing or flossing your teeth or using a toothpick because you may get an infection or bleed more easily. If you have any dental work done, tell your dentist you are receiving this medication. Talk to your care team if you or your partner may be pregnant. Serious birth defects can occur if you take this medication during pregnancy and for 6 months after the last dose. You will need a negative pregnancy test before starting this medication. Contraception is recommended while taking his medication and for 6 months after the last dose. Your care team can help you find the option that works for you. If your partner can get pregnant, use a condom during sex while taking this medication and for 3 months after the last dose. Do not breastfeed while taking this medication and for 1 week after the last dose. This medication may cause infertility. Talk to your care team if you are concerned about your fertility. What side effects may I notice from receiving this medication? Side effects that you should report to your care team as soon as possible: Allergic reactions--skin rash, itching, hives, swelling of the face, lips, tongue, or throat Infection--fever, chills, cough, sore throat, wounds that don't heal, pain or trouble when passing urine, general feeling of discomfort or being unwell Kidney injury--decrease in the amount of urine, swelling of the ankles, hands, or feet Liver injury--right upper belly pain, loss  of appetite, nausea, light-colored stool, dark yellow or brown urine, yellowing skin or eyes, unusual weakness or fatigue Low red blood cell level--unusual weakness or fatigue, dizziness, headache, trouble breathing Tumor lysis syndrome (TLS)--nausea, vomiting, diarrhea, decrease in the amount of urine, dark urine, unusual weakness or fatigue, confusion, muscle pain or cramps, fast or irregular heartbeat, joint pain Unusual bruising or bleeding Side effects that usually do not require medical attention (report to your care team if they continue or are bothersome): Constipation Diarrhea Nausea Pain, redness, or irritation at injection site Vomiting This list may not describe all possible side effects. Call your doctor for medical advice about side effects. You may report side effects to FDA at 1-800-FDA-1088. Where should I keep my medication? This medication is given in a hospital or clinic. It will not be stored at home. NOTE: This sheet is a summary. It may not cover all possible information. If you have questions about this medicine, talk to your doctor, pharmacist, or health care provider.  2024 Elsevier/Gold Standard (2021-09-22 00:00:00)        To help prevent nausea and vomiting after your treatment, we encourage you to take your nausea medication as directed.  BELOW ARE SYMPTOMS   THAT SHOULD BE REPORTED IMMEDIATELY: *FEVER GREATER THAN 100.4 F (38 C) OR HIGHER *CHILLS OR SWEATING *NAUSEA AND VOMITING THAT IS NOT CONTROLLED WITH YOUR NAUSEA MEDICATION *UNUSUAL SHORTNESS OF BREATH *UNUSUAL BRUISING OR BLEEDING *URINARY PROBLEMS (pain or burning when urinating, or frequent urination) *BOWEL PROBLEMS (unusual diarrhea, constipation, pain near the anus) TENDERNESS IN MOUTH AND THROAT WITH OR WITHOUT PRESENCE OF ULCERS (sore throat, sores in mouth, or a toothache) UNUSUAL RASH, SWELLING OR PAIN  UNUSUAL VAGINAL DISCHARGE OR ITCHING   Items with * indicate a potential emergency and  should be followed up as soon as possible or go to the Emergency Department if any problems should occur.  Please show the CHEMOTHERAPY ALERT CARD or IMMUNOTHERAPY ALERT CARD at check-in to the Emergency Department and triage nurse.  Should you have questions after your visit or need to cancel or reschedule your appointment, please contact [facility] [place] [phone]  and follow the prompts.  Office hours are 8:00 a.m. to 4:30 p.m. Monday - Friday. Please note that voicemails left after 4:00 p.m. may not be returned until the following business day.  We are closed weekends and major holidays. You have access to a nurse at all times for urgent questions. Please call the main number to the clinic [phone] and follow the prompts.  For any non-urgent questions, you may also contact your provider using MyChart. We now offer e-Visits for anyone 18 and older to request care online for non-urgent symptoms. For details visit [email or web address].   Also download the MyChart app! Go to the app store, search "MyChart", open the app, select , and log in with your MyChart username and password.   

## 2022-10-25 ENCOUNTER — Inpatient Hospital Stay: Payer: PPO

## 2022-10-25 VITALS — BP 134/76 | HR 56 | Temp 98.1°F | Resp 18

## 2022-10-25 DIAGNOSIS — D469 Myelodysplastic syndrome, unspecified: Secondary | ICD-10-CM

## 2022-10-25 DIAGNOSIS — Z5111 Encounter for antineoplastic chemotherapy: Secondary | ICD-10-CM | POA: Diagnosis not present

## 2022-10-25 MED ORDER — PALONOSETRON HCL INJECTION 0.25 MG/5ML
0.2500 mg | Freq: Once | INTRAVENOUS | Status: AC
  Administered 2022-10-25: 0.25 mg via INTRAVENOUS
  Filled 2022-10-25: qty 5

## 2022-10-25 MED ORDER — SODIUM CHLORIDE 0.9% FLUSH
10.0000 mL | INTRAVENOUS | Status: DC | PRN
  Administered 2022-10-25: 10 mL

## 2022-10-25 MED ORDER — SODIUM CHLORIDE 0.9 % IV SOLN
74.5000 mg/m2 | Freq: Once | INTRAVENOUS | Status: AC
  Administered 2022-10-25: 150 mg via INTRAVENOUS
  Filled 2022-10-25: qty 15

## 2022-10-25 MED ORDER — HEPARIN SOD (PORK) LOCK FLUSH 100 UNIT/ML IV SOLN
500.0000 [IU] | Freq: Once | INTRAVENOUS | Status: AC | PRN
  Administered 2022-10-25: 500 [IU]

## 2022-10-25 MED ORDER — SODIUM CHLORIDE 0.9 % IV SOLN: Freq: Once | INTRAVENOUS | Status: AC

## 2022-10-25 NOTE — Patient Instructions (Signed)
[facility]  Discharge Instructions: Thank you for choosing [facility] to provide your oncology and hematology care.  If you have a lab appointment with the Cancer Center - please note that after April 8th, 2024, all labs will be drawn in the cancer center.  You do not have to check in or register with the main entrance as you have in the past but will complete your check-in in the cancer center.  Wear comfortable clothing and clothing appropriate for easy access to any Portacath or PICC line.   We strive to give you quality time with your provider. You may need to reschedule your appointment if you arrive late (15 or more minutes).  Arriving late affects you and other patients whose appointments are after yours.  Also, if you miss three or more appointments without notifying the office, you may be dismissed from the clinic at the provider's discretion.      For prescription refill requests, have your pharmacy contact our office and allow 72 hours for refills to be completed.    Today you received the following chemotherapy and/or immunotherapy agents D3 Vidaza   To help prevent nausea and vomiting after your treatment, we encourage you to take your nausea medication as directed.  Azacitidine Injection What is this medication? AZACITIDINE (ay za SITE i deen) treats blood and bone marrow cancers. It works by slowing down the growth of cancer cells. This medicine may be used for other purposes; ask your health care provider or pharmacist if you have questions. COMMON BRAND NAME(S): Vidaza What should I tell my care team before I take this medication? They need to know if you have any of these conditions: Kidney disease Liver disease Low blood cell levels, such as low white cells, platelets, or red blood cells Low levels of albumin in the blood Low levels of bicarbonate in the blood An unusual or allergic reaction to azacitidine, mannitol, other  medications, foods, dyes, or preservatives If you or your partner are pregnant or trying to get pregnant Breast-feeding How should I use this medication? This medication is injected into a vein or under the skin. It is given by your care team in a hospital or clinic setting. Talk to your care team about the use of this medication in children. While it may be prescribed for children as young as 1 month for selected conditions, precautions do apply. Overdosage: If you think you have taken too much of this medicine contact a poison control center or emergency room at once. NOTE: This medicine is only for you. Do not share this medicine with others. What if I miss a dose? Keep appointments for follow-up doses. It is important not to miss your dose. Call your care team if you are unable to keep an appointment. What may interact with this medication? Interactions are not expected. This list may not describe all possible interactions. Give your health care provider a list of all the medicines, herbs, non-prescription drugs, or dietary supplements you use. Also tell them if you smoke, drink alcohol, or use illegal drugs. Some items may interact with your medicine. What should I watch for while using this medication? Your condition will be monitored carefully while you are receiving this medication. You may need blood work while taking this medication. This medication may make you feel generally unwell. This is not uncommon as chemotherapy can affect healthy cells as well as cancer cells. Report any side effects. Continue your course of treatment  even though you feel ill unless your care team tells you to stop. Other product types may be available that contain the medication azacitidine. The injection and oral products should not be used in place of one another. Talk to your care team if you have questions. This medication can cause serious side effects. To reduce the risk, your care team may give you other  medications to take before receiving this one. Be sure to follow the directions from your care team. This medication may increase your risk of getting an infection. Call your care team for advice if you get a fever, chills, sore throat, or other symptoms of a cold or flu. Do not treat yourself. Try to avoid being around people who are sick. Avoid taking medications that contain aspirin, acetaminophen, ibuprofen, naproxen, or ketoprofen unless instructed by your care team. These medications may hide a fever. This medication may increase your risk to bruise or bleed. Call your care team if you notice any unusual bleeding. Be careful brushing or flossing your teeth or using a toothpick because you may get an infection or bleed more easily. If you have any dental work done, tell your dentist you are receiving this medication. Talk to your care team if you or your partner may be pregnant. Serious birth defects can occur if you take this medication during pregnancy and for 6 months after the last dose. You will need a negative pregnancy test before starting this medication. Contraception is recommended while taking his medication and for 6 months after the last dose. Your care team can help you find the option that works for you. If your partner can get pregnant, use a condom during sex while taking this medication and for 3 months after the last dose. Do not breastfeed while taking this medication and for 1 week after the last dose. This medication may cause infertility. Talk to your care team if you are concerned about your fertility. What side effects may I notice from receiving this medication? Side effects that you should report to your care team as soon as possible: Allergic reactions--skin rash, itching, hives, swelling of the face, lips, tongue, or throat Infection--fever, chills, cough, sore throat, wounds that don't heal, pain or trouble when passing urine, general feeling of discomfort or being  unwell Kidney injury--decrease in the amount of urine, swelling of the ankles, hands, or feet Liver injury--right upper belly pain, loss of appetite, nausea, light-colored stool, dark yellow or brown urine, yellowing skin or eyes, unusual weakness or fatigue Low red blood cell level--unusual weakness or fatigue, dizziness, headache, trouble breathing Tumor lysis syndrome (TLS)--nausea, vomiting, diarrhea, decrease in the amount of urine, dark urine, unusual weakness or fatigue, confusion, muscle pain or cramps, fast or irregular heartbeat, joint pain Unusual bruising or bleeding Side effects that usually do not require medical attention (report to your care team if they continue or are bothersome): Constipation Diarrhea Nausea Pain, redness, or irritation at injection site Vomiting This list may not describe all possible side effects. Call your doctor for medical advice about side effects. You may report side effects to FDA at 1-800-FDA-1088. Where should I keep my medication? This medication is given in a hospital or clinic. It will not be stored at home. NOTE: This sheet is a summary. It may not cover all possible information. If you have questions about this medicine, talk to your doctor, pharmacist, or health care provider.  2024 Elsevier/Gold Standard (2021-09-22 00:00:00)   BELOW ARE SYMPTOMS THAT SHOULD BE  REPORTED IMMEDIATELY: *FEVER GREATER THAN 100.4 F (38 C) OR HIGHER *CHILLS OR SWEATING *NAUSEA AND VOMITING THAT IS NOT CONTROLLED WITH YOUR NAUSEA MEDICATION *UNUSUAL SHORTNESS OF BREATH *UNUSUAL BRUISING OR BLEEDING *URINARY PROBLEMS (pain or burning when urinating, or frequent urination) *BOWEL PROBLEMS (unusual diarrhea, constipation, pain near the anus) TENDERNESS IN MOUTH AND THROAT WITH OR WITHOUT PRESENCE OF ULCERS (sore throat, sores in mouth, or a toothache) UNUSUAL RASH, SWELLING OR PAIN  UNUSUAL VAGINAL DISCHARGE OR ITCHING   Items with * indicate a potential  emergency and should be followed up as soon as possible or go to the Emergency Department if any problems should occur.  Please show the CHEMOTHERAPY ALERT CARD or IMMUNOTHERAPY ALERT CARD at check-in to the Emergency Department and triage nurse.  Should you have questions after your visit or need to cancel or reschedule your appointment, please contact [facility] [phone]  and follow the prompts.  Office hours are 8:00 a.m. to 4:30 p.m. Monday - Friday. Please note that voicemails left after 4:00 p.m. may not be returned until the following business day.  We are closed weekends and major holidays. You have access to a nurse at all times for urgent questions. Please call the main number to the clinic [phone] and follow the prompts.  For any non-urgent questions, you may also contact your provider using MyChart. We now offer e-Visits for anyone 31 and older to request care online for non-urgent symptoms. For details visit [email or web address].   Also download the MyChart app! Go to the app store, search "MyChart", open the app, select [place], and log in with your MyChart username and password.

## 2022-10-25 NOTE — Progress Notes (Addendum)
Changed Vidaza premedication from Zofran to Aloxi given on MWF due drug interaction with Amiodarone.  No auth required.   Anola Gurney [place], [place], BCPS, BCOP 10/23/2022  1:45 PM

## 2022-10-25 NOTE — Progress Notes (Signed)
Patient presents today for D3 Vidaza infusion. Patient is in satisfactory condition with no new complaints voiced.  Vital signs are stable.  Labs reviewed from 10/23/22 and all labs are within treatment parameters.  We will proceed with treatment per MD orders.    D3 Vidaza given today per MD orders. Tolerated infusion without adverse affects. Vital signs stable. No complaints at this time. Discharged from clinic ambulatory in stable condition. Alert and oriented x 3. F/U with [facility] as scheduled.

## 2022-10-26 ENCOUNTER — Inpatient Hospital Stay: Payer: PPO

## 2022-10-26 VITALS — BP 121/69 | HR 49 | Temp 97.5°F | Resp 16

## 2022-10-26 DIAGNOSIS — Z5111 Encounter for antineoplastic chemotherapy: Secondary | ICD-10-CM | POA: Diagnosis not present

## 2022-10-26 DIAGNOSIS — D469 Myelodysplastic syndrome, unspecified: Secondary | ICD-10-CM

## 2022-10-26 MED ORDER — SODIUM CHLORIDE 0.9 % IV SOLN: Freq: Once | INTRAVENOUS | Status: AC

## 2022-10-26 MED ORDER — SODIUM CHLORIDE 0.9% FLUSH
10.0000 mL | INTRAVENOUS | Status: DC | PRN
  Administered 2022-10-26: 10 mL

## 2022-10-26 MED ORDER — HEPARIN SOD (PORK) LOCK FLUSH 100 UNIT/ML IV SOLN
500.0000 [IU] | Freq: Once | INTRAVENOUS | Status: AC | PRN
  Administered 2022-10-26: 500 [IU]

## 2022-10-26 MED ORDER — SODIUM CHLORIDE 0.9 % IV SOLN
74.5000 mg/m2 | Freq: Once | INTRAVENOUS | Status: AC
  Administered 2022-10-26: 150 mg via INTRAVENOUS
  Filled 2022-10-26: qty 15

## 2022-10-26 NOTE — Progress Notes (Signed)
Patient presents today for chemotherapy infusion.  Patient is in satisfactory condition with no new complaints voiced.  Vital signs are stable.  Labs from 10/23/22 all reviewed by MD.  We will proceed with treatment per MD orders.    Patient tolerated treatment well with no complaints voiced.  Patient left ambulatory in stable condition.  Vital signs stable at discharge.  Follow up as scheduled.

## 2022-10-26 NOTE — Patient Instructions (Signed)
[facility]  Discharge Instructions: Thank you for choosing [facility] to provide your oncology and hematology care.  If you have a lab appointment with the Cancer Center - please note that after April 8th, 2024, all labs will be drawn in the cancer center.  You do not have to check in or register with the main entrance as you have in the past but will complete your check-in in the cancer center.  Wear comfortable clothing and clothing appropriate for easy access to any Portacath or PICC line.   We strive to give you quality time with your provider. You may need to reschedule your appointment if you arrive late (15 or more minutes).  Arriving late affects you and other patients whose appointments are after yours.  Also, if you miss three or more appointments without notifying the office, you may be dismissed from the clinic at the provider's discretion.      For prescription refill requests, have your pharmacy contact our office and allow 72 hours for refills to be completed.    Today you received the following chemotherapy and/or immunotherapy agents Vidaza.  Azacitidine Injection What is this medication? AZACITIDINE (ay za SITE i deen) treats blood and bone marrow cancers. It works by slowing down the growth of cancer cells. This medicine may be used for other purposes; ask your health care provider or pharmacist if you have questions. COMMON BRAND NAME(S): Vidaza What should I tell my care team before I take this medication? They need to know if you have any of these conditions: Kidney disease Liver disease Low blood cell levels, such as low white cells, platelets, or red blood cells Low levels of albumin in the blood Low levels of bicarbonate in the blood An unusual or allergic reaction to azacitidine, mannitol, other medications, foods, dyes, or preservatives If you or your partner are pregnant or trying to get pregnant Breast-feeding How should I  use this medication? This medication is injected into a vein or under the skin. It is given by your care team in a hospital or clinic setting. Talk to your care team about the use of this medication in children. While it may be prescribed for children as young as 1 month for selected conditions, precautions do apply. Overdosage: If you think you have taken too much of this medicine contact a poison control center or emergency room at once. NOTE: This medicine is only for you. Do not share this medicine with others. What if I miss a dose? Keep appointments for follow-up doses. It is important not to miss your dose. Call your care team if you are unable to keep an appointment. What may interact with this medication? Interactions are not expected. This list may not describe all possible interactions. Give your health care provider a list of all the medicines, herbs, non-prescription drugs, or dietary supplements you use. Also tell them if you smoke, drink alcohol, or use illegal drugs. Some items may interact with your medicine. What should I watch for while using this medication? Your condition will be monitored carefully while you are receiving this medication. You may need blood work while taking this medication. This medication may make you feel generally unwell. This is not uncommon as chemotherapy can affect healthy cells as well as cancer cells. Report any side effects. Continue your course of treatment even though you feel ill unless your care team tells you to stop. Other product types may be available that contain the   medication azacitidine. The injection and oral products should not be used in place of one another. Talk to your care team if you have questions. This medication can cause serious side effects. To reduce the risk, your care team may give you other medications to take before receiving this one. Be sure to follow the directions from your care team. This medication may increase your  risk of getting an infection. Call your care team for advice if you get a fever, chills, sore throat, or other symptoms of a cold or flu. Do not treat yourself. Try to avoid being around people who are sick. Avoid taking medications that contain aspirin, acetaminophen, ibuprofen, naproxen, or ketoprofen unless instructed by your care team. These medications may hide a fever. This medication may increase your risk to bruise or bleed. Call your care team if you notice any unusual bleeding. Be careful brushing or flossing your teeth or using a toothpick because you may get an infection or bleed more easily. If you have any dental work done, tell your dentist you are receiving this medication. Talk to your care team if you or your partner may be pregnant. Serious birth defects can occur if you take this medication during pregnancy and for 6 months after the last dose. You will need a negative pregnancy test before starting this medication. Contraception is recommended while taking his medication and for 6 months after the last dose. Your care team can help you find the option that works for you. If your partner can get pregnant, use a condom during sex while taking this medication and for 3 months after the last dose. Do not breastfeed while taking this medication and for 1 week after the last dose. This medication may cause infertility. Talk to your care team if you are concerned about your fertility. What side effects may I notice from receiving this medication? Side effects that you should report to your care team as soon as possible: Allergic reactions--skin rash, itching, hives, swelling of the face, lips, tongue, or throat Infection--fever, chills, cough, sore throat, wounds that don't heal, pain or trouble when passing urine, general feeling of discomfort or being unwell Kidney injury--decrease in the amount of urine, swelling of the ankles, hands, or feet Liver injury--right upper belly pain, loss  of appetite, nausea, light-colored stool, dark yellow or [place] urine, yellowing skin or eyes, unusual weakness or fatigue Low red blood cell level--unusual weakness or fatigue, dizziness, headache, trouble breathing Tumor lysis syndrome (TLS)--nausea, vomiting, diarrhea, decrease in the amount of urine, dark urine, unusual weakness or fatigue, confusion, muscle pain or cramps, fast or irregular heartbeat, joint pain Unusual bruising or bleeding Side effects that usually do not require medical attention (report to your care team if they continue or are bothersome): Constipation Diarrhea Nausea Pain, redness, or irritation at injection site Vomiting This list may not describe all possible side effects. Call your doctor for medical advice about side effects. You may report side effects to FDA at 1-800-FDA-1088. Where should I keep my medication? This medication is given in a hospital or clinic. It will not be stored at home. NOTE: This sheet is a summary. It may not cover all possible information. If you have questions about this medicine, talk to your doctor, pharmacist, or health care provider.  2024 Elsevier/Gold Standard (2021-09-22 00:00:00)        To help prevent nausea and vomiting after your treatment, we encourage you to take your nausea medication as directed.  BELOW ARE SYMPTOMS   THAT SHOULD BE REPORTED IMMEDIATELY: *FEVER GREATER THAN 100.4 F (38 C) OR HIGHER *CHILLS OR SWEATING *NAUSEA AND VOMITING THAT IS NOT CONTROLLED WITH YOUR NAUSEA MEDICATION *UNUSUAL SHORTNESS OF BREATH *UNUSUAL BRUISING OR BLEEDING *URINARY PROBLEMS (pain or burning when urinating, or frequent urination) *BOWEL PROBLEMS (unusual diarrhea, constipation, pain near the anus) TENDERNESS IN MOUTH AND THROAT WITH OR WITHOUT PRESENCE OF ULCERS (sore throat, sores in mouth, or a toothache) UNUSUAL RASH, SWELLING OR PAIN  UNUSUAL VAGINAL DISCHARGE OR ITCHING   Items with * indicate a potential emergency and  should be followed up as soon as possible or go to the Emergency Department if any problems should occur.  Please show the CHEMOTHERAPY ALERT CARD or IMMUNOTHERAPY ALERT CARD at check-in to the Emergency Department and triage nurse.  Should you have questions after your visit or need to cancel or reschedule your appointment, please contact [facility] [phone]  and follow the prompts.  Office hours are 8:00 a.m. to 4:30 p.m. Monday - Friday. Please note that voicemails left after 4:00 p.m. may not be returned until the following business day.  We are closed weekends and major holidays. You have access to a nurse at all times for urgent questions. Please call the main number to the clinic [phone] and follow the prompts.  For any non-urgent questions, you may also contact your provider using MyChart. We now offer e-Visits for anyone 18 and older to request care online for non-urgent symptoms. For details visit [email or web address].   Also download the MyChart app! Go to the app store, search "MyChart", open the app, select [place], and log in with your MyChart username and password.   

## 2022-10-27 ENCOUNTER — Inpatient Hospital Stay: Payer: PPO

## 2022-10-27 VITALS — BP 127/65 | HR 56 | Temp 97.6°F | Resp 18

## 2022-10-27 DIAGNOSIS — D469 Myelodysplastic syndrome, unspecified: Secondary | ICD-10-CM

## 2022-10-27 DIAGNOSIS — Z5111 Encounter for antineoplastic chemotherapy: Secondary | ICD-10-CM | POA: Diagnosis not present

## 2022-10-27 MED ORDER — SODIUM CHLORIDE 0.9% FLUSH
10.0000 mL | INTRAVENOUS | Status: DC | PRN
  Administered 2022-10-27: 10 mL

## 2022-10-27 MED ORDER — PALONOSETRON HCL INJECTION 0.25 MG/5ML
0.2500 mg | Freq: Once | INTRAVENOUS | Status: AC
  Administered 2022-10-27: 0.25 mg via INTRAVENOUS
  Filled 2022-10-27: qty 5

## 2022-10-27 MED ORDER — SODIUM CHLORIDE 0.9 % IV SOLN: Freq: Once | INTRAVENOUS | Status: AC

## 2022-10-27 MED ORDER — SODIUM CHLORIDE 0.9 % IV SOLN
74.5000 mg/m2 | Freq: Once | INTRAVENOUS | Status: AC
  Administered 2022-10-27: 150 mg via INTRAVENOUS
  Filled 2022-10-27: qty 15

## 2022-10-27 MED ORDER — HEPARIN SOD (PORK) LOCK FLUSH 100 UNIT/ML IV SOLN
500.0000 [IU] | Freq: Once | INTRAVENOUS | Status: AC | PRN
  Administered 2022-10-27: 500 [IU]

## 2022-10-27 NOTE — Progress Notes (Signed)
Patient presents today for chemotherapy infusion.  Patient is in satisfactory condition with no new complaints voiced.  Vital signs are stable.  Labs from 10/23/22 reviewed by MD. We will proceed with treatment per MD orders.   Patient tolerated treatment well with no complaints voiced.  Patient left ambulatory in stable condition.  Vital signs stable at discharge.  Follow up as scheduled.

## 2022-10-27 NOTE — Patient Instructions (Signed)
[facility] [place]  Discharge Instructions: Thank you for choosing [facility] to provide your oncology and hematology care.  If you have a lab appointment with the Cancer Center - please note that after April 8th, 2024, all labs will be drawn in the cancer center.  You do not have to check in or register with the main entrance as you have in the past but will complete your check-in in the cancer center.  Wear comfortable clothing and clothing appropriate for easy access to any Portacath or PICC line.   We strive to give you quality time with your provider. You may need to reschedule your appointment if you arrive late (15 or more minutes).  Arriving late affects you and other patients whose appointments are after yours.  Also, if you miss three or more appointments without notifying the office, you may be dismissed from the clinic at the provider's discretion.      For prescription refill requests, have your pharmacy contact our office and allow 72 hours for refills to be completed.    Today you received the following chemotherapy and/or immunotherapy agents Vidaza.  Azacitidine Injection What is this medication? AZACITIDINE (ay za SITE i deen) treats blood and bone marrow cancers. It works by slowing down the growth of cancer cells. This medicine may be used for other purposes; ask your health care provider or pharmacist if you have questions. COMMON BRAND NAME(S): Vidaza What should I tell my care team before I take this medication? They need to know if you have any of these conditions: Kidney disease Liver disease Low blood cell levels, such as low white cells, platelets, or red blood cells Low levels of albumin in the blood Low levels of bicarbonate in the blood An unusual or allergic reaction to azacitidine, mannitol, other medications, foods, dyes, or preservatives If you or your partner are pregnant or trying to get pregnant Breast-feeding How should I  use this medication? This medication is injected into a vein or under the skin. It is given by your care team in a hospital or clinic setting. Talk to your care team about the use of this medication in children. While it may be prescribed for children as young as 1 month for selected conditions, precautions do apply. Overdosage: If you think you have taken too much of this medicine contact a poison control center or emergency room at once. NOTE: This medicine is only for you. Do not share this medicine with others. What if I miss a dose? Keep appointments for follow-up doses. It is important not to miss your dose. Call your care team if you are unable to keep an appointment. What may interact with this medication? Interactions are not expected. This list may not describe all possible interactions. Give your health care provider a list of all the medicines, herbs, non-prescription drugs, or dietary supplements you use. Also tell them if you smoke, drink alcohol, or use illegal drugs. Some items may interact with your medicine. What should I watch for while using this medication? Your condition will be monitored carefully while you are receiving this medication. You may need blood work while taking this medication. This medication may make you feel generally unwell. This is not uncommon as chemotherapy can affect healthy cells as well as cancer cells. Report any side effects. Continue your course of treatment even though you feel ill unless your care team tells you to stop. Other product types may be available that contain the   medication azacitidine. The injection and oral products should not be used in place of one another. Talk to your care team if you have questions. This medication can cause serious side effects. To reduce the risk, your care team may give you other medications to take before receiving this one. Be sure to follow the directions from your care team. This medication may increase your  risk of getting an infection. Call your care team for advice if you get a fever, chills, sore throat, or other symptoms of a cold or flu. Do not treat yourself. Try to avoid being around people who are sick. Avoid taking medications that contain aspirin, acetaminophen, ibuprofen, naproxen, or ketoprofen unless instructed by your care team. These medications may hide a fever. This medication may increase your risk to bruise or bleed. Call your care team if you notice any unusual bleeding. Be careful brushing or flossing your teeth or using a toothpick because you may get an infection or bleed more easily. If you have any dental work done, tell your dentist you are receiving this medication. Talk to your care team if you or your partner may be pregnant. Serious birth defects can occur if you take this medication during pregnancy and for 6 months after the last dose. You will need a negative pregnancy test before starting this medication. Contraception is recommended while taking his medication and for 6 months after the last dose. Your care team can help you find the option that works for you. If your partner can get pregnant, use a condom during sex while taking this medication and for 3 months after the last dose. Do not breastfeed while taking this medication and for 1 week after the last dose. This medication may cause infertility. Talk to your care team if you are concerned about your fertility. What side effects may I notice from receiving this medication? Side effects that you should report to your care team as soon as possible: Allergic reactions--skin rash, itching, hives, swelling of the face, lips, tongue, or throat Infection--fever, chills, cough, sore throat, wounds that don't heal, pain or trouble when passing urine, general feeling of discomfort or being unwell Kidney injury--decrease in the amount of urine, swelling of the ankles, hands, or feet Liver injury--right upper belly pain, loss  of appetite, nausea, light-colored stool, dark yellow or [place] Vidana urine, yellowing skin or eyes, unusual weakness or fatigue Low red blood cell level--unusual weakness or fatigue, dizziness, headache, trouble breathing Tumor lysis syndrome (TLS)--nausea, vomiting, diarrhea, decrease in the amount of urine, dark urine, unusual weakness or fatigue, confusion, muscle pain or cramps, fast or irregular heartbeat, joint pain Unusual bruising or bleeding Side effects that usually do not require medical attention (report to your care team if they continue or are bothersome): Constipation Diarrhea Nausea Pain, redness, or irritation at injection site Vomiting This list may not describe all possible side effects. Call your doctor for medical advice about side effects. You may report side effects to FDA at 1-800-FDA-1088. Where should I keep my medication? This medication is given in a hospital or clinic. It will not be stored at home. NOTE: This sheet is a summary. It may not cover all possible information. If you have questions about this medicine, talk to your doctor, pharmacist, or health care provider.  2024 Elsevier/Gold Standard (2021-09-22 00:00:00)        To help prevent nausea and vomiting after your treatment, we encourage you to take your nausea medication as directed.  BELOW ARE SYMPTOMS   THAT SHOULD BE REPORTED IMMEDIATELY: *FEVER GREATER THAN 100.4 F (38 C) OR HIGHER *CHILLS OR SWEATING *NAUSEA AND VOMITING THAT IS NOT CONTROLLED WITH YOUR NAUSEA MEDICATION *UNUSUAL SHORTNESS OF BREATH *UNUSUAL BRUISING OR BLEEDING *URINARY PROBLEMS (pain or burning when urinating, or frequent urination) *BOWEL PROBLEMS (unusual diarrhea, constipation, pain near the anus) TENDERNESS IN MOUTH AND THROAT WITH OR WITHOUT PRESENCE OF ULCERS (sore throat, sores in mouth, or a toothache) UNUSUAL RASH, SWELLING OR PAIN  UNUSUAL VAGINAL DISCHARGE OR ITCHING   Items with * indicate a potential emergency and  should be followed up as soon as possible or go to the Emergency Department if any problems should occur.  Please show the CHEMOTHERAPY ALERT CARD or IMMUNOTHERAPY ALERT CARD at check-in to the Emergency Department and triage nurse.  Should you have questions after your visit or need to cancel or reschedule your appointment, please contact [facility] [phone]  and follow the prompts.  Office hours are 8:00 a.m. to 4:30 p.m. Monday - Friday. Please note that voicemails left after 4:00 p.m. may not be returned until the following business day.  We are closed weekends and major holidays. You have access to a nurse at all times for urgent questions. Please call the main number to the clinic [phone] and follow the prompts.  For any non-urgent questions, you may also contact your provider using MyChart. We now offer e-Visits for anyone 18 and older to request care online for non-urgent symptoms. For details visit [email or web address].   Also download the MyChart app! Go to the app store, search "MyChart", open the app, select [place], and log in with your MyChart username and password.   

## 2022-10-30 ENCOUNTER — Other Ambulatory Visit: Payer: Self-pay

## 2022-10-30 ENCOUNTER — Inpatient Hospital Stay: Payer: PPO

## 2022-10-30 VITALS — BP 150/75 | HR 67 | Temp 97.2°F | Resp 18 | Wt 186.2 lb

## 2022-10-30 DIAGNOSIS — Z5111 Encounter for antineoplastic chemotherapy: Secondary | ICD-10-CM | POA: Diagnosis not present

## 2022-10-30 DIAGNOSIS — D469 Myelodysplastic syndrome, unspecified: Secondary | ICD-10-CM

## 2022-10-30 DIAGNOSIS — Z95828 Presence of other vascular implants and grafts: Secondary | ICD-10-CM

## 2022-10-30 LAB — CBC WITH DIFFERENTIAL/PLATELET
Abs Immature Granulocytes: 0 10*3/uL (ref 0.00–0.07)
Basophils Absolute: 0 10*3/uL (ref 0.0–0.1)
Basophils Relative: 1 %
Eosinophils Absolute: 0.1 10*3/uL (ref 0.0–0.5)
Eosinophils Relative: 4 %
HCT: 23 % — ABNORMAL LOW (ref 39.0–52.0)
Hemoglobin: 7.8 g/dL — ABNORMAL LOW (ref 13.0–17.0)
Immature Granulocytes: 0 %
Lymphocytes Relative: 57 %
Lymphs Abs: 1.2 10*3/uL (ref 0.7–4.0)
MCH: 36.6 pg — ABNORMAL HIGH (ref 26.0–34.0)
MCHC: 33.9 g/dL (ref 30.0–36.0)
MCV: 108 fL — ABNORMAL HIGH (ref 80.0–100.0)
Monocytes Absolute: 0.2 10*3/uL (ref 0.1–1.0)
Monocytes Relative: 10 %
Neutro Abs: 0.6 10*3/uL — ABNORMAL LOW (ref 1.7–7.7)
Neutrophils Relative %: 28 %
Platelets: 100 10*3/uL — ABNORMAL LOW (ref 150–400)
RBC: 2.13 MIL/uL — ABNORMAL LOW (ref 4.22–5.81)
RDW: 14.7 % (ref 11.5–15.5)
WBC: 2 10*3/uL — ABNORMAL LOW (ref 4.0–10.5)
nRBC: 0 % (ref 0.0–0.2)

## 2022-10-30 MED ORDER — SODIUM CHLORIDE 0.9% FLUSH
10.0000 mL | Freq: Once | INTRAVENOUS | Status: AC
  Administered 2022-10-30: 10 mL via INTRAVENOUS

## 2022-10-30 MED ORDER — SODIUM CHLORIDE 0.9% FLUSH
10.0000 mL | INTRAVENOUS | Status: DC | PRN
  Administered 2022-10-30: 10 mL

## 2022-10-30 MED ORDER — HEPARIN SOD (PORK) LOCK FLUSH 100 UNIT/ML IV SOLN
500.0000 [IU] | Freq: Once | INTRAVENOUS | Status: AC | PRN
  Administered 2022-10-30: 500 [IU]

## 2022-10-30 MED ORDER — SODIUM CHLORIDE 0.9 % IV SOLN: Freq: Once | INTRAVENOUS | Status: AC

## 2022-10-30 MED ORDER — SODIUM CHLORIDE 0.9 % IV SOLN
74.5000 mg/m2 | Freq: Once | INTRAVENOUS | Status: AC
  Administered 2022-10-30: 150 mg via INTRAVENOUS
  Filled 2022-10-30: qty 15

## 2022-10-30 MED ORDER — PALONOSETRON HCL INJECTION 0.25 MG/5ML
0.2500 mg | Freq: Once | INTRAVENOUS | Status: AC
  Administered 2022-10-30: 0.25 mg via INTRAVENOUS
  Filled 2022-10-30: qty 5

## 2022-10-30 NOTE — Patient Instructions (Signed)
[facility] [place]  Discharge Instructions: Thank you for choosing [facility] to provide your oncology and hematology care.  If you have a lab appointment with the Cancer Center - please note that after April 8th, 2024, all labs will be drawn in the cancer center.  You do not have to check in or register with the main entrance as you have in the past but will complete your check-in in the cancer center.  Wear comfortable clothing and clothing appropriate for easy access to any Portacath or PICC line.   We strive to give you quality time with your provider. You may need to reschedule your appointment if you arrive late (15 or more minutes).  Arriving late affects you and other patients whose appointments are after yours.  Also, if you miss three or more appointments without notifying the office, you may be dismissed from the clinic at the provider's discretion.      For prescription refill requests, have your pharmacy contact our office and allow 72 hours for refills to be completed.    Today you received the following chemotherapy and/or immunotherapy agents: azacitidine      To help prevent nausea and vomiting after your treatment, we encourage you to take your nausea medication as directed.  BELOW ARE SYMPTOMS THAT SHOULD BE REPORTED IMMEDIATELY: *FEVER GREATER THAN 100.4 F (38 C) OR HIGHER *CHILLS OR SWEATING *NAUSEA AND VOMITING THAT IS NOT CONTROLLED WITH YOUR NAUSEA MEDICATION *UNUSUAL SHORTNESS OF BREATH *UNUSUAL BRUISING OR BLEEDING *URINARY PROBLEMS (pain or burning when urinating, or frequent urination) *BOWEL PROBLEMS (unusual diarrhea, constipation, pain near the anus) TENDERNESS IN MOUTH AND THROAT WITH OR WITHOUT PRESENCE OF ULCERS (sore throat, sores in mouth, or a toothache) UNUSUAL RASH, SWELLING OR PAIN  UNUSUAL VAGINAL DISCHARGE OR ITCHING   Items with * indicate a potential emergency and should be followed up as soon as possible or go to  the Emergency Department if any problems should occur.  Please show the CHEMOTHERAPY ALERT CARD or IMMUNOTHERAPY ALERT CARD at check-in to the Emergency Department and triage nurse.  Should you have questions after your visit or need to cancel or reschedule your appointment, please contact [facility] [phone]  and follow the prompts.  Office hours are 8:00 a.m. to 4:30 p.m. Monday - Friday. Please note that voicemails left after 4:00 p.m. may not be returned until the following business day.  We are closed weekends and major holidays. You have access to a nurse at all times for urgent questions. Please call the main number to the clinic [phone] and follow the prompts.  For any non-urgent questions, you may also contact your provider using MyChart. We now offer e-Visits for anyone 18 and older to request care online for non-urgent symptoms. For details visit [email or web address].   Also download the MyChart app! Go to the app store, search "MyChart", open the app, select [place], and log in with your MyChart username and password.   

## 2022-10-30 NOTE — Progress Notes (Signed)
MD aware of today's lab results including ANC 0.6 and Hgb 7.8. Per Dr Ellin Saba, ok to proceed with today's treatment.

## 2022-10-31 ENCOUNTER — Inpatient Hospital Stay: Payer: PPO

## 2022-10-31 VITALS — BP 121/73 | HR 58 | Temp 97.7°F | Resp 18

## 2022-10-31 DIAGNOSIS — D469 Myelodysplastic syndrome, unspecified: Secondary | ICD-10-CM

## 2022-10-31 DIAGNOSIS — Z5111 Encounter for antineoplastic chemotherapy: Secondary | ICD-10-CM | POA: Diagnosis not present

## 2022-10-31 MED ORDER — HEPARIN SOD (PORK) LOCK FLUSH 100 UNIT/ML IV SOLN
500.0000 [IU] | Freq: Once | INTRAVENOUS | Status: AC | PRN
  Administered 2022-10-31: 500 [IU]

## 2022-10-31 MED ORDER — SODIUM CHLORIDE 0.9 % IV SOLN: Freq: Once | INTRAVENOUS | Status: AC

## 2022-10-31 MED ORDER — SODIUM CHLORIDE 0.9% FLUSH: 10.0000 mL | INTRAVENOUS | Status: DC | PRN

## 2022-10-31 MED ORDER — SODIUM CHLORIDE 0.9 % IV SOLN
74.5000 mg/m2 | Freq: Once | INTRAVENOUS | Status: AC
  Administered 2022-10-31: 150 mg via INTRAVENOUS
  Filled 2022-10-31: qty 15

## 2022-10-31 NOTE — Progress Notes (Signed)
Patient presents today for chemotherapy infusion.  Patient is in satisfactory condition with no new complaints voiced.  Vital signs are stable.  Labs from 10/23/22 reviewed.  We will proceed with treatment per MD orders.    Patient tolerated treatment well with no complaints voiced.  Patient left ambulatory in stable condition.  Vital signs stable at discharge.  Follow up as scheduled.

## 2022-10-31 NOTE — Patient Instructions (Signed)
[facility]  Discharge Instructions: Thank you for choosing [facility] to provide your oncology and hematology care.  If you have a lab appointment with the Cancer Center - please note that after April 8th, 2024, all labs will be drawn in the cancer center.  You do not have to check in or register with the main entrance as you have in the past but will complete your check-in in the cancer center.  Wear comfortable clothing and clothing appropriate for easy access to any Portacath or PICC line.   We strive to give you quality time with your provider. You may need to reschedule your appointment if you arrive late (15 or more minutes).  Arriving late affects you and other patients whose appointments are after yours.  Also, if you miss three or more appointments without notifying the office, you may be dismissed from the clinic at the provider's discretion.      For prescription refill requests, have your pharmacy contact our office and allow 72 hours for refills to be completed.    Today you received the following chemotherapy and/or immunotherapy agents Vidaza.  Azacitidine Injection What is this medication? AZACITIDINE (ay za SITE i deen) treats blood and bone marrow cancers. It works by slowing down the growth of cancer cells. This medicine may be used for other purposes; ask your health care provider or pharmacist if you have questions. COMMON BRAND NAME(S): Vidaza What should I tell my care team before I take this medication? They need to know if you have any of these conditions: Kidney disease Liver disease Low blood cell levels, such as low white cells, platelets, or red blood cells Low levels of albumin in the blood Low levels of bicarbonate in the blood An unusual or allergic reaction to azacitidine, mannitol, other medications, foods, dyes, or preservatives If you or your partner are pregnant or trying to get pregnant Breast-feeding How should I  use this medication? This medication is injected into a vein or under the skin. It is given by your care team in a hospital or clinic setting. Talk to your care team about the use of this medication in children. While it may be prescribed for children as young as 1 month for selected conditions, precautions do apply. Overdosage: If you think you have taken too much of this medicine contact a poison control center or emergency room at once. NOTE: This medicine is only for you. Do not share this medicine with others. What if I miss a dose? Keep appointments for follow-up doses. It is important not to miss your dose. Call your care team if you are unable to keep an appointment. What may interact with this medication? Interactions are not expected. This list may not describe all possible interactions. Give your health care provider a list of all the medicines, herbs, non-prescription drugs, or dietary supplements you use. Also tell them if you smoke, drink alcohol, or use illegal drugs. Some items may interact with your medicine. What should I watch for while using this medication? Your condition will be monitored carefully while you are receiving this medication. You may need blood work while taking this medication. This medication may make you feel generally unwell. This is not uncommon as chemotherapy can affect healthy cells as well as cancer cells. Report any side effects. Continue your course of treatment even though you feel ill unless your care team tells you to stop. Other product types may be available that contain the   medication azacitidine. The injection and oral products should not be used in place of one another. Talk to your care team if you have questions. This medication can cause serious side effects. To reduce the risk, your care team may give you other medications to take before receiving this one. Be sure to follow the directions from your care team. This medication may increase your  risk of getting an infection. Call your care team for advice if you get a fever, chills, sore throat, or other symptoms of a cold or flu. Do not treat yourself. Try to avoid being around people who are sick. Avoid taking medications that contain aspirin, acetaminophen, ibuprofen, naproxen, or ketoprofen unless instructed by your care team. These medications may hide a fever. This medication may increase your risk to bruise or bleed. Call your care team if you notice any unusual bleeding. Be careful brushing or flossing your teeth or using a toothpick because you may get an infection or bleed more easily. If you have any dental work done, tell your dentist you are receiving this medication. Talk to your care team if you or your partner may be pregnant. Serious birth defects can occur if you take this medication during pregnancy and for 6 months after the last dose. You will need a negative pregnancy test before starting this medication. Contraception is recommended while taking his medication and for 6 months after the last dose. Your care team can help you find the option that works for you. If your partner can get pregnant, use a condom during sex while taking this medication and for 3 months after the last dose. Do not breastfeed while taking this medication and for 1 week after the last dose. This medication may cause infertility. Talk to your care team if you are concerned about your fertility. What side effects may I notice from receiving this medication? Side effects that you should report to your care team as soon as possible: Allergic reactions--skin rash, itching, hives, swelling of the face, lips, tongue, or throat Infection--fever, chills, cough, sore throat, wounds that don't heal, pain or trouble when passing urine, general feeling of discomfort or being unwell Kidney injury--decrease in the amount of urine, swelling of the ankles, hands, or feet Liver injury--right upper belly pain, loss  of appetite, nausea, light-colored stool, dark yellow or [place] urine, yellowing skin or eyes, unusual weakness or fatigue Low red blood cell level--unusual weakness or fatigue, dizziness, headache, trouble breathing Tumor lysis syndrome (TLS)--nausea, vomiting, diarrhea, decrease in the amount of urine, dark urine, unusual weakness or fatigue, confusion, muscle pain or cramps, fast or irregular heartbeat, joint pain Unusual bruising or bleeding Side effects that usually do not require medical attention (report to your care team if they continue or are bothersome): Constipation Diarrhea Nausea Pain, redness, or irritation at injection site Vomiting This list may not describe all possible side effects. Call your doctor for medical advice about side effects. You may report side effects to FDA at 1-800-FDA-1088. Where should I keep my medication? This medication is given in a hospital or clinic. It will not be stored at home. NOTE: This sheet is a summary. It may not cover all possible information. If you have questions about this medicine, talk to your doctor, pharmacist, or health care provider.  2024 Elsevier/Gold Standard (2021-09-22 00:00:00)        To help prevent nausea and vomiting after your treatment, we encourage you to take your nausea medication as directed.  BELOW ARE SYMPTOMS   THAT SHOULD BE REPORTED IMMEDIATELY: *FEVER GREATER THAN 100.4 F (38 C) OR HIGHER *CHILLS OR SWEATING *NAUSEA AND VOMITING THAT IS NOT CONTROLLED WITH YOUR NAUSEA MEDICATION *UNUSUAL SHORTNESS OF BREATH *UNUSUAL BRUISING OR BLEEDING *URINARY PROBLEMS (pain or burning when urinating, or frequent urination) *BOWEL PROBLEMS (unusual diarrhea, constipation, pain near the anus) TENDERNESS IN MOUTH AND THROAT WITH OR WITHOUT PRESENCE OF ULCERS (sore throat, sores in mouth, or a toothache) UNUSUAL RASH, SWELLING OR PAIN  UNUSUAL VAGINAL DISCHARGE OR ITCHING   Items with * indicate a potential emergency and  should be followed up as soon as possible or go to the Emergency Department if any problems should occur.  Please show the CHEMOTHERAPY ALERT CARD or IMMUNOTHERAPY ALERT CARD at check-in to the Emergency Department and triage nurse.  Should you have questions after your visit or need to cancel or reschedule your appointment, please contact [facility] [phone]  and follow the prompts.  Office hours are 8:00 a.m. to 4:30 p.m. Monday - Friday. Please note that voicemails left after 4:00 p.m. may not be returned until the following business day.  We are closed weekends and major holidays. You have access to a nurse at all times for urgent questions. Please call the main number to the clinic [phone] and follow the prompts.  For any non-urgent questions, you may also contact your provider using MyChart. We now offer e-Visits for anyone 18 and older to request care online for non-urgent symptoms. For details visit [email or web address].   Also download the MyChart app! Go to the app store, search "MyChart", open the app, select [facility], and log in with your MyChart username and password.   

## 2022-11-06 ENCOUNTER — Other Ambulatory Visit: Payer: Self-pay

## 2022-11-06 ENCOUNTER — Inpatient Hospital Stay: Payer: PPO | Admitting: Hematology

## 2022-11-06 ENCOUNTER — Inpatient Hospital Stay (HOSPITAL_COMMUNITY)
Admission: EM | Admit: 2022-11-06 | Discharge: 2022-11-09 | DRG: 690 | Disposition: A | Discharge status: Home / Self Care | Payer: PPO | Attending: Internal Medicine | Admitting: Internal Medicine

## 2022-11-06 ENCOUNTER — Emergency Department (HOSPITAL_COMMUNITY): Payer: PPO

## 2022-11-06 ENCOUNTER — Inpatient Hospital Stay: Payer: PPO

## 2022-11-06 ENCOUNTER — Encounter (HOSPITAL_COMMUNITY): Payer: Self-pay | Admitting: Emergency Medicine

## 2022-11-06 DIAGNOSIS — I482 Chronic atrial fibrillation, unspecified: Secondary | ICD-10-CM | POA: Diagnosis present

## 2022-11-06 DIAGNOSIS — E039 Hypothyroidism, unspecified: Secondary | ICD-10-CM | POA: Insufficient documentation

## 2022-11-06 DIAGNOSIS — E872 Acidosis, unspecified: Secondary | ICD-10-CM | POA: Diagnosis not present

## 2022-11-06 DIAGNOSIS — N400 Enlarged prostate without lower urinary tract symptoms: Secondary | ICD-10-CM | POA: Diagnosis not present

## 2022-11-06 DIAGNOSIS — K529 Noninfective gastroenteritis and colitis, unspecified: Secondary | ICD-10-CM | POA: Diagnosis present

## 2022-11-06 DIAGNOSIS — D696 Thrombocytopenia, unspecified: Secondary | ICD-10-CM

## 2022-11-06 DIAGNOSIS — D539 Nutritional anemia, unspecified: Secondary | ICD-10-CM | POA: Diagnosis not present

## 2022-11-06 DIAGNOSIS — E876 Hypokalemia: Secondary | ICD-10-CM | POA: Insufficient documentation

## 2022-11-06 DIAGNOSIS — E8809 Other disorders of plasma-protein metabolism, not elsewhere classified: Secondary | ICD-10-CM | POA: Diagnosis present

## 2022-11-06 DIAGNOSIS — F419 Anxiety disorder, unspecified: Secondary | ICD-10-CM | POA: Diagnosis present

## 2022-11-06 DIAGNOSIS — F32A Depression, unspecified: Secondary | ICD-10-CM | POA: Diagnosis present

## 2022-11-06 DIAGNOSIS — N390 Urinary tract infection, site not specified: Principal | ICD-10-CM | POA: Diagnosis present

## 2022-11-06 DIAGNOSIS — N183 Chronic kidney disease, stage 3 unspecified: Secondary | ICD-10-CM | POA: Diagnosis not present

## 2022-11-06 DIAGNOSIS — R9431 Abnormal electrocardiogram [ECG] [EKG]: Secondary | ICD-10-CM

## 2022-11-06 DIAGNOSIS — I2489 Other forms of acute ischemic heart disease: Secondary | ICD-10-CM | POA: Diagnosis present

## 2022-11-06 DIAGNOSIS — A419 Sepsis, unspecified organism: Secondary | ICD-10-CM | POA: Diagnosis not present

## 2022-11-06 DIAGNOSIS — D72819 Decreased white blood cell count, unspecified: Secondary | ICD-10-CM

## 2022-11-06 DIAGNOSIS — F17210 Nicotine dependence, cigarettes, uncomplicated: Secondary | ICD-10-CM | POA: Diagnosis present

## 2022-11-06 DIAGNOSIS — I700 Atherosclerosis of aorta: Secondary | ICD-10-CM | POA: Diagnosis not present

## 2022-11-06 DIAGNOSIS — N281 Cyst of kidney, acquired: Secondary | ICD-10-CM | POA: Diagnosis not present

## 2022-11-06 DIAGNOSIS — I714 Abdominal aortic aneurysm, without rupture, unspecified: Secondary | ICD-10-CM | POA: Diagnosis present

## 2022-11-06 DIAGNOSIS — N429 Disorder of prostate, unspecified: Secondary | ICD-10-CM | POA: Diagnosis present

## 2022-11-06 DIAGNOSIS — Z9221 Personal history of antineoplastic chemotherapy: Secondary | ICD-10-CM

## 2022-11-06 DIAGNOSIS — D469 Myelodysplastic syndrome, unspecified: Secondary | ICD-10-CM | POA: Diagnosis present

## 2022-11-06 DIAGNOSIS — I130 Hypertensive heart and chronic kidney disease with heart failure and stage 1 through stage 4 chronic kidney disease, or unspecified chronic kidney disease: Secondary | ICD-10-CM | POA: Diagnosis not present

## 2022-11-06 DIAGNOSIS — R197 Diarrhea, unspecified: Secondary | ICD-10-CM | POA: Insufficient documentation

## 2022-11-06 DIAGNOSIS — R6520 Severe sepsis without septic shock: Secondary | ICD-10-CM | POA: Diagnosis not present

## 2022-11-06 DIAGNOSIS — N179 Acute kidney failure, unspecified: Secondary | ICD-10-CM | POA: Diagnosis not present

## 2022-11-06 DIAGNOSIS — R7989 Other specified abnormal findings of blood chemistry: Secondary | ICD-10-CM | POA: Diagnosis not present

## 2022-11-06 DIAGNOSIS — I252 Old myocardial infarction: Secondary | ICD-10-CM | POA: Diagnosis not present

## 2022-11-06 DIAGNOSIS — D61818 Other pancytopenia: Secondary | ICD-10-CM | POA: Diagnosis not present

## 2022-11-06 DIAGNOSIS — I7143 Infrarenal abdominal aortic aneurysm, without rupture: Secondary | ICD-10-CM

## 2022-11-06 DIAGNOSIS — N4289 Other specified disorders of prostate: Secondary | ICD-10-CM | POA: Insufficient documentation

## 2022-11-06 DIAGNOSIS — I5042 Chronic combined systolic (congestive) and diastolic (congestive) heart failure: Secondary | ICD-10-CM | POA: Insufficient documentation

## 2022-11-06 DIAGNOSIS — Z7982 Long term (current) use of aspirin: Secondary | ICD-10-CM

## 2022-11-06 DIAGNOSIS — Z79890 Hormone replacement therapy (postmenopausal): Secondary | ICD-10-CM

## 2022-11-06 DIAGNOSIS — I959 Hypotension, unspecified: Secondary | ICD-10-CM | POA: Diagnosis not present

## 2022-11-06 DIAGNOSIS — R059 Cough, unspecified: Secondary | ICD-10-CM | POA: Diagnosis not present

## 2022-11-06 DIAGNOSIS — D638 Anemia in other chronic diseases classified elsewhere: Secondary | ICD-10-CM | POA: Diagnosis present

## 2022-11-06 DIAGNOSIS — K8020 Calculus of gallbladder without cholecystitis without obstruction: Secondary | ICD-10-CM | POA: Diagnosis not present

## 2022-11-06 DIAGNOSIS — Z79899 Other long term (current) drug therapy: Secondary | ICD-10-CM

## 2022-11-06 DIAGNOSIS — T451X5A Adverse effect of antineoplastic and immunosuppressive drugs, initial encounter: Secondary | ICD-10-CM | POA: Diagnosis present

## 2022-11-06 DIAGNOSIS — N3000 Acute cystitis without hematuria: Secondary | ICD-10-CM

## 2022-11-06 DIAGNOSIS — B9620 Unspecified Escherichia coli [E. coli] as the cause of diseases classified elsewhere: Secondary | ICD-10-CM | POA: Diagnosis not present

## 2022-11-06 DIAGNOSIS — Z885 Allergy status to narcotic agent status: Secondary | ICD-10-CM

## 2022-11-06 LAB — COMPREHENSIVE METABOLIC PANEL
ALT: 15 U/L (ref 0–44)
AST: 24 U/L (ref 15–41)
Albumin: 3.2 g/dL — ABNORMAL LOW (ref 3.5–5.0)
Alkaline Phosphatase: 55 U/L (ref 38–126)
Anion gap: 14 (ref 5–15)
BUN: 63 mg/dL — ABNORMAL HIGH (ref 8–23)
CO2: 18 mmol/L — ABNORMAL LOW (ref 22–32)
Calcium: 9 mg/dL (ref 8.9–10.3)
Chloride: 104 mmol/L (ref 98–111)
Creatinine, Ser: 2.61 mg/dL — ABNORMAL HIGH (ref 0.61–1.24)
GFR, Estimated: 26 mL/min — ABNORMAL LOW (ref >60)
Glucose, Bld: 112 mg/dL — ABNORMAL HIGH (ref 70–99)
Potassium: 2.7 mmol/L — CL (ref 3.5–5.1)
Sodium: 136 mmol/L (ref 135–145)
Total Bilirubin: 1.2 mg/dL (ref 0.3–1.2)
Total Protein: 7.3 g/dL (ref 6.5–8.1)

## 2022-11-06 LAB — TROPONIN I (HIGH SENSITIVITY)
Troponin I (High Sensitivity): 30 ng/L — ABNORMAL HIGH (ref <18)
Troponin I (High Sensitivity): 26 ng/L — ABNORMAL HIGH (ref <18)

## 2022-11-06 LAB — URINALYSIS, ROUTINE W REFLEX MICROSCOPIC
Bilirubin Urine: NEGATIVE
Glucose, UA: NEGATIVE mg/dL
Ketones, ur: NEGATIVE mg/dL
Nitrite: NEGATIVE
Protein, ur: 100 mg/dL — AB
Specific Gravity, Urine: 1.011 (ref 1.005–1.030)
WBC, UA: >50 WBC/hpf (ref 0–5)
pH: 5 (ref 5.0–8.0)

## 2022-11-06 LAB — CBC
HCT: 21.6 % — ABNORMAL LOW (ref 39.0–52.0)
Hemoglobin: 7.2 g/dL — ABNORMAL LOW (ref 13.0–17.0)
MCH: 35.8 pg — ABNORMAL HIGH (ref 26.0–34.0)
MCHC: 33.3 g/dL (ref 30.0–36.0)
MCV: 107.5 fL — ABNORMAL HIGH (ref 80.0–100.0)
Platelets: 102 10*3/uL — ABNORMAL LOW (ref 150–400)
RBC: 2.01 MIL/uL — ABNORMAL LOW (ref 4.22–5.81)
RDW: 14.6 % (ref 11.5–15.5)
WBC: 2.1 10*3/uL — ABNORMAL LOW (ref 4.0–10.5)
nRBC: 0 % (ref 0.0–0.2)

## 2022-11-06 LAB — LACTIC ACID, PLASMA
Lactic Acid, Venous: 2.3 mmol/L (ref 0.5–1.9)
Lactic Acid, Venous: 1.2 mmol/L (ref 0.5–1.9)

## 2022-11-06 LAB — MAGNESIUM: Magnesium: 2 mg/dL (ref 1.7–2.4)

## 2022-11-06 LAB — C DIFFICILE QUICK SCREEN W PCR REFLEX
C Diff antigen: NEGATIVE
C Diff interpretation: NOT DETECTED
C Diff toxin: NEGATIVE

## 2022-11-06 LAB — PROTIME-INR
INR: 1.3 — ABNORMAL HIGH (ref 0.8–1.2)
Prothrombin Time: 16 seconds — ABNORMAL HIGH (ref 11.4–15.2)

## 2022-11-06 LAB — LIPASE, BLOOD: Lipase: 30 U/L (ref 11–51)

## 2022-11-06 MED ORDER — ACETAMINOPHEN 325 MG PO TABS
650.0000 mg | ORAL_TABLET | Freq: Once | ORAL | Status: AC
  Administered 2022-11-06: 650 mg via ORAL
  Filled 2022-11-06: qty 2

## 2022-11-06 MED ORDER — ENSURE ENLIVE PO LIQD
237.0000 mL | Freq: Two times a day (BID) | ORAL | Status: DC
  Administered 2022-11-08 – 2022-11-09 (×3): 237 mL via ORAL

## 2022-11-06 MED ORDER — PROCHLORPERAZINE EDISYLATE 10 MG/2ML IJ SOLN: 10.0000 mg | Freq: Four times a day (QID) | INTRAMUSCULAR | Status: DC | PRN

## 2022-11-06 MED ORDER — SODIUM CHLORIDE 0.9 % IV BOLUS
1000.0000 mL | Freq: Once | INTRAVENOUS | Status: AC
  Administered 2022-11-06: 1000 mL via INTRAVENOUS

## 2022-11-06 MED ORDER — ENOXAPARIN SODIUM 30 MG/0.3ML IJ SOSY
30.0000 mg | PREFILLED_SYRINGE | INTRAMUSCULAR | Status: DC
  Administered 2022-11-06: 30 mg via SUBCUTANEOUS
  Filled 2022-11-06: qty 0.3

## 2022-11-06 MED ORDER — POTASSIUM CHLORIDE CRYS ER 20 MEQ PO TBCR
40.0000 meq | EXTENDED_RELEASE_TABLET | Freq: Once | ORAL | Status: AC
  Administered 2022-11-06: 40 meq via ORAL
  Filled 2022-11-06: qty 2

## 2022-11-06 MED ORDER — ACETAMINOPHEN 650 MG RE SUPP: 650.0000 mg | Freq: Four times a day (QID) | RECTAL | Status: DC | PRN

## 2022-11-06 MED ORDER — ACETAMINOPHEN 325 MG PO TABS: 650.0000 mg | ORAL_TABLET | Freq: Four times a day (QID) | ORAL | Status: DC | PRN

## 2022-11-06 MED ORDER — SODIUM CHLORIDE 0.9 % IV SOLN
2.0000 g | INTRAVENOUS | Status: DC
  Administered 2022-11-06 – 2022-11-08 (×3): 2 g via INTRAVENOUS
  Filled 2022-11-06 (×3): qty 12.5

## 2022-11-06 MED ORDER — LOPERAMIDE HCL 2 MG PO CAPS
2.0000 mg | ORAL_CAPSULE | Freq: Once | ORAL | Status: AC
  Administered 2022-11-06: 2 mg via ORAL
  Filled 2022-11-06: qty 1

## 2022-11-06 MED ORDER — SODIUM CHLORIDE 0.9 % IV SOLN
2.0000 g | Freq: Once | INTRAVENOUS | Status: AC
  Administered 2022-11-06: 2 g via INTRAVENOUS
  Filled 2022-11-06: qty 20

## 2022-11-06 MED ORDER — POTASSIUM CHLORIDE 10 MEQ/100ML IV SOLN
10.0000 meq | Freq: Once | INTRAVENOUS | Status: AC
  Administered 2022-11-06: 10 meq via INTRAVENOUS
  Filled 2022-11-06: qty 100

## 2022-11-06 NOTE — ED Triage Notes (Signed)
Pt via POV c/o diarrhea and weakness with periods of dizziness x 3 days. Pt is a cancer pt w leukemia and states his diarrhea has been clear with estimated 15+ episodes in the past 24 hours. Tachycardia and hypotension noted in triage. Last chemo treatment was a week ago

## 2022-11-06 NOTE — ED Notes (Signed)
Pt transported to restroom per request prior to obtaining EKG

## 2022-11-06 NOTE — Progress Notes (Signed)
Pharmacy Antibiotic Note  John Hicks is a 72 y.o. male admitted on 11/06/2022 with UTI.  Pharmacy has been consulted for cefepime dosing.  Today, 11/06/22 WBC low, s/p chemotherapy AKI - SCr 2.61, CrCl 29 mL/min  Plan: Cefepime 2 g IV q24h Monitor renal function, culture data  Height: 6' (182.9 cm) Weight: 83.9 kg (185 lb) IBW/kg (Calculated) : 77.6  Temp (24hrs), Avg:98.6 F (37 C), Min:98.2 F (36.8 C), Max:99.2 F (37.3 C)  Recent Labs  Lab 11/06/22 1530 11/06/22 1608 11/06/22 1815  WBC 2.1*  --   --   CREATININE 2.61*  --   --   LATICACIDVEN  --  2.3* 1.2    Estimated Creatinine Clearance: 28.9 mL/min (A) (by C-G formula based on SCr of 2.61 mg/dL (H)).    Allergies  Allergen Reactions   Codeine Other (See Comments)    agitation    Antimicrobials this admission: Cefepime 6/17 >>  Dose adjustments this admission:  Microbiology results: 6/17 BCx: 6/17 C.diff:   Cindi Carbon, PharmD 11/06/2022 9:44 PM

## 2022-11-06 NOTE — ED Notes (Signed)
Pt on bedside commode.

## 2022-11-06 NOTE — H&P (Signed)
History and Physical    Patient: John Hicks:[id number] DOB: [birth year] DOA: 11/06/2022 DOS: the patient was seen and examined on 11/06/2022 PCP: Elfredia Nevins, MD  Patient coming from: Home  Chief Complaint:  Chief Complaint  Patient presents with   Diarrhea   HPI: John Hicks is a 72 y.o. male with medical history significant of , chronic combined HFpEF and HFrEF (LVEF 45 to 50%, LV with global hypokinesis, moderate LVH, G1 DD-06/2022), MDS, atrial fibrillation, infrarenal AAA status post endovascular stenting in 2020, essential hypertension who presents to the emergency department due to 3-day onset of diarrhea associated with weakness.  He complained of 15+ episodes of clear loose bowel movements within the last 24 hours.  Patient also complained of pain and discomfort on urination which also started about 3 days ago.  Patient denies fever, chills, nausea, vomiting, blood in stool, sick contacts.  Last oncology visit was 10/31/2022.  ED Course:  In the emergency department, she was tachycardic with a heart rate of 101 bpm, BP was 122/107, other vital signs were within normal range.  Workup in the ED showed WBC 2.1, H/H 7.2/21.6, platelets 102, MCV 107.5.  BMP showed sodium 136, potassium 2.7, chloride 104, bicarb 18, blood glucose 112, BUNs/creatinine 63/2.61, albumin 3.2.  Lactic acid 2.3 > 1.2, troponin 30 > 26, magnesium 2.0, lipase 30. Chest x-ray showed no active disease CT abdomen and pelvis without contrast was suggestive of stercoral colitis.  Bladder wall is thickened and trabeculated and there is enlarged prostate with mass effect along the base of the bladder.  AAA 6.5 cm, similar to previous. Patient was empirically treated with IV ceftriaxone for UTI, Tylenol was given, potassium was replenished, Imodium was given, IV hydration was provided.  Hospitalist was asked to admit patient for further evaluation and management.  Review of Systems: Review of systems as noted in  the HPI. All other systems reviewed and are negative.   Past Medical History:  Diagnosis Date   AAA (abdominal aortic aneurysm) (HCC)    AF (atrial fibrillation) (HCC)    Anxiety    BPH (benign prostatic hyperplasia)    CHF (congestive heart failure) (HCC)    Chronic kidney disease    stage 3   Depression    Dyspnea    Dysrhythmia    Hypertension    Hypothyroid    Myocardial infarction [facility])    Past Surgical History:  Procedure Laterality Date   ABDOMINAL AORTIC ENDOVASCULAR STENT GRAFT N/A 02/03/2019   Procedure: ABDOMINAL AORTIC ENDOVASCULAR STENT GRAFT;  Surgeon: Maeola Harman, MD;  Location: [facility] OR;  Service: Vascular;  Laterality: N/A;   APPENDECTOMY     KNEE SURGERY     PORTACATH PLACEMENT Right 10/12/2022   Procedure: INSERTION PORT-A-CATH;  Surgeon: Lucretia Roers, MD;  Location: [facility] ORS;  Service: General;  Laterality: Right;   ULTRASOUND GUIDANCE FOR VASCULAR ACCESS  02/03/2019   Procedure: Ultrasound Guidance For Vascular Access;  Surgeon: Maeola Harman, MD;  Location: [facility] OR;  Service: Vascular;;    Social History:  reports that he has been smoking cigarettes. He has been smoking an average of 1 pack per day. He has been exposed to tobacco smoke. He has never used smokeless tobacco. He reports current drug use. Drug: Marijuana. He reports that he does not drink alcohol.   Allergies  Allergen Reactions   Codeine Other (See Comments)    agitation    Family History  Problem Relation Age of Onset  Stroke Father 22       went in the bathroom and collapsed   Multiple sclerosis Sister    Psychiatric Illness Mother 27     Prior to Admission medications   Medication Sig Start Date End Date Taking? Authorizing Provider  ALPRAZolam Prudy Feeler) 1 MG tablet Take 1 mg by mouth 3 (three) times daily as needed for anxiety. 07/06/20   [provider]  amiodarone (PACERONE) 200 MG tablet Take 200 mg by mouth daily at 2 PM. 08/23/16   [provider]  amoxicillin (AMOXIL) 875 MG tablet Take 875 mg by mouth 2 (two) times daily. X10 days 09/15/22   [provider]  aspirin 81 MG chewable tablet Chew 81 mg by mouth daily at 2 PM.    [provider]  azaCITIDine 5 mg/2 mLs in lactated ringers infusion Inject into the vein every 28 (twenty-eight) days. Days 1-7 every 28 days 10/02/22   [provider]  carvedilol (COREG) 6.25 MG tablet Take 1 tablet (6.25 mg total) by mouth 2 (two) times daily. 09/13/22   Rollene Rotunda, MD  Cholecalciferol (VITAMIN D3) 50 MCG (2000 UT) capsule Take 2,000 Units by mouth daily at 2 PM. 1400    [provider]  Coenzyme Q10 (COQ10) 200 MG CAPS Take 200 mg by mouth daily at 2 PM.    [provider]  Flaxseed, Linseed, (FLAXSEED OIL) 1000 MG CAPS Take 1,000 mg by mouth daily at 2 PM.    [provider]  folic acid (FOLVITE) 1 MG tablet Take 1 mg by mouth daily at 2 PM. One daily    [provider]  furosemide (LASIX) 20 MG tablet Take 40 mg by mouth 2 (two) times daily. 1400 & bedtime    [provider]  HYDROcodone-acetaminophen (NORCO) 5-325 MG tablet Take 1 tablet by mouth every 6 (six) hours as needed for severe pain. 10/12/22   Lucretia Roers, MD  levothyroxine (SYNTHROID) 25 MCG tablet Take 25 mcg by mouth daily at 2 PM. 03/29/21   [provider]  lidocaine-prilocaine (EMLA) cream Apply 1 Application topically as needed (Apply to port site 1 hour prior to access). 10/10/22   Doreatha Massed, MD  ondansetron (ZOFRAN) 4 MG tablet Take 1 tablet (4 mg total) by mouth every 8 (eight) hours as needed. 10/12/22 10/12/23  Lucretia Roers, MD  prochlorperazine (COMPAZINE) 10 MG tablet Take 1 tablet (10 mg total) by mouth every 6 (six) hours as needed for nausea or vomiting. 10/10/22   Doreatha Massed, MD  tamsulosin (FLOMAX) 0.4 MG CAPS capsule Take 0.4 mg by mouth daily at 2 PM. 03/28/21   [provider]     Physical Exam: BP 98/61   Pulse (!) 59   Temp 98.5 F (36.9 C) (Oral)   Resp 13   Ht 6' (1.829 m)   Wt 83.9 kg   SpO2 98%   BMI 25.09 kg/m   General: 72 y.o. year-old male well developed well nourished in no acute distress.  Alert and oriented x3. HEENT: NCAT, EOMI Neck: Supple, trachea medial Cardiovascular: Regular rate and rhythm with no rubs or gallops.  No thyromegaly or JVD noted.  No lower extremity edema. 2/4 pulses in all 4 extremities. Respiratory: Clear to auscultation with no wheezes or rales. Good inspiratory effort. Abdomen: Soft, nontender nondistended with normal bowel sounds x4 quadrants. Muskuloskeletal: No cyanosis, clubbing or edema noted bilaterally Neuro: CN II-XII intact, strength 5/5 x 4, sensation, reflexes intact Skin:  No ulcerative lesions noted or rashes Psychiatry: Judgement and insight appear normal. Mood is appropriate for condition and setting          Labs on Admission:  Basic Metabolic Panel: Recent Labs  Lab 11/06/22 1530 11/06/22 1815  NA 136  --   K 2.7*  --   CL 104  --   CO2 18*  --   GLUCOSE 112*  --   BUN 63*  --   CREATININE 2.61*  --   CALCIUM 9.0  --   MG  --  2.0   Liver Function Tests: Recent Labs  Lab 11/06/22 1530  AST 24  ALT 15  ALKPHOS 55  BILITOT 1.2  PROT 7.3  ALBUMIN 3.2*   Recent Labs  Lab 11/06/22 1530  LIPASE 30   No results for input(s): "AMMONIA" in the last 168 hours. CBC: Recent Labs  Lab 11/06/22 1530  WBC 2.1*  HGB 7.2*  HCT 21.6*  MCV 107.5*  PLT 102*   Cardiac Enzymes: No results for input(s): "CKTOTAL", "CKMB", "CKMBINDEX", "TROPONINI" in the last 168 hours.  BNP (last 3 results) No results for input(s): "BNP" in the last 8760 hours.  ProBNP (last 3 results) No results for input(s): "PROBNP" in the last 8760 hours.  CBG: No results for input(s): "GLUCAP" in the last 168 hours.  Radiological Exams on Admission: DG Chest Portable 1 View  Result Date:  11/06/2022 CLINICAL DATA:  Cough. EXAM: PORTABLE CHEST 1 VIEW COMPARISON:  Chest radiograph dated 10/12/2022. FINDINGS: Right-sided Port-A-Cath in similar position. No focal consolidation, pleural effusion, or pneumothorax. Stable cardiomegaly. Atherosclerotic calcification of the aortic arch. No acute osseous pathology. IMPRESSION: 1. No active disease. 2. Cardiomegaly. Electronically Signed   By: Elgie Collard M.D.   On: 11/06/2022 19:34   CT ABDOMEN PELVIS WO CONTRAST  Result Date: 11/06/2022 CLINICAL DATA:  Diarrhea, lower abdominal pain. Hypotension with sepsis. Patient has leukemia. EXAM: CT ABDOMEN AND PELVIS WITHOUT CONTRAST TECHNIQUE: Multidetector CT imaging of the abdomen and pelvis was performed following the standard protocol without IV contrast. RADIATION DOSE REDUCTION: This exam was performed according to the departmental dose-optimization program which includes automated exposure control, adjustment of the mA and/or kV according to patient size and/or use of iterative reconstruction technique. COMPARISON:  CT 08/23/2022 without contrast FINDINGS: Lower chest: Heart is enlarged. Coronary artery calcifications are seen. There is some linear opacity at the lung bases likely scar or atelectasis. Tiny left effusion. Mildly patulous esophagus. Hepatobiliary: Stones in the gallbladder. On this non IV contrast exam, the liver parenchyma is grossly preserved. Pancreas: Mild pancreatic atrophy. Spleen: Spleen is enlarged with cephalocaudal length of 13.9 cm. Spleen is slightly heterogeneous. Adrenals/Urinary Tract: Slight nonspecific nodular thickening of the adrenal glands. Focal area on the right side measures 12 mm on series 3, image 22 measuring Hounsfield unit of 4, a benign adenoma. Numerous bilateral small renal cysts are identified again Bosniak 1 and 2 lesions. No specific imaging follow-up. No obstructing renal or ureteral stones. Slight ectasia of the renal collecting systems and ureters  down to the bladder. The bladder wall is diffusely slightly thickened with some trabeculation and there is an enlarged prostate. Stomach/Bowel: No oral contrast. Stomach is underdistended. Small bowel is nondilated. Slight wall thickening along the sigmoid colon but the bowel is underdistended. Slight adjacent stranding. Please correlate for any clinical evidence of colitis. No proximal obstruction. Appendix is poorly seen in the right lower quadrant but no pericecal stranding or fluid. Vascular/Lymphatic: Aortic endograft  in place, incompletely evaluated on this noncontrast examination. Diameter of the aneurysm previously was measured at 6.5 cm and today when measured in the same fashion as the prior measures 6.5 cm. Dedicated workup as clinically appropriate. Reproductive: Enlarged prostate with mass effect along the base of the bladder. Other: No free intra-air.  Small fat containing inguinal hernias. Musculoskeletal: Curvature of the spine with degenerative changes. Multilevel stenosis. IMPRESSION: Subtle wall thickening along the sigmoid colon with stranding. A subtle colitis is possible. No obstruction or free air. No free fluid. Ectatic renal collecting systems dome of the bladder. The bladder wall is thickened and trabeculated and there is a large prostate with mass effect along the base of the bladder. No obstructing stones. Enlarged heart. Gallstones.  Gallbladder is nondilated. Splenomegaly. Aortic endograft with the abdominal aortic aneurysm 6.5 cm, similar to previous. Overall the endograft in the aneurysm is incompletely evaluated on this noncontrast exam. If needed dedicated endograft protocol postcontrast study could be considered if needed clinically Electronically Signed   By: Karen Kays M.D.   On: 11/06/2022 19:31    EKG: I independently viewed the EKG done and my findings are as followed:    Assessment/Plan Present on Admission:  UTI (urinary tract infection)  AAA (abdominal aortic  aneurysm) (HCC)  Principal Problem:   UTI (urinary tract infection) Active Problems:   AAA (abdominal aortic aneurysm) (HCC)   Lactic acidosis   Acute diarrhea   Elevated troponin   Macrocytic anemia   Leukopenia   Thrombocytopenia (HCC)   Hypokalemia   Prolonged QT interval   Prostatic mass   Atrial fibrillation, chronic (HCC)   Acquired hypothyroidism   Chronic combined systolic and diastolic CHF (congestive heart failure) (HCC)   UTI POA Patient was empirically started on IV ceftriaxone Urine culture done on 10/21/2021 was positive for Enterobacter cloacae which was sensitive to cefepime We shall continue with IV cefepime Urine culture pending  Lactic acidosis-resolved  Acute diarrhea Correct electrolyte abnormalities C. difficile and GI stool panel pending  Elevated troponin possibly secondary to type II demand ischemia Troponin 30 > 26.  Troponin already flattened.  Patient denies chest pain  Leukopenia possibly due to MDS vs UTI WBC 2.1 (baseline 3 within 1.8-2.2 in the last 3 months) Continue to monitor WBC with morning labs  Chronic thrombocytopenia Platelets 102, stable, continue to monitor platelet levels with morning labs  Macrocytic anemia MCV was 107.5, vitamin B12 and folate will be checked  Hypokalemia K+ 2.7, this will be replenished  Prolonged QT interval QTc 542 milliseconds Avoid QT prolonging drugs Magnesium 2.0 K+ 2.7, this will be replenished Repeat EKG in the morning  Abdominal aortic aneurysm CT abdomen and pelvis showed  AAA 6.5 cm, similar to previous  Prostatic mass CT abdomen and pelvis with contrast showed enlarged prostate with mass effect along the base of the bladder.  Urology will be consulted and we shall await further recommendations  Hypoalbuminemia Albumin 3.2, protein supplement will be provided  Atrial fibrillation Continue amiodarone Patient was not on any anticoagulant  Essential hypertension BP meds held at  this time due to soft BP  Acquired hypothyroidism Continue Synthroid  Chronic combined HFpEF and HFrEF LVEF 45 to 50%, LV with global hypokinesis, moderate LVH, G1 DD-06/2022 Continue aspirin Lasix and Coreg held at this time due to soft BP  DVT prophylaxis: Lovenox  Advance Care Planning: Full code  Consults: Urology  Family Communication: None at bedside  Severity of Illness: The appropriate patient status for  this patient is INPATIENT. Inpatient status is judged to be reasonable and necessary in order to provide the required intensity of service to ensure the patient's safety. The patient's presenting symptoms, physical exam findings, and initial radiographic and laboratory data in the context of their chronic comorbidities is felt to place them at high risk for further clinical deterioration. Furthermore, it is not anticipated that the patient will be medically stable for discharge from the hospital within 2 midnights of admission.   * I certify that at the point of admission it is my clinical judgment that the patient will require inpatient hospital care spanning beyond 2 midnights from the point of admission due to high intensity of service, high risk for further deterioration and high frequency of surveillance required.*  Author: Frankey Shown, DO 11/06/2022 10:27 PM  For on call review [email or web address].

## 2022-11-06 NOTE — ED Notes (Signed)
Provider at bedside

## 2022-11-06 NOTE — ED Notes (Signed)
Notified EDP of pt presentation and sepsis criteria

## 2022-11-06 NOTE — ED Provider Notes (Incomplete)
[facility] EM[facility]GENCY DEPARTMENT AT [facility] Provider Note   CSN: [id number] Arrival date & time: 11/06/22  1440     History {Add pertinent medical, surgical, social history, OB history to HPI:1} Chief Complaint  Patient presents with   Diarrhea    John Hicks is a 72 y.o. male.  Patient is a 72 year old male with past medical history of myelodysplastic syndrome, severe aortic root dilation diagnosed in 2024, abdominal aortic aneurysm, A-fib on amiodarone, CHF with EF of 40 to 45% 2024, hypertension, hypothyroidism, and previous myocardial infarction presenting for complaints of diarrhea.  Patient admits to severe diarrhea over the last 3 days with associated lower abdominal pain.  Patient admits to watery like diarrhea with 15 episodes in the past 24 hours.  Admits to associated weakness. patient states in the last 3 days he has had applesauce only.  He denies any nausea or vomiting.  He denies any fevers or chills.  Denies sick contacts.  Denies any suspicious food intake.  Denies any hematochezia.  Last chemotherapy treatment 1 week ago.  Patient presented with tachycardia of 101, hypotension of 84/52, and temperature of 99.2 F triggering sepsis criteria.  The history is provided by the patient. No language interpreter was used.  Diarrhea Associated symptoms: abdominal pain   Associated symptoms: no arthralgias, no chills, no fever and no vomiting        Home Medications Prior to Admission medications   Medication Sig Start Date End Date Taking? Authorizing Provider  ALPRAZolam Prudy Feeler) 1 MG tablet Take 1 mg by mouth 3 (three) times daily as needed for anxiety. 07/06/20   [provider]  amiodarone (PACERONE) 200 MG tablet Take 200 mg by mouth daily at 2 PM. 08/23/16   [provider]  amoxicillin (AMOXIL) 875 MG tablet Take 875 mg by mouth 2 (two) times daily. X10 days 09/15/22   [provider]  aspirin 81 MG chewable tablet Chew 81 mg by  mouth daily at 2 PM.    [provider]  azaCITIDine 5 mg/2 mLs in lactated ringers infusion Inject into the vein every 28 (twenty-eight) days. Days 1-7 every 28 days 10/02/22   [provider]  carvedilol (COREG) 6.25 MG tablet Take 1 tablet (6.25 mg total) by mouth 2 (two) times daily. 09/13/22   Rollene Rotunda, MD  Cholecalciferol (VITAMIN D3) 50 MCG (2000 UT) capsule Take 2,000 Units by mouth daily at 2 PM. 1400    [provider]  Coenzyme Q10 (COQ10) 200 MG CAPS Take 200 mg by mouth daily at 2 PM.    [provider]  Flaxseed, Linseed, (FLAXSEED OIL) 1000 MG CAPS Take 1,000 mg by mouth daily at 2 PM.    [provider]  folic acid (FOLVITE) 1 MG tablet Take 1 mg by mouth daily at 2 PM. One daily    [provider]  furosemide (LASIX) 20 MG tablet Take 40 mg by mouth 2 (two) times daily. 1400 & bedtime    [provider]  HYDROcodone-acetaminophen (NORCO) 5-325 MG tablet Take 1 tablet by mouth every 6 (six) hours as needed for severe pain. 10/12/22   Lucretia Roers, MD  levothyroxine (SYNTHROID) 25 MCG tablet Take 25 mcg by mouth daily at 2 PM. 03/29/21   [provider]  lidocaine-prilocaine (EMLA) cream Apply 1 Application topically as needed (Apply to port site 1 hour prior to access). 10/10/22   Doreatha Massed, MD  ondansetron (ZOFRAN) 4 MG tablet Take 1  tablet (4 mg total) by mouth every 8 (eight) hours as needed. 10/12/22 10/12/23  Lucretia Roers, MD  prochlorperazine (COMPAZINE) 10 MG tablet Take 1 tablet (10 mg total) by mouth every 6 (six) hours as needed for nausea or vomiting. 10/10/22   Doreatha Massed, MD  tamsulosin (FLOMAX) 0.4 MG CAPS capsule Take 0.4 mg by mouth daily at 2 PM. 03/28/21   [provider]      Allergies    Codeine    Review of Systems   Review of Systems  Constitutional:  Negative for chills and fever.  HENT:  Negative for ear pain and sore throat.   Eyes:  Negative  for pain and visual disturbance.  Respiratory:  Negative for cough and shortness of breath.   Cardiovascular:  Negative for chest pain and palpitations.  Gastrointestinal:  Positive for abdominal pain and diarrhea. Negative for blood in stool, constipation, nausea and vomiting.  Genitourinary:  Negative for dysuria and hematuria.  Musculoskeletal:  Negative for arthralgias and back pain.  Skin:  Negative for color change and rash.  Neurological:  Positive for weakness. Negative for seizures and syncope.  All other systems reviewed and are negative.   Physical Exam Updated Vital Signs BP (!) 84/52 (BP Location: Right Arm)   Pulse (!) 101   Temp 99.2 F (37.3 C)   Resp 18   Ht 6' (1.829 m)   Wt 83.9 kg   SpO2 96%   BMI 25.09 kg/m  Physical Exam Vitals and nursing note reviewed.  Constitutional:      General: He is not in acute distress.    Appearance: He is well-developed.  HENT:     Head: Normocephalic and atraumatic.  Eyes:     Conjunctiva/sclera: Conjunctivae normal.  Cardiovascular:     Rate and Rhythm: Regular rhythm. Tachycardia present.     Heart sounds: No murmur heard. Pulmonary:     Effort: Pulmonary effort is normal. No respiratory distress.     Breath sounds: Normal breath sounds.  Abdominal:     Palpations: Abdomen is soft.     Tenderness: There is abdominal tenderness in the right lower quadrant, suprapubic area and left lower quadrant. There is no guarding or rebound.  Musculoskeletal:        General: No swelling.     Cervical back: Neck supple.  Skin:    General: Skin is warm and dry.     Capillary Refill: Capillary refill takes less than 2 seconds.  Neurological:     Mental Status: He is alert.     GCS: GCS eye subscore is 4. GCS verbal subscore is 5. GCS motor subscore is 6.  Psychiatric:        Mood and Affect: Mood normal.     ED Results / Procedures / Treatments   Labs (all labs ordered are listed, but only abnormal results are  displayed) Labs Reviewed  CULTURE, BLOOD (ROUTINE X 2)  CULTURE, BLOOD (ROUTINE X 2)  LIPASE, BLOOD  COMPREHENSIVE METABOLIC PANEL  CBC  URINALYSIS, ROUTINE W REFLEX MICROSCOPIC  LACTIC ACID, PLASMA  LACTIC ACID, PLASMA  PROTIME-INR  TROPONIN I (HIGH SENSITIVITY)    EKG None  Radiology No results found.  Procedures Procedures  {Document cardiac monitor, telemetry assessment procedure when appropriate:1}  Medications Ordered in ED Medications  sodium chloride 0.9 % bolus 1,000 mL (has no administration in time range)  loperamide (IMODIUM) capsule 2 mg (has no administration in time range)    ED Course/ Medical  Decision Making/ A&P   {   Click here for ABCD2, HEART and other calculatorsREFRESH Note before signing :1}                          Medical Decision Making Amount and/or Complexity of Data Reviewed Labs: ordered. Radiology: ordered.  Risk Prescription drug management.   89:80 PM 72 year old male senting for diarrhea. Patient presented with tachycardia of 101, hypotension of 84/52, and temperature of 99.2 F triggering sepsis criteria.  Blood cultures collected, lactic acid collected, labs collected.  IV fluids initiated for likely dehydration.  Imodium given.  Tylenol given for temperature.  On exam patient is alert oriented x 3, pale appearing, blood pressure at bedside 122/65.  Abdomen is soft with no guarding or distention.  Tenderness to palpation in the lower quadrants.   Chart review demonstrates past medical history pertinent for myelodysplastic syndrome, severe aortic root dilation diagnosed in 2024, abdominal aortic aneurysm, A-fib on amiodarone, CHF with EF of 40 to 45% 2024, hypertension, hypothyroidism, and previous myocardial infarction.      {Document critical care time when appropriate:1} {Document review of labs and clinical decision tools ie heart score, Chads2Vasc2 etc:1}  {Document your independent review of radiology images, and any  outside records:1} {Document your discussion with family members, caretakers, and with consultants:1} {Document social determinants of health affecting pt's care:1} {Document your decision making why or why not admission, treatments were needed:1} Final Clinical Impression(s) / ED Diagnoses Final diagnoses:  None    Rx / DC Orders ED Discharge Orders     None

## 2022-11-07 ENCOUNTER — Other Ambulatory Visit: Payer: Self-pay | Admitting: Hematology

## 2022-11-07 DIAGNOSIS — R197 Diarrhea, unspecified: Secondary | ICD-10-CM | POA: Diagnosis not present

## 2022-11-07 DIAGNOSIS — D469 Myelodysplastic syndrome, unspecified: Secondary | ICD-10-CM | POA: Diagnosis not present

## 2022-11-07 DIAGNOSIS — D539 Nutritional anemia, unspecified: Secondary | ICD-10-CM | POA: Diagnosis not present

## 2022-11-07 DIAGNOSIS — N3000 Acute cystitis without hematuria: Secondary | ICD-10-CM | POA: Diagnosis not present

## 2022-11-07 LAB — GASTROINTESTINAL PANEL BY PCR, STOOL (REPLACES STOOL CULTURE)

## 2022-11-07 LAB — COMPREHENSIVE METABOLIC PANEL
ALT: 14 U/L (ref 0–44)
AST: 21 U/L (ref 15–41)
Albumin: 2.7 g/dL — ABNORMAL LOW (ref 3.5–5.0)
Alkaline Phosphatase: 41 U/L (ref 38–126)
Anion gap: 13 (ref 5–15)
BUN: 62 mg/dL — ABNORMAL HIGH (ref 8–23)
CO2: 16 mmol/L — ABNORMAL LOW (ref 22–32)
Calcium: 8.2 mg/dL — ABNORMAL LOW (ref 8.9–10.3)
Chloride: 109 mmol/L (ref 98–111)
Creatinine, Ser: 2.31 mg/dL — ABNORMAL HIGH (ref 0.61–1.24)
GFR, Estimated: 30 mL/min — ABNORMAL LOW (ref >60)
Glucose, Bld: 109 mg/dL — ABNORMAL HIGH (ref 70–99)
Potassium: 2.8 mmol/L — ABNORMAL LOW (ref 3.5–5.1)
Sodium: 138 mmol/L (ref 135–145)
Total Bilirubin: 0.9 mg/dL (ref 0.3–1.2)
Total Protein: 6.2 g/dL — ABNORMAL LOW (ref 6.5–8.1)

## 2022-11-07 LAB — CBC
HCT: 20 % — ABNORMAL LOW (ref 39.0–52.0)
Hemoglobin: 6.6 g/dL — CL (ref 13.0–17.0)
MCH: 36.3 pg — ABNORMAL HIGH (ref 26.0–34.0)
MCHC: 33 g/dL (ref 30.0–36.0)
MCV: 109.9 fL — ABNORMAL HIGH (ref 80.0–100.0)
Platelets: 90 10*3/uL — ABNORMAL LOW (ref 150–400)
RBC: 1.82 MIL/uL — ABNORMAL LOW (ref 4.22–5.81)
RDW: 14.7 % (ref 11.5–15.5)
WBC: 2.1 10*3/uL — ABNORMAL LOW (ref 4.0–10.5)
nRBC: 0 % (ref 0.0–0.2)

## 2022-11-07 LAB — HEMOGLOBIN AND HEMATOCRIT, BLOOD
HCT: 17.7 % — ABNORMAL LOW (ref 39.0–52.0)
Hemoglobin: 5.9 g/dL — CL (ref 13.0–17.0)

## 2022-11-07 LAB — HIV ANTIBODY (ROUTINE TESTING W REFLEX): HIV Screen 4th Generation wRfx: NONREACTIVE

## 2022-11-07 LAB — RETICULOCYTES
Immature Retic Fract: 19 % — ABNORMAL HIGH (ref 2.3–15.9)
RBC.: 1.69 MIL/uL — ABNORMAL LOW (ref 4.22–5.81)
Retic Count, Absolute: 17.7 10*3/uL — ABNORMAL LOW (ref 19.0–186.0)
Retic Ct Pct: 1.1 % (ref 0.4–3.1)

## 2022-11-07 LAB — BPAM RBC
Blood Product Expiration Date: 202407202359
Unit Type and Rh: 5100

## 2022-11-07 LAB — OCCULT BLOOD X 1 CARD TO LAB, STOOL: Fecal Occult Bld: NEGATIVE

## 2022-11-07 LAB — MAGNESIUM: Magnesium: 1.9 mg/dL (ref 1.7–2.4)

## 2022-11-07 LAB — TYPE AND SCREEN: Unit division: 0

## 2022-11-07 LAB — VITAMIN B12: Vitamin B-12: 410 pg/mL (ref 180–914)

## 2022-11-07 LAB — PHOSPHORUS: Phosphorus: 2.6 mg/dL (ref 2.5–4.6)

## 2022-11-07 LAB — FOLATE: Folate: 25.1 ng/mL (ref >5.9)

## 2022-11-07 LAB — PREPARE RBC (CROSSMATCH)

## 2022-11-07 MED ORDER — POTASSIUM CHLORIDE CRYS ER 20 MEQ PO TBCR
40.0000 meq | EXTENDED_RELEASE_TABLET | Freq: Two times a day (BID) | ORAL | Status: DC
  Administered 2022-11-07 – 2022-11-09 (×4): 40 meq via ORAL
  Filled 2022-11-07 (×4): qty 2

## 2022-11-07 MED ORDER — LOPERAMIDE HCL 2 MG PO CAPS
4.0000 mg | ORAL_CAPSULE | Freq: Once | ORAL | Status: AC
  Administered 2022-11-07: 4 mg via ORAL
  Filled 2022-11-07: qty 2

## 2022-11-07 MED ORDER — LACTATED RINGERS IV SOLN: INTRAVENOUS | Status: AC

## 2022-11-07 MED ORDER — ASPIRIN 81 MG PO CHEW: 81.0000 mg | CHEWABLE_TABLET | Freq: Every day | ORAL | Status: DC

## 2022-11-07 MED ORDER — PANTOPRAZOLE SODIUM 40 MG IV SOLR: 40.0000 mg | Freq: Two times a day (BID) | INTRAVENOUS | Status: DC

## 2022-11-07 MED ORDER — POTASSIUM CHLORIDE CRYS ER 20 MEQ PO TBCR
40.0000 meq | EXTENDED_RELEASE_TABLET | Freq: Once | ORAL | Status: AC
  Administered 2022-11-07: 40 meq via ORAL
  Filled 2022-11-07: qty 2

## 2022-11-07 MED ORDER — AMIODARONE HCL 200 MG PO TABS
200.0000 mg | ORAL_TABLET | Freq: Two times a day (BID) | ORAL | Status: DC
  Administered 2022-11-07 – 2022-11-09 (×5): 200 mg via ORAL
  Filled 2022-11-07 (×5): qty 1

## 2022-11-07 MED ORDER — POTASSIUM CHLORIDE 10 MEQ/100ML IV SOLN
10.0000 meq | INTRAVENOUS | Status: DC
  Administered 2022-11-07: 10 meq via INTRAVENOUS
  Filled 2022-11-07: qty 100

## 2022-11-07 MED ORDER — SODIUM CHLORIDE 0.9% IV SOLUTION: Freq: Once | INTRAVENOUS | Status: AC

## 2022-11-07 MED ORDER — LEVOTHYROXINE SODIUM 25 MCG PO TABS
25.0000 ug | ORAL_TABLET | Freq: Every day | ORAL | Status: DC
  Administered 2022-11-07 – 2022-11-08 (×2): 25 ug via ORAL
  Filled 2022-11-07 (×2): qty 1

## 2022-11-07 MED ORDER — POTASSIUM CHLORIDE 10 MEQ/100ML IV SOLN: 10.0000 meq | INTRAVENOUS | Status: AC

## 2022-11-07 NOTE — TOC Initial Note (Signed)
Transition of Care [facility]) - Initial/Assessment Note    Patient Details  Name: John Hicks MRN: [id number] Date of Birth: Apr 15, 1951  Transition of Care [facility]) CM/SW Contact:    Annice Needy, LCSW Phone Number: 11/07/2022, 9:30 AM  Clinical Narrative:                 Patient from home with cousin and cousin's wife. He is their tenant. He is independent with ADLs and uses a single point cane when ambulating. He drives himself to appointments. Patient's PCP is Dr. Sherwood Gambler at [facility].    Expected Discharge Plan: Home/Self Care Barriers to Discharge: Continued Medical Work up   Patient Goals and CMS Choice Patient states their goals for this hospitalization and ongoing recovery are:: return home          Expected Discharge Plan and Services       Living arrangements for the past 2 months: Single Family Home                                      Prior Living Arrangements/Services Living arrangements for the past 2 months: Single Family Home Lives with:: Relatives Patient language and need for interpreter reviewed:: Yes Do you feel safe going back to the place where you live?: Yes      Need for Family Participation in Patient Care: Yes (Comment) Care giver support system in place?: Yes (comment) Current home services: DME (single point cane) Criminal Activity/Legal Involvement Pertinent to Current Situation/Hospitalization: No - Comment as needed  Activities of Daily Living      Permission Sought/Granted                  Emotional Assessment     Affect (typically observed): Appropriate Orientation: : Oriented to Self, Oriented to Place, Oriented to  Time, Oriented to Situation Alcohol / Substance Use: Not Applicable Psych Involvement: No (comment)  Admission diagnosis:  Hypokalemia [E87.6] Colitis [K52.9] UTI (urinary tract infection) [N39.0] Prostate mass [N42.89] AKI (acute kidney injury) (HCC) [N17.9] Urinary tract infection  without hematuria, site unspecified [N39.0] Leukopenia, unspecified type [D72.819] Diarrhea, unspecified type [R19.7] Sepsis, due to unspecified organism, unspecified whether acute organ dysfunction present [facility]) [A41.9] Patient Active Problem List   Diagnosis Date Noted   UTI (urinary tract infection) 11/06/2022   Lactic acidosis 11/06/2022   Acute diarrhea 11/06/2022   Elevated troponin 11/06/2022   Macrocytic anemia 11/06/2022   Leukopenia 11/06/2022   Thrombocytopenia (HCC) 11/06/2022   Hypokalemia 11/06/2022   Prolonged QT interval 11/06/2022   Prostatic mass 11/06/2022   Atrial fibrillation, chronic (HCC) 11/06/2022   Acquired hypothyroidism 11/06/2022   Chronic combined systolic and diastolic CHF (congestive heart failure) (HCC) 11/06/2022   Aortic root dilatation (HCC) 09/10/2022   [name] 09/06/2022   Rectal bleeding 07/03/2022   Thoracic aortic aneurysm without rupture (HCC) 06/23/2021   Diastolic dysfunction 10/28/2020   Constipation 09/27/2020   AAA (abdominal aortic aneurysm) without rupture (HCC) 01/29/2019   AAA (abdominal aortic aneurysm) (HCC) 01/28/2019   Acute on chronic systolic heart failure (HCC) 09/26/2016   Acute on chronic systolic CHF (congestive heart failure), NYHA class 4 (HCC) 09/26/2016   Encounter for screening colonoscopy    Palliative care by specialist    NSVT (nonsustained ventricular tachycardia) (HCC)    PAF (paroxysmal atrial fibrillation) (HCC)    Cardiogenic shock (HCC)    AKI (  acute kidney injury) (HCC)    Typical atrial flutter (HCC)    Acute exacerbation of congestive heart failure (HCC) 08/12/2016   Tobacco use disorder 08/12/2016   Essential hypertension 08/12/2016   Urinary tract infection 08/12/2016   PCP:  Elfredia Nevins, MD Pharmacy:   [facility] - [place], [place] - [address] [place] [address] Phone: [phone] Fax: [phone]     Social Determinants of  Health (SDOH) Social History: SDOH Screenings   Food Insecurity: Food Insecurity Present (06/30/2022)  Housing: Low Risk  (06/30/2022)  Transportation Needs: No Transportation Needs (06/30/2022)  Utilities: Not At Risk (06/30/2022)  Depression (PHQ2-9): Low Risk  (06/30/2022)  Tobacco Use: High Risk (11/06/2022)   SDOH Interventions:     Readmission Risk Interventions     No data to display

## 2022-11-07 NOTE — Progress Notes (Signed)
Date and time results received: 11/07/22 0602 (use smartphrase ".now" to insert current time)  Test: HBG Critical Value: 6.6  Name of Provider Notified: Adesfeso  Orders Received? Or Actions Taken?: None at this time.

## 2022-11-07 NOTE — Consult Note (Signed)
[facility]  Name: MATAEO PERSING      MRN: [id number]    Location: N562/Z308-65  Date: 11/07/2022 Time:5:38 PM   REFERRING PHYSICIAN: Dr. Sherryll Burger  REASON FOR CONSULT: Severe anemia, UTI   DIAGNOSIS: MDS on chemotherapy  HISTORY OF PRESENT ILLNESS: Mr. Gillott is a 72 year old male known to me from office visits.  He has recently diagnosed MDS with excessive blasts and was treated on 10/23/2022 with azacitidine for 7 days.  He subsequently developed diarrhea and weakness and came to the ER.  He reported that diarrhea started on Sunday.  He reports it as mucousy with specks of stool in it.  It is not watery.  CT scan in the ER showed subtle wall thickening along the sigmoid colon with stranding.  C. difficile and GI stool panel were sent.  Found to have UTI and is currently receiving antibiotics.  Today he received 2 units PRBC.  PAST MEDICAL HISTORY:   Past Medical History:  Diagnosis Date   AAA (abdominal aortic aneurysm) (HCC)    AF (atrial fibrillation) (HCC)    Anxiety    BPH (benign prostatic hyperplasia)    CHF (congestive heart failure) (HCC)    Chronic kidney disease    stage 3   Depression    Dyspnea    Dysrhythmia    Hypertension    Hypothyroid    Myocardial infarction (HCC)     ALLERGIES: Allergies  Allergen Reactions   Codeine Other (See Comments)    agitation      MEDICATIONS: I have reviewed the patient's current medications.     PAST SURGICAL HISTORY Past Surgical History:  Procedure Laterality Date   ABDOMINAL AORTIC ENDOVASCULAR STENT GRAFT N/A 02/03/2019   Procedure: ABDOMINAL AORTIC ENDOVASCULAR STENT GRAFT;  Surgeon: Maeola Harman, MD;  Location: [facility];  Service: Vascular;  Laterality: N/A;   APPENDECTOMY     KNEE SURGERY     PORTACATH PLACEMENT Right 10/12/2022   Procedure: INSERTION PORT-A-CATH;  Surgeon: Lucretia Roers, MD;  Location: [facility];  Service: General;  Laterality: Right;   ULTRASOUND GUIDANCE FOR  VASCULAR ACCESS  02/03/2019   Procedure: Ultrasound Guidance For Vascular Access;  Surgeon: Maeola Harman, MD;  Location: [facility] OR;  Service: Vascular;;    FAMILY HISTORY: Family History  Problem Relation Age of Onset   Stroke Father 59       went in the bathroom and collapsed   Multiple sclerosis Sister    Psychiatric Illness Mother 57    SOCIAL HISTORY:  reports that he has been smoking cigarettes. He has been smoking an average of 1 pack per day. He has been exposed to tobacco smoke. He has never used smokeless tobacco. He reports current drug use. Drug: Marijuana. He reports that he does not drink alcohol.  PERFORMANCE STATUS: The patient's performance status is 1 - Symptomatic but completely ambulatory  PHYSICAL EXAM: Most Recent Vital Signs: Blood pressure 125/74, pulse 61, temperature (!) 97.5 F (36.4 C), temperature source Oral, resp. rate 18, height 6' (1.829 m), weight 185 lb (83.9 kg), SpO2 100 %. BP 125/74   Pulse 61   Temp (!) 97.5 F (36.4 C) (Oral)   Resp 18   Ht 6' (1.829 m)   Wt 185 lb (83.9 kg)   SpO2 100%   BMI 25.09 kg/m  General appearance: alert, cooperative, and appears stated age Lungs: clear to auscultation bilaterally Heart: regular rate and rhythm Extremities:  No edema cyanosis Neurologic:  Grossly normal  LABORATORY DATA:  Results for orders placed or performed during the hospital encounter of 11/06/22 (from the past 48 hour(s))  Lipase, blood     Status: None   Collection Time: 11/06/22  3:30 PM  Result Value Ref Range   Lipase 30 11 - 51 U/L    Comment: Performed at [facility], [address] [place], [place] [address]  Comprehensive metabolic panel     Status: Abnormal   Collection Time: 11/06/22  3:30 PM  Result Value Ref Range   Sodium 136 135 - 145 mmol/L   Potassium 2.7 (LL) 3.5 - 5.1 mmol/L    Comment: CRITICAL RESULT CALLED TO, READ BACK BY AND VERIFIED WITH DR Durwin Nora ON 11/06/22 AT 1700 BY LOY,C   Chloride 104 98 - 111  mmol/L   CO2 18 (L) 22 - 32 mmol/L   Glucose, Bld 112 (H) 70 - 99 mg/dL    Comment: Glucose reference range applies only to samples taken after fasting for at least 8 hours.   BUN 63 (H) 8 - 23 mg/dL   Creatinine, Ser 8.11 (H) 0.61 - 1.24 mg/dL   Calcium 9.0 8.9 - 91.4 mg/dL   Total Protein 7.3 6.5 - 8.1 g/dL   Albumin 3.2 (L) 3.5 - 5.0 g/dL   AST 24 15 - 41 U/L   ALT 15 0 - 44 U/L   Alkaline Phosphatase 55 38 - 126 U/L   Total Bilirubin 1.2 0.3 - 1.2 mg/dL   GFR, Estimated 26 (L) >60 mL/min    Comment: (NOTE) Calculated using the CKD-EPI Creatinine Equation (2021)    Anion gap 14 5 - 15    Comment: Performed at [facility], [address] [place], [place] [address]  CBC     Status: Abnormal   Collection Time: 11/06/22  3:30 PM  Result Value Ref Range   WBC 2.1 (L) 4.0 - 10.5 K/uL   RBC 2.01 (L) 4.22 - 5.81 MIL/uL   Hemoglobin 7.2 (L) 13.0 - 17.0 g/dL   HCT 62.1 (L) 30.8 - 65.7 %   MCV 107.5 (H) 80.0 - 100.0 fL   MCH 35.8 (H) 26.0 - 34.0 pg   MCHC 33.3 30.0 - 36.0 g/dL   RDW 84.6 96.2 - 95.2 %   Platelets 102 (L) 150 - 400 K/uL    Comment: Immature Platelet Fraction may be clinically indicated, consider ordering this additional test [id number]    nRBC 0.0 0.0 - 0.2 %    Comment: Performed at [facility], [address], [place], [place] [address]  Troponin I (High Sensitivity)     Status: Abnormal   Collection Time: 11/06/22  3:30 PM  Result Value Ref Range   Troponin I (High Sensitivity) 30 (H) <18 ng/L    Comment: (NOTE) Elevated high sensitivity troponin I (hsTnI) values and significant  changes across serial measurements may suggest ACS but many other  chronic and acute conditions are known to elevate hsTnI results.  Refer to the "Links" section for chest pain algorithms and additional  guidance. Performed at [facility], [address] [place], [place] [address]   Protime-INR     Status: Abnormal   Collection Time: 11/06/22  3:30 PM  Result Value Ref Range    Prothrombin Time 16.0 (H) 11.4 - 15.2 seconds   INR 1.3 (H) 0.8 - 1.2    Comment: (NOTE) INR goal varies based on device and disease states. Performed at [facility], [address], [place],  [place] [address]   Urinalysis, Routine w reflex microscopic -Urine, Clean Catch     Status: Abnormal   Collection Time: 11/06/22  3:33 PM  Result Value Ref Range   Color, Urine YELLOW YELLOW   APPearance CLOUDY (A) CLEAR   Specific Gravity, Urine 1.011 1.005 - 1.030   pH 5.0 5.0 - 8.0   Glucose, UA NEGATIVE NEGATIVE mg/dL   Hgb urine dipstick MODERATE (A) NEGATIVE   Bilirubin Urine NEGATIVE NEGATIVE   Ketones, ur NEGATIVE NEGATIVE mg/dL   Protein, ur 604 (A) NEGATIVE mg/dL   Nitrite NEGATIVE NEGATIVE   Leukocytes,Ua LARGE (A) NEGATIVE   RBC / HPF 11-20 0 - 5 RBC/hpf   WBC, UA >50 0 - 5 WBC/hpf   Bacteria, UA MANY (A) NONE SEEN   Squamous Epithelial / HPF 0-5 0 - 5 /HPF   WBC Clumps PRESENT    Mucus PRESENT    Non Squamous Epithelial 0-5 (A) NONE SEEN    Comment: Performed at [facility], [address], [place], [place] [address]  Lactic acid, plasma     Status: Abnormal   Collection Time: 11/06/22  4:08 PM  Result Value Ref Range   Lactic Acid, Venous 2.3 (HH) 0.5 - 1.9 mmol/L    Comment: CRITICAL RESULT CALLED TO, READ BACK BY AND VERIFIED WITH DR Durwin Nora ON 11/06/22 AT 1700 BY LOY,C Performed at [facility], [address] [place], [place] [address]   Blood culture (routine x 2)     Status: None (Preliminary result)   Collection Time: 11/06/22  4:08 PM   Specimen: BLOOD  Result Value Ref Range   Specimen Description BLOOD RIGHT ASSIST CONTROL    Special Requests      BOTTLES DRAWN AEROBIC AND ANAEROBIC Blood Culture results may not be optimal due to an excessive volume of blood received in culture bottles   Culture      NO GROWTH < 24 HOURS Performed at [facility], [address] [place], [place] [address]    Report Status PENDING   Blood culture (routine x 2)     Status: None  (Preliminary result)   Collection Time: 11/06/22  4:11 PM   Specimen: BLOOD  Result Value Ref Range   Specimen Description BLOOD RIGHT FATTY CASTS    Special Requests      BOTTLES DRAWN AEROBIC AND ANAEROBIC Blood Culture adequate volume   Culture      NO GROWTH < 24 HOURS Performed at [facility], [address], [place], [place] [address]    Report Status PENDING   Lactic acid, plasma     Status: None   Collection Time: 11/06/22  6:15 PM  Result Value Ref Range   Lactic Acid, Venous 1.2 0.5 - 1.9 mmol/L    Comment: Performed at [facility], [address], [place], [place] [address]  Troponin I (High Sensitivity)     Status: Abnormal   Collection Time: 11/06/22  6:15 PM  Result Value Ref Range   Troponin I (High Sensitivity) 26 (H) <18 ng/L    Comment: (NOTE) Elevated high sensitivity troponin I (hsTnI) values and significant  changes across serial measurements may suggest ACS but many other  chronic and acute conditions are known to elevate hsTnI results.  Refer to the "Links" section for chest pain algorithms and additional  guidance. Performed at [facility], [address] [place], [place] [address]   Magnesium     Status: None   Collection Time: 11/06/22  6:15 PM  Result Value Ref Range   Magnesium 2.0 1.7 - 2.4 mg/dL    Comment: Performed at [facility], [address], [place], [place] [address]  C Difficile Quick Screen w PCR reflex     Status: None   Collection Time: 11/06/22  8:35 PM   Specimen: STOOL  Result Value Ref Range   C Diff antigen NEGATIVE NEGATIVE   C Diff toxin NEGATIVE NEGATIVE   C Diff interpretation No C. difficile detected.     Comment: Performed at [facility], [address] [place], [place] [address]  Gastrointestinal Panel by PCR , Stool     Status: None   Collection Time: 11/06/22  8:35 PM   Specimen: STOOL  Result Value Ref Range   Campylobacter species NOT DETECTED NOT DETECTED   Plesimonas shigelloides NOT DETECTED NOT DETECTED    Salmonella species NOT DETECTED NOT DETECTED   Yersinia enterocolitica NOT DETECTED NOT DETECTED   Vibrio species NOT DETECTED NOT DETECTED   Vibrio cholerae NOT DETECTED NOT DETECTED   Enteroaggregative E coli (EAEC) NOT DETECTED NOT DETECTED   Enteropathogenic E coli (EPEC) NOT DETECTED NOT DETECTED   Enterotoxigenic E coli (ETEC) NOT DETECTED NOT DETECTED   Shiga like toxin producing E coli (STEC) NOT DETECTED NOT DETECTED   Shigella/Enteroinvasive E coli (EIEC) NOT DETECTED NOT DETECTED   Cryptosporidium NOT DETECTED NOT DETECTED   Cyclospora cayetanensis NOT DETECTED NOT DETECTED   Entamoeba histolytica NOT DETECTED NOT DETECTED   Giardia lamblia NOT DETECTED NOT DETECTED   Adenovirus F40/41 NOT DETECTED NOT DETECTED   Astrovirus NOT DETECTED NOT DETECTED   Norovirus GI/GII NOT DETECTED NOT DETECTED   Rotavirus A NOT DETECTED NOT DETECTED   Sapovirus (I, II, IV, and V) NOT DETECTED NOT DETECTED    Comment: Performed at [facility], [address] [place], [place] [address]  Comprehensive metabolic panel     Status: Abnormal   Collection Time: 11/07/22  4:48 AM  Result Value Ref Range   Sodium 138 135 - 145 mmol/L   Potassium 2.8 (L) 3.5 - 5.1 mmol/L   Chloride 109 98 - 111 mmol/L   CO2 16 (L) 22 - 32 mmol/L   Glucose, Bld 109 (H) 70 - 99 mg/dL    Comment: Glucose reference range applies only to samples taken after fasting for at least 8 hours.   BUN 62 (H) 8 - 23 mg/dL   Creatinine, Ser 9.14 (H) 0.61 - 1.24 mg/dL   Calcium 8.2 (L) 8.9 - 10.3 mg/dL   Total Protein 6.2 (L) 6.5 - 8.1 g/dL   Albumin 2.7 (L) 3.5 - 5.0 g/dL   AST 21 15 - 41 U/L   ALT 14 0 - 44 U/L   Alkaline Phosphatase 41 38 - 126 U/L   Total Bilirubin 0.9 0.3 - 1.2 mg/dL   GFR, Estimated 30 (L) >60 mL/min    Comment: (NOTE) Calculated using the CKD-EPI Creatinine Equation (2021)    Anion gap 13 5 - 15    Comment: Performed at [facility], [address], [place], [place] [address]  CBC      Status: Abnormal   Collection Time: 11/07/22  4:48 AM  Result Value Ref Range   WBC 2.1 (L) 4.0 - 10.5 K/uL   RBC 1.82 (L) 4.22 - 5.81 MIL/uL   Hemoglobin 6.6 (LL) 13.0 - 17.0 g/dL    Comment: REPEATED TO VERIFY THIS CRITICAL RESULT HAS VERIFIED AND BEEN CALLED TO T KILMER BY Cecil Cranker  ON 06 18 2024 AT 0603, AND HAS BEEN READ BACK.     HCT 20.0 (L) 39.0 - 52.0 %   MCV 109.9 (H) 80.0 - 100.0 fL   MCH 36.3 (H) 26.0 - 34.0 pg   MCHC 33.0 30.0 - 36.0 g/dL   RDW 40.9 81.1 - 91.4 %   Platelets 90 (L) 150 - 400 K/uL    Comment: SPECIMEN CHECKED FOR CLOTS Immature Platelet Fraction may be clinically indicated, consider ordering this additional test [id number] REPEATED TO VERIFY    nRBC 0.0 0.0 - 0.2 %    Comment: Performed at [facility], [address] [place], [place] [address]  Magnesium     Status: None   Collection Time: 11/07/22  4:48 AM  Result Value Ref Range   Magnesium 1.9 1.7 - 2.4 mg/dL    Comment: Performed at [facility], [address], [place], [place] [address]  Phosphorus     Status: None   Collection Time: 11/07/22  4:48 AM  Result Value Ref Range   Phosphorus 2.6 2.5 - 4.6 mg/dL    Comment: Performed at [facility], [address] [place], [place] [address]  HIV Antibody (routine testing w rflx)     Status: None   Collection Time: 11/07/22  4:48 AM  Result Value Ref Range   HIV Screen 4th Generation wRfx Non Reactive Non Reactive    Comment: Performed at [facility], [address], [place], [place] [address]  Vitamin B12     Status: None   Collection Time: 11/07/22  4:48 AM  Result Value Ref Range   Vitamin B-12 410 180 - 914 pg/mL    Comment: (NOTE) This assay is not validated for testing neonatal or myeloproliferative syndrome specimens for Vitamin B12 levels. Performed at [facility], [address], [place], [place] [address]   Folate     Status: None   Collection Time: 11/07/22  4:48 AM  Result Value Ref Range   Folate 25.1 >5.9 ng/mL     Comment: RESULT CONFIRMED BY MANUAL DILUTION Performed at [facility], [address] [place], [place] [address]   Hemoglobin and hematocrit, blood     Status: Abnormal   Collection Time: 11/07/22  7:04 AM  Result Value Ref Range   Hemoglobin 5.9 (LL) 13.0 - 17.0 g/dL    Comment: REPEATED TO VERIFY THIS CRITICAL RESULT HAS VERIFIED AND BEEN CALLED TO HAIRSTON,DIANA BY ASHLEY FRATTO ON 06 18 2024 AT 0735, AND HAS BEEN READ BACK.     HCT 17.7 (L) 39.0 - 52.0 %    Comment: Performed at [facility], [address] [place], [place] [address]  Reticulocytes     Status: Abnormal   Collection Time: 11/07/22  8:17 AM  Result Value Ref Range   Retic Ct Pct 1.1 0.4 - 3.1 %   RBC. 1.69 (L) 4.22 - 5.81 MIL/uL   Retic Count, Absolute 17.7 (L) 19.0 - 186.0 K/uL   Immature Retic Fract 19.0 (H) 2.3 - 15.9 %    Comment: Performed at [facility], [address] [place], [place] [address]  Type and screen [facility]     Status: None (Preliminary result)   Collection Time: 11/07/22  8:17 AM  Result Value Ref Range   ABO/RH(D) O POS    Antibody Screen NEG    Sample Expiration 11/10/2022,2359    Unit Number [id number]    Blood Component Type RED CELLS,LR    Unit division 00  Status of Unit ISSUED    Transfusion Status OK TO TRANSFUSE    Crossmatch Result Compatible    Unit Number [id number]    Blood Component Type RED CELLS,LR    Unit division 00    Status of Unit ISSUED    Transfusion Status OK TO TRANSFUSE    Crossmatch Result      Compatible Performed at [facility], [address] [place], [place] [address]   Prepare RBC (crossmatch)     Status: None   Collection Time: 11/07/22  8:17 AM  Result Value Ref Range   Order Confirmation      ORDER PROCESSED BY BLOOD BANK Performed at [facility], [address], [place], [place] [address]   Occult blood card to lab, stool     Status: None   Collection Time: 11/07/22 10:05 AM  Result Value Ref Range   Fecal Occult Bld  NEGATIVE NEGATIVE    Comment: Performed at [facility], [address], [place], [place] [address]      RADIOGRAPHY: DG Chest Portable 1 View  Result Date: 11/06/2022 CLINICAL DATA:  Cough. EXAM: PORTABLE CHEST 1 VIEW COMPARISON:  Chest radiograph dated 10/12/2022. FINDINGS: Right-sided Port-A-Cath in similar position. No focal consolidation, pleural effusion, or pneumothorax. Stable cardiomegaly. Atherosclerotic calcification of the aortic arch. No acute osseous pathology. IMPRESSION: 1. No active disease. 2. Cardiomegaly. Electronically Signed   By: Elgie Collard M.D.   On: 11/06/2022 19:34   CT ABDOMEN PELVIS WO CONTRAST  Result Date: 11/06/2022 CLINICAL DATA:  Diarrhea, lower abdominal pain. Hypotension with sepsis. Patient has leukemia. EXAM: CT ABDOMEN AND PELVIS WITHOUT CONTRAST TECHNIQUE: Multidetector CT imaging of the abdomen and pelvis was performed following the standard protocol without IV contrast. RADIATION DOSE REDUCTION: This exam was performed according to the departmental dose-optimization program which includes automated exposure control, adjustment of the mA and/or kV according to patient size and/or use of iterative reconstruction technique. COMPARISON:  CT 08/23/2022 without contrast FINDINGS: Lower chest: Heart is enlarged. Coronary artery calcifications are seen. There is some linear opacity at the lung bases likely scar or atelectasis. Tiny left effusion. Mildly patulous esophagus. Hepatobiliary: Stones in the gallbladder. On this non IV contrast exam, the liver parenchyma is grossly preserved. Pancreas: Mild pancreatic atrophy. Spleen: Spleen is enlarged with cephalocaudal length of 13.9 cm. Spleen is slightly heterogeneous. Adrenals/Urinary Tract: Slight nonspecific nodular thickening of the adrenal glands. Focal area on the right side measures 12 mm on series 3, image 22 measuring Hounsfield unit of 4, a benign adenoma. Numerous bilateral small renal cysts are identified  again Bosniak 1 and 2 lesions. No specific imaging follow-up. No obstructing renal or ureteral stones. Slight ectasia of the renal collecting systems and ureters down to the bladder. The bladder wall is diffusely slightly thickened with some trabeculation and there is an enlarged prostate. Stomach/Bowel: No oral contrast. Stomach is underdistended. Small bowel is nondilated. Slight wall thickening along the sigmoid colon but the bowel is underdistended. Slight adjacent stranding. Please correlate for any clinical evidence of colitis. No proximal obstruction. Appendix is poorly seen in the right lower quadrant but no pericecal stranding or fluid. Vascular/Lymphatic: Aortic endograft in place, incompletely evaluated on this noncontrast examination. Diameter of the aneurysm previously was measured at 6.5 cm and today when measured in the same fashion as the prior measures 6.5 cm. Dedicated workup as clinically appropriate. Reproductive: Enlarged prostate with mass effect along the base of the bladder. Other: No free intra-air.  Small fat containing  inguinal hernias. Musculoskeletal: Curvature of the spine with degenerative changes. Multilevel stenosis. IMPRESSION: Subtle wall thickening along the sigmoid colon with stranding. A subtle colitis is possible. No obstruction or free air. No free fluid. Ectatic renal collecting systems dome of the bladder. The bladder wall is thickened and trabeculated and there is a large prostate with mass effect along the base of the bladder. No obstructing stones. Enlarged heart. Gallstones.  Gallbladder is nondilated. Splenomegaly. Aortic endograft with the abdominal aortic aneurysm 6.5 cm, similar to previous. Overall the endograft in the aneurysm is incompletely evaluated on this noncontrast exam. If needed dedicated endograft protocol postcontrast study could be considered if needed clinically Electronically Signed   By: Karen Kays M.D.   On: 11/06/2022 19:31         ASSESSMENT  and PLAN:  1.  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 - He was supposed to see me on 11/06/2022, but felt weak from diarrhea and came to the ER.  Found to have hemoglobin 7.2, down from 8.1 on 10/23/2022. - Hemoglobin further dropped to 6.6 and 5.9 on 11/07/2022.  He received 2 units PRBC. - WBC is 2.1.  Differential not available.  Platelet count stable at 90 K.  2.  UTI: - Continue IV ceftriaxone.  Urine culture on 10/21/2021 was positive for Enterobacter cloacae. - Urine cultures are pending.  3.  Diarrhea: - Reports mucousy bowel movement with diffuse specks of actual stool since Sunday.  He reportedly had bowel movement 20 times today. - C. difficile testing was negative.  GI panel is also negative.  Stool for occult blood is negative. - Most likely azacitidine induced.  Use Imodium as needed.  3.  Hypokalemia: - Most likely from diarrhea.  He is already on K-Dur 40 mEq twice daily.  Potassium today is 2.8.  This was prior to morning dose.  I will give an extra dose of K-Dur 40 mEq p.o. today.  All questions were answered. The patient knows to call the clinic with any problems, questions or concerns. We can certainly see the patient much sooner if necessary.    Doreatha Massed

## 2022-11-07 NOTE — Progress Notes (Addendum)
PROGRESS NOTE    John Hicks  ZOX:[id number] DOB: 1950/08/02 DOA: 11/06/2022 PCP: Elfredia Nevins, MD   Brief Narrative:    John Hicks is a 71 y.o. male with medical history significant of , chronic combined HFpEF and HFrEF (LVEF 45 to 50%, LV with global hypokinesis, moderate LVH, G1 DD-06/2022), MDS, atrial fibrillation, infrarenal AAA status post endovascular stenting in 2020, essential hypertension who presents to the emergency department due to 3-day onset of diarrhea associated with weakness.  He was admitted for evaluation of acute diarrhea as well as UTI and started on IV cefepime.  CT demonstrating prostatic mass with affect to the base of the bladder, but no difficulty noted with urination.  Patient noted to have worsening anemia with recent chemotherapy initiation for MDS and will require 2 unit PRBC transfusion.  Hematology consulted for further evaluation while inpatient.  Assessment & Plan:   Principal Problem:   UTI (urinary tract infection) Active Problems:   AAA (abdominal aortic aneurysm) (HCC)   Lactic acidosis   Acute diarrhea   Elevated troponin   Macrocytic anemia   Leukopenia   Thrombocytopenia (HCC)   Hypokalemia   Prolonged QT interval   Prostatic mass   Atrial fibrillation, chronic (HCC)   Acquired hypothyroidism   Chronic combined systolic and diastolic CHF (congestive heart failure) (HCC)  Assessment and Plan:   UTI POA Patient was empirically started on IV ceftriaxone Urine culture done on 10/21/2021 was positive for Enterobacter cloacae which was sensitive to cefepime We shall continue with IV cefepime Urine culture pending and blood cultures with no growth to date   Lactic acidosis-resolved   Acute diarrhea possibly related to recent chemotherapy Correct electrolyte abnormalities C. difficile negative and GI stool panel pending   Elevated troponin possibly secondary to type II demand ischemia Troponin 30 > 26.  Troponin already  flattened.  Patient denies chest pain   Pancytopenia with MDS Follows with Dr. Ellin Saba and has port placement with chemotherapy infusion of Vidazza started 6/3 No overt bleeding noted, but stool occult pending Worsening anemia now requiring 2 unit PRBC transfusion Plan to consult with Dr. Ellin Saba   Hypokalemia-persistent K+ 2.8, this will be replenished   Prolonged QT interval QTc 542 milliseconds Avoid QT prolonging drugs Magnesium 2.0 K+ 2.7, this will be replenished Repeat EKG in the morning   Abdominal aortic aneurysm CT abdomen and pelvis showed  AAA 6.5 cm, similar to previous   Prostatic mass CT abdomen and pelvis with contrast showed enlarged prostate with mass effect along the base of the bladder.  Urology can follow-up outpatient, no difficulty with urination noted   Hypoalbuminemia Albumin 3.2, protein supplement will be provided   Atrial fibrillation Continue amiodarone Patient was not on any anticoagulant   Essential hypertension BP meds held at this time due to soft BP   Acquired hypothyroidism Continue Synthroid   Chronic combined HFpEF and HFrEF LVEF 45 to 50%, LV with global hypokinesis, moderate LVH, G1 DD-06/2022 Continue aspirin Lasix and Coreg held at this time due to soft BP    DVT prophylaxis:Lovenox to SCDs Code Status: Full Family Communication: None at bedside Disposition Plan:  Status is: Inpatient Remains inpatient appropriate because: Need for IV medications.   Consultants:  Hematology  Procedures:  None  Antimicrobials:  Anti-infectives (From admission, onward)    Start     Dose/Rate Route Frequency Ordered Stop   11/06/22 2200  ceFEPIme (MAXIPIME) 2 g in sodium chloride 0.9 % 100 mL IVPB  2 g 200 mL/hr over 30 Minutes Intravenous Every 24 hours 11/06/22 2148     11/06/22 1815  cefTRIAXone (ROCEPHIN) 2 g in sodium chloride 0.9 % 100 mL IVPB        2 g 200 mL/hr over 30 Minutes Intravenous  Once 11/06/22 1812  11/06/22 1918      Subjective: Patient seen and evaluated today with some ongoing diarrhea, but no difficulty with urination.  He is otherwise feeling well, but weak and hopeless.  Objective: Vitals:   11/06/22 2130 11/06/22 2235 11/07/22 0158 11/07/22 0538  BP: 98/61 129/68 (!) 90/58 102/61  Pulse: (!) 59  84 87  Resp: 13 20 18 20   Temp:  98.1 F (36.7 C) 100.3 F (37.9 C) 97.6 F (36.4 C)  TempSrc:  Oral Oral   SpO2: 98% 96% 97% 96%  Weight:      Height:        Intake/Output Summary (Last 24 hours) at 11/07/2022 0653 Last data filed at 11/07/2022 0603 Gross per 24 hour  Intake 1425.36 ml  Output 100 ml  Net 1325.36 ml   Filed Weights   11/06/22 1527  Weight: 83.9 kg    Examination:  General exam: Appears calm and comfortable  Respiratory system: Clear to auscultation. Respiratory effort normal. Cardiovascular system: S1 & S2 heard, RRR.  Gastrointestinal system: Abdomen is soft Central nervous system: Alert and awake Extremities: No edema Skin: No significant lesions noted Psychiatry: Flat affect.    Data Reviewed: I have personally reviewed following labs and imaging studies  CBC: Recent Labs  Lab 11/06/22 1530 11/07/22 0448  WBC 2.1* 2.1*  HGB 7.2* 6.6*  HCT 21.6* 20.0*  MCV 107.5* 109.9*  PLT 102* 90*   Basic Metabolic Panel: Recent Labs  Lab 11/06/22 1530 11/06/22 1815 11/07/22 0448  NA 136  --  138  K 2.7*  --  2.8*  CL 104  --  109  CO2 18*  --  16*  GLUCOSE 112*  --  109*  BUN 63*  --  62*  CREATININE 2.61*  --  2.31*  CALCIUM 9.0  --  8.2*  MG  --  2.0 1.9  PHOS  --   --  2.6   GFR: Estimated Creatinine Clearance: 32.7 mL/min (A) (by C-G formula based on SCr of 2.31 mg/dL (H)). Liver Function Tests: Recent Labs  Lab 11/06/22 1530 11/07/22 0448  AST 24 21  ALT 15 14  ALKPHOS 55 41  BILITOT 1.2 0.9  PROT 7.3 6.2*  ALBUMIN 3.2* 2.7*   Recent Labs  Lab 11/06/22 1530  LIPASE 30   No results for input(s): "AMMONIA" in  the last 168 hours. Coagulation Profile: Recent Labs  Lab 11/06/22 1530  INR 1.3*   Cardiac Enzymes: No results for input(s): "CKTOTAL", "CKMB", "CKMBINDEX", "TROPONINI" in the last 168 hours. BNP (last 3 results) No results for input(s): "PROBNP" in the last 8760 hours. HbA1C: No results for input(s): "HGBA1C" in the last 72 hours. CBG: No results for input(s): "GLUCAP" in the last 168 hours. Lipid Profile: No results for input(s): "CHOL", "HDL", "LDLCALC", "TRIG", "CHOLHDL", "LDLDIRECT" in the last 72 hours. Thyroid Function Tests: No results for input(s): "TSH", "T4TOTAL", "FREET4", "T3FREE", "THYROIDAB" in the last 72 hours. Anemia Panel: Recent Labs    11/07/22 0448  VITAMINB12 410   Sepsis Labs: Recent Labs  Lab 11/06/22 1608 11/06/22 1815  LATICACIDVEN 2.3* 1.2    Recent Results (from the past 240 hour(s))  C Difficile Quick  Screen w PCR reflex     Status: None   Collection Time: 11/06/22  8:35 PM   Specimen: STOOL  Result Value Ref Range Status   C Diff antigen NEGATIVE NEGATIVE Final   C Diff toxin NEGATIVE NEGATIVE Final   C Diff interpretation No C. difficile detected.  Final    Comment: Performed at [facility], [address], [place], [place] [address]         Radiology Studies: DG Chest Portable 1 View  Result Date: 11/06/2022 CLINICAL DATA:  Cough. EXAM: PORTABLE CHEST 1 VIEW COMPARISON:  Chest radiograph dated 10/12/2022. FINDINGS: Right-sided Port-A-Cath in similar position. No focal consolidation, pleural effusion, or pneumothorax. Stable cardiomegaly. Atherosclerotic calcification of the aortic arch. No acute osseous pathology. IMPRESSION: 1. No active disease. 2. Cardiomegaly. Electronically Signed   By: Elgie Collard M.D.   On: 11/06/2022 19:34   CT ABDOMEN PELVIS WO CONTRAST  Result Date: 11/06/2022 CLINICAL DATA:  Diarrhea, lower abdominal pain. Hypotension with sepsis. Patient has leukemia. EXAM: CT ABDOMEN AND PELVIS WITHOUT CONTRAST  TECHNIQUE: Multidetector CT imaging of the abdomen and pelvis was performed following the standard protocol without IV contrast. RADIATION DOSE REDUCTION: This exam was performed according to the departmental dose-optimization program which includes automated exposure control, adjustment of the mA and/or kV according to patient size and/or use of iterative reconstruction technique. COMPARISON:  CT 08/23/2022 without contrast FINDINGS: Lower chest: Heart is enlarged. Coronary artery calcifications are seen. There is some linear opacity at the lung bases likely scar or atelectasis. Tiny left effusion. Mildly patulous esophagus. Hepatobiliary: Stones in the gallbladder. On this non IV contrast exam, the liver parenchyma is grossly preserved. Pancreas: Mild pancreatic atrophy. Spleen: Spleen is enlarged with cephalocaudal length of 13.9 cm. Spleen is slightly heterogeneous. Adrenals/Urinary Tract: Slight nonspecific nodular thickening of the adrenal glands. Focal area on the right side measures 12 mm on series 3, image 22 measuring Hounsfield unit of 4, a benign adenoma. Numerous bilateral small renal cysts are identified again Bosniak 1 and 2 lesions. No specific imaging follow-up. No obstructing renal or ureteral stones. Slight ectasia of the renal collecting systems and ureters down to the bladder. The bladder wall is diffusely slightly thickened with some trabeculation and there is an enlarged prostate. Stomach/Bowel: No oral contrast. Stomach is underdistended. Small bowel is nondilated. Slight wall thickening along the sigmoid colon but the bowel is underdistended. Slight adjacent stranding. Please correlate for any clinical evidence of colitis. No proximal obstruction. Appendix is poorly seen in the right lower quadrant but no pericecal stranding or fluid. Vascular/Lymphatic: Aortic endograft in place, incompletely evaluated on this noncontrast examination. Diameter of the aneurysm previously was measured at 6.5  cm and today when measured in the same fashion as the prior measures 6.5 cm. Dedicated workup as clinically appropriate. Reproductive: Enlarged prostate with mass effect along the base of the bladder. Other: No free intra-air.  Small fat containing inguinal hernias. Musculoskeletal: Curvature of the spine with degenerative changes. Multilevel stenosis. IMPRESSION: Subtle wall thickening along the sigmoid colon with stranding. A subtle colitis is possible. No obstruction or free air. No free fluid. Ectatic renal collecting systems dome of the bladder. The bladder wall is thickened and trabeculated and there is a large prostate with mass effect along the base of the bladder. No obstructing stones. Enlarged heart. Gallstones.  Gallbladder is nondilated. Splenomegaly. Aortic endograft with the abdominal aortic aneurysm 6.5 cm, similar to previous. Overall the endograft in the aneurysm is incompletely evaluated on  this noncontrast exam. If needed dedicated endograft protocol postcontrast study could be considered if needed clinically Electronically Signed   By: Karen Kays M.D.   On: 11/06/2022 19:31        Scheduled Meds:  amiodarone  200 mg Oral Q1400   aspirin  81 mg Oral Q1400   enoxaparin (LOVENOX) injection  30 mg Subcutaneous Q24H   feeding supplement  237 mL Oral BID BM   levothyroxine  25 mcg Oral Q1400   Continuous Infusions:  ceFEPime (MAXIPIME) IV 2 g (11/06/22 2231)     LOS: 1 day    Time spent: 35 minutes    Mckinsey Keagle Hoover Brunette, DO [facility]  If 7PM-7AM, please contact night-coverage [email or web address] 11/07/2022, 6:53 AM

## 2022-11-07 NOTE — Progress Notes (Signed)
Patient denied unmet needs.

## 2022-11-08 DIAGNOSIS — N3000 Acute cystitis without hematuria: Secondary | ICD-10-CM | POA: Diagnosis not present

## 2022-11-08 DIAGNOSIS — E876 Hypokalemia: Secondary | ICD-10-CM | POA: Diagnosis not present

## 2022-11-08 DIAGNOSIS — I714 Abdominal aortic aneurysm, without rupture, unspecified: Secondary | ICD-10-CM

## 2022-11-08 DIAGNOSIS — R9431 Abnormal electrocardiogram [ECG] [EKG]: Secondary | ICD-10-CM | POA: Diagnosis not present

## 2022-11-08 DIAGNOSIS — N4289 Other specified disorders of prostate: Secondary | ICD-10-CM | POA: Diagnosis not present

## 2022-11-08 LAB — BPAM RBC
Blood Product Expiration Date: 202407202359
ISSUE DATE / TIME: 202406181014
ISSUE DATE / TIME: 202406181409
Unit Type and Rh: 5100

## 2022-11-08 LAB — BASIC METABOLIC PANEL
Anion gap: 7 (ref 5–15)
BUN: 60 mg/dL — ABNORMAL HIGH (ref 8–23)
CO2: 19 mmol/L — ABNORMAL LOW (ref 22–32)
Calcium: 8.2 mg/dL — ABNORMAL LOW (ref 8.9–10.3)
Chloride: 114 mmol/L — ABNORMAL HIGH (ref 98–111)
Creatinine, Ser: 1.89 mg/dL — ABNORMAL HIGH (ref 0.61–1.24)
GFR, Estimated: 38 mL/min — ABNORMAL LOW (ref >60)
Glucose, Bld: 97 mg/dL (ref 70–99)
Potassium: 3.4 mmol/L — ABNORMAL LOW (ref 3.5–5.1)
Sodium: 140 mmol/L (ref 135–145)

## 2022-11-08 LAB — TYPE AND SCREEN
ABO/RH(D): O POS
Antibody Screen: NEGATIVE
Unit division: 0

## 2022-11-08 LAB — CBC
HCT: 23.8 % — ABNORMAL LOW (ref 39.0–52.0)
Hemoglobin: 7.8 g/dL — ABNORMAL LOW (ref 13.0–17.0)
MCH: 33.1 pg (ref 26.0–34.0)
MCHC: 32.8 g/dL (ref 30.0–36.0)
MCV: 100.8 fL — ABNORMAL HIGH (ref 80.0–100.0)
Platelets: 85 10*3/uL — ABNORMAL LOW (ref 150–400)
RBC: 2.36 MIL/uL — ABNORMAL LOW (ref 4.22–5.81)
RDW: 21.7 % — ABNORMAL HIGH (ref 11.5–15.5)
WBC: 2.3 10*3/uL — ABNORMAL LOW (ref 4.0–10.5)
nRBC: 0 % (ref 0.0–0.2)

## 2022-11-08 LAB — CULTURE, BLOOD (ROUTINE X 2)

## 2022-11-08 LAB — MAGNESIUM: Magnesium: 2.1 mg/dL (ref 1.7–2.4)

## 2022-11-08 MED ORDER — ALPRAZOLAM 1 MG PO TABS
1.0000 mg | ORAL_TABLET | Freq: Three times a day (TID) | ORAL | Status: DC | PRN
  Administered 2022-11-08 (×2): 1 mg via ORAL
  Filled 2022-11-08 (×2): qty 1

## 2022-11-08 MED ORDER — LOPERAMIDE HCL 2 MG PO CAPS: 2.0000 mg | ORAL_CAPSULE | Freq: Four times a day (QID) | ORAL | Status: DC | PRN

## 2022-11-08 NOTE — Progress Notes (Signed)
PROGRESS NOTE    John Hicks  UEA:[id number] DOB: 07-02-1950 DOA: 11/06/2022 PCP: Elfredia Nevins, MD   Brief Narrative:    John Hicks is a 72 y.o. male with medical history significant of , chronic combined HFpEF and HFrEF (LVEF 45 to 50%, LV with global hypokinesis, moderate LVH, G1 DD-06/2022), MDS, atrial fibrillation, infrarenal AAA status post endovascular stenting in 2020, essential hypertension who presents to the emergency department due to 3-day onset of diarrhea associated with weakness.  He was admitted for evaluation of acute diarrhea as well as UTI and started on IV cefepime.  CT demonstrating prostatic mass with affect to the base of the bladder, but no difficulty noted with urination.  Patient noted to have worsening anemia with recent chemotherapy initiation for MDS and will require 2 unit PRBC transfusion.  Hematology consulted for further evaluation while inpatient.  Assessment & Plan:   Principal Problem:   UTI (urinary tract infection) Active Problems:   AAA (abdominal aortic aneurysm) (HCC)   Lactic acidosis   Diarrhea   Elevated troponin   Macrocytic anemia   Leukopenia   Thrombocytopenia (HCC)   Hypokalemia   Prolonged QT interval   Prostatic mass   Atrial fibrillation, chronic (HCC)   Acquired hypothyroidism   Chronic combined systolic and diastolic CHF (congestive heart failure) (HCC)  Assessment and Plan:  E. coli UTI POA -Patient was empirically started on IV ceftriaxone -Urine culture done on 10/21/2021 was positive for Enterobacter cloacae which was sensitive to cefepime in the past. -Follow culture sensitivity and transition regimen to oral route to complete therapy -Anticipate discharge home on 11/09/2022   Lactic acidosis -resolved -Maintain adequate hydration.   Acute diarrhea possibly related to recent chemotherapy -Continue to maintain adequate hydration -C. difficile negative and GI stool panel also negative. -Continue as needed use  of loperamide   Elevated troponin possibly secondary to type II demand ischemia. -Troponin 30 > 26.  Troponin already flattened.   -Patient denies chest pain -Continue patient follow-up with cardiology service.   Pancytopenia with MDS Follows with Dr. Ellin Saba and has port placement with chemotherapy infusion of Vidazza started 6/3 No overt bleeding noted, but stool occult pending Worsening anemia now requiring 2 unit PRBC transfusion Plan to consult with Dr. Ellin Saba   Hypokalemia-persistent -Repleted -Continue daily supplementation due to ongoing GI losses.   Prolonged QT interval -QTc 542 milliseconds -Continue to avoid QT prolonging agents -Maintain magnesium and potassium within normal limits (goal is for potassium above 4 and magnesium above 2 as much as possible.   Abdominal aortic aneurysm -CT abdomen and pelvis showed  AAA 6.5 cm, similar to previous findings. -Continue patient follow-up with vascular surgery.   Prostatic mass -CT abdomen and pelvis with contrast showed enlarged prostate with mass effect along the base of the bladder.  -Outpatient follow-up with urology service recommended;  difficulty with urination reported -Patient denying dysuria.   Hypoalbuminemia -Albumin 3.2, -Protein supplementation discussed with patient. -Maintain adequate hydration.   History of atrial fibrillation -Currently stable; continue amiodarone -Patient found not to be a candidate for anticoagulation -Continue patient follow-up with cardiology service.   Essential hypertension -Blood pressure currently soft/stable -Continue holding home antihypertensive agents -Heart healthy diet discussed with patient.   Acquired hypothyroidism -continue Synthroid.   Chronic combined HFpEF and HFrEF -LVEF 45 to 50%, LV with global hypokinesis, moderate LVH, G1 DD-06/2022 -Compensated -Planning to resume the use of beta-blocker and as needed Lasix at time of discharge -Daily  weights and  low-sodium diet discussed with patient.    DVT prophylaxis:Lovenox to SCDs Code Status: Full Family Communication: None at bedside Disposition Plan:  Status is: Inpatient Remains inpatient appropriate because: Need for IV medications.   Consultants:  Hematology  Procedures:  None  Antimicrobials:  Anti-infectives (From admission, onward)    Start     Dose/Rate Route Frequency Ordered Stop   11/06/22 2200  ceFEPIme (MAXIPIME) 2 g in sodium chloride 0.9 % 100 mL IVPB        2 g 200 mL/hr over 30 Minutes Intravenous Every 24 hours 11/06/22 2148     11/06/22 1815  cefTRIAXone (ROCEPHIN) 2 g in sodium chloride 0.9 % 100 mL IVPB        2 g 200 mL/hr over 30 Minutes Intravenous  Once 11/06/22 1812 11/06/22 1918      Subjective: No fever, no chest pain, no nausea or vomiting; still with ongoing diarrhea (reporting decrease in the amount of loose stools); denies dysuria.  Objective: Vitals:   11/07/22 1730 11/07/22 2132 11/08/22 0455 11/08/22 1424  BP: 118/71 112/68 117/66 113/76  Pulse: 72 61 (!) 55 68  Resp: 16 20 18 20   Temp: 97.7 F (36.5 C) 98.6 F (37 C) 98.4 F (36.9 C) 97.7 F (36.5 C)  TempSrc: Oral   Oral  SpO2:  99% 98% 100%  Weight:      Height:        Intake/Output Summary (Last 24 hours) at 11/08/2022 1828 Last data filed at 11/08/2022 1730 Gross per 24 hour  Intake 960 ml  Output --  Net 960 ml   Filed Weights   11/06/22 1527  Weight: 83.9 kg    Examination:  General exam: Afebrile, no chest pain, no nausea or vomiting; feeling weak/tired and is still having some ongoing loose stools. Respiratory system: Good room, bilaterally; no using accessory muscles.  Good saturation on room air. Cardiovascular system: RRR; no rubs, no gallops, no JVD. Gastrointestinal system: Soft, nontender, nondistended, positive bowel sounds. Central nervous system: No focal neurologic deficit appreciated; generally weak. Extremities: No cyanosis or  clubbing, no edema Skin: No petechiae. Psychiatry: Following commands appropriately; patient alert, awake and oriented x 3.    Data Reviewed: I have personally reviewed following labs and imaging studies  CBC: Recent Labs  Lab 11/06/22 1530 11/07/22 0448 11/07/22 0704 11/08/22 0540  WBC 2.1* 2.1*  --  2.3*  HGB 7.2* 6.6* 5.9* 7.8*  HCT 21.6* 20.0* 17.7* 23.8*  MCV 107.5* 109.9*  --  100.8*  PLT 102* 90*  --  85*   Basic Metabolic Panel: Recent Labs  Lab 11/06/22 1530 11/06/22 1815 11/07/22 0448 11/08/22 0540  NA 136  --  138 140  K 2.7*  --  2.8* 3.4*  CL 104  --  109 114*  CO2 18*  --  16* 19*  GLUCOSE 112*  --  109* 97  BUN 63*  --  62* 60*  CREATININE 2.61*  --  2.31* 1.89*  CALCIUM 9.0  --  8.2* 8.2*  MG  --  2.0 1.9 2.1  PHOS  --   --  2.6  --    GFR: Estimated Creatinine Clearance: 39.9 mL/min (A) (by C-G formula based on SCr of 1.89 mg/dL (H)).  Liver Function Tests: Recent Labs  Lab 11/06/22 1530 11/07/22 0448  AST 24 21  ALT 15 14  ALKPHOS 55 41  BILITOT 1.2 0.9  PROT 7.3 6.2*  ALBUMIN 3.2* 2.7*   Recent Labs  Lab 11/06/22 1530  LIPASE 30   Coagulation Profile: Recent Labs  Lab 11/06/22 1530  INR 1.3*   Anemia Panel: Recent Labs    11/07/22 0448 11/07/22 0817  VITAMINB12 410  --   FOLATE 25.1  --   RETICCTPCT  --  1.1   Sepsis Labs: Recent Labs  Lab 11/06/22 1608 11/06/22 1815  LATICACIDVEN 2.3* 1.2    Recent Results (from the past 240 hour(s))  Urine Culture     Status: Abnormal (Preliminary result)   Collection Time: 11/06/22  3:30 PM   Specimen: Urine, Clean Catch  Result Value Ref Range Status   Specimen Description   Final    URINE, CLEAN CATCH Performed at [facility], [address] [place], [place] [address]    Special Requests   Final    NONE Performed at [facility], [address], [place], [place] [address]    Culture (A)  Final    >=100,000 COLONIES/mL ESCHERICHIA COLI SUSCEPTIBILITIES TO  FOLLOW Performed at [facility], [address] [place], [place] [address]    Report Status PENDING  Incomplete  Blood culture (routine x 2)     Status: None (Preliminary result)   Collection Time: 11/06/22  4:08 PM   Specimen: BLOOD  Result Value Ref Range Status   Specimen Description BLOOD RIGHT ASSIST CONTROL  Final   Special Requests   Final    BOTTLES DRAWN AEROBIC AND ANAEROBIC Blood Culture results may not be optimal due to an excessive volume of blood received in culture bottles   Culture   Final    NO GROWTH 2 DAYS Performed at [facility], [address], [place], [place] [address]    Report Status PENDING  Incomplete  Blood culture (routine x 2)     Status: None (Preliminary result)   Collection Time: 11/06/22  4:11 PM   Specimen: BLOOD  Result Value Ref Range Status   Specimen Description BLOOD RIGHT FATTY CASTS  Final   Special Requests   Final    BOTTLES DRAWN AEROBIC AND ANAEROBIC Blood Culture adequate volume   Culture   Final    NO GROWTH 2 DAYS Performed at [facility], [address] [place], [place] [address]    Report Status PENDING  Incomplete  C Difficile Quick Screen w PCR reflex     Status: None   Collection Time: 11/06/22  8:35 PM   Specimen: STOOL  Result Value Ref Range Status   C Diff antigen NEGATIVE NEGATIVE Final   C Diff toxin NEGATIVE NEGATIVE Final   C Diff interpretation No C. difficile detected.  Final    Comment: Performed at [facility], [address], [place], [place] [address]  Gastrointestinal Panel by PCR , Stool     Status: None   Collection Time: 11/06/22  8:35 PM   Specimen: STOOL  Result Value Ref Range Status   Campylobacter species NOT DETECTED NOT DETECTED Final   Plesimonas shigelloides NOT DETECTED NOT DETECTED Final   Salmonella species NOT DETECTED NOT DETECTED Final   Yersinia enterocolitica NOT DETECTED NOT DETECTED Final   Vibrio species NOT DETECTED NOT DETECTED Final   Vibrio cholerae NOT DETECTED NOT  DETECTED Final   Enteroaggregative E coli (EAEC) NOT DETECTED NOT DETECTED Final   Enteropathogenic E coli (EPEC) NOT DETECTED NOT DETECTED Final   Enterotoxigenic E coli (ETEC) NOT DETECTED NOT DETECTED Final   Shiga like toxin producing E coli (STEC) NOT DETECTED NOT DETECTED Final  Shigella/Enteroinvasive E coli (EIEC) NOT DETECTED NOT DETECTED Final   Cryptosporidium NOT DETECTED NOT DETECTED Final   Cyclospora cayetanensis NOT DETECTED NOT DETECTED Final   Entamoeba histolytica NOT DETECTED NOT DETECTED Final   Giardia lamblia NOT DETECTED NOT DETECTED Final   Adenovirus F40/41 NOT DETECTED NOT DETECTED Final   Astrovirus NOT DETECTED NOT DETECTED Final   Norovirus GI/GII NOT DETECTED NOT DETECTED Final   Rotavirus A NOT DETECTED NOT DETECTED Final   Sapovirus (I, II, IV, and V) NOT DETECTED NOT DETECTED Final    Comment: Performed at [facility], [address], [place], [place] [address]    Radiology Studies: DG Chest Portable 1 View  Result Date: 11/06/2022 CLINICAL DATA:  Cough. EXAM: PORTABLE CHEST 1 VIEW COMPARISON:  Chest radiograph dated 10/12/2022. FINDINGS: Right-sided Port-A-Cath in similar position. No focal consolidation, pleural effusion, or pneumothorax. Stable cardiomegaly. Atherosclerotic calcification of the aortic arch. No acute osseous pathology. IMPRESSION: 1. No active disease. 2. Cardiomegaly. Electronically Signed   By: Elgie Collard M.D.   On: 11/06/2022 19:34   CT ABDOMEN PELVIS WO CONTRAST  Result Date: 11/06/2022 CLINICAL DATA:  Diarrhea, lower abdominal pain. Hypotension with sepsis. Patient has leukemia. EXAM: CT ABDOMEN AND PELVIS WITHOUT CONTRAST TECHNIQUE: Multidetector CT imaging of the abdomen and pelvis was performed following the standard protocol without IV contrast. RADIATION DOSE REDUCTION: This exam was performed according to the departmental dose-optimization program which includes automated exposure control, adjustment of the mA  and/or kV according to patient size and/or use of iterative reconstruction technique. COMPARISON:  CT 08/23/2022 without contrast FINDINGS: Lower chest: Heart is enlarged. Coronary artery calcifications are seen. There is some linear opacity at the lung bases likely scar or atelectasis. Tiny left effusion. Mildly patulous esophagus. Hepatobiliary: Stones in the gallbladder. On this non IV contrast exam, the liver parenchyma is grossly preserved. Pancreas: Mild pancreatic atrophy. Spleen: Spleen is enlarged with cephalocaudal length of 13.9 cm. Spleen is slightly heterogeneous. Adrenals/Urinary Tract: Slight nonspecific nodular thickening of the adrenal glands. Focal area on the right side measures 12 mm on series 3, image 22 measuring Hounsfield unit of 4, a benign adenoma. Numerous bilateral small renal cysts are identified again Bosniak 1 and 2 lesions. No specific imaging follow-up. No obstructing renal or ureteral stones. Slight ectasia of the renal collecting systems and ureters down to the bladder. The bladder wall is diffusely slightly thickened with some trabeculation and there is an enlarged prostate. Stomach/Bowel: No oral contrast. Stomach is underdistended. Small bowel is nondilated. Slight wall thickening along the sigmoid colon but the bowel is underdistended. Slight adjacent stranding. Please correlate for any clinical evidence of colitis. No proximal obstruction. Appendix is poorly seen in the right lower quadrant but no pericecal stranding or fluid. Vascular/Lymphatic: Aortic endograft in place, incompletely evaluated on this noncontrast examination. Diameter of the aneurysm previously was measured at 6.5 cm and today when measured in the same fashion as the prior measures 6.5 cm. Dedicated workup as clinically appropriate. Reproductive: Enlarged prostate with mass effect along the base of the bladder. Other: No free intra-air.  Small fat containing inguinal hernias. Musculoskeletal: Curvature of  the spine with degenerative changes. Multilevel stenosis. IMPRESSION: Subtle wall thickening along the sigmoid colon with stranding. A subtle colitis is possible. No obstruction or free air. No free fluid. Ectatic renal collecting systems dome of the bladder. The bladder wall is thickened and trabeculated and there is a large prostate with mass effect along the base of the bladder.  No obstructing stones. Enlarged heart. Gallstones.  Gallbladder is nondilated. Splenomegaly. Aortic endograft with the abdominal aortic aneurysm 6.5 cm, similar to previous. Overall the endograft in the aneurysm is incompletely evaluated on this noncontrast exam. If needed dedicated endograft protocol postcontrast study could be considered if needed clinically Electronically Signed   By: Karen Kays M.D.   On: 11/06/2022 19:31     Scheduled Meds:  amiodarone  200 mg Oral BID   feeding supplement  237 mL Oral BID BM   levothyroxine  25 mcg Oral Q1400   potassium chloride SA  40 mEq Oral BID   Continuous Infusions:  ceFEPime (MAXIPIME) IV 2 g (11/07/22 2215)     LOS: 2 days    Time spent: 35 minutes    Addalynne Golding,MD [facility]  If 7PM-7AM, please contact night-coverage [email or web address] 11/08/2022, 6:28 PM

## 2022-11-08 NOTE — Progress Notes (Signed)
Mobility Specialist Progress Note:    11/08/22 1104  Mobility  Activity Transferred to/from [facility];Ambulated with assistance in room  Level of Assistance Standby assist, set-up cues, supervision of patient - no hands on  Assistive Device [employer] Ambulated (ft) 10 ft  Range of Motion/Exercises Active;All extremities  Activity Response Tolerated well  Mobility Referral Yes  $Mobility charge 1 Mobility  Mobility Specialist Start Time (ACUTE ONLY) 1050  Mobility Specialist Stop Time (ACUTE ONLY) 1104  Mobility Specialist Time Calculation (min) (ACUTE ONLY) 14 min   Responded to pt bed alarm, pt had transferred to [facility] using cane. Supervised pt returning to bed and ambulating in room. Tolerated mobility well, asx throughout. Left pt at EOB, all needs met.   Feliciana Rossetti Mobility Specialist Please contact via Special educational needs teacher or  Rehab office at [phone]

## 2022-11-08 NOTE — Plan of Care (Signed)

## 2022-11-09 DIAGNOSIS — N3000 Acute cystitis without hematuria: Secondary | ICD-10-CM | POA: Diagnosis not present

## 2022-11-09 DIAGNOSIS — I714 Abdominal aortic aneurysm, without rupture, unspecified: Secondary | ICD-10-CM | POA: Diagnosis not present

## 2022-11-09 DIAGNOSIS — I482 Chronic atrial fibrillation, unspecified: Secondary | ICD-10-CM | POA: Diagnosis not present

## 2022-11-09 DIAGNOSIS — E039 Hypothyroidism, unspecified: Secondary | ICD-10-CM | POA: Diagnosis not present

## 2022-11-09 LAB — URINE CULTURE: Culture: >=100000 — AB

## 2022-11-09 MED ORDER — FUROSEMIDE 20 MG PO TABS: 40.0000 mg | ORAL_TABLET | Freq: Every day | ORAL | Status: DC | PRN

## 2022-11-09 MED ORDER — POTASSIUM CHLORIDE CRYS ER 20 MEQ PO TBCR: 40.0000 meq | EXTENDED_RELEASE_TABLET | Freq: Every day | ORAL | 0 refills | Status: DC

## 2022-11-09 MED ORDER — LOPERAMIDE HCL 2 MG PO CAPS: 2.0000 mg | ORAL_CAPSULE | Freq: Four times a day (QID) | ORAL | 0 refills | Status: DC | PRN

## 2022-11-09 MED ORDER — CEFDINIR 300 MG PO CAPS: 300.0000 mg | ORAL_CAPSULE | Freq: Two times a day (BID) | ORAL | 0 refills | Status: AC

## 2022-11-09 NOTE — Discharge Summary (Signed)
Physician Discharge Summary   Patient: John Hicks MRN: [id number] DOB: 09-26-1950  Admit date:     11/06/2022  Discharge date: 11/09/22  Discharge Physician: Vassie Loll   PCP: Elfredia Nevins, MD   Recommendations at discharge:  Repeat basic metabolic panel to follow electrolytes and renal function Reassess blood pressure and adjust antihypertensive regimen as needed Make sure patient has follow-up with urology service as instructed. Continue outpatient follow-up/monitoring of abdominal aortic aneurysm (vascular surgery referral if needed). Repeat CBC to follow hemoglobin trend/stability.  Discharge Diagnoses: Principal Problem:   UTI (urinary tract infection) Active Problems:   AAA (abdominal aortic aneurysm) (HCC)   Lactic acidosis   Diarrhea   Elevated troponin   Macrocytic anemia   Leukopenia   Thrombocytopenia (HCC)   Hypokalemia   Prolonged QT interval   Prostatic mass   Atrial fibrillation, chronic (HCC)   Acquired hypothyroidism   Chronic combined systolic and diastolic CHF (congestive heart failure) [facility])  Brief Hospital admission narrative: John Hicks is a 72 y.o. male with medical history significant of , chronic combined HFpEF and HFrEF (LVEF 45 to 50%, LV with global hypokinesis, moderate LVH, G1 DD-06/2022), MDS, atrial fibrillation, infrarenal AAA status post endovascular stenting in 2020, essential hypertension who presents to the emergency department due to 3-day onset of diarrhea associated with weakness.  He was admitted for evaluation of acute diarrhea as well as UTI and started on IV cefepime.  CT demonstrating prostatic mass with affect to the base of the bladder, but no difficulty noted with urination.  Patient noted to have worsening anemia with recent chemotherapy initiation for MDS and will require 2 unit PRBC transfusion.  Hematology consulted for further evaluation while inpatient.   Assessment and Plan: E. coli UTI POA -Patient was  empirically started on IV ceftriaxone -Urine culture done on 10/21/2021 was positive for Enterobacter cloacae which was sensitive to cefepime in the past. -Culture results demonstrating pansensitive microorganism -Patient has been discharged home on oral cefdinir to complete antibiotic therapy.   Lactic acidosis -resolved -Patient advised to maintain adequate hydration.   Acute diarrhea possibly related to recent chemotherapy -Continue to maintain adequate hydration -C. difficile negative and GI stool panel also negative. -Continue as needed use of loperamide   Elevated troponin possibly secondary to type II demand ischemia. -Troponin 30 > 26.  Troponin already flattened.   -Patient denies chest pain -Continue outpatient follow-up with cardiology service.   Pancytopenia with MDS -Follows with Dr. Ellin Saba and has port placement with chemotherapy infusion of Vidazza started 6/3 -No overt bleeding noted -Worsening anemia requiring 2 unit PRBC transfusion -Appears to be associated with chemotherapy -Continue to follow hemoglobin trend as an outpatient -After transfusion patient's levels stable.   Hypokalemia-persistent -Repleted and daily supplementation has been started. -Continue daily supplementation due to ongoing GI losses.   Prolonged QT interval -QTc 542 milliseconds -Continue to avoid QT prolonging agents -Continue to maintain magnesium and potassium within normal limits (goal is for potassium above 4 and magnesium above 2 as much as possible).   Abdominal aortic aneurysm -CT abdomen and pelvis showed  AAA 6.5 cm, similar to previous findings. -Continue patient follow-up with vascular surgery.   Prostatic mass -CT abdomen and pelvis with contrast showed enlarged prostate with mass effect along the base of the bladder.  -Outpatient follow-up with urology service recommended;  difficulty with urination reported -Patient denying dysuria.   Hypoalbuminemia -Albumin  3.2, -Protein supplementation discussed with patient. -Maintain adequate hydration.   History  of atrial fibrillation -Currently stable; continue amiodarone -Patient found not to be a candidate for anticoagulation -Continue patient follow-up with cardiology service. -Continue enteric-coated aspirin on daily basis.   Essential hypertension -Blood pressure currently soft/stable -Adjusted dosages and instructions -Reassess blood pressure at follow-up visit with PCP. -Heart healthy diet discussed with patient.   Acquired hypothyroidism -continue Synthroid. -Continue to follow thyroid panel intermittently as an outpatient.   Chronic combined HFpEF and HFrEF -LVEF 45 to 50%, LV with global hypokinesis, moderate LVH, G1 DD-06/2022 -Compensated and stable. -Planning to resume the use of beta-blocker and as needed Lasix at time of discharge. -Daily weights and low-sodium diet discussed with patient. -Continue follow-up with cardiology service as an outpatient. -Patient instructed to follow low-sodium diet, to maintain adequate hydration and check weight on daily basis.   Consultants: Hematology/oncology service Procedures performed: See below for x-ray reports Disposition: Home Diet recommendation: Heart healthy/low-sodium diet.  DISCHARGE MEDICATION: Allergies as of 11/09/2022       Reactions   Codeine Other (See Comments)   agitation        Medication List     TAKE these medications    ALPRAZolam 1 MG tablet Commonly known as: XANAX Take 1 mg by mouth 3 (three) times daily as needed for anxiety.   amiodarone 200 MG tablet Commonly known as: PACERONE Take 200 mg by mouth daily at 2 PM.   aspirin 81 MG chewable tablet Chew 81 mg by mouth daily at 2 PM.   azaCITIDine 5 mg/2 mLs in lactated ringers infusion Inject into the vein every 28 (twenty-eight) days. Days 1-7 every 28 days   carvedilol 6.25 MG tablet Commonly known as: COREG Take 1 tablet (6.25 mg total) by  mouth 2 (two) times daily.   cefdinir 300 MG capsule Commonly known as: OMNICEF Take 1 capsule (300 mg total) by mouth 2 (two) times daily for 5 days.   CoQ10 200 MG Caps Take 200 mg by mouth daily at 2 PM.   Flaxseed Oil 1000 MG Caps Take 1,000 mg by mouth daily at 2 PM.   folic acid 1 MG tablet Commonly known as: FOLVITE Take 1 mg by mouth daily at 2 PM. One daily   furosemide 20 MG tablet Commonly known as: LASIX Take 2 tablets (40 mg total) by mouth daily as needed for fluid or edema. 1400 & bedtime What changed:  when to take this reasons to take this   levothyroxine 25 MCG tablet Commonly known as: SYNTHROID Take 25 mcg by mouth daily at 2 PM.   lidocaine-prilocaine cream Commonly known as: EMLA Apply 1 Application topically as needed (Apply to port site 1 hour prior to access).   loperamide 2 MG capsule Commonly known as: IMODIUM Take 1 capsule (2 mg total) by mouth every 6 (six) hours as needed for diarrhea or loose stools.   ondansetron 4 MG tablet Commonly known as: Zofran Take 1 tablet (4 mg total) by mouth every 8 (eight) hours as needed.   potassium chloride SA 20 MEQ tablet Commonly known as: KLOR-CON M Take 2 tablets (40 mEq total) by mouth daily.   prochlorperazine 10 MG tablet Commonly known as: COMPAZINE Take 1 tablet (10 mg total) by mouth every 6 (six) hours as needed for nausea or vomiting.   tamsulosin 0.4 MG Caps capsule Commonly known as: FLOMAX Take 0.4 mg by mouth daily at 2 PM.   Vitamin D3 50 MCG (2000 UT) capsule Take 2,000 Units by mouth daily at 2  PM. 1400        Follow-up Information     Elfredia Nevins, MD. Schedule an appointment as soon as possible for a visit in 10 day(s).   Specialty: Internal Medicine Contact information: [address] [place] [address] [phone]         Malen Gauze, MD. Schedule an appointment as soon as possible for a visit in 2 week(s).   Specialty: Urology Contact  information: [address]  [place] [address] [phone]                Discharge Exam: Ceasar Mons Weights   11/06/22 1527  Weight: 83.9 kg   General exam: Alert, awake, afebrile and in no acute distress.  No overnight events. Respiratory system: Clear to auscultation. Respiratory effort normal.  Good saturation on room air Cardiovascular system:RRR. No rubs or gallops; no JVD. Gastrointestinal system: Abdomen is nondistended, soft and nontender. No organomegaly or masses felt. Normal bowel sounds heard. Central nervous system: Alert and oriented. No focal neurological deficits.  Alert, awake and oriented x 3. Extremities: No cyanosis, clubbing or edema. Skin: No petechiae. Psychiatry: Judgement and insight appear normal. Mood & affect appropriate.    Condition at discharge: Stable and improved.  The results of significant diagnostics from this hospitalization (including imaging, microbiology, ancillary and laboratory) are listed below for reference.   Imaging Studies: DG Chest Portable 1 View  Result Date: 11/06/2022 CLINICAL DATA:  Cough. EXAM: PORTABLE CHEST 1 VIEW COMPARISON:  Chest radiograph dated 10/12/2022. FINDINGS: Right-sided Port-A-Cath in similar position. No focal consolidation, pleural effusion, or pneumothorax. Stable cardiomegaly. Atherosclerotic calcification of the aortic arch. No acute osseous pathology. IMPRESSION: 1. No active disease. 2. Cardiomegaly. Electronically Signed   By: Elgie Collard M.D.   On: 11/06/2022 19:34   CT ABDOMEN PELVIS WO CONTRAST  Result Date: 11/06/2022 CLINICAL DATA:  Diarrhea, lower abdominal pain. Hypotension with sepsis. Patient has leukemia. EXAM: CT ABDOMEN AND PELVIS WITHOUT CONTRAST TECHNIQUE: Multidetector CT imaging of the abdomen and pelvis was performed following the standard protocol without IV contrast. RADIATION DOSE REDUCTION: This exam was performed according to the departmental dose-optimization  program which includes automated exposure control, adjustment of the mA and/or kV according to patient size and/or use of iterative reconstruction technique. COMPARISON:  CT 08/23/2022 without contrast FINDINGS: Lower chest: Heart is enlarged. Coronary artery calcifications are seen. There is some linear opacity at the lung bases likely scar or atelectasis. Tiny left effusion. Mildly patulous esophagus. Hepatobiliary: Stones in the gallbladder. On this non IV contrast exam, the liver parenchyma is grossly preserved. Pancreas: Mild pancreatic atrophy. Spleen: Spleen is enlarged with cephalocaudal length of 13.9 cm. Spleen is slightly heterogeneous. Adrenals/Urinary Tract: Slight nonspecific nodular thickening of the adrenal glands. Focal area on the right side measures 12 mm on series 3, image 22 measuring Hounsfield unit of 4, a benign adenoma. Numerous bilateral small renal cysts are identified again Bosniak 1 and 2 lesions. No specific imaging follow-up. No obstructing renal or ureteral stones. Slight ectasia of the renal collecting systems and ureters down to the bladder. The bladder wall is diffusely slightly thickened with some trabeculation and there is an enlarged prostate. Stomach/Bowel: No oral contrast. Stomach is underdistended. Small bowel is nondilated. Slight wall thickening along the sigmoid colon but the bowel is underdistended. Slight adjacent stranding. Please correlate for any clinical evidence of colitis. No proximal obstruction. Appendix is poorly seen in the right lower quadrant but no pericecal stranding or fluid. Vascular/Lymphatic:  Aortic endograft in place, incompletely evaluated on this noncontrast examination. Diameter of the aneurysm previously was measured at 6.5 cm and today when measured in the same fashion as the prior measures 6.5 cm. Dedicated workup as clinically appropriate. Reproductive: Enlarged prostate with mass effect along the base of the bladder. Other: No free intra-air.   Small fat containing inguinal hernias. Musculoskeletal: Curvature of the spine with degenerative changes. Multilevel stenosis. IMPRESSION: Subtle wall thickening along the sigmoid colon with stranding. A subtle colitis is possible. No obstruction or free air. No free fluid. Ectatic renal collecting systems dome of the bladder. The bladder wall is thickened and trabeculated and there is a large prostate with mass effect along the base of the bladder. No obstructing stones. Enlarged heart. Gallstones.  Gallbladder is nondilated. Splenomegaly. Aortic endograft with the abdominal aortic aneurysm 6.5 cm, similar to previous. Overall the endograft in the aneurysm is incompletely evaluated on this noncontrast exam. If needed dedicated endograft protocol postcontrast study could be considered if needed clinically Electronically Signed   By: Karen Kays M.D.   On: 11/06/2022 19:31   DG Chest Port 1 View  Result Date: 10/12/2022 CLINICAL DATA:  72 year old male Port-A-Cath placement. EXAM: PORTABLE CHEST 1 VIEW COMPARISON:  Chest CT 08/23/2022 and earlier. FINDINGS: Portable AP upright view at 0926 hours. Right chest IJ approach Port-A-Cath now in place. The catheter hub is partially obscured by EKG lead artifact. Catheter tip projects at the carina, lower SVC level. No pneumothorax. More lordotic positioning compared to portable chest x-ray in 2020. Allowing for portable technique the lungs are clear. Mediastinal contours remain within normal limits. Visualized tracheal air column is within normal limits. No acute osseous abnormality identified. Paucity of bowel gas in the visible abdomen. IMPRESSION: 1. Right chest IJ approach Port-A-Cath placed with tip at the lower SVC level. No pneumothorax or adverse features. 2. No acute cardiopulmonary abnormality. Electronically Signed   By: Odessa Fleming M.D.   On: 10/12/2022 09:41   DG C-Arm 1-60 Min-No Report  Result Date: 10/12/2022 Fluoroscopy was utilized by the requesting  physician.  No radiographic interpretation.    Microbiology: Results for orders placed or performed during the hospital encounter of 11/06/22  Urine Culture     Status: Abnormal   Collection Time: 11/06/22  3:30 PM   Specimen: Urine, Clean Catch  Result Value Ref Range Status   Specimen Description   Final    URINE, CLEAN CATCH Performed at [facility], [address] [place], [place] [address]    Special Requests   Final    NONE Performed at [facility], [address] [place], [place] [address]    Culture >=100,000 COLONIES/mL ESCHERICHIA COLI (A)  Final   Report Status 11/09/2022 FINAL  Final   Organism ID, Bacteria ESCHERICHIA COLI (A)  Final      Susceptibility   Escherichia coli - MIC*    AMPICILLIN 8 SENSITIVE Sensitive     CEFAZOLIN <=4 SENSITIVE Sensitive     CEFEPIME <=0.12 SENSITIVE Sensitive     CEFTRIAXONE <=0.25 SENSITIVE Sensitive     CIPROFLOXACIN <=0.25 SENSITIVE Sensitive     GENTAMICIN <=1 SENSITIVE Sensitive     IMIPENEM <=0.25 SENSITIVE Sensitive     NITROFURANTOIN <=16 SENSITIVE Sensitive     TRIMETH/SULFA <=20 SENSITIVE Sensitive     AMPICILLIN/SULBACTAM <=2 SENSITIVE Sensitive     PIP/TAZO <=4 SENSITIVE Sensitive     * >=100,000 COLONIES/mL ESCHERICHIA COLI  Blood culture (routine x 2)  Status: None (Preliminary result)   Collection Time: 11/06/22  4:08 PM   Specimen: BLOOD  Result Value Ref Range Status   Specimen Description BLOOD RIGHT ASSIST CONTROL  Final   Special Requests   Final    BOTTLES DRAWN AEROBIC AND ANAEROBIC Blood Culture results may not be optimal due to an excessive volume of blood received in culture bottles   Culture   Final    NO GROWTH 2 DAYS Performed at [facility], [address], [place], [place] [address]    Report Status PENDING  Incomplete  Blood culture (routine x 2)     Status: None (Preliminary result)   Collection Time: 11/06/22  4:11 PM   Specimen: BLOOD  Result Value Ref Range Status   Specimen  Description BLOOD RIGHT FATTY CASTS  Final   Special Requests   Final    BOTTLES DRAWN AEROBIC AND ANAEROBIC Blood Culture adequate volume   Culture   Final    NO GROWTH 2 DAYS Performed at [facility], [address] [place], [place] [address]    Report Status PENDING  Incomplete  C Difficile Quick Screen w PCR reflex     Status: None   Collection Time: 11/06/22  8:35 PM   Specimen: STOOL  Result Value Ref Range Status   C Diff antigen NEGATIVE NEGATIVE Final   C Diff toxin NEGATIVE NEGATIVE Final   C Diff interpretation No C. difficile detected.  Final    Comment: Performed at [facility], [address] [place], [place] [address]  Gastrointestinal Panel by PCR , Stool     Status: None   Collection Time: 11/06/22  8:35 PM   Specimen: STOOL  Result Value Ref Range Status   Campylobacter species NOT DETECTED NOT DETECTED Final   Plesimonas shigelloides NOT DETECTED NOT DETECTED Final   Salmonella species NOT DETECTED NOT DETECTED Final   Yersinia enterocolitica NOT DETECTED NOT DETECTED Final   Vibrio species NOT DETECTED NOT DETECTED Final   Vibrio cholerae NOT DETECTED NOT DETECTED Final   Enteroaggregative E coli (EAEC) NOT DETECTED NOT DETECTED Final   Enteropathogenic E coli (EPEC) NOT DETECTED NOT DETECTED Final   Enterotoxigenic E coli (ETEC) NOT DETECTED NOT DETECTED Final   Shiga like toxin producing E coli (STEC) NOT DETECTED NOT DETECTED Final   Shigella/Enteroinvasive E coli (EIEC) NOT DETECTED NOT DETECTED Final   Cryptosporidium NOT DETECTED NOT DETECTED Final   Cyclospora cayetanensis NOT DETECTED NOT DETECTED Final   Entamoeba histolytica NOT DETECTED NOT DETECTED Final   Giardia lamblia NOT DETECTED NOT DETECTED Final   Adenovirus F40/41 NOT DETECTED NOT DETECTED Final   Astrovirus NOT DETECTED NOT DETECTED Final   Norovirus GI/GII NOT DETECTED NOT DETECTED Final   Rotavirus A NOT DETECTED NOT DETECTED Final   Sapovirus (I, II, IV, and V) NOT DETECTED NOT  DETECTED Final    Comment: Performed at [facility], [address] [place], [place] [address]    Labs: CBC: Recent Labs  Lab 11/06/22 1530 11/07/22 0448 11/07/22 0704 11/08/22 0540  WBC 2.1* 2.1*  --  2.3*  HGB 7.2* 6.6* 5.9* 7.8*  HCT 21.6* 20.0* 17.7* 23.8*  MCV 107.5* 109.9*  --  100.8*  PLT 102* 90*  --  85*   Basic Metabolic Panel: Recent Labs  Lab 11/06/22 1530 11/06/22 1815 11/07/22 0448 11/08/22 0540  NA 136  --  138 140  K 2.7*  --  2.8* 3.4*  CL 104  --  109 114*  CO2 18*  --  16* 19*  GLUCOSE 112*  --  109* 97  BUN 63*  --  62* 60*  CREATININE 2.61*  --  2.31* 1.89*  CALCIUM 9.0  --  8.2* 8.2*  MG  --  2.0 1.9 2.1  PHOS  --   --  2.6  --    Liver Function Tests: Recent Labs  Lab 11/06/22 1530 11/07/22 0448  AST 24 21  ALT 15 14  ALKPHOS 55 41  BILITOT 1.2 0.9  PROT 7.3 6.2*  ALBUMIN 3.2* 2.7*   CBG: No results for input(s): "GLUCAP" in the last 168 hours.  Discharge time spent: greater than 30 minutes.  Signed: Vassie Loll, MD [facility] 11/09/2022

## 2022-11-09 NOTE — Care Management Important Message (Signed)
Important Message  Patient Details  Name: John Hicks MRN: [id number] Date of Birth: 12/27/1950   Medicare Important Message Given:  N/A - LOS <3 / Initial given by admissions     Corey Harold 11/09/2022, 9:45 AM

## 2022-11-10 LAB — CULTURE, BLOOD (ROUTINE X 2)

## 2022-11-10 NOTE — Consult Note (Addendum)
[facility] Customer service manager [facility]) Accountable Care Organization (ACO) [facility] Liaison Note  11/10/2022  [name] 12-Aug-1950 [id number]  Location: [facility] RN Hospital Liaison screened the patient remotely at [facility].  Insurance: [employer]   John Hicks is a 72 y.o. male who is a Primary Care Patient of Elfredia Nevins, MD. The patient was screened for readmission hospitalization with noted medium risk score for unplanned readmission risk with 1 IP in 6 months.  The patient was assessed for potential [facility]) Care Management service needs for post hospital transition for care coordination. Review of patient's electronic medical record reveals patient was admitted with Sepsis. Pt follow closely by oncology.   Plan: [facility] Liaison will continue to follow progress and disposition to asess for post hospital community care coordination/management needs.  Referral request for community care coordination: anticipate [facility] Transitions of Care Team follow up.   [facility] Care Management/Population Health does not replace or interfere with any arrangements made by the Inpatient Transition of Care team.   For questions contact:   Elliot Cousin, RN, BSN [facility] Liaison [place]   [facility]  Population Health Office Hours MTWF  8:00 am-6:00 pm Off on Thursday [phone] mobile [phone] [Office toll free line][facility] Office Hours are M-F 8:30 - 5 pm 24 hour nurse advise line [phone] Concierge  [email or web address]

## 2022-11-11 LAB — CULTURE, BLOOD (ROUTINE X 2)
Culture: NO GROWTH
Culture: NO GROWTH
Special Requests: ADEQUATE

## 2022-11-20 ENCOUNTER — Inpatient Hospital Stay (HOSPITAL_COMMUNITY)
Admission: EM | Admit: 2022-11-20 | Discharge: 2022-11-29 | DRG: 641 | Disposition: A | Discharge status: Skilled Nursing Facility | Payer: PPO | Attending: Internal Medicine | Admitting: Internal Medicine

## 2022-11-20 ENCOUNTER — Other Ambulatory Visit: Payer: Self-pay

## 2022-11-20 ENCOUNTER — Encounter (HOSPITAL_COMMUNITY): Payer: Self-pay | Admitting: *Deleted

## 2022-11-20 DIAGNOSIS — I130 Hypertensive heart and chronic kidney disease with heart failure and stage 1 through stage 4 chronic kidney disease, or unspecified chronic kidney disease: Secondary | ICD-10-CM | POA: Diagnosis present

## 2022-11-20 DIAGNOSIS — I959 Hypotension, unspecified: Secondary | ICD-10-CM | POA: Diagnosis present

## 2022-11-20 DIAGNOSIS — Z820 Family history of epilepsy and other diseases of the nervous system: Secondary | ICD-10-CM

## 2022-11-20 DIAGNOSIS — M79671 Pain in right foot: Secondary | ICD-10-CM | POA: Diagnosis present

## 2022-11-20 DIAGNOSIS — N400 Enlarged prostate without lower urinary tract symptoms: Secondary | ICD-10-CM | POA: Diagnosis present

## 2022-11-20 DIAGNOSIS — E860 Dehydration: Secondary | ICD-10-CM | POA: Diagnosis not present

## 2022-11-20 DIAGNOSIS — I4892 Unspecified atrial flutter: Secondary | ICD-10-CM | POA: Diagnosis present

## 2022-11-20 DIAGNOSIS — R64 Cachexia: Secondary | ICD-10-CM | POA: Diagnosis present

## 2022-11-20 DIAGNOSIS — I482 Chronic atrial fibrillation, unspecified: Secondary | ICD-10-CM | POA: Diagnosis present

## 2022-11-20 DIAGNOSIS — L8990 Pressure ulcer of unspecified site, unspecified stage: Secondary | ICD-10-CM | POA: Diagnosis present

## 2022-11-20 DIAGNOSIS — R9431 Abnormal electrocardiogram [ECG] [EKG]: Secondary | ICD-10-CM | POA: Diagnosis present

## 2022-11-20 DIAGNOSIS — R197 Diarrhea, unspecified: Secondary | ICD-10-CM | POA: Diagnosis not present

## 2022-11-20 DIAGNOSIS — E039 Hypothyroidism, unspecified: Secondary | ICD-10-CM | POA: Diagnosis present

## 2022-11-20 DIAGNOSIS — N179 Acute kidney failure, unspecified: Secondary | ICD-10-CM | POA: Diagnosis present

## 2022-11-20 DIAGNOSIS — Z87440 Personal history of urinary (tract) infections: Secondary | ICD-10-CM

## 2022-11-20 DIAGNOSIS — L89152 Pressure ulcer of sacral region, stage 2: Secondary | ICD-10-CM | POA: Diagnosis present

## 2022-11-20 DIAGNOSIS — I2510 Atherosclerotic heart disease of native coronary artery without angina pectoris: Secondary | ICD-10-CM | POA: Diagnosis present

## 2022-11-20 DIAGNOSIS — Z7982 Long term (current) use of aspirin: Secondary | ICD-10-CM

## 2022-11-20 DIAGNOSIS — E876 Hypokalemia: Secondary | ICD-10-CM | POA: Diagnosis present

## 2022-11-20 DIAGNOSIS — A0811 Acute gastroenteropathy due to Norwalk agent: Secondary | ICD-10-CM | POA: Diagnosis present

## 2022-11-20 DIAGNOSIS — F17210 Nicotine dependence, cigarettes, uncomplicated: Secondary | ICD-10-CM | POA: Diagnosis present

## 2022-11-20 DIAGNOSIS — M109 Gout, unspecified: Secondary | ICD-10-CM | POA: Diagnosis present

## 2022-11-20 DIAGNOSIS — Z823 Family history of stroke: Secondary | ICD-10-CM

## 2022-11-20 DIAGNOSIS — Z66 Do not resuscitate: Secondary | ICD-10-CM | POA: Diagnosis present

## 2022-11-20 DIAGNOSIS — D469 Myelodysplastic syndrome, unspecified: Secondary | ICD-10-CM | POA: Diagnosis present

## 2022-11-20 DIAGNOSIS — R531 Weakness: Secondary | ICD-10-CM | POA: Diagnosis present

## 2022-11-20 DIAGNOSIS — I10 Essential (primary) hypertension: Secondary | ICD-10-CM | POA: Diagnosis present

## 2022-11-20 DIAGNOSIS — N1832 Chronic kidney disease, stage 3b: Secondary | ICD-10-CM | POA: Diagnosis present

## 2022-11-20 DIAGNOSIS — F419 Anxiety disorder, unspecified: Secondary | ICD-10-CM | POA: Diagnosis present

## 2022-11-20 DIAGNOSIS — I714 Abdominal aortic aneurysm, without rupture, unspecified: Secondary | ICD-10-CM | POA: Diagnosis present

## 2022-11-20 DIAGNOSIS — D649 Anemia, unspecified: Secondary | ICD-10-CM | POA: Diagnosis present

## 2022-11-20 DIAGNOSIS — R296 Repeated falls: Secondary | ICD-10-CM | POA: Diagnosis present

## 2022-11-20 DIAGNOSIS — I252 Old myocardial infarction: Secondary | ICD-10-CM

## 2022-11-20 DIAGNOSIS — E872 Acidosis, unspecified: Secondary | ICD-10-CM | POA: Diagnosis present

## 2022-11-20 DIAGNOSIS — Z79890 Hormone replacement therapy (postmenopausal): Secondary | ICD-10-CM

## 2022-11-20 DIAGNOSIS — D696 Thrombocytopenia, unspecified: Secondary | ICD-10-CM | POA: Diagnosis present

## 2022-11-20 DIAGNOSIS — I5022 Chronic systolic (congestive) heart failure: Secondary | ICD-10-CM | POA: Diagnosis present

## 2022-11-20 DIAGNOSIS — Z6824 Body mass index (BMI) 24.0-24.9, adult: Secondary | ICD-10-CM

## 2022-11-20 DIAGNOSIS — Z79899 Other long term (current) drug therapy: Secondary | ICD-10-CM

## 2022-11-20 DIAGNOSIS — Z86718 Personal history of other venous thrombosis and embolism: Secondary | ICD-10-CM

## 2022-11-20 HISTORY — DX: Leukemia, unspecified not having achieved remission: C95.90

## 2022-11-20 HISTORY — DX: Encounter for other specified aftercare: Z51.89

## 2022-11-20 LAB — COMPREHENSIVE METABOLIC PANEL
ALT: 18 U/L (ref 0–44)
AST: 28 U/L (ref 15–41)
Albumin: 2.6 g/dL — ABNORMAL LOW (ref 3.5–5.0)
Alkaline Phosphatase: 50 U/L (ref 38–126)
Anion gap: 10 (ref 5–15)
BUN: 47 mg/dL — ABNORMAL HIGH (ref 8–23)
CO2: 18 mmol/L — ABNORMAL LOW (ref 22–32)
Calcium: 8.4 mg/dL — ABNORMAL LOW (ref 8.9–10.3)
Chloride: 109 mmol/L (ref 98–111)
Creatinine, Ser: 2.29 mg/dL — ABNORMAL HIGH (ref 0.61–1.24)
GFR, Estimated: 30 mL/min — ABNORMAL LOW (ref >60)
Glucose, Bld: 103 mg/dL — ABNORMAL HIGH (ref 70–99)
Potassium: 3.6 mmol/L (ref 3.5–5.1)
Sodium: 137 mmol/L (ref 135–145)
Total Bilirubin: 1.6 mg/dL — ABNORMAL HIGH (ref 0.3–1.2)
Total Protein: 7.3 g/dL (ref 6.5–8.1)

## 2022-11-20 LAB — CBC WITH DIFFERENTIAL/PLATELET
Abs Immature Granulocytes: 0 10*3/uL (ref 0.00–0.07)
Band Neutrophils: 2 %
Basophils Absolute: 0 10*3/uL (ref 0.0–0.1)
Basophils Relative: 0 %
Eosinophils Absolute: 0 10*3/uL (ref 0.0–0.5)
Eosinophils Relative: 0 %
HCT: 24 % — ABNORMAL LOW (ref 39.0–52.0)
Hemoglobin: 7.8 g/dL — ABNORMAL LOW (ref 13.0–17.0)
Lymphocytes Relative: 14 %
Lymphs Abs: 0.6 10*3/uL — ABNORMAL LOW (ref 0.7–4.0)
MCH: 33.8 pg (ref 26.0–34.0)
MCHC: 32.5 g/dL (ref 30.0–36.0)
MCV: 103.9 fL — ABNORMAL HIGH (ref 80.0–100.0)
Metamyelocytes Relative: 1 %
Monocytes Absolute: 0.4 10*3/uL (ref 0.1–1.0)
Monocytes Relative: 10 %
Neutro Abs: 3.1 10*3/uL (ref 1.7–7.7)
Neutrophils Relative %: 70 %
Other: 3 %
Platelets: 210 10*3/uL (ref 150–400)
RBC: 2.31 MIL/uL — ABNORMAL LOW (ref 4.22–5.81)
RDW: 18.7 % — ABNORMAL HIGH (ref 11.5–15.5)
WBC: 4.3 10*3/uL (ref 4.0–10.5)
nRBC: 0 % (ref 0.0–0.2)

## 2022-11-20 LAB — C DIFFICILE QUICK SCREEN W PCR REFLEX
C Diff antigen: NEGATIVE
C Diff interpretation: NOT DETECTED
C Diff toxin: NEGATIVE

## 2022-11-20 MED ORDER — SODIUM CHLORIDE 0.9 % IV SOLN: INTRAVENOUS | Status: DC

## 2022-11-20 MED ORDER — SODIUM CHLORIDE 0.9 % IV BOLUS
2000.0000 mL | Freq: Once | INTRAVENOUS | Status: AC
  Administered 2022-11-20: 2000 mL via INTRAVENOUS

## 2022-11-20 NOTE — ED Triage Notes (Signed)
Pt BIB [facility] from home for diarrhea x 3 weeks.  Pt with recent chemo last week for leukemia. Decrease in po intake -has been drinking some Ensure. Also states generalized weakness with frequent falls.  Increase in urination.

## 2022-11-20 NOTE — ED Notes (Signed)
Report called to 3rd floor.

## 2022-11-20 NOTE — ED Provider Notes (Signed)
[place] EMERGENCY DEPARTMENT AT [facility] Provider Note   CSN: [id number] Arrival date & time: 11/20/22  1744     History  Chief Complaint  Patient presents with   Diarrhea    John Hicks is a 72 y.o. male.  Patient had mild lysis syndrome.  He states he has been having over 10 bouts of diarrhea a day.  He has been very weak.  No blood in his stool.  The history is provided by the patient and medical records. No language interpreter was used.  Diarrhea Quality:  Watery Severity:  Moderate Onset quality:  Sudden Timing:  Constant Progression:  Unchanged Relieved by:  Nothing Worsened by:  Nothing Ineffective treatments:  None tried Associated symptoms: no abdominal pain and no headaches        Home Medications Prior to Admission medications   Medication Sig Start Date End Date Taking? Authorizing Provider  ALPRAZolam Prudy Feeler) 1 MG tablet Take 1 mg by mouth 3 (three) times daily as needed for anxiety. 07/06/20   [provider]  amiodarone (PACERONE) 200 MG tablet Take 200 mg by mouth daily at 2 PM. 08/23/16   [provider]  aspirin 81 MG chewable tablet Chew 81 mg by mouth daily at 2 PM.    [provider]  azaCITIDine 5 mg/2 mLs in lactated ringers infusion Inject into the vein every 28 (twenty-eight) days. Days 1-7 every 28 days 10/02/22   [provider]  carvedilol (COREG) 6.25 MG tablet Take 1 tablet (6.25 mg total) by mouth 2 (two) times daily. 09/13/22   Rollene Rotunda, MD  Cholecalciferol (VITAMIN D3) 50 MCG (2000 UT) capsule Take 2,000 Units by mouth daily at 2 PM. 1400    [provider]  Coenzyme Q10 (COQ10) 200 MG CAPS Take 200 mg by mouth daily at 2 PM.    [provider]  Flaxseed, Linseed, (FLAXSEED OIL) 1000 MG CAPS Take 1,000 mg by mouth daily at 2 PM.    [provider]  folic acid (FOLVITE) 1 MG tablet Take 1 mg by mouth daily at 2 PM. One daily    [provider]   furosemide (LASIX) 20 MG tablet Take 2 tablets (40 mg total) by mouth daily as needed for fluid or edema. 1400 & bedtime 11/09/22   Vassie Loll, MD  levothyroxine (SYNTHROID) 25 MCG tablet Take 25 mcg by mouth daily at 2 PM. 03/29/21   [provider]  lidocaine-prilocaine (EMLA) cream Apply 1 Application topically as needed (Apply to port site 1 hour prior to access). 10/10/22   Doreatha Massed, MD  loperamide (IMODIUM) 2 MG capsule Take 1 capsule (2 mg total) by mouth every 6 (six) hours as needed for diarrhea or loose stools. 11/09/22   Vassie Loll, MD  ondansetron (ZOFRAN) 4 MG tablet Take 1 tablet (4 mg total) by mouth every 8 (eight) hours as needed. 10/12/22 10/12/23  Lucretia Roers, MD  potassium chloride SA (KLOR-CON M) 20 MEQ tablet Take 2 tablets (40 mEq total) by mouth daily. 11/09/22   Vassie Loll, MD  prochlorperazine (COMPAZINE) 10 MG tablet Take 1 tablet (10 mg total) by mouth every 6 (six) hours as needed for nausea or vomiting. 10/10/22   Doreatha Massed, MD  tamsulosin (FLOMAX) 0.4 MG CAPS capsule Take 0.4 mg by mouth daily at 2 PM. 03/28/21   [provider]      Allergies    Codeine    Review of Systems  Review of Systems  Constitutional:  Negative for appetite change and fatigue.  HENT:  Negative for congestion, ear discharge and sinus pressure.   Eyes:  Negative for discharge.  Respiratory:  Negative for cough.   Cardiovascular:  Negative for chest pain.  Gastrointestinal:  Positive for diarrhea. Negative for abdominal pain.  Genitourinary:  Negative for frequency and hematuria.  Musculoskeletal:  Negative for back pain.  Skin:  Negative for rash.  Neurological:  Negative for seizures and headaches.  Psychiatric/Behavioral:  Negative for hallucinations.     Physical Exam Updated Vital Signs BP 115/69   Pulse 77   Temp (!) 97.5 F (36.4 C) (Oral)   Resp 13   Ht 6' (1.829 m)   Wt 81.6 kg   SpO2 99%   BMI 24.41 kg/m   Physical Exam Vitals and nursing note reviewed.  Constitutional:      Appearance: He is well-developed.  HENT:     Head: Normocephalic.     Nose: Nose normal.  Eyes:     General: No scleral icterus.    Conjunctiva/sclera: Conjunctivae normal.  Neck:     Thyroid: No thyromegaly.  Cardiovascular:     Rate and Rhythm: Normal rate and regular rhythm.     Heart sounds: No murmur heard.    No friction rub. No gallop.  Pulmonary:     Breath sounds: No stridor. No wheezing or rales.  Chest:     Chest wall: No tenderness.  Abdominal:     General: There is no distension.     Tenderness: There is no abdominal tenderness. There is no rebound.  Musculoskeletal:        General: Normal range of motion.     Cervical back: Neck supple.  Lymphadenopathy:     Cervical: No cervical adenopathy.  Skin:    Findings: No erythema or rash.  Neurological:     Mental Status: He is alert and oriented to person, place, and time.     Motor: No abnormal muscle tone.     Coordination: Coordination normal.  Psychiatric:        Behavior: Behavior normal.     ED Results / Procedures / Treatments   Labs (all labs ordered are listed, but only abnormal results are displayed) Labs Reviewed  CBC WITH DIFFERENTIAL/PLATELET - Abnormal; Notable for the following components:      Result Value   RBC 2.31 (*)    Hemoglobin 7.8 (*)    HCT 24.0 (*)    MCV 103.9 (*)    RDW 18.7 (*)    Lymphs Abs 0.6 (*)    All other components within normal limits  COMPREHENSIVE METABOLIC PANEL - Abnormal; Notable for the following components:   CO2 18 (*)    Glucose, Bld 103 (*)    BUN 47 (*)    Creatinine, Ser 2.29 (*)    Calcium 8.4 (*)    Albumin 2.6 (*)    Total Bilirubin 1.6 (*)    GFR, Estimated 30 (*)    All other components within normal limits  GASTROINTESTINAL PANEL BY PCR, STOOL (REPLACES STOOL CULTURE)  C DIFFICILE QUICK SCREEN W PCR REFLEX    PATHOLOGIST SMEAR REVIEW    EKG None  Radiology No  results found.  Procedures Procedures    Medications Ordered in ED Medications  0.9 %  sodium chloride infusion (has no administration in time range)  sodium chloride 0.9 % bolus 2,000 mL (0 mLs Intravenous Stopped 11/20/22 2140)  ED Course/ Medical Decision Making/ A&P     Patient with myelodysplastic syndrome and persistent diarrhea after chemotherapy.  I spoke with Dr. Ellin Saba, oncology and he agrees with admission to the hospital with hydration                           Medical Decision Making Amount and/or Complexity of Data Reviewed Labs: ordered.  Risk Decision regarding hospitalization.   Persistent diarrhea from chemotherapy.  Patient with AKI and will be admitted for hydration        Final Clinical Impression(s) / ED Diagnoses Final diagnoses:  Dehydration  Diarrhea, unspecified type    Rx / DC Orders ED Discharge Orders     None         Bethann Berkshire, MD 11/22/22 1111

## 2022-11-21 ENCOUNTER — Observation Stay (HOSPITAL_COMMUNITY): Payer: PPO

## 2022-11-21 ENCOUNTER — Other Ambulatory Visit: Payer: Self-pay

## 2022-11-21 DIAGNOSIS — R509 Fever, unspecified: Secondary | ICD-10-CM | POA: Diagnosis not present

## 2022-11-21 DIAGNOSIS — R9431 Abnormal electrocardiogram [ECG] [EKG]: Secondary | ICD-10-CM | POA: Diagnosis not present

## 2022-11-21 DIAGNOSIS — Z66 Do not resuscitate: Secondary | ICD-10-CM | POA: Diagnosis not present

## 2022-11-21 DIAGNOSIS — D696 Thrombocytopenia, unspecified: Secondary | ICD-10-CM | POA: Diagnosis not present

## 2022-11-21 DIAGNOSIS — I5042 Chronic combined systolic (congestive) and diastolic (congestive) heart failure: Secondary | ICD-10-CM | POA: Diagnosis not present

## 2022-11-21 DIAGNOSIS — Z515 Encounter for palliative care: Secondary | ICD-10-CM | POA: Diagnosis not present

## 2022-11-21 DIAGNOSIS — I2510 Atherosclerotic heart disease of native coronary artery without angina pectoris: Secondary | ICD-10-CM | POA: Diagnosis not present

## 2022-11-21 DIAGNOSIS — L8990 Pressure ulcer of unspecified site, unspecified stage: Secondary | ICD-10-CM | POA: Diagnosis present

## 2022-11-21 DIAGNOSIS — M19071 Primary osteoarthritis, right ankle and foot: Secondary | ICD-10-CM | POA: Diagnosis not present

## 2022-11-21 DIAGNOSIS — N400 Enlarged prostate without lower urinary tract symptoms: Secondary | ICD-10-CM | POA: Diagnosis not present

## 2022-11-21 DIAGNOSIS — A0811 Acute gastroenteropathy due to Norwalk agent: Secondary | ICD-10-CM | POA: Diagnosis not present

## 2022-11-21 DIAGNOSIS — I4892 Unspecified atrial flutter: Secondary | ICD-10-CM | POA: Diagnosis not present

## 2022-11-21 DIAGNOSIS — R1312 Dysphagia, oropharyngeal phase: Secondary | ICD-10-CM | POA: Diagnosis not present

## 2022-11-21 DIAGNOSIS — N1832 Chronic kidney disease, stage 3b: Secondary | ICD-10-CM | POA: Diagnosis not present

## 2022-11-21 DIAGNOSIS — I959 Hypotension, unspecified: Secondary | ICD-10-CM | POA: Diagnosis not present

## 2022-11-21 DIAGNOSIS — L89152 Pressure ulcer of sacral region, stage 2: Secondary | ICD-10-CM | POA: Diagnosis not present

## 2022-11-21 DIAGNOSIS — E569 Vitamin deficiency, unspecified: Secondary | ICD-10-CM | POA: Diagnosis not present

## 2022-11-21 DIAGNOSIS — I252 Old myocardial infarction: Secondary | ICD-10-CM | POA: Diagnosis not present

## 2022-11-21 DIAGNOSIS — F17210 Nicotine dependence, cigarettes, uncomplicated: Secondary | ICD-10-CM | POA: Diagnosis not present

## 2022-11-21 DIAGNOSIS — I10 Essential (primary) hypertension: Secondary | ICD-10-CM | POA: Diagnosis not present

## 2022-11-21 DIAGNOSIS — M7731 Calcaneal spur, right foot: Secondary | ICD-10-CM | POA: Diagnosis not present

## 2022-11-21 DIAGNOSIS — I482 Chronic atrial fibrillation, unspecified: Secondary | ICD-10-CM | POA: Diagnosis not present

## 2022-11-21 DIAGNOSIS — Z86718 Personal history of other venous thrombosis and embolism: Secondary | ICD-10-CM | POA: Diagnosis not present

## 2022-11-21 DIAGNOSIS — D469 Myelodysplastic syndrome, unspecified: Secondary | ICD-10-CM

## 2022-11-21 DIAGNOSIS — R197 Diarrhea, unspecified: Secondary | ICD-10-CM

## 2022-11-21 DIAGNOSIS — D649 Anemia, unspecified: Secondary | ICD-10-CM | POA: Diagnosis not present

## 2022-11-21 DIAGNOSIS — M6281 Muscle weakness (generalized): Secondary | ICD-10-CM | POA: Diagnosis not present

## 2022-11-21 DIAGNOSIS — I130 Hypertensive heart and chronic kidney disease with heart failure and stage 1 through stage 4 chronic kidney disease, or unspecified chronic kidney disease: Secondary | ICD-10-CM | POA: Diagnosis not present

## 2022-11-21 DIAGNOSIS — N4289 Other specified disorders of prostate: Secondary | ICD-10-CM | POA: Diagnosis not present

## 2022-11-21 DIAGNOSIS — M79671 Pain in right foot: Secondary | ICD-10-CM | POA: Diagnosis present

## 2022-11-21 DIAGNOSIS — E872 Acidosis, unspecified: Secondary | ICD-10-CM | POA: Diagnosis not present

## 2022-11-21 DIAGNOSIS — R64 Cachexia: Secondary | ICD-10-CM | POA: Diagnosis not present

## 2022-11-21 DIAGNOSIS — I517 Cardiomegaly: Secondary | ICD-10-CM | POA: Diagnosis not present

## 2022-11-21 DIAGNOSIS — I714 Abdominal aortic aneurysm, without rupture, unspecified: Secondary | ICD-10-CM | POA: Diagnosis not present

## 2022-11-21 DIAGNOSIS — R278 Other lack of coordination: Secondary | ICD-10-CM | POA: Diagnosis not present

## 2022-11-21 DIAGNOSIS — Z7189 Other specified counseling: Secondary | ICD-10-CM | POA: Diagnosis not present

## 2022-11-21 DIAGNOSIS — R918 Other nonspecific abnormal finding of lung field: Secondary | ICD-10-CM | POA: Diagnosis not present

## 2022-11-21 DIAGNOSIS — M7989 Other specified soft tissue disorders: Secondary | ICD-10-CM | POA: Diagnosis not present

## 2022-11-21 DIAGNOSIS — I480 Paroxysmal atrial fibrillation: Secondary | ICD-10-CM | POA: Diagnosis not present

## 2022-11-21 DIAGNOSIS — E876 Hypokalemia: Secondary | ICD-10-CM | POA: Diagnosis not present

## 2022-11-21 DIAGNOSIS — M109 Gout, unspecified: Secondary | ICD-10-CM | POA: Diagnosis not present

## 2022-11-21 DIAGNOSIS — E860 Dehydration: Secondary | ICD-10-CM | POA: Diagnosis not present

## 2022-11-21 DIAGNOSIS — I5022 Chronic systolic (congestive) heart failure: Secondary | ICD-10-CM | POA: Diagnosis not present

## 2022-11-21 DIAGNOSIS — I700 Atherosclerosis of aorta: Secondary | ICD-10-CM | POA: Diagnosis not present

## 2022-11-21 DIAGNOSIS — N179 Acute kidney failure, unspecified: Secondary | ICD-10-CM | POA: Diagnosis not present

## 2022-11-21 DIAGNOSIS — F419 Anxiety disorder, unspecified: Secondary | ICD-10-CM | POA: Diagnosis not present

## 2022-11-21 DIAGNOSIS — E039 Hypothyroidism, unspecified: Secondary | ICD-10-CM | POA: Diagnosis not present

## 2022-11-21 LAB — CBC WITH DIFFERENTIAL/PLATELET
Abs Immature Granulocytes: 0 10*3/uL (ref 0.00–0.07)
Basophils Absolute: 0 10*3/uL (ref 0.0–0.1)
Basophils Relative: 1 %
Eosinophils Absolute: 0 10*3/uL (ref 0.0–0.5)
Eosinophils Relative: 0 %
HCT: 21.1 % — ABNORMAL LOW (ref 39.0–52.0)
Hemoglobin: 6.6 g/dL — CL (ref 13.0–17.0)
Immature Granulocytes: 0 %
Lymphocytes Relative: 15 %
Lymphs Abs: 0.7 10*3/uL (ref 0.7–4.0)
MCH: 33.3 pg (ref 26.0–34.0)
MCHC: 31.3 g/dL (ref 30.0–36.0)
MCV: 106.6 fL — ABNORMAL HIGH (ref 80.0–100.0)
Monocytes Absolute: 1 10*3/uL (ref 0.1–1.0)
Monocytes Relative: 21 %
Neutro Abs: 2.8 10*3/uL (ref 1.7–7.7)
Neutrophils Relative %: 63 %
Platelets: 165 10*3/uL (ref 150–400)
RBC: 1.98 MIL/uL — ABNORMAL LOW (ref 4.22–5.81)
RDW: 19.3 % — ABNORMAL HIGH (ref 11.5–15.5)
WBC: 4.5 10*3/uL (ref 4.0–10.5)
nRBC: 0 % (ref 0.0–0.2)

## 2022-11-21 LAB — URINALYSIS, ROUTINE W REFLEX MICROSCOPIC
Bacteria, UA: NONE SEEN
Bilirubin Urine: NEGATIVE
Glucose, UA: NEGATIVE mg/dL
Hgb urine dipstick: NEGATIVE
Ketones, ur: NEGATIVE mg/dL
Leukocytes,Ua: NEGATIVE
Nitrite: NEGATIVE
Protein, ur: 30 mg/dL — AB
Specific Gravity, Urine: 1.016 (ref 1.005–1.030)
pH: 5 (ref 5.0–8.0)

## 2022-11-21 LAB — TYPE AND SCREEN: Unit division: 0

## 2022-11-21 LAB — TSH: TSH: 1.255 u[IU]/mL (ref 0.350–4.500)

## 2022-11-21 LAB — GASTROINTESTINAL PANEL BY PCR, STOOL (REPLACES STOOL CULTURE)

## 2022-11-21 LAB — COMPREHENSIVE METABOLIC PANEL
ALT: 13 U/L (ref 0–44)
AST: 19 U/L (ref 15–41)
Albumin: 2 g/dL — ABNORMAL LOW (ref 3.5–5.0)
Alkaline Phosphatase: 40 U/L (ref 38–126)
Anion gap: 10 (ref 5–15)
BUN: 43 mg/dL — ABNORMAL HIGH (ref 8–23)
CO2: 18 mmol/L — ABNORMAL LOW (ref 22–32)
Calcium: 7.8 mg/dL — ABNORMAL LOW (ref 8.9–10.3)
Chloride: 110 mmol/L (ref 98–111)
Creatinine, Ser: 2 mg/dL — ABNORMAL HIGH (ref 0.61–1.24)
GFR, Estimated: 35 mL/min — ABNORMAL LOW (ref >60)
Glucose, Bld: 99 mg/dL (ref 70–99)
Potassium: 3.3 mmol/L — ABNORMAL LOW (ref 3.5–5.1)
Sodium: 138 mmol/L (ref 135–145)
Total Bilirubin: 1 mg/dL (ref 0.3–1.2)
Total Protein: 5.7 g/dL — ABNORMAL LOW (ref 6.5–8.1)

## 2022-11-21 LAB — MAGNESIUM: Magnesium: 2.2 mg/dL (ref 1.7–2.4)

## 2022-11-21 LAB — PREPARE RBC (CROSSMATCH)

## 2022-11-21 MED ORDER — AMIODARONE HCL 200 MG PO TABS
200.0000 mg | ORAL_TABLET | Freq: Two times a day (BID) | ORAL | Status: DC
  Administered 2022-11-21 – 2022-11-29 (×16): 200 mg via ORAL
  Filled 2022-11-21 (×17): qty 1

## 2022-11-21 MED ORDER — ASPIRIN 81 MG PO CHEW
81.0000 mg | CHEWABLE_TABLET | Freq: Every day | ORAL | Status: DC
  Administered 2022-11-21 – 2022-11-28 (×9): 81 mg via ORAL
  Filled 2022-11-21 (×9): qty 1

## 2022-11-21 MED ORDER — OXYCODONE HCL 5 MG PO TABS
5.0000 mg | ORAL_TABLET | ORAL | Status: DC | PRN
  Administered 2022-11-22 – 2022-11-23 (×3): 5 mg via ORAL
  Filled 2022-11-21 (×3): qty 1

## 2022-11-21 MED ORDER — LEVOTHYROXINE SODIUM 25 MCG PO TABS
25.0000 ug | ORAL_TABLET | Freq: Every day | ORAL | Status: DC
  Administered 2022-11-21 – 2022-11-29 (×9): 25 ug via ORAL
  Filled 2022-11-21 (×10): qty 1

## 2022-11-21 MED ORDER — MORPHINE SULFATE (PF) 2 MG/ML IV SOLN: 2.0000 mg | INTRAVENOUS | Status: DC | PRN

## 2022-11-21 MED ORDER — ONDANSETRON HCL 4 MG/2ML IJ SOLN: 4.0000 mg | Freq: Four times a day (QID) | INTRAMUSCULAR | Status: DC | PRN

## 2022-11-21 MED ORDER — FOLIC ACID 1 MG PO TABS
1.0000 mg | ORAL_TABLET | Freq: Every day | ORAL | Status: DC
  Administered 2022-11-21 – 2022-11-28 (×8): 1 mg via ORAL
  Filled 2022-11-21 (×8): qty 1

## 2022-11-21 MED ORDER — ACETAMINOPHEN 650 MG RE SUPP: 650.0000 mg | Freq: Four times a day (QID) | RECTAL | Status: DC | PRN

## 2022-11-21 MED ORDER — AMIODARONE HCL 200 MG PO TABS: 200.0000 mg | ORAL_TABLET | Freq: Every day | ORAL | Status: DC

## 2022-11-21 MED ORDER — SODIUM CHLORIDE 0.9% IV SOLUTION: Freq: Once | INTRAVENOUS | Status: AC

## 2022-11-21 MED ORDER — POTASSIUM CHLORIDE CRYS ER 20 MEQ PO TBCR
40.0000 meq | EXTENDED_RELEASE_TABLET | ORAL | Status: AC
  Administered 2022-11-21 – 2022-11-22 (×2): 40 meq via ORAL
  Filled 2022-11-21 (×2): qty 2

## 2022-11-21 MED ORDER — ONDANSETRON HCL 4 MG PO TABS: 4.0000 mg | ORAL_TABLET | Freq: Four times a day (QID) | ORAL | Status: DC | PRN

## 2022-11-21 MED ORDER — LOPERAMIDE HCL 2 MG PO CAPS: 2.0000 mg | ORAL_CAPSULE | Freq: Four times a day (QID) | ORAL | Status: DC | PRN

## 2022-11-21 MED ORDER — CARVEDILOL 3.125 MG PO TABS
6.2500 mg | ORAL_TABLET | Freq: Two times a day (BID) | ORAL | Status: DC
  Filled 2022-11-21: qty 2

## 2022-11-21 MED ORDER — ALPRAZOLAM 1 MG PO TABS
1.0000 mg | ORAL_TABLET | Freq: Three times a day (TID) | ORAL | Status: DC | PRN
  Administered 2022-11-22 – 2022-11-29 (×3): 1 mg via ORAL
  Filled 2022-11-21 (×3): qty 1

## 2022-11-21 MED ORDER — ENSURE ENLIVE PO LIQD
237.0000 mL | Freq: Two times a day (BID) | ORAL | Status: DC
  Administered 2022-11-21 – 2022-11-29 (×10): 237 mL via ORAL

## 2022-11-21 MED ORDER — ACETAMINOPHEN 325 MG PO TABS
650.0000 mg | ORAL_TABLET | Freq: Four times a day (QID) | ORAL | Status: DC | PRN
  Administered 2022-11-25 – 2022-11-28 (×3): 650 mg via ORAL
  Filled 2022-11-21 (×3): qty 2

## 2022-11-21 MED ORDER — TAMSULOSIN HCL 0.4 MG PO CAPS
0.4000 mg | ORAL_CAPSULE | Freq: Every day | ORAL | Status: DC
  Administered 2022-11-21 – 2022-11-28 (×8): 0.4 mg via ORAL
  Filled 2022-11-21 (×9): qty 1

## 2022-11-21 MED ORDER — HEPARIN SODIUM (PORCINE) 5000 UNIT/ML IJ SOLN
5000.0000 [IU] | Freq: Three times a day (TID) | INTRAMUSCULAR | Status: DC
  Administered 2022-11-21 – 2022-11-29 (×25): 5000 [IU] via SUBCUTANEOUS
  Filled 2022-11-21 (×24): qty 1

## 2022-11-21 NOTE — TOC Initial Note (Signed)
Transition of Care [facility]) - Initial/Assessment Note    Patient Details  Name: John Hicks MRN: [id number] Date of Birth: June 19, 1950  Transition of Care [facility]) CM/SW Contact:    Leitha Bleak, RN Phone Number: 11/21/2022, 1:29 PM  Clinical Narrative:           Patient admitted with dehydration. Patient states he lives with his landlord and his family. Patient has a cane, but mostly slides around on the floor. PT is recommending SNF. Patient is agreeable. TOC will send out and discuss bed offers with they come in.         Expected Discharge Plan: Skilled Nursing Facility Barriers to Discharge: Continued Medical Work up   Patient Goals and CMS Choice Patient states their goals for this hospitalization and ongoing recovery are:: agreeable to SNF CMS Medicare.gov Compare Post Acute Care list provided to:: Patient Choice offered to / list presented to : Patient [place] ownership interest in [facility].provided to:: Patient    Expected Discharge Plan and Services       Living arrangements for the past 2 months: Single Family Home                       Prior Living Arrangements/Services Living arrangements for the past 2 months: Single Family Home Lives with:: Other (Comment) Patient language and need for interpreter reviewed:: Yes        Need for Family Participation in Patient Care: Yes (Comment) Care giver support system in place?: Yes (comment) Current home services: DME Criminal Activity/Legal Involvement Pertinent to Current Situation/Hospitalization: No - Comment as needed  Activities of Daily Living Home Assistive Devices/Equipment: Cane (specify quad or straight) ADL Screening (condition at time of admission) Patient's cognitive ability adequate to safely complete daily activities?: Yes Is the patient deaf or have difficulty hearing?: No Does the patient have difficulty seeing, even when wearing glasses/contacts?: No Does the patient have difficulty  concentrating, remembering, or making decisions?: No Patient able to express need for assistance with ADLs?: Yes Does the patient have difficulty dressing or bathing?: No Independently performs ADLs?: Yes (appropriate for developmental age) Does the patient have difficulty walking or climbing stairs?: No Weakness of Legs: Both Weakness of Arms/Hands: None  Permission Sought/Granted                  Emotional Assessment     Affect (typically observed): Accepting Orientation: : Oriented to Self, Oriented to Place, Oriented to  Time, Oriented to Situation Alcohol / Substance Use: Not Applicable Psych Involvement: No (comment)  Admission diagnosis:  Dehydration [E86.0] Diarrhea, unspecified type [R19.7] Patient Active Problem List   Diagnosis Date Noted   Pressure injury of skin 11/21/2022   Right foot pain 11/21/2022   Chronic anemia 11/21/2022   Dehydration 11/20/2022   UTI (urinary tract infection) 11/06/2022   Lactic acidosis 11/06/2022   Diarrhea 11/06/2022   Elevated troponin 11/06/2022   Macrocytic anemia 11/06/2022   Leukopenia 11/06/2022   Thrombocytopenia (HCC) 11/06/2022   Hypokalemia 11/06/2022   Prolonged QT interval 11/06/2022   Prostatic mass 11/06/2022   Atrial fibrillation, chronic (HCC) 11/06/2022   Acquired hypothyroidism 11/06/2022   Chronic combined systolic and diastolic CHF (congestive heart failure) (HCC) 11/06/2022   Aortic root dilatation (HCC) 09/10/2022   [name] 09/06/2022   Rectal bleeding 07/03/2022   Thoracic aortic aneurysm without rupture (HCC) 06/23/2021   Diastolic dysfunction 10/28/2020   Constipation 09/27/2020   AAA (abdominal aortic  aneurysm) without rupture (HCC) 01/29/2019   AAA (abdominal aortic aneurysm) (HCC) 01/28/2019   Acute on chronic systolic heart failure (HCC) 09/26/2016   Acute on chronic systolic CHF (congestive heart failure), NYHA class 4 (HCC) 09/26/2016   Encounter for screening  colonoscopy    Palliative care by specialist    NSVT (nonsustained ventricular tachycardia) (HCC)    PAF (paroxysmal atrial fibrillation) (HCC)    Cardiogenic shock (HCC)    AKI (acute kidney injury) (HCC)    Typical atrial flutter (HCC)    Acute exacerbation of congestive heart failure (HCC) 08/12/2016   Tobacco use disorder 08/12/2016   Essential hypertension 08/12/2016   Urinary tract infection 08/12/2016   PCP:  Elfredia Nevins, MD Pharmacy:   [facility] - [place], [place] - [address] [place] [address] Phone: [phone] Fax: [phone]     Social Determinants of Health (SDOH) Social History: SDOH Screenings   Food Insecurity: No Food Insecurity (11/20/2022)  Housing: Low Risk  (11/20/2022)  Transportation Needs: No Transportation Needs (11/20/2022)  Utilities: Not At Risk (11/20/2022)  Depression (PHQ2-9): Low Risk  (06/30/2022)  Tobacco Use: High Risk (11/20/2022)   SDOH Interventions:     Readmission Risk Interventions     No data to display

## 2022-11-21 NOTE — Assessment & Plan Note (Signed)
-   Patient's main concern is his right foot pain - Minimal swelling - He has had multiple falls but does not think that he is specifically hurt the foot during the falls - X-ray right foot pending - Due to the swelling and the fact the patient is in cancer treatment, we will get a ultrasound DVT right lower extremity as well - Pain control with pain scale - Continue to monitor

## 2022-11-21 NOTE — Assessment & Plan Note (Addendum)
-   Secondary to GI losses - 2 L in the ED - Continue IV hydration - Encourage p.o. fluid intake - Trend labs in the a.m.

## 2022-11-21 NOTE — Assessment & Plan Note (Signed)
-   Creatinine increased to 2.29 from 1.89 - Secondary to dehydration secondary to GI losses - Continue IV hydration - Hold nephrotoxic agents when possible - Trend in the a.m.

## 2022-11-21 NOTE — Assessment & Plan Note (Signed)
-   History of prolonged QTc - Admission EKG pending - Monitor on telemetry

## 2022-11-21 NOTE — Progress Notes (Signed)
Date and time results received: 11/21/22 0643  Test: Hemoglobin Critical Value: 6.6  Name of Provider Notified: Carren Rang, MD and Courage, MD  Orders Received? Or Actions Taken?: Awaiting orders.

## 2022-11-21 NOTE — NC FL2 (Signed)
[place] MEDICAID FL2 LEVEL OF CARE FORM     IDENTIFICATION  Patient Name: John Hicks Birthdate: 06/07/1950 Sex: male Admission Date (Current Location): 11/20/2022  [facility] and [place] Number:  [employer] and Address:  [facility],  [address], [place] [address]      Provider Number: [id number]  Attending Physician Name and Address:  Shon Hale, MD  Relative Name and Phone Number:  Culp,Ricky (Relative) [phone]    Current Level of Care: Hospital Recommended Level of Care: Skilled Nursing Facility Prior Approval Number:    Date Approved/Denied:   PASRR Number: [id number]  Discharge Plan: SNF    Current Diagnoses: Patient Active Problem List   Diagnosis Date Noted   Pressure injury of skin 11/21/2022   Right foot pain 11/21/2022   Chronic anemia 11/21/2022   Dehydration 11/20/2022   UTI (urinary tract infection) 11/06/2022   Lactic acidosis 11/06/2022   Diarrhea 11/06/2022   Elevated troponin 11/06/2022   Macrocytic anemia 11/06/2022   Leukopenia 11/06/2022   Thrombocytopenia (HCC) 11/06/2022   Hypokalemia 11/06/2022   Prolonged QT interval 11/06/2022   Prostatic mass 11/06/2022   Atrial fibrillation, chronic (HCC) 11/06/2022   Acquired hypothyroidism 11/06/2022   Chronic combined systolic and diastolic CHF (congestive heart failure) (HCC) 11/06/2022   Aortic root dilatation (HCC) 09/10/2022   [name] 09/06/2022   Rectal bleeding 07/03/2022   Thoracic aortic aneurysm without rupture (HCC) 06/23/2021   Diastolic dysfunction 10/28/2020   Constipation 09/27/2020   AAA (abdominal aortic aneurysm) without rupture (HCC) 01/29/2019   AAA (abdominal aortic aneurysm) (HCC) 01/28/2019   Acute on chronic systolic heart failure (HCC) 09/26/2016   Acute on chronic systolic CHF (congestive heart failure), NYHA class 4 (HCC) 09/26/2016   Encounter for screening colonoscopy    Palliative care by  specialist    NSVT (nonsustained ventricular tachycardia) (HCC)    PAF (paroxysmal atrial fibrillation) (HCC)    Cardiogenic shock (HCC)    AKI (acute kidney injury) (HCC)    Typical atrial flutter (HCC)    Acute exacerbation of congestive heart failure (HCC) 08/12/2016   Tobacco use disorder 08/12/2016   Essential hypertension 08/12/2016   Urinary tract infection 08/12/2016    Orientation RESPIRATION BLADDER Height & Weight     Self, Time, Situation, Place  Normal Continent Weight: 81.6 kg Height:  6' (182.9 cm)  BEHAVIORAL SYMPTOMS/MOOD NEUROLOGICAL BOWEL NUTRITION STATUS      Continent Diet (See DC summary)  AMBULATORY STATUS COMMUNICATION OF NEEDS Skin   Extensive Assist Verbally Bruising                       Personal Care Assistance Level of Assistance  Bathing, Feeding, Dressing Bathing Assistance: Maximum assistance Feeding assistance: Limited assistance Dressing Assistance: Maximum assistance     Functional Limitations Info  Sight, Hearing, Speech Sight Info: Adequate Hearing Info: Adequate Speech Info: Adequate    SPECIAL CARE FACTORS FREQUENCY  PT (By licensed PT)     PT Frequency: 5 times a week              Contractures      Additional Factors Info  Code Status, Allergies Code Status Info: Full Allergies Info: codeine           Current Medications (11/21/2022):  This is the current hospital active medication list Current Facility-Administered Medications  Medication Dose Route Frequency Provider Last Rate Last Admin   0.9 %  sodium chloride  infusion   Intravenous Continuous Zierle-Ghosh, Asia B, DO 125 mL/hr at 11/21/22 0024 New Bag at 11/21/22 0024   acetaminophen (TYLENOL) tablet 650 mg  650 mg Oral Q6H PRN Zierle-Ghosh, Asia B, DO       Or   acetaminophen (TYLENOL) suppository 650 mg  650 mg Rectal Q6H PRN Zierle-Ghosh, Asia B, DO       ALPRAZolam (XANAX) tablet 1 mg  1 mg Oral TID PRN Zierle-Ghosh, Asia B, DO       amiodarone  (PACERONE) tablet 200 mg  200 mg Oral BID Zierle-Ghosh, Asia B, DO       aspirin chewable tablet 81 mg  81 mg Oral Q1400 Zierle-Ghosh, Asia B, DO   81 mg at 11/21/22 0050   carvedilol (COREG) tablet 6.25 mg  6.25 mg Oral BID Zierle-Ghosh, Asia B, DO       feeding supplement (ENSURE ENLIVE / ENSURE PLUS) liquid 237 mL  237 mL Oral BID BM Zierle-Ghosh, Asia B, DO   237 mL at 11/21/22 1214   folic acid (FOLVITE) tablet 1 mg  1 mg Oral Q1400 Zierle-Ghosh, Asia B, DO       heparin injection 5,000 Units  5,000 Units Subcutaneous Q8H Zierle-Ghosh, Asia B, DO   5,000 Units at 11/21/22 0542   levothyroxine (SYNTHROID) tablet 25 mcg  25 mcg Oral Q1400 Zierle-Ghosh, Asia B, DO   25 mcg at 11/21/22 0543   loperamide (IMODIUM) capsule 2 mg  2 mg Oral Q6H PRN Zierle-Ghosh, Asia B, DO       morphine (PF) 2 MG/ML injection 2 mg  2 mg Intravenous Q2H PRN Zierle-Ghosh, Asia B, DO       ondansetron (ZOFRAN) tablet 4 mg  4 mg Oral Q6H PRN Zierle-Ghosh, Asia B, DO       Or   ondansetron (ZOFRAN) injection 4 mg  4 mg Intravenous Q6H PRN Zierle-Ghosh, Asia B, DO       oxyCODONE (Oxy IR/ROXICODONE) immediate release tablet 5 mg  5 mg Oral Q4H PRN Zierle-Ghosh, Asia B, DO       tamsulosin (FLOMAX) capsule 0.4 mg  0.4 mg Oral Q1400 Zierle-Ghosh, Asia B, DO         Discharge Medications: Please see discharge summary for a list of discharge medications.  Relevant Imaging Results:  Relevant Lab Results:   Additional Information SS# [id number]  Leitha Bleak, RN

## 2022-11-21 NOTE — Progress Notes (Signed)
Lab called with results from GI PCR,it was positive for the Norovirus.Dr Marisa Severin notified. Plan of care on going.

## 2022-11-21 NOTE — Plan of Care (Signed)
  Problem: Acute Rehab PT Goals(only PT should resolve) Goal: Pt Will Go Supine/Side To Sit Outcome: Progressing Flowsheets (Taken 11/21/2022 1234) Pt will go Supine/Side to Sit: with supervision Goal: Patient Will Transfer Sit To/From Stand Outcome: Progressing Flowsheets (Taken 11/21/2022 1234) Patient will transfer sit to/from stand:  with min guard assist  with supervision Goal: Pt Will Transfer Bed To Chair/Chair To Bed Outcome: Progressing Flowsheets (Taken 11/21/2022 1234) Pt will Transfer Bed to Chair/Chair to Bed:  with supervision  min guard assist Goal: Pt Will Ambulate Outcome: Progressing Flowsheets (Taken 11/21/2022 1234) Pt will Ambulate:  25 feet  with min guard assist  with minimal assist  with rolling walker   12:34 PM, 11/21/22 Ocie Bob, MPT Physical Therapist with [facility] [phone] office [phone] mobile phone

## 2022-11-21 NOTE — Progress Notes (Signed)
Patient placed on Enteric Isolation per MD order, and Infection control policy. Patient and Family educated on Enteric Isolation and educational notes provided, verbalized understanding.

## 2022-11-21 NOTE — Assessment & Plan Note (Signed)
-  Continue Coreg 

## 2022-11-21 NOTE — Assessment & Plan Note (Signed)
-   Dr. Kirtland Bouchard consulted from the ED recommended admission for hydration - Patient gets 7 days of chemo and then 28 days off - His last chemo was 2 weeks ago - CBC is currently stable - Continue to monitor

## 2022-11-21 NOTE — Assessment & Plan Note (Addendum)
-   Continue amiodarone and Coreg - Continue aspirin

## 2022-11-21 NOTE — H&P (Signed)
History and Physical    Patient: John Hicks:[id number] DOB: [birth year] DOA: 11/20/2022 DOS: the patient was seen and examined on 11/21/2022 PCP: Elfredia Nevins, MD  Patient coming from: Home  Chief Complaint:  Chief Complaint  Patient presents with   Diarrhea   HPI: John Hicks is a 72 y.o. male with medical history significant of AAA, atrial fibrillation, anxiety, BPH, CHF, hypertension, hypothyroidism, MDS, CAD, and more presents the ED with a chief complaint of generalized weakness.  Patient reports that he has had multiple falls, has not been able to get up off the ground, and has had generalized weakness.  Patient is currently on chemo.  He gets chemo for 7 days and then gets 28 days off.  His last chemo treatment was 2 weeks ago.  Patient has developed diarrhea since having chemo per chart review.  Patient reports that he has developed of diarrhea since being admitted last.  He reports he is having diarrhea about 10 times per day.  It is nonbloody.  It is causing him to have perianal soreness and tenderness.  He reports stomach cramping pain.  The pain is intermittent.  Usually occurs directly before bowel movement.  He does not always have control over the bowel movements and has to wear depends.  Has had no p.o. intake for 4 days but continues to have diarrhea.  He reports he only drinks a little bit of water with his meds in the morning.  He denies any fevers.  He has taken Imodium but he does not think it has helped with his diarrhea.  Patient reports he has had generalized weakness.  He used to be able to ambulate, independently go to the grocery store, and function as "a normal person."  Since having chemo he has been "a total wreck."  He reports he cannot walk, he bruised from falling.  He has right foot pain.  Patient is most concerned about this right foot pain.  He reports that it is painful all the time.  It is worse when he bears weight.  Is swollen compared to the left foot.   He describes the pain as sharp.  He reports that this has been going on for several days.  He does not associate it with any of his falls.  Patient has no other complaints at this time.  Patient reports he quit smoking 2 weeks ago.  He does not drink alcohol.  He is vaccinated for COVID.  Patient does have ACP documents which specifically say he would not want his life to be prolonged if he had a condition that cannot be cured, if he becomes unconscious and his doctors determine that he will never regain consciousness, or if he suffers from advanced dementia.  He does not have a DNR on file. Review of Systems: As mentioned in the history of present illness. All other systems reviewed and are negative. Past Medical History:  Diagnosis Date   AAA (abdominal aortic aneurysm) (HCC)    AF (atrial fibrillation) (HCC)    Anxiety    Blood transfusion without reported diagnosis    BPH (benign prostatic hyperplasia)    CHF (congestive heart failure) (HCC)    Chronic kidney disease    stage 3   Depression    Dyspnea    Dysrhythmia    Hypertension    Hypothyroid    Leukemia (HCC)    Myocardial infarction [facility])    Past Surgical History:  Procedure Laterality Date   ABDOMINAL  AORTIC ENDOVASCULAR STENT GRAFT N/A 02/03/2019   Procedure: ABDOMINAL AORTIC ENDOVASCULAR STENT GRAFT;  Surgeon: Maeola Harman, MD;  Location: [facility] OR;  Service: Vascular;  Laterality: N/A;   APPENDECTOMY     KNEE SURGERY     PORTACATH PLACEMENT Right 10/12/2022   Procedure: INSERTION PORT-A-CATH;  Surgeon: Lucretia Roers, MD;  Location: [facility] ORS;  Service: General;  Laterality: Right;   ULTRASOUND GUIDANCE FOR VASCULAR ACCESS  02/03/2019   Procedure: Ultrasound Guidance For Vascular Access;  Surgeon: Maeola Harman, MD;  Location: [facility] OR;  Service: Vascular;;   Social History:  reports that he has been smoking cigarettes. He has been smoking an average of 1 pack per day. He has been exposed to tobacco  smoke. He has never used smokeless tobacco. He reports that he does not currently use drugs after having used the following drugs: Marijuana. He reports that he does not drink alcohol.  Allergies  Allergen Reactions   Codeine Other (See Comments)    agitation    Family History  Problem Relation Age of Onset   Stroke Father 66       went in the bathroom and collapsed   Multiple sclerosis Sister    Psychiatric Illness Mother 63    Prior to Admission medications   Medication Sig Start Date End Date Taking? Authorizing Provider  ALPRAZolam Prudy Feeler) 1 MG tablet Take 1 mg by mouth 3 (three) times daily as needed for anxiety. 07/06/20   [provider]  amiodarone (PACERONE) 200 MG tablet Take 200 mg by mouth daily at 2 PM. 08/23/16   [provider]  aspirin 81 MG chewable tablet Chew 81 mg by mouth daily at 2 PM.    [provider]  azaCITIDine 5 mg/2 mLs in lactated ringers infusion Inject into the vein every 28 (twenty-eight) days. Days 1-7 every 28 days 10/02/22   [provider]  carvedilol (COREG) 6.25 MG tablet Take 1 tablet (6.25 mg total) by mouth 2 (two) times daily. 09/13/22   Rollene Rotunda, MD  Cholecalciferol (VITAMIN D3) 50 MCG (2000 UT) capsule Take 2,000 Units by mouth daily at 2 PM. 1400    [provider]  Coenzyme Q10 (COQ10) 200 MG CAPS Take 200 mg by mouth daily at 2 PM.    [provider]  Flaxseed, Linseed, (FLAXSEED OIL) 1000 MG CAPS Take 1,000 mg by mouth daily at 2 PM.    [provider]  folic acid (FOLVITE) 1 MG tablet Take 1 mg by mouth daily at 2 PM. One daily    [provider]  furosemide (LASIX) 20 MG tablet Take 2 tablets (40 mg total) by mouth daily as needed for fluid or edema. 1400 & bedtime 11/09/22   Vassie Loll, MD  levothyroxine (SYNTHROID) 25 MCG tablet Take 25 mcg by mouth daily at 2 PM. 03/29/21   [provider]  lidocaine-prilocaine (EMLA) cream Apply 1 Application  topically as needed (Apply to port site 1 hour prior to access). 10/10/22   Doreatha Massed, MD  loperamide (IMODIUM) 2 MG capsule Take 1 capsule (2 mg total) by mouth every 6 (six) hours as needed for diarrhea or loose stools. 11/09/22   Vassie Loll, MD  ondansetron (ZOFRAN) 4 MG tablet Take 1 tablet (4 mg total) by mouth every 8 (eight) hours as needed. 10/12/22 10/12/23  Lucretia Roers, MD  potassium chloride SA (KLOR-CON M) 20 MEQ tablet Take 2 tablets (40 mEq total) by mouth daily. 11/09/22  Vassie Loll, MD  prochlorperazine (COMPAZINE) 10 MG tablet Take 1 tablet (10 mg total) by mouth every 6 (six) hours as needed for nausea or vomiting. 10/10/22   Doreatha Massed, MD  tamsulosin [facility]) 0.4 MG CAPS capsule Take 0.4 mg by mouth daily at 2 PM. 03/28/21   [provider]    Physical Exam: Vitals:   11/20/22 2115 11/20/22 2130 11/20/22 2300 11/20/22 2345  BP: 117/67 115/69 120/68 (!) 88/63  Pulse: 78 77 81 88  Resp: 13  14 20   Temp:   98 F (36.7 C) 98.9 F (37.2 C)  TempSrc:      SpO2: 100% 99% 96%   Weight:      Height:       1.  General: Patient lying supine in bed,  no acute distress   2. Psychiatric: Alert and oriented x 3, mood and behavior normal for situation, pleasant and cooperative with exam   3. Neurologic: Speech and language are normal, face is symmetric, moves all 4 extremities voluntarily, at baseline without acute deficits on limited exam   4. HEENMT:  Head is atraumatic, normocephalic, pupils reactive to light, neck is supple, trachea is midline, mucous membranes are moist   5. Respiratory : Lungs are clear to auscultation bilaterally without wheezing, rhonchi, rales, no cyanosis, no increase in work of breathing or accessory muscle use   6. Cardiovascular : Heart rate normal, rhythm is regular, no murmurs, rubs or gallops, peripheral pulses palpated   7. Gastrointestinal:  Abdomen is soft, nondistended, apprehension to palpation  but no actual tenderness or guarding, bowel sounds active, no masses or organomegaly palpated   8. Skin:  Skin is warm, dry and intact without rashes, acute lesions, or ulcers on limited exam   9.Musculoskeletal:  No acute deformities or trauma, no asymmetry in tone, peripheral pulses palpated, exquisite tenderness to palpation anywhere on the right foot, minimal erythema at the ankle, minimal swelling  Data Reviewed: In the ED Temp 97.5-98.9, heart rate 77-111, respiratory rate 13-25, blood pressure 88/63-125/83, satting 94-100% Borderline leukopenia at 4.3, hemoglobin stable at 7.8 Elevated BUN of 47, elevated creatinine at 2.29 Patient was given a 2 L bolus Dr. Kirtland Bouchard was consulted and advised patient to be admitted by medicine for fluid resuscitation Assessment and Plan: * Dehydration - Secondary to GI losses - 2 L in the ED - Continue IV hydration - Encourage p.o. fluid intake - Trend labs in the a.m.  Chronic anemia - Hemoglobin stable at 7.8 - Continue folic acid - Trend in the a.m.  Right foot pain - Patient's main concern is his right foot pain - Minimal swelling - He has had multiple falls but does not think that he is specifically hurt the foot during the falls - X-ray right foot pending - Due to the swelling and the fact the patient is in cancer treatment, we will get a ultrasound DVT right lower extremity as well - Pain control with pain scale - Continue to monitor  Atrial fibrillation, chronic (HCC) - Continue amiodarone and Coreg - Continue aspirin  Prolonged QT interval - History of prolonged QTc - Admission EKG pending - Monitor on telemetry  Diarrhea - Last discharged on June 20 and patient reports he has had diarrhea since then - He did receive antibiotics during that hospitalization for UTI - Today C. difficile is negative - GI panel pending - Imodium for symptom control - Most likely this diarrhea is due to the chemo - 2 L bolus  given in the ED -  Continue IV fluids - Full liquid diet - Continue to monitor  [name] - Dr. Kirtland Bouchard consulted from the ED recommended admission for hydration - Patient gets 7 days of chemo and then 28 days off - His last chemo was 2 weeks ago - CBC is currently stable - Continue to monitor  AKI (acute kidney injury) (HCC) - Creatinine increased to 2.29 from 1.89 - Secondary to dehydration secondary to GI losses - Continue IV hydration - Hold nephrotoxic agents when possible - Trend in the a.m.  Essential hypertension - Continue Coreg      Advance Care Planning:   Code Status: Full Code  Consults: Dr. Kirtland Bouchard was consulted by the EDP  Family Communication: No family at bedside  Severity of Illness: The appropriate patient status for this patient is OBSERVATION. Observation status is judged to be reasonable and necessary in order to provide the required intensity of service to ensure the patient's safety. The patient's presenting symptoms, physical exam findings, and initial radiographic and laboratory data in the context of their medical condition is felt to place them at decreased risk for further clinical deterioration. Furthermore, it is anticipated that the patient will be medically stable for discharge from the hospital within 2 midnights of admission.   Author: Lilyan Gilford, DO 11/21/2022 3:56 AM  For on call review [email or web address].

## 2022-11-21 NOTE — Assessment & Plan Note (Addendum)
-   Last discharged on June 20 and patient reports he has had diarrhea since then - He did receive antibiotics during that hospitalization for UTI - Today C. difficile is negative - GI panel pending - Imodium for symptom control - Most likely this diarrhea is due to the chemo - 2 L bolus given in the ED - Continue IV fluids - Full liquid diet - Continue to monitor

## 2022-11-21 NOTE — Consult Note (Signed)
WOC Nurse Consult Note: Reason for Consult:stage 2 PI to sacrum Wound type:pressure Pressure Injury POA: Yes Measurement:2cm x 2cm x 0.1cm per nursing flow sheet Wound WUJ:WJXB, moist Drainage (amount, consistency, odor) scant serous Periwound:intact Dressing procedure/placement/frequency: Guidance is provided for nursing in the care of this partial thickness wound using a NS cleanse, pat dry, cover with antimicrobial nonadherent (xeroform) gauze then secured with a silicone foam. This is to be changed daily. The heels are to be floated. Turning and repositioning is in place and will minimize time in the supine position. A pressure redistribution chair pad is provided for use when OOB in the chair and is to be sent with patient at time of discharge.  WOC nursing team will not follow, but will remain available to this patient, the nursing and medical teams.  Please re-consult if needed.  Thank you for inviting [place] to participate in this patient's Plan of Care.  Ladona Mow, MSN, RN, CNS, GNP, Leda Min, [employer], [employer] phone:  [phone]

## 2022-11-21 NOTE — Assessment & Plan Note (Signed)
-   Hemoglobin stable at 7.8 - Continue folic acid - Trend in the a.m.

## 2022-11-21 NOTE — Progress Notes (Signed)
Patient seen and evaluated, chart reviewed, please see EMR for updated orders. Please see full H&P dictated by admitting physician Dr Camillo Flaming for same date of service.   Brief Summary:- 72 y.o. male with medical history significant of AAA, atrial fibrillation, anxiety, BPH, CHF, hypertension, hypothyroidism, MDS, CAD, admitted on 11/21/2022 with diarrhea dehydration and hypotension with AKI  A/p 1) norovirus enterocolitis with diarrhea--- persistent diarrhea -Stool for C. difficile negative -GI panel positive for norovirus supportive and symptomatic treatment  2) acute on chronic anemia--patient with history of  MDS -Suspect some ongoing GI losses with his norovirus enterocolitis -Hemoglobin down to 6.6 after IV fluids suspect some component of hemodilution recent baseline around 7 -Transfuse PRBC -EDP discussed case with Dr. Ellin Saba -Patient last chemotherapy was a couple weeks ago  3) hypotension--- due to dehydration , volume depletion in the setting of GI losses  -and due to #1 and #2 above -Continue IV fluids -Hold PTA Lasix and Coreg -Encourage adequate oral intake  4)AKI----acute kidney injury on CKD stage -3B--due to GI losses and hypotension -Creatinine on admission 2.29 prior baseline 1.58-1.75 back in May and June 2024 -Hydrate - renally adjust medications, avoid nephrotoxic agents / dehydration  / hypotension  5)Non-anion gap metabolic acidosis--- due to 1 above -Continue to hydrate  6)Hypokalemia--- due to GI losses replace and recheck  7) hypothyroidism--- TSH is normal at 1.25, continue levothyroxine  8)PAFib/Flutter--hold Coreg due to soft BP, continue amiodarone  9)Rt  foot pain--- x-rays of the right foot without acute findings,  Rt lower extremity venous Dopplers without DVT  Patient seen and evaluated, chart reviewed, please see EMR for updated orders. Please see full H&P dictated by admitting physician Dr Camillo Flaming for same date of service.   Total care time  56 minutes  Shon Hale, MD

## 2022-11-21 NOTE — Evaluation (Signed)
Physical Therapy Evaluation Patient Details Name: John Hicks MRN: [id number] DOB: [birth year] Today's Date: 11/21/2022  History of Present Illness  John Hicks is a 72 y.o. male with medical history significant of AAA, atrial fibrillation, anxiety, BPH, CHF, hypertension, hypothyroidism, MDS, CAD, and more presents the ED with a chief complaint of generalized weakness.  Patient reports that he has had multiple falls, has not been able to get up off the ground, and has had generalized weakness.  Patient is currently on chemo.  He gets chemo for 7 days and then gets 28 days off.  His last chemo treatment was 2 weeks ago.  Patient has developed diarrhea since having chemo per chart review.  Patient reports that he has developed of diarrhea since being admitted last.  He reports he is having diarrhea about 10 times per day.  It is nonbloody.  It is causing him to have perianal soreness and tenderness.  He reports stomach cramping pain.  The pain is intermittent.  Usually occurs directly before bowel movement.  He does not always have control over the bowel movements and has to wear depends.  Has had no p.o. intake for 4 days but continues to have diarrhea.  He reports he only drinks a little bit of water with his meds in the morning.  He denies any fevers.  He has taken Imodium but he does not think it has helped with his diarrhea.  Patient reports he has had generalized weakness.  He used to be able to ambulate, independently go to the grocery store, and function as "a normal person."  Since having chemo he has been "a total wreck."  He reports he cannot walk, he bruised from falling.  He has right foot pain.  Patient is most concerned about this right foot pain.  He reports that it is painful all the time.  It is worse when he bears weight.  Is swollen compared to the left foot.  He describes the pain as sharp.  He reports that this has been going on for several days.  He does not associate it with any of his  falls.  Patient has no other complaints at this time.   Clinical Impression  Patient demonstrates slow labored movement for sitting up at bedside, unable to complete sit to stands using SPC due to weakness, required use of RW and able to ambulate to bathroom with slow labored movement.  Patient has mostly difficulty completing sit to stands due to BLE weakness and put back to bed secondary planning to receive blood transfusion due to low HGB.  Patient will benefit from continued skilled physical therapy in hospital and recommended venue below to increase strength, balance, endurance for safe ADLs and gait.          Assistance Recommended at Discharge Set up Supervision/Assistance  If plan is discharge home, recommend the following:  Can travel by private vehicle  A little help with walking and/or transfers;A little help with bathing/dressing/bathroom;Help with stairs or ramp for entrance;Assistance with cooking/housework   Yes    Equipment Recommendations Rolling walker (2 wheels)  Recommendations for Other Services       Functional Status Assessment Patient has had a recent decline in their functional status and demonstrates the ability to make significant improvements in function in a reasonable and predictable amount of time.     Precautions / Restrictions Precautions Precautions: Fall Restrictions Weight Bearing Restrictions: No      Mobility  Bed Mobility Overal bed  mobility: Needs Assistance Bed Mobility: Supine to Sit, Sit to Supine     Supine to sit: Min guard Sit to supine: Min guard, Min assist   General bed mobility comments: increased time, labored movement    Transfers Overall transfer level: Needs assistance Equipment used: Rolling walker (2 wheels) Transfers: Sit to/from Stand, Bed to chair/wheelchair/BSC Sit to Stand: Mod assist   Step pivot transfers: Min assist, Mod assist       General transfer comment: has most difficulty completing sit to  stands due to BLE weakness    Ambulation/Gait Ambulation/Gait assistance: Min assist Gait Distance (Feet): 15 Feet Assistive device: Rolling walker (2 wheels) Gait Pattern/deviations: Decreased step length - right, Decreased step length - left, Decreased stride length, Trunk flexed Gait velocity: slow     General Gait Details: slow labored cadence limited mostly due to c/o fatigue and mild lightheadeness  Stairs            Wheelchair Mobility     Tilt Bed    Modified Rankin (Stroke Patients Only)       Balance Overall balance assessment: Needs assistance Sitting-balance support: Feet supported, No upper extremity supported Sitting balance-Leahy Scale: Fair Sitting balance - Comments: fair/good seated at EOB   Standing balance support: During functional activity, Bilateral upper extremity supported Standing balance-Leahy Scale: Poor Standing balance comment: fair/poor using RW, unable to complete sit to stands with [facility]                             Pertinent Vitals/Pain Pain Assessment Pain Assessment: Faces Faces Pain Scale: Hurts little more Pain Location: increased pain anal area when being wiped Pain Descriptors / Indicators: Grimacing, Guarding, Sharp Pain Intervention(s): Limited activity within patient's tolerance, Monitored during session, Repositioned    Home Living Family/patient expects to be discharged to:: Private residence Living Arrangements: Non-relatives/Friends Available Help at Discharge: Family;Friend(s);Available PRN/intermittently Type of Home: House Home Access: Stairs to enter Entrance Stairs-Rails: None Entrance Stairs-Number of Steps: 3 Alternate Level Stairs-Number of Steps: flight Home Layout: Two level;Laundry or work area in basement;Able to live on main level with bedroom/bathroom Home Equipment: Gilmer Mor - single point;Standard Environmental consultant      Prior Function Prior Level of Function : Needs assist       Physical Assist  : Mobility (physical);ADLs (physical) Mobility (physical): Bed mobility;Transfers;Gait;Stairs   Mobility Comments: short household distances using SPC ADLs Comments: assisted by land lady per patient     Hand Dominance   Dominant Hand: Right    Extremity/Trunk Assessment   Upper Extremity Assessment Upper Extremity Assessment: Generalized weakness    Lower Extremity Assessment Lower Extremity Assessment: Generalized weakness    Cervical / Trunk Assessment Cervical / Trunk Assessment: Kyphotic  Communication   Communication: No difficulties  Cognition Arousal/Alertness: Awake/alert Behavior During Therapy: WFL for tasks assessed/performed Overall Cognitive Status: Within Functional Limits for tasks assessed                                          General Comments      Exercises     Assessment/Plan    PT Assessment Patient needs continued PT services  PT Problem List Decreased strength;Decreased activity tolerance;Decreased balance;Decreased mobility       PT Treatment Interventions DME instruction;Gait training;Stair training;Functional mobility training;Therapeutic activities;Therapeutic exercise;Patient/family education;Balance training    PT  Goals (Current goals can be found in the Care Plan section)  Acute Rehab PT Goals Patient Stated Goal: return home with landlady to assist PT Goal Formulation: With patient Time For Goal Achievement: 12/05/22 Potential to Achieve Goals: Good    Frequency Min 3X/week     Co-evaluation               AM-PAC PT "6 Clicks" Mobility  Outcome Measure Help needed turning from your back to your side while in a flat bed without using bedrails?: None Help needed moving from lying on your back to sitting on the side of a flat bed without using bedrails?: A Little Help needed moving to and from a bed to a chair (including a wheelchair)?: A Little Help needed standing up from a chair using your arms  (e.g., wheelchair or bedside chair)?: A Lot Help needed to walk in hospital room?: A Lot Help needed climbing 3-5 steps with a railing? : A Lot 6 Click Score: 16    End of Session   Activity Tolerance: Patient tolerated treatment well;Patient limited by fatigue Patient left: in bed;with call bell/phone within reach Nurse Communication: Mobility status PT Visit Diagnosis: Unsteadiness on feet (R26.81);Other abnormalities of gait and mobility (R26.89);Muscle weakness (generalized) (M62.81)    Time: 2353-6144 PT Time Calculation (min) (ACUTE ONLY): 36 min   Charges:   PT Evaluation $PT Eval Moderate Complexity: 1 Mod PT Treatments $Therapeutic Activity: 23-37 mins PT General Charges $$ ACUTE PT VISIT: 1 Visit         12:32 PM, 11/21/22 Ocie Bob, MPT Physical Therapist with [facility] [phone] office [phone] mobile phone

## 2022-11-22 DIAGNOSIS — N179 Acute kidney failure, unspecified: Secondary | ICD-10-CM | POA: Diagnosis not present

## 2022-11-22 DIAGNOSIS — Z7189 Other specified counseling: Secondary | ICD-10-CM | POA: Diagnosis not present

## 2022-11-22 DIAGNOSIS — I482 Chronic atrial fibrillation, unspecified: Secondary | ICD-10-CM | POA: Diagnosis not present

## 2022-11-22 DIAGNOSIS — Z515 Encounter for palliative care: Secondary | ICD-10-CM | POA: Diagnosis not present

## 2022-11-22 DIAGNOSIS — E860 Dehydration: Principal | ICD-10-CM

## 2022-11-22 DIAGNOSIS — Z66 Do not resuscitate: Secondary | ICD-10-CM | POA: Diagnosis not present

## 2022-11-22 DIAGNOSIS — D469 Myelodysplastic syndrome, unspecified: Secondary | ICD-10-CM | POA: Diagnosis not present

## 2022-11-22 LAB — CBC
HCT: 21.1 % — ABNORMAL LOW (ref 39.0–52.0)
Hemoglobin: 6.8 g/dL — CL (ref 13.0–17.0)
MCH: 33 pg (ref 26.0–34.0)
MCHC: 32.2 g/dL (ref 30.0–36.0)
MCV: 102.4 fL — ABNORMAL HIGH (ref 80.0–100.0)
Platelets: 132 10*3/uL — ABNORMAL LOW (ref 150–400)
RBC: 2.06 MIL/uL — ABNORMAL LOW (ref 4.22–5.81)
RDW: 18.8 % — ABNORMAL HIGH (ref 11.5–15.5)
WBC: 3.6 10*3/uL — ABNORMAL LOW (ref 4.0–10.5)
nRBC: 0 % (ref 0.0–0.2)

## 2022-11-22 LAB — BPAM RBC
Blood Product Expiration Date: 202408102359
ISSUE DATE / TIME: 202407030811

## 2022-11-22 LAB — BASIC METABOLIC PANEL
Anion gap: 6 (ref 5–15)
BUN: 40 mg/dL — ABNORMAL HIGH (ref 8–23)
CO2: 17 mmol/L — ABNORMAL LOW (ref 22–32)
Calcium: 7.7 mg/dL — ABNORMAL LOW (ref 8.9–10.3)
Chloride: 114 mmol/L — ABNORMAL HIGH (ref 98–111)
Creatinine, Ser: 1.65 mg/dL — ABNORMAL HIGH (ref 0.61–1.24)
GFR, Estimated: 44 mL/min — ABNORMAL LOW (ref >60)
Glucose, Bld: 101 mg/dL — ABNORMAL HIGH (ref 70–99)
Potassium: 3.5 mmol/L (ref 3.5–5.1)
Sodium: 137 mmol/L (ref 135–145)

## 2022-11-22 LAB — PATHOLOGIST SMEAR REVIEW

## 2022-11-22 LAB — TYPE AND SCREEN: Unit division: 0

## 2022-11-22 LAB — PREPARE RBC (CROSSMATCH)

## 2022-11-22 MED ORDER — FENTANYL CITRATE PF 50 MCG/ML IJ SOSY: 25.0000 ug | PREFILLED_SYRINGE | INTRAMUSCULAR | Status: DC | PRN

## 2022-11-22 MED ORDER — ALPRAZOLAM 1 MG PO TABS
1.0000 mg | ORAL_TABLET | Freq: Every day | ORAL | Status: DC
  Administered 2022-11-22 – 2022-11-28 (×7): 1 mg via ORAL
  Filled 2022-11-22 (×7): qty 1

## 2022-11-22 MED ORDER — SODIUM CHLORIDE 0.9% IV SOLUTION: Freq: Once | INTRAVENOUS | Status: AC

## 2022-11-22 MED ORDER — CARVEDILOL 3.125 MG PO TABS
3.1250 mg | ORAL_TABLET | Freq: Two times a day (BID) | ORAL | Status: DC
  Administered 2022-11-24 – 2022-11-29 (×9): 3.125 mg via ORAL
  Filled 2022-11-22 (×12): qty 1

## 2022-11-22 NOTE — Progress Notes (Signed)
PROGRESS NOTE   HONEST STARE  ZOX:[id number] DOB: 1950-08-21 DOA: 11/20/2022 PCP: Elfredia Nevins, MD   Chief Complaint  Patient presents with   Diarrhea   Level of care: Med-Surg  Brief Admission History:  72 y.o. male with medical history significant of AAA, atrial fibrillation, anxiety, BPH, CHF, hypertension, hypothyroidism, MDS, CAD, and more presents the ED with a chief complaint of generalized weakness.  Patient reports that he has had multiple falls, has not been able to get up off the ground, and has had generalized weakness.  Patient is currently on chemo.  He gets chemo for 7 days and then gets 28 days off.  His last chemo treatment was 2 weeks ago.  Patient has developed diarrhea since having chemo per chart review.  Patient reports that he has developed of diarrhea since being admitted last.  He reports he is having diarrhea about 10 times per day.  It is nonbloody.  It is causing him to have perianal soreness and tenderness.  He reports stomach cramping pain.  The pain is intermittent.  Usually occurs directly before bowel movement.  He does not always have control over the bowel movements and has to wear depends.  Has had no p.o. intake for 4 days but continues to have diarrhea.  He reports he only drinks a little bit of water with his meds in the morning.  He denies any fevers.  He has taken Imodium but he does not think it has helped with his diarrhea.  Patient reports he has had generalized weakness.  He used to be able to ambulate, independently go to the grocery store, and function as "a normal person."  Since having chemo he has been "a total wreck."  He reports he cannot walk, he bruised from falling.  He has right foot pain.  Patient is most concerned about this right foot pain.  He reports that it is painful all the time.  It is worse when he bears weight.  Is swollen compared to the left foot.  He describes the pain as sharp.  He reports that this has been going on for several  days.  He does not associate it with any of his falls.  Patient has no other complaints at this time.   Patient reports he quit smoking 2 weeks ago.  He does not drink alcohol.  He is vaccinated for COVID.   Patient does have ACP documents which specifically say he would not want his life to be prolonged if he had a condition that cannot be cured, if he becomes unconscious and his doctors determine that he will never regain consciousness, or if he suffers from advanced dementia.  He does not have a DNR on file.   Assessment and Plan: * Dehydration - Secondary to GI losses - 2 L in the ED - Continue IV hydration - Encourage p.o. fluid intake - Trend labs in the a.m.  Chronic anemia - Hemoglobin stable at 7.8 - Continue folic acid - Trend cbc  Right foot pain - Patient's main concern is his right foot pain - Minimal swelling - He has had multiple falls but does not think that he is specifically hurt the foot during the falls - X-ray right foot pending - Due to the swelling and the fact the patient is in cancer treatment, we will get a ultrasound DVT right lower extremity as well - Pain control with pain scale - Continue to monitor  Atrial fibrillation, chronic (HCC) - Continue amiodarone  and Coreg - Continue aspirin  Prolonged QT interval - History of prolonged QTc - Admission EKG pending - Monitor on telemetry  Diarrhea A/W NOROVIRUS - Last discharged on June 20 and patient reports he has had diarrhea since then - He did receive antibiotics during that hospitalization for UTI - Today C. difficile is negative - GI panel POSITIVE FOR NOROVIRUS - Imodium for symptom control - Full liquid diet - Continue to monitor  [name] - Dr. Kirtland Bouchard consulted from the ED recommended admission for hydration - Patient gets 7 days of chemo and then 28 days off - His last chemo was 2 weeks ago - CBC is currently stable - Continue to monitor  AKI (acute kidney  injury) (HCC) - Creatinine increased to 2.29 from 1.89 - Secondary to dehydration secondary to GI losses - Continue IV hydration - Hold nephrotoxic agents when possible - Trend   Essential hypertension - Continue Coreg   DVT prophylaxis: [place] heparin  Code Status: DNR  Family Communication:  Disposition: TBD    Consultants:  Palliative  Procedures:   Antimicrobials:     Subjective: Pt reporting that he had some "shivering" after blood transfusion.   Objective: Vitals:   11/22/22 0831 11/22/22 1042 11/22/22 1438 11/22/22 1635  BP: 123/72 115/63 102/70 (!) 146/94  Pulse: 63 66 98 (!) 110  Resp: 16 16 19  (!) 21  Temp: 98 F (36.7 C) 98.3 F (36.8 C) 97.6 F (36.4 C) 98.9 F (37.2 C)  TempSrc: Oral Oral Oral Oral  SpO2: 100% 96% 97% 100%  Weight:      Height:        Intake/Output Summary (Last 24 hours) at 11/22/2022 1753 Last data filed at 11/22/2022 1525 Gross per 24 hour  Intake 2002.35 ml  Output 300 ml  Net 1702.35 ml   Filed Weights   11/20/22 1754  Weight: 81.6 kg   Examination:  General exam: chronically ill appearing male, Appears calm and comfortable  Respiratory system: Clear to auscultation. Respiratory effort normal. Cardiovascular system: normal S1 & S2 heard. No JVD, murmurs, rubs, gallops or clicks. No pedal edema. Gastrointestinal system: Abdomen is nondistended, soft and nontender. No organomegaly or masses felt. Normal bowel sounds heard. Central nervous system: Alert and oriented. No focal neurological deficits. Extremities: Symmetric 5 x 5 power. Skin: No rashes, lesions or ulcers. Psychiatry: Judgement and insight appear normal. Mood & affect appropriate.   Data Reviewed: I have personally reviewed following labs and imaging studies  CBC: Recent Labs  Lab 11/20/22 1936 11/21/22 0417 11/22/22 0424  WBC 4.3 4.5 3.6*  NEUTROABS 3.1 2.8  --   HGB 7.8* 6.6* 6.8*  HCT 24.0* 21.1* 21.1*  MCV 103.9* 106.6* 102.4*  PLT 210 165 132*     Basic Metabolic Panel: Recent Labs  Lab 11/20/22 1936 11/21/22 0417 11/22/22 0424  NA 137 138 137  K 3.6 3.3* 3.5  CL 109 110 114*  CO2 18* 18* 17*  GLUCOSE 103* 99 101*  BUN 47* 43* 40*  CREATININE 2.29* 2.00* 1.65*  CALCIUM 8.4* 7.8* 7.7*  MG  --  2.2  --     CBG: No results for input(s): "GLUCAP" in the last 168 hours.  Recent Results (from the past 240 hour(s))  Gastrointestinal Panel by PCR , Stool     Status: Abnormal   Collection Time: 11/20/22  9:46 PM   Specimen: Stool  Result Value Ref Range Status   Campylobacter species NOT DETECTED NOT DETECTED Final  Plesimonas shigelloides NOT DETECTED NOT DETECTED Final   Salmonella species NOT DETECTED NOT DETECTED Final   Yersinia enterocolitica NOT DETECTED NOT DETECTED Final   Vibrio species NOT DETECTED NOT DETECTED Final   Vibrio cholerae NOT DETECTED NOT DETECTED Final   Enteroaggregative E coli (EAEC) NOT DETECTED NOT DETECTED Final   Enteropathogenic E coli (EPEC) NOT DETECTED NOT DETECTED Final   Enterotoxigenic E coli (ETEC) NOT DETECTED NOT DETECTED Final   Shiga like toxin producing E coli (STEC) NOT DETECTED NOT DETECTED Final   Shigella/Enteroinvasive E coli (EIEC) NOT DETECTED NOT DETECTED Final   Cryptosporidium NOT DETECTED NOT DETECTED Final   Cyclospora cayetanensis NOT DETECTED NOT DETECTED Final   Entamoeba histolytica NOT DETECTED NOT DETECTED Final   Giardia lamblia NOT DETECTED NOT DETECTED Final   Adenovirus F40/41 NOT DETECTED NOT DETECTED Final   Astrovirus NOT DETECTED NOT DETECTED Final   Norovirus GI/GII DETECTED (A) NOT DETECTED Final    Comment: RESULT CALLED TO, READ BACK BY AND VERIFIED WITH: VAL DILDY 11/21/22 1516 KLW    Rotavirus A NOT DETECTED NOT DETECTED Final   Sapovirus (I, II, IV, and V) NOT DETECTED NOT DETECTED Final    Comment: Performed at [facility], [address] [place], [place] [address]  C Difficile Quick Screen w PCR reflex     Status: None    Collection Time: 11/20/22  9:46 PM   Specimen: Stool  Result Value Ref Range Status   C Diff antigen NEGATIVE NEGATIVE Final   C Diff toxin NEGATIVE NEGATIVE Final   C Diff interpretation No C. difficile detected.  Final    Comment: Performed at [facility], [address] [place], [place] [address]     Radiology Studies: US Venous Img Lower Unilateral Right (DVT)  Result Date: 11/21/2022 CLINICAL DATA:  72 year old male with a history of foot pain EXAM: RIGHT LOWER EXTREMITY VENOUS DOPPLER ULTRASOUND TECHNIQUE: Gray-scale sonography with graded compression, as well as color Doppler and duplex ultrasound were performed to evaluate the lower extremity deep venous systems from the level of the common femoral vein and including the common femoral, femoral, profunda femoral, popliteal and calf veins including the posterior tibial, peroneal and gastrocnemius veins when visible. The superficial great saphenous vein was also interrogated. Spectral Doppler was utilized to evaluate flow at rest and with distal augmentation maneuvers in the common femoral, femoral and popliteal veins. COMPARISON:  None Available. FINDINGS: Contralateral Common Femoral Vein: Respiratory phasicity is normal and symmetric with the symptomatic side. No evidence of thrombus. Normal compressibility. Common Femoral Vein: No evidence of thrombus. Normal compressibility, respiratory phasicity and response to augmentation. Saphenofemoral Junction: No evidence of thrombus. Normal compressibility and flow on color Doppler imaging. Profunda Femoral Vein: No evidence of thrombus. Normal compressibility and flow on color Doppler imaging. Femoral Vein: No evidence of thrombus. Normal compressibility, respiratory phasicity and response to augmentation. Popliteal Vein: No evidence of thrombus. Normal compressibility, respiratory phasicity and response to augmentation. Calf Veins: No evidence of thrombus. Normal compressibility and flow on color  Doppler imaging. Superficial Great Saphenous Vein: No evidence of thrombus. Normal compressibility and flow on color Doppler imaging. Other Findings:  None. IMPRESSION: Directed duplex of the right lower extremity negative for DVT Signed, Yvone Neu. Miachel Roux, RPVI [facility] Radiology Electronically Signed   By: Gilmer Mor D.O.   On: 11/21/2022 09:04   DG Foot 2 Views Right  Result Date: 11/21/2022 CLINICAL DATA:  Right foot  pain and swelling, initial encounter EXAM: RIGHT FOOT - 2 VIEW COMPARISON:  None Available. FINDINGS: Mild calcaneal spurring is seen. Degenerative changes of the tibiotalar joint and tarsal bones are seen. Mild soft tissue swelling is noted about the metatarsals. No acute fracture or dislocation is seen. IMPRESSION: Mild degenerative change without acute bony abnormality. Electronically Signed   By: Alcide Clever M.D.   On: 11/21/2022 03:52    Scheduled Meds:  ALPRAZolam  1 mg Oral QHS   amiodarone  200 mg Oral BID   aspirin  81 mg Oral Q1400   carvedilol  3.125 mg Oral BID WC   feeding supplement  237 mL Oral BID BM   folic acid  1 mg Oral Q1400   heparin  5,000 Units Subcutaneous Q8H   levothyroxine  25 mcg Oral Q1400   tamsulosin  0.4 mg Oral Q1400   Continuous Infusions:  sodium chloride 125 mL/hr at 11/22/22 1044     LOS: 1 day   Time spent: 37 mins  Mellissa Conley Laural Benes, MD How to contact the [facility] Attending or Consulting provider 7A - 7P or covering provider during after hours 7P -7A, for this patient?  Check the care team in [facility] and look for a) attending/consulting [facility] provider listed and b) the [facility] team listed Log into [email or web address] and use [place]'s universal password to access. If you do not have the password, please contact the hospital operator. Locate the [facility] provider you are looking for under [facility] and page to a number that you can be directly reached. If you still have difficulty  reaching the provider, please page the [facility] (Director on Call) for the Hospitalists listed on amion for assistance.  11/22/2022, 5:53 PM

## 2022-11-22 NOTE — Hospital Course (Signed)
72 y.o. male with medical history significant of AAA, atrial fibrillation, anxiety, BPH, CHF, hypertension, hypothyroidism, MDS, CAD, and more presents the ED with a chief complaint of generalized weakness.  Patient reports that he has had multiple falls, has not been able to get up off the ground, and has had generalized weakness.  Patient is currently on chemo.  He gets chemo for 7 days and then gets 28 days off.  His last chemo treatment was 2 weeks ago.  Patient has developed diarrhea since having chemo per chart review.  Patient reports that he has developed of diarrhea since being admitted last.  He reports he is having diarrhea about 10 times per day.  It is nonbloody.  It is causing him to have perianal soreness and tenderness.  He reports stomach cramping pain.  The pain is intermittent.  Usually occurs directly before bowel movement.  He does not always have control over the bowel movements and has to wear depends.  Has had no p.o. intake for 4 days but continues to have diarrhea.  He reports he only drinks a little bit of water with his meds in the morning.  He denies any fevers.  He has taken Imodium but he does not think it has helped with his diarrhea.  Patient reports he has had generalized weakness.  He used to be able to ambulate, independently go to the grocery store, and function as "a normal person."  Since having chemo he has been "a total wreck."  He reports he cannot walk, he bruised from falling.  He has right foot pain.  Patient is most concerned about this right foot pain.  He reports that it is painful all the time.  It is worse when he bears weight.  Is swollen compared to the left foot.  He describes the pain as sharp.  He reports that this has been going on for several days.  He does not associate it with any of his falls.  Patient has no other complaints at this time.   Patient reports he quit smoking 2 weeks ago.  He does not drink alcohol.  He is vaccinated for COVID.   Patient  does have ACP documents which specifically say he would not want his life to be prolonged if he had a condition that cannot be cured, if he becomes unconscious and his doctors determine that he will never regain consciousness, or if he suffers from advanced dementia.  He does not have a DNR on file.

## 2022-11-22 NOTE — Progress Notes (Signed)
Date and time results received: 11/22/22 0630  Test: Hgb Critical Value: 6.8  Name of Provider Notified: Carren Rang and Laural Benes  Orders Received? Or Actions Taken?: Awaiting orders.

## 2022-11-22 NOTE — Consult Note (Signed)
Consultation Note Date: 11/22/2022   Patient Name: John Hicks  DOB: 1950/05/26  MRN: [id number]  Age / Sex: 72 y.o., male  PCP: Elfredia Nevins, MD Referring Physician: Cleora Fleet, MD  Reason for Consultation: Establishing goals of care  HPI/Patient Profile: 72 y.o. 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   Clinical Assessment and Goals of Care: Consult received and chart review completed. Kedron is a pleasant gentleman with significant disease and symptoms. He tells me that he has struggled since 3 days after his first chemotherapy treatment. He tells me that he knows his time here is limited. He is a man of great faith and he is praying about what he should do. He is contemplating foregoing further treatment and focusing on comfort and dignity. He endorses poor quality of life with weakness, falls, diarrhea. We discussed code status and he elects DNR. We discussed that if he wishes to focus on quality of life and no further cancer treatment then hospice can be an option to help support him. He would like more time to consider his options. I also explained that we can speak with Dr. Ellin Saba if there could be any other options for treatment that could be a possibility if he was interested.   We discussed his symptoms. He complains of overall pain with achy weakness generalized. He has not requested pain medication and I educated on PRN medication and letting RN know when he needed pain or anxiety medication. Pain medication may also assist with slowing transit time of his diarrhea. We discussed utilizing anxiety medication at night and I will schedule in case he forgets to ask for this. He requests chaplain visit for  additional support and prayer as he is considering his options and decisions.   All questions/concerns addressed. Emotional support provided.   Primary Decision Maker PATIENT    SUMMARY OF RECOMMENDATIONS   - DNR - Considering if he wants further treatment vs comfort care  Code Status/Advance Care Planning: DNR   Symptom Management:  Pain: OxyIR 5 mg q4h PRN. Anxiety: Xanax 1 mg at bedtime and TID PRN.  Psycho-social/Spiritual:  Desire for further Chaplaincy support:yes  Prognosis:  Overall prognosis poor. Eligible for hospice is comfort care is elected.   Discharge Planning: To Be Determined      Primary Diagnoses: Present on Admission:  Dehydration  Diarrhea  AKI (acute kidney injury) (HCC)  Prolonged QT interval  [name]  Atrial fibrillation, chronic (HCC)  Essential hypertension   I have reviewed the medical record, interviewed the patient and family, and examined the patient. The following aspects are pertinent.  Past Medical History:  Diagnosis Date   AAA (abdominal aortic aneurysm) (HCC)    AF (atrial fibrillation) (HCC)    Anxiety    Blood transfusion without reported diagnosis    BPH (benign prostatic hyperplasia)    CHF (  congestive heart failure) (HCC)    Chronic kidney disease    stage 3   Depression    Dyspnea    Dysrhythmia    Hypertension    Hypothyroid    Leukemia (HCC)    Myocardial infarction [facility])    Social History   Socioeconomic History   Marital status: Single    Spouse name: Not on file   Number of children: Not on file   Years of education: Not on file   Highest education level: Not on file  Occupational History   Occupation: Retired  Tobacco Use   Smoking status: Some Days    Packs/day: 1    Types: Cigarettes    Passive exposure: Current   Smokeless tobacco: Never   Tobacco comments:    ppd  Vaping Use   Vaping Use: Never used  Substance and Sexual Activity   Alcohol use: No   Drug  use: Not Currently    Types: Marijuana   Sexual activity: Not Currently  Other Topics Concern   Not on file  Social History Narrative   Pt lives w/ landlord and [place].   Social Determinants of Health   Financial Resource Strain: Not on file  Food Insecurity: No Food Insecurity (11/20/2022)   Hunger Vital Sign    Worried About Running Out of Food in the Last Year: Never true    Ran Out of Food in the Last Year: Never true  Transportation Needs: No Transportation Needs (11/20/2022)   PRAPARE - Administrator, Civil Service (Medical): No    Lack of Transportation (Non-Medical): No  Physical Activity: Not on file  Stress: Not on file  Social Connections: Not on file   Family History  Problem Relation Age of Onset   Stroke Father 48       went in the bathroom and collapsed   Multiple sclerosis Sister    Psychiatric Illness Mother 49   Scheduled Meds:  amiodarone  200 mg Oral BID   aspirin  81 mg Oral Q1400   feeding supplement  237 mL Oral BID BM   folic acid  1 mg Oral Q1400   heparin  5,000 Units Subcutaneous Q8H   levothyroxine  25 mcg Oral Q1400   tamsulosin  0.4 mg Oral Q1400   Continuous Infusions:  sodium chloride 125 mL/hr at 11/22/22 1044   PRN Meds:.acetaminophen **OR** acetaminophen, ALPRAZolam, loperamide, morphine injection, ondansetron **OR** ondansetron (ZOFRAN) IV, oxyCODONE Allergies  Allergen Reactions   Codeine Other (See Comments)    agitation   Review of Systems  Constitutional:  Positive for activity change, appetite change and fatigue.  Respiratory:  Negative for shortness of breath.   Gastrointestinal:  Positive for abdominal pain and diarrhea.  Musculoskeletal:        R foot pain  Neurological:  Positive for weakness.  Psychiatric/Behavioral:  The patient is nervous/anxious.     Physical Exam Vitals and nursing note reviewed.  Constitutional:      Appearance: He is cachectic. He is ill-appearing.  Cardiovascular:     Rate  and Rhythm: Normal rate.  Pulmonary:     Effort: No tachypnea, accessory muscle usage or respiratory distress.  Abdominal:     General: Abdomen is flat.     Tenderness: There is abdominal tenderness.  Neurological:     Mental Status: He is alert and oriented to person, place, and time.     Vital Signs: BP 115/63   Pulse 66   Temp 98.3  F (36.8 C) (Oral)   Resp 16   Ht 6' (1.829 m)   Wt 81.6 kg   SpO2 96%   BMI 24.41 kg/m  Pain Scale: 0-10   Pain Score: 0-No pain   SpO2: SpO2: 96 % O2 Device:SpO2: 96 % O2 Flow Rate: .   IO: Intake/output summary:  Intake/Output Summary (Last 24 hours) at 11/22/2022 1405 Last data filed at 11/22/2022 1038 Gross per 24 hour  Intake 2099.85 ml  Output 300 ml  Net 1799.85 ml    LBM: Last BM Date : 11/22/22 Baseline Weight: Weight: 81.6 kg Most recent weight: Weight: 81.6 kg     Palliative Assessment/Data:     Time Total: 75 min  Greater than 50%  of this time was spent counseling and coordinating care related to the above assessment and plan.  Signed by: Yong Channel, NP Palliative Medicine Team Pager # [phone] (M-F 8a-5p) Team Phone # [phone] (Nights/Weekends)

## 2022-11-23 DIAGNOSIS — Z7189 Other specified counseling: Secondary | ICD-10-CM | POA: Diagnosis not present

## 2022-11-23 DIAGNOSIS — N179 Acute kidney failure, unspecified: Secondary | ICD-10-CM | POA: Diagnosis not present

## 2022-11-23 DIAGNOSIS — I482 Chronic atrial fibrillation, unspecified: Secondary | ICD-10-CM | POA: Diagnosis not present

## 2022-11-23 DIAGNOSIS — D469 Myelodysplastic syndrome, unspecified: Secondary | ICD-10-CM | POA: Diagnosis not present

## 2022-11-23 DIAGNOSIS — Z515 Encounter for palliative care: Secondary | ICD-10-CM | POA: Diagnosis not present

## 2022-11-23 DIAGNOSIS — E860 Dehydration: Secondary | ICD-10-CM | POA: Diagnosis not present

## 2022-11-23 LAB — BASIC METABOLIC PANEL
Anion gap: 5 (ref 5–15)
BUN: 37 mg/dL — ABNORMAL HIGH (ref 8–23)
CO2: 18 mmol/L — ABNORMAL LOW (ref 22–32)
Calcium: 7.8 mg/dL — ABNORMAL LOW (ref 8.9–10.3)
Chloride: 114 mmol/L — ABNORMAL HIGH (ref 98–111)
Creatinine, Ser: 1.49 mg/dL — ABNORMAL HIGH (ref 0.61–1.24)
GFR, Estimated: 50 mL/min — ABNORMAL LOW (ref >60)
Glucose, Bld: 109 mg/dL — ABNORMAL HIGH (ref 70–99)
Potassium: 3.4 mmol/L — ABNORMAL LOW (ref 3.5–5.1)
Sodium: 137 mmol/L (ref 135–145)

## 2022-11-23 LAB — CBC
HCT: 23.5 % — ABNORMAL LOW (ref 39.0–52.0)
Hemoglobin: 7.5 g/dL — ABNORMAL LOW (ref 13.0–17.0)
MCH: 32.6 pg (ref 26.0–34.0)
MCHC: 31.9 g/dL (ref 30.0–36.0)
MCV: 102.2 fL — ABNORMAL HIGH (ref 80.0–100.0)
Platelets: 107 10*3/uL — ABNORMAL LOW (ref 150–400)
RBC: 2.3 MIL/uL — ABNORMAL LOW (ref 4.22–5.81)
RDW: 18.9 % — ABNORMAL HIGH (ref 11.5–15.5)
WBC: 3.7 10*3/uL — ABNORMAL LOW (ref 4.0–10.5)
nRBC: 0 % (ref 0.0–0.2)

## 2022-11-23 LAB — TYPE AND SCREEN
ABO/RH(D): O POS
Antibody Screen: NEGATIVE

## 2022-11-23 LAB — BPAM RBC
Blood Product Expiration Date: 202408082359
ISSUE DATE / TIME: 202407021201
Unit Type and Rh: 5100
Unit Type and Rh: 5100

## 2022-11-23 LAB — MAGNESIUM: Magnesium: 2 mg/dL (ref 1.7–2.4)

## 2022-11-23 MED ORDER — OXYCODONE HCL 5 MG PO TABS
5.0000 mg | ORAL_TABLET | ORAL | Status: DC | PRN
  Administered 2022-11-23 – 2022-11-25 (×5): 5 mg via ORAL
  Administered 2022-11-25 – 2022-11-28 (×8): 10 mg via ORAL
  Administered 2022-11-28: 5 mg via ORAL
  Administered 2022-11-28: 10 mg via ORAL
  Administered 2022-11-29: 5 mg via ORAL
  Filled 2022-11-23: qty 1
  Filled 2022-11-23 (×3): qty 2
  Filled 2022-11-23 (×3): qty 1
  Filled 2022-11-23 (×4): qty 2
  Filled 2022-11-23 (×2): qty 1
  Filled 2022-11-23 (×3): qty 2

## 2022-11-23 NOTE — TOC Progression Note (Addendum)
Transition of Care [facility]) - Progression Note    Patient Details  Name: John Hicks MRN: [id number] Date of Birth: 11-21-1950  Transition of [facility] CM/SW Contact  Leitha Bleak, RN Phone Number: 11/23/2022, 11:59 AM  Clinical Narrative:   Expanding the bed search, no offers. Palliative discussion will continue and Chemo is not schedule.  Health [facility] [employer] has approved the EMS transport # [phone] this is good for 90 day.  They will follow for bed offer and for him to be more stable to sent in notes for medical review and SNF AUTH. TOC following.   Addendum - [facility] called to offer peer to Peer with Dr Margaretann Loveless by noon tomorrow. [phone].  Information given to MD. He agreed patient now currently stable for discharge, due to pain, diarrhea and need for fluids. Patient has decided not to continue Chemo.  Decision made to cancel Auth and restart when we have a bed and patient is stable. Auth cancelled with Junious Dresser at [facility].  TOC following.   Expected Discharge Plan: Skilled Nursing Facility Barriers to Discharge: Continued Medical Work up  Expected Discharge Plan and Services       Living arrangements for the past 2 months: Single Family Home                      Social Determinants of Health (SDOH) Interventions SDOH Screenings   Food Insecurity: No Food Insecurity (11/20/2022)  Housing: Low Risk  (11/20/2022)  Transportation Needs: No Transportation Needs (11/20/2022)  Utilities: Not At Risk (11/20/2022)  Depression (PHQ2-9): Low Risk  (06/30/2022)  Tobacco Use: High Risk (11/20/2022)    Readmission Risk Interventions     No data to display

## 2022-11-23 NOTE — Progress Notes (Signed)
Palliative:  72 y.o. 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   I met again today with John Hicks. He is feeling a little better but still struggling with diarrhea and poor intake. Ongoing pain although anxiety is improved. May trial increased pain medication to see if he can have improved relief. John Hicks and I continue to discuss his goals of care. He expresses that he does not desire any more chemotherapy. He will have to consider if he desires to pursue any other treatment options (if available) with Dr. Ellin Saba. We discussed plan forward and hopes that norovirus can clear and he can pursue rehab stay. Hopefully he can improve to return to his home even if he elects hospice support. He does not have physical caregiver support but he does seem to have good social support. He finds strength through his faith and is praying for guidance for the right path forward for him.   All questions/concerns addressed. Emotional support provided. Discussed with Dr. Laural Benes.   Exam: Alert, oriented. Cachectic. Ill-appearing. Breathing regular, unlabored. Abd tender. Generalized weakness and fatigue. R foot tender and swollen.   Plan: - DNR - Does not desire more chemotherapy at this time - Will need f/u with outpatient palliative to further help discern his desire for treatment vs hospice  40 min  John Channel, NP Palliative Medicine Team Pager [phone] (Please see [email or web address] for schedule) Team Phone [phone]    Greater than 50%  of this time was spent counseling and coordinating care related to the above assessment and plan

## 2022-11-23 NOTE — Progress Notes (Signed)
PROGRESS NOTE   John Hicks  ZOX:[id number] DOB: [birth year] DOA: 11/20/2022 PCP: Elfredia Nevins, MD   Chief Complaint  Patient presents with   Diarrhea   Level of care: Med-Surg  Brief Admission History:  72 y.o. male with medical history significant of AAA, atrial fibrillation, anxiety, BPH, CHF, hypertension, hypothyroidism, MDS, CAD, and more presents the ED with a chief complaint of generalized weakness.  Patient reports that he has had multiple falls, has not been able to get up off the ground, and has had generalized weakness.  Patient is currently on chemo.  He gets chemo for 7 days and then gets 28 days off.  His last chemo treatment was 2 weeks ago.  Patient has developed diarrhea since having chemo per chart review.  Patient reports that he has developed of diarrhea since being admitted last.  He reports he is having diarrhea about 10 times per day.  It is nonbloody.  It is causing him to have perianal soreness and tenderness.  He reports stomach cramping pain.  The pain is intermittent.  Usually occurs directly before bowel movement.  He does not always have control over the bowel movements and has to wear depends.  Has had no p.o. intake for 4 days but continues to have diarrhea.  He reports he only drinks a little bit of water with his meds in the morning.  He denies any fevers.  He has taken Imodium but he does not think it has helped with his diarrhea.  Patient reports he has had generalized weakness.  He used to be able to ambulate, independently go to the grocery store, and function as "a normal person."  Since having chemo he has been "a total wreck."  He reports he cannot walk, he bruised from falling.  He has right foot pain.  Patient is most concerned about this right foot pain.  He reports that it is painful all the time.  It is worse when he bears weight.  Is swollen compared to the left foot.  He describes the pain as sharp.  He reports that this has been going on for several  days.  He does not associate it with any of his falls.  Patient has no other complaints at this time.   Patient reports he quit smoking 2 weeks ago.  He does not drink alcohol.  He is vaccinated for COVID.   Patient does have ACP documents which specifically say he would not want his life to be prolonged if he had a condition that cannot be cured, if he becomes unconscious and his doctors determine that he will never regain consciousness, or if he suffers from advanced dementia.  He does not have a DNR on file.   Assessment and Plan: Dehydration - Secondary to GI losses - 2 L in the ED - improved with IV hydration - Encourage p.o. fluid intake - creatinine trending down  Chronic anemia / thrombocytopenia - Hemoglobin stable at 7.5 after 1 unit PRBC given 7/3 - Continue folic acid - CBC in Am   Right foot pain - Patient's main concern is his right foot pain - Minimal swelling - He has had multiple falls but does not think that he is specifically hurt the foot during the falls - X-ray right foot - degenerative changes noted  - Due to the swelling and the fact the patient is in cancer treatment, we will get a ultrasound DVT right lower extremity as well -- NEG FOR DVT  -  Pain control with pain scale - Continue to monitor  Atrial fibrillation, chronic  - Continue amiodarone and Coreg - Continue aspirin  Prolonged QT interval - History of prolonged QTc  Diarrhea A/W NOROVIRUS - Last discharged on June 20 and patient reports he has had diarrhea since then - He did receive antibiotics during that hospitalization for UTI - Today C. difficile is negative - GI panel POSITIVE FOR NOROVIRUS - Imodium for symptom control - Full liquid diet--advance diet as tolerated to soft foods  - improving with supportive measures   MDS (myelodysplastic syndrome)  - Dr. Kirtland Bouchard consulted from the ED recommended admission for hydration - Patient gets 7 days of chemo and then 28 days off - His last chemo  was 2 weeks ago - pt does not want to do more chemo at this time - CBC is currently stable  AKI (acute kidney injury)  - Creatinine increased to 2.29 from 1.89 - Secondary to dehydration secondary to GI losses - Continue IV hydration - reduce rate to 60 cc/hr - Hold nephrotoxic agents when possible - creatinine down to 1.49  Essential hypertension - Continue Coreg   DVT prophylaxis: [place] heparin  Code Status: DNR  Family Communication:  Disposition: working on SNF placement, outpatient palliative care referral    Consultants:  Palliative  Procedures:   Antimicrobials:     Subjective: Pt says he decided with palliative team that he doesn't want to do anymore chemotherapy    Objective: Vitals:   11/23/22 0007 11/23/22 0518 11/23/22 0832 11/23/22 1424  BP: 104/64 100/62 126/72 113/68  Pulse: (!) 57 (!) 52 (!) 55 (!) 54  Resp: 18 18 16 16   Temp:  97.9 F (36.6 C) 97.6 F (36.4 C) (!) 97.5 F (36.4 C)  TempSrc:  Oral Oral Oral  SpO2: 98% 97% 99% 97%  Weight:      Height:        Intake/Output Summary (Last 24 hours) at 11/23/2022 1519 Last data filed at 11/23/2022 1014 Gross per 24 hour  Intake 840 ml  Output --  Net 840 ml   Filed Weights   11/20/22 1754  Weight: 81.6 kg   Examination:  General exam: chronically ill appearing male, Appears calm and comfortable  Respiratory system: Clear to auscultation. Respiratory effort normal. Cardiovascular system: normal S1 & S2 heard. No JVD, murmurs, rubs, gallops or clicks. No pedal edema. Gastrointestinal system: Abdomen is nondistended, soft and nontender. No organomegaly or masses felt. Normal bowel sounds heard. Central nervous system: Alert and oriented. No focal neurological deficits. Extremities: Symmetric 5 x 5 power. Skin: No rashes, lesions or ulcers. Psychiatry: Judgement and insight appear normal. Mood & affect appropriate.   Data Reviewed: I have personally reviewed following labs and imaging  studies  CBC: Recent Labs  Lab 11/20/22 1936 11/21/22 0417 11/22/22 0424 11/23/22 0415  WBC 4.3 4.5 3.6* 3.7*  NEUTROABS 3.1 2.8  --   --   HGB 7.8* 6.6* 6.8* 7.5*  HCT 24.0* 21.1* 21.1* 23.5*  MCV 103.9* 106.6* 102.4* 102.2*  PLT 210 165 132* 107*    Basic Metabolic Panel: Recent Labs  Lab 11/20/22 1936 11/21/22 0417 11/22/22 0424 11/23/22 0415  NA 137 138 137 137  K 3.6 3.3* 3.5 3.4*  CL 109 110 114* 114*  CO2 18* 18* 17* 18*  GLUCOSE 103* 99 101* 109*  BUN 47* 43* 40* 37*  CREATININE 2.29* 2.00* 1.65* 1.49*  CALCIUM 8.4* 7.8* 7.7* 7.8*  MG  --  2.2  --  2.0    CBG: No results for input(s): "GLUCAP" in the last 168 hours.  Recent Results (from the past 240 hour(s))  Gastrointestinal Panel by PCR , Stool     Status: Abnormal   Collection Time: 11/20/22  9:46 PM   Specimen: Stool  Result Value Ref Range Status   Campylobacter species NOT DETECTED NOT DETECTED Final   Plesimonas shigelloides NOT DETECTED NOT DETECTED Final   Salmonella species NOT DETECTED NOT DETECTED Final   Yersinia enterocolitica NOT DETECTED NOT DETECTED Final   Vibrio species NOT DETECTED NOT DETECTED Final   Vibrio cholerae NOT DETECTED NOT DETECTED Final   Enteroaggregative E coli (EAEC) NOT DETECTED NOT DETECTED Final   Enteropathogenic E coli (EPEC) NOT DETECTED NOT DETECTED Final   Enterotoxigenic E coli (ETEC) NOT DETECTED NOT DETECTED Final   Shiga like toxin producing E coli (STEC) NOT DETECTED NOT DETECTED Final   Shigella/Enteroinvasive E coli (EIEC) NOT DETECTED NOT DETECTED Final   Cryptosporidium NOT DETECTED NOT DETECTED Final   Cyclospora cayetanensis NOT DETECTED NOT DETECTED Final   Entamoeba histolytica NOT DETECTED NOT DETECTED Final   Giardia lamblia NOT DETECTED NOT DETECTED Final   Adenovirus F40/41 NOT DETECTED NOT DETECTED Final   Astrovirus NOT DETECTED NOT DETECTED Final   Norovirus GI/GII DETECTED (A) NOT DETECTED Final    Comment: RESULT CALLED TO, READ  BACK BY AND VERIFIED WITH: VAL DILDY 11/21/22 1516 KLW    Rotavirus A NOT DETECTED NOT DETECTED Final   Sapovirus (I, II, IV, and V) NOT DETECTED NOT DETECTED Final    Comment: Performed at [facility], [address] [place], [place] [address]  C Difficile Quick Screen w PCR reflex     Status: None   Collection Time: 11/20/22  9:46 PM   Specimen: Stool  Result Value Ref Range Status   C Diff antigen NEGATIVE NEGATIVE Final   C Diff toxin NEGATIVE NEGATIVE Final   C Diff interpretation No C. difficile detected.  Final    Comment: Performed at [facility], [address], [place], [place] [address]     Radiology Studies: No results found.  Scheduled Meds:  ALPRAZolam  1 mg Oral QHS   amiodarone  200 mg Oral BID   aspirin  81 mg Oral Q1400   carvedilol  3.125 mg Oral BID WC   feeding supplement  237 mL Oral BID BM   folic acid  1 mg Oral Q1400   heparin  5,000 Units Subcutaneous Q8H   levothyroxine  25 mcg Oral Q1400   tamsulosin  0.4 mg Oral Q1400   Continuous Infusions:  sodium chloride 75 mL/hr at 11/23/22 1028     LOS: 2 days   Time spent: 35 mins  Shunta Mclaurin Laural Benes, MD How to contact the [facility] Attending or Consulting provider 7A - 7P or covering provider during after hours 7P -7A, for this patient?  Check the care team in [facility] and look for a) attending/consulting TRH provider listed and b) the [facility] team listed Log into [email or web address] and use 's universal password to access. If you do not have the password, please contact the hospital operator. Locate the [facility] provider you are looking for under [facility] and page to a number that you can be directly reached. If you still have difficulty reaching the provider, please page the [facility] (Director on Call) for the Hospitalists listed on amion for assistance.  11/23/2022, 3:19 PM

## 2022-11-23 NOTE — Progress Notes (Signed)
Pt had several Bms today and ambulated to bathroom with a walker. Stage II wound assessed and cleansed and wound care provided. Pt remains A&O talking and resting in bed. Oxycodone given twice for right foot and abdominal pain. Pt is resting comfortably at this time with no complaints.

## 2022-11-23 NOTE — Care Management Important Message (Signed)
Important Message  Patient Details  Name: John Hicks MRN: [id number] Date of Birth: 15-Jan-1951   Medicare Important Message Given:  N/A - LOS <3 / Initial given by admissions     Corey Harold 11/23/2022, 11:19 AM

## 2022-11-24 DIAGNOSIS — Z7189 Other specified counseling: Secondary | ICD-10-CM

## 2022-11-24 DIAGNOSIS — D469 Myelodysplastic syndrome, unspecified: Secondary | ICD-10-CM

## 2022-11-24 DIAGNOSIS — Z515 Encounter for palliative care: Secondary | ICD-10-CM | POA: Diagnosis not present

## 2022-11-24 MED ORDER — PROSOURCE PLUS PO LIQD
60.0000 mL | Freq: Three times a day (TID) | ORAL | Status: DC
  Administered 2022-11-25 – 2022-11-29 (×12): 60 mL via ORAL
  Filled 2022-11-24 (×14): qty 60

## 2022-11-24 NOTE — Progress Notes (Signed)
   11/24/22 2154  Assess: MEWS Score  Temp 99.8 F (37.7 C)  BP 115/69  MAP (mmHg) 85  Pulse Rate (!) 124  Resp 20  SpO2 (!) 89 %  O2 Device Nasal Cannula  O2 Flow Rate (L/min) 2 L/min  Assess: MEWS Score  MEWS Temp 0  MEWS Systolic 0  MEWS Pulse 2  MEWS RR 0  MEWS LOC 0  MEWS Score 2  MEWS Score Color Yellow  Assess: if the MEWS score is Yellow or Red  Were vital signs taken at a resting state? Yes  Focused Assessment No change from prior assessment  Does the patient meet 2 or more of the SIRS criteria? No  MEWS guidelines implemented  Yes, yellow  Treat  MEWS Interventions Considered administering scheduled or prn medications/treatments as ordered  Take Vital Signs  Increase Vital Sign Frequency  Yellow: Q2hr x1, continue Q4hrs until patient remains green for 12hrs  Escalate  MEWS: Escalate Yellow: Discuss with charge nurse and consider notifying provider and/or RRT  Provider Notification  Provider Name/Title Adefeso  Date Provider Notified 11/24/22  Time Provider Notified 2200  Assess: SIRS CRITERIA  SIRS Temperature  0  SIRS Pulse 1  SIRS Respirations  0  SIRS WBC 0  SIRS Score Sum  1

## 2022-11-24 NOTE — Progress Notes (Signed)
PROGRESS NOTE   John Hicks  ZOX:[id number] DOB: February 15, 1951 DOA: 11/20/2022 PCP: Elfredia Nevins, MD   Chief Complaint  Patient presents with   Diarrhea   Level of care: Med-Surg  Brief Admission History:  72 y.o. male with medical history significant of AAA, atrial fibrillation, anxiety, BPH, CHF, hypertension, hypothyroidism, MDS, CAD, and more presents the ED with a chief complaint of generalized weakness.  Patient reports that he has had multiple falls, has not been able to get up off the ground, and has had generalized weakness.  Patient is currently on chemo.  He gets chemo for 7 days and then gets 28 days off.  His last chemo treatment was 2 weeks ago.  Patient has developed diarrhea since having chemo per chart review.  Patient reports that he has developed of diarrhea since being admitted last.  He reports he is having diarrhea about 10 times per day.  It is nonbloody.  It is causing him to have perianal soreness and tenderness.  He reports stomach cramping pain.  The pain is intermittent.  Usually occurs directly before bowel movement.  He does not always have control over the bowel movements and has to wear depends.  Has had no p.o. intake for 4 days but continues to have diarrhea.  He reports he only drinks a little bit of water with his meds in the morning.  He denies any fevers.  He has taken Imodium but he does not think it has helped with his diarrhea.  Patient reports he has had generalized weakness.  He used to be able to ambulate, independently go to the grocery store, and function as "a normal person."  Since having chemo he has been "a total wreck."  He reports he cannot walk, he bruised from falling.  He has right foot pain.  Patient is most concerned about this right foot pain.  He reports that it is painful all the time.  It is worse when he bears weight.  Is swollen compared to the left foot.  He describes the pain as sharp.  He reports that this has been going on for several  days.  He does not associate it with any of his falls.  Patient has no other complaints at this time.   Patient reports he quit smoking 2 weeks ago.  He does not drink alcohol.  He is vaccinated for COVID.   Patient does have ACP documents which specifically say he would not want his life to be prolonged if he had a condition that cannot be cured, if he becomes unconscious and his doctors determine that he will never regain consciousness, or if he suffers from advanced dementia.  He does not have a DNR on file.   Assessment and Plan: Dehydration - TREATED and RESOLVED  - Secondary to GI losses - 2 L in the ED - improved with IV hydration - Encourage p.o. fluid intake - creatinine trending down with treatment (now down to 1.49)   Chronic anemia / thrombocytopenia - Hemoglobin stable at 7.5 after 1 unit PRBC given 7/3 - Continue folic acid - Hg improved to 7.5 after transfusion    Right foot pain - Patient's main concern is his right foot pain - Minimal swelling - He has had multiple falls but does not think that he is specifically hurt the foot during the falls - X-ray right foot - degenerative changes noted  - Due to the swelling and the fact the patient is in cancer treatment, we  will get a ultrasound DVT right lower extremity as well -- NEG FOR DVT  - Pain control with pain scale - check uric acid with next lab draw to rule out gout   Atrial fibrillation, chronic  - Continue amiodarone and Coreg - Continue aspirin  Prolonged QT interval - History of prolonged QTc  Diarrhea A/W NOROVIRUS - Last discharged on June 20 and patient reports he has had diarrhea since then - He did receive antibiotics during that hospitalization for UTI - Today C. difficile is negative - GI panel POSITIVE FOR NOROVIRUS - Imodium for symptom control - Full liquid diet--advance diet as tolerated to soft foods  - improved with supportive measures   MDS (myelodysplastic syndrome)  - Dr. Kirtland Bouchard consulted  from the ED recommended admission for hydration - Patient gets 7 days of chemo and then 28 days off - His last chemo was 2 weeks ago - pt does not want to do more chemo at this time - Hg 7.5   AKI (acute kidney injury)  - Creatinine increased to 2.29 from 1.89 - Secondary to dehydration secondary to GI losses - Continue IV hydration - reduce rate to 60 cc/hr - Hold nephrotoxic agents when possible - creatinine down to 1.49  Essential hypertension - Continue Coreg   DVT prophylaxis: [place] heparin  Code Status: DNR  Family Communication:  Disposition: working on SNF placement, outpatient palliative care referral    Consultants:  Palliative  Procedures:   Antimicrobials:     Subjective: Pt reports no significant changes and afraid to leave the hospital, still does not want anymore chemotherapy   Objective: Vitals:   11/23/22 2023 11/24/22 0503 11/24/22 0815 11/24/22 1307  BP: 117/71 104/62  120/67  Pulse: 66 61 61 66  Resp: 18 18  17   Temp: 97.9 F (36.6 C) 98.1 F (36.7 C)  97.9 F (36.6 C)  TempSrc:      SpO2: 99% 98% 98% 97%  Weight:      Height:        Intake/Output Summary (Last 24 hours) at 11/24/2022 1532 Last data filed at 11/24/2022 0610 Gross per 24 hour  Intake 480 ml  Output 650 ml  Net -170 ml   Filed Weights   11/20/22 1754  Weight: 81.6 kg   Examination:  General exam: chronically ill appearing male, Appears calm and comfortable  Respiratory system: Clear to auscultation. Respiratory effort normal. Cardiovascular system: normal S1 & S2 heard. No JVD, murmurs, rubs, gallops or clicks. No pedal edema. Gastrointestinal system: Abdomen is nondistended, soft and nontender. No organomegaly or masses felt. Normal bowel sounds heard. Central nervous system: Alert and oriented. No focal neurological deficits. Extremities: Symmetric 5 x 5 power. Skin: No rashes, lesions or ulcers. Psychiatry: Judgement and insight appear normal. Mood & affect  appropriate.   Data Reviewed: I have personally reviewed following labs and imaging studies  CBC: Recent Labs  Lab 11/20/22 1936 11/21/22 0417 11/22/22 0424 11/23/22 0415  WBC 4.3 4.5 3.6* 3.7*  NEUTROABS 3.1 2.8  --   --   HGB 7.8* 6.6* 6.8* 7.5*  HCT 24.0* 21.1* 21.1* 23.5*  MCV 103.9* 106.6* 102.4* 102.2*  PLT 210 165 132* 107*    Basic Metabolic Panel: Recent Labs  Lab 11/20/22 1936 11/21/22 0417 11/22/22 0424 11/23/22 0415  NA 137 138 137 137  K 3.6 3.3* 3.5 3.4*  CL 109 110 114* 114*  CO2 18* 18* 17* 18*  GLUCOSE 103* 99 101* 109*  BUN 47*  43* 40* 37*  CREATININE 2.29* 2.00* 1.65* 1.49*  CALCIUM 8.4* 7.8* 7.7* 7.8*  MG  --  2.2  --  2.0    CBG: No results for input(s): "GLUCAP" in the last 168 hours.  Recent Results (from the past 240 hour(s))  Gastrointestinal Panel by PCR , Stool     Status: Abnormal   Collection Time: 11/20/22  9:46 PM   Specimen: Stool  Result Value Ref Range Status   Campylobacter species NOT DETECTED NOT DETECTED Final   Plesimonas shigelloides NOT DETECTED NOT DETECTED Final   Salmonella species NOT DETECTED NOT DETECTED Final   Yersinia enterocolitica NOT DETECTED NOT DETECTED Final   Vibrio species NOT DETECTED NOT DETECTED Final   Vibrio cholerae NOT DETECTED NOT DETECTED Final   Enteroaggregative E coli (EAEC) NOT DETECTED NOT DETECTED Final   Enteropathogenic E coli (EPEC) NOT DETECTED NOT DETECTED Final   Enterotoxigenic E coli (ETEC) NOT DETECTED NOT DETECTED Final   Shiga like toxin producing E coli (STEC) NOT DETECTED NOT DETECTED Final   Shigella/Enteroinvasive E coli (EIEC) NOT DETECTED NOT DETECTED Final   Cryptosporidium NOT DETECTED NOT DETECTED Final   Cyclospora cayetanensis NOT DETECTED NOT DETECTED Final   Entamoeba histolytica NOT DETECTED NOT DETECTED Final   Giardia lamblia NOT DETECTED NOT DETECTED Final   Adenovirus F40/41 NOT DETECTED NOT DETECTED Final   Astrovirus NOT DETECTED NOT DETECTED Final    Norovirus GI/GII DETECTED (A) NOT DETECTED Final    Comment: RESULT CALLED TO, READ BACK BY AND VERIFIED WITH: VAL DILDY 11/21/22 1516 KLW    Rotavirus A NOT DETECTED NOT DETECTED Final   Sapovirus (I, II, IV, and V) NOT DETECTED NOT DETECTED Final    Comment: Performed at [facility], [address] [place], [place] [address]  C Difficile Quick Screen w PCR reflex     Status: None   Collection Time: 11/20/22  9:46 PM   Specimen: Stool  Result Value Ref Range Status   C Diff antigen NEGATIVE NEGATIVE Final   C Diff toxin NEGATIVE NEGATIVE Final   C Diff interpretation No C. difficile detected.  Final    Comment: Performed at [facility], [address] [place], [place] [address]     Radiology Studies: No results found.  Scheduled Meds:  (feeding supplement) PROSource Plus  60 mL Oral TID BM   ALPRAZolam  1 mg Oral QHS   amiodarone  200 mg Oral BID   aspirin  81 mg Oral Q1400   carvedilol  3.125 mg Oral BID WC   feeding supplement  237 mL Oral BID BM   folic acid  1 mg Oral Q1400   heparin  5,000 Units Subcutaneous Q8H   levothyroxine  25 mcg Oral Q1400   tamsulosin  0.4 mg Oral Q1400   Continuous Infusions:  sodium chloride 60 mL/hr at 11/24/22 0011     LOS: 3 days   Time spent: 35 mins  Johntae Broxterman Laural Benes, MD How to contact the [facility] Attending or Consulting provider 7A - 7P or covering provider during after hours 7P -7A, for this patient?  Check the care team in [facility] and look for a) attending/consulting TRH provider listed and b) the [facility] team listed Log into [email or web address] and use [place]'s universal password to access. If you do not have the password, please contact the hospital operator. Locate the [facility] provider you are looking for under [facility] and page to a number that you can be directly reached.  If you still have difficulty reaching the provider, please page the [facility] (Director on Call) for the Hospitalists listed on amion for  assistance.  11/24/2022, 3:32 PM

## 2022-11-24 NOTE — Progress Notes (Signed)
Dressing changed to sacral area. Prn medication given x2 during this shift.

## 2022-11-24 NOTE — TOC Progression Note (Signed)
Transition of Care [facility]) - Progression Note    Patient Details  Name: John Hicks MRN: [id number] Date of Birth: Nov 29, 1950  Transition of Care [facility]) CM/SW Contact  Catalina Gravel, [place] Phone Number: 11/24/2022, 4:16 PM  Clinical Narrative:    CSW spoke with pt on potential dc plan and two bed offers.  Pt seemed concerned that he is not ready to leave hospital and would not accept a bed yet. CSW followed up with MD who stated pt is medically ready for SNF.  CSW contacted Ins. Provider, left message, then called Supervisor after no return call. Pt auth was not "cancelled" but denied per supervisor, sup took info and stated that she will call the on call person and see if she can activate the original auth since denial not entered yet.  Eventually a faxed denial was received.    CSW received a secure chat that cousin Dorna Mai wants to speak with me [phone]- Pt lives in their in-law suite in [place], requesting a facility in [place] or [place] as closer for his wife to visit. 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 TOC to follow.  Expected Discharge Plan: Skilled Nursing Facility Barriers to Discharge: Continued Medical Work up  Expected Discharge Plan and Services       Living arrangements for the past 2 months: Single Family Home                                       Social Determinants of Health (SDOH) Interventions SDOH Screenings   Food Insecurity: No Food Insecurity (11/20/2022)  Housing: Low Risk  (11/20/2022)  Transportation Needs: No Transportation Needs (11/20/2022)  Utilities: Not At Risk (11/20/2022)  Depression (PHQ2-9): Low Risk  (06/30/2022)  Tobacco Use: High Risk (11/20/2022)    Readmission Risk Interventions     No data to display

## 2022-11-24 NOTE — Care Management Important Message (Signed)
Important Message  Patient Details  Name: John Hicks MRN: [id number] Date of Birth: 1951/01/04   Medicare Important Message Given:  Yes (spoke with Onalee Hua by phone at [phone] to explain letter, no additonal copy needed)     Corey Harold 11/24/2022, 1:47 PM

## 2022-11-24 NOTE — Consult Note (Incomplete)
   [facility] CM Inpatient Consult   [facility]) Accountable Care Organization (ACO) [facility] Liaison Note  11/24/2022  John Hicks [birth year] [id number]  Location: RN Hospital Liaison screened the patient remotely at [facility].  Insurance: [employer]   John Hicks is a 72 y.o. male who is a Primary Care Patient of Elfredia Nevins, MD. The patient was screened for 30 day readmission hospitalization with noted [facility] Liaison Readmission Risk Score:[id number]} risk score for unplanned readmission risk with **IP/**ED in 6 months.  The patient was assessed for potential [facility]) Care Management service needs for post hospital transition for care coordination. Review of patient's electronic medical record reveals patient is ***. Patient was given an appointment reminder card and 24 hour Nurse Advice Line magnet.   Plan: [facility] Liaison will continue to follow progress and disposition to asess for post hospital community care coordination/management needs.  Referral request for community care coordination: {CHL AMB [facility] Pending Disposition or [facility] TOC Z/H:[id number]}   [facility] Care Management/Population Health does not replace or interfere with any arrangements made by the Inpatient Transition of Care team.   For questions contact:   ***   .

## 2022-11-25 ENCOUNTER — Inpatient Hospital Stay (HOSPITAL_COMMUNITY): Payer: PPO

## 2022-11-25 DIAGNOSIS — Z515 Encounter for palliative care: Secondary | ICD-10-CM | POA: Diagnosis not present

## 2022-11-25 DIAGNOSIS — D469 Myelodysplastic syndrome, unspecified: Secondary | ICD-10-CM | POA: Diagnosis not present

## 2022-11-25 DIAGNOSIS — Z7189 Other specified counseling: Secondary | ICD-10-CM | POA: Diagnosis not present

## 2022-11-25 LAB — URINALYSIS, ROUTINE W REFLEX MICROSCOPIC
Bacteria, UA: NONE SEEN
Bilirubin Urine: NEGATIVE
Glucose, UA: NEGATIVE mg/dL
Hgb urine dipstick: NEGATIVE
Ketones, ur: NEGATIVE mg/dL
Leukocytes,Ua: NEGATIVE
Nitrite: NEGATIVE
Protein, ur: 30 mg/dL — AB
Specific Gravity, Urine: 1.016 (ref 1.005–1.030)
pH: 5 (ref 5.0–8.0)

## 2022-11-25 LAB — CBC
HCT: 23.5 % — ABNORMAL LOW (ref 39.0–52.0)
Hemoglobin: 7.3 g/dL — ABNORMAL LOW (ref 13.0–17.0)
MCH: 31.9 pg (ref 26.0–34.0)
MCHC: 31.1 g/dL (ref 30.0–36.0)
MCV: 102.6 fL — ABNORMAL HIGH (ref 80.0–100.0)
Platelets: 93 10*3/uL — ABNORMAL LOW (ref 150–400)
RBC: 2.29 MIL/uL — ABNORMAL LOW (ref 4.22–5.81)
RDW: 18.3 % — ABNORMAL HIGH (ref 11.5–15.5)
WBC: 3.1 10*3/uL — ABNORMAL LOW (ref 4.0–10.5)
nRBC: 0 % (ref 0.0–0.2)

## 2022-11-25 LAB — URIC ACID: Uric Acid, Serum: 4.2 mg/dL (ref 3.7–8.6)

## 2022-11-25 LAB — CULTURE, BLOOD (SINGLE)

## 2022-11-25 MED ORDER — SODIUM CHLORIDE 0.9 % IV BOLUS
500.0000 mL | Freq: Once | INTRAVENOUS | Status: AC
  Administered 2022-11-25: 500 mL via INTRAVENOUS

## 2022-11-25 NOTE — Progress Notes (Signed)
PROGRESS NOTE   John Hicks  AVW:[id number] DOB: [birth year] DOA: 11/20/2022 PCP: Elfredia Nevins, MD   Chief Complaint  Patient presents with   Diarrhea   Level of care: Med-Surg  Brief Admission History:  72 y.o. male with medical history significant of AAA, atrial fibrillation, anxiety, BPH, CHF, hypertension, hypothyroidism, MDS, CAD, and more presents the ED with a chief complaint of generalized weakness.  Patient reports that he has had multiple falls, has not been able to get up off the ground, and has had generalized weakness.  Patient is currently on chemo.  He gets chemo for 7 days and then gets 28 days off.  His last chemo treatment was 2 weeks ago.  Patient has developed diarrhea since having chemo per chart review.  Patient reports that he has developed of diarrhea since being admitted last.  He reports he is having diarrhea about 10 times per day.  It is nonbloody.  It is causing him to have perianal soreness and tenderness.  He reports stomach cramping pain.  The pain is intermittent.  Usually occurs directly before bowel movement.  He does not always have control over the bowel movements and has to wear depends.  Has had no p.o. intake for 4 days but continues to have diarrhea.  He reports he only drinks a little bit of water with his meds in the morning.  He denies any fevers.  He has taken Imodium but he does not think it has helped with his diarrhea.  Patient reports he has had generalized weakness.  He used to be able to ambulate, independently go to the grocery store, and function as "a normal person."  Since having chemo he has been "a total wreck."  He reports he cannot walk, he bruised from falling.  He has right foot pain.  Patient is most concerned about this right foot pain.  He reports that it is painful all the time.  It is worse when he bears weight.  Is swollen compared to the left foot.  He describes the pain as sharp.  He reports that this has been going on for several  days.  He does not associate it with any of his falls.  Patient has no other complaints at this time.   Patient reports he quit smoking 2 weeks ago.  He does not drink alcohol.  He is vaccinated for COVID.   Patient does have ACP documents which specifically say he would not want his life to be prolonged if he had a condition that cannot be cured, if he becomes unconscious and his doctors determine that he will never regain consciousness, or if he suffers from advanced dementia.  He does not have a DNR on file.   Assessment and Plan: Dehydration - TREATED and RESOLVED  - Secondary to GI losses - 2 L in the ED - improved with IV hydration - Encourage p.o. fluid intake - creatinine trending down with treatment (now down to 1.49)   Chronic anemia / thrombocytopenia - Hemoglobin stable at 7.5 after 1 unit PRBC given 7/3 - Continue folic acid - Hg improved to 7.5 after transfusion    Right foot pain - improved - Patient's main concern is his right foot pain - Minimal swelling - He has had multiple falls but does not think that he is specifically hurt the foot during the falls - X-ray right foot - degenerative changes noted  - Due to the swelling and the fact the patient is in cancer  treatment, we will get a ultrasound DVT right lower extremity as well -- NEG FOR DVT  - Pain control with pain scale - check uric acid - 4.5 within normal limits   Atrial fibrillation, chronic  - Continue amiodarone and Coreg - Continue aspirin  Prolonged QT interval - History of prolonged QTc  Diarrhea A/W NOROVIRUS - Last discharged on June 20 and patient reports he has had diarrhea since then - He did receive antibiotics during that hospitalization for UTI - Today C. difficile is negative - GI panel POSITIVE FOR NOROVIRUS - Imodium for symptom control - Full liquid diet--advance diet as tolerated to soft foods  - improved with supportive measures   MDS (myelodysplastic syndrome)  - Dr. Kirtland Bouchard  consulted from the ED recommended admission for hydration - Patient gets 7 days of chemo and then 28 days off - His last chemo was 2 weeks ago - pt does not want to do more chemo at this time - Hg 7.3-7.5   AKI (acute kidney injury)  - Creatinine increased to 2.29 from 1.89 - Secondary to dehydration secondary to GI losses - Continue IV hydration - reduce rate to 60 cc/hr - Hold nephrotoxic agents when possible - creatinine down to 1.49  Essential hypertension - Continue Coreg   DVT prophylaxis: [place] heparin  Code Status: DNR  Family Communication:  Disposition: working on SNF placement, outpatient palliative care referral    Consultants:  Palliative  Procedures:   Antimicrobials:     Subjective: Pt had a fever this morning, had worries about SNF   Objective: Vitals:   11/25/22 0204 11/25/22 0531 11/25/22 1028 11/25/22 1306  BP: (!) 84/46 108/65 100/60 121/65  Pulse:  (!) 57 65 62  Resp:  20 16 17   Temp:  97.6 F (36.4 C) 97.8 F (36.6 C) 97.6 F (36.4 C)  TempSrc:    Oral  SpO2:  96% 96% 95%  Weight:      Height:        Intake/Output Summary (Last 24 hours) at 11/25/2022 1458 Last data filed at 11/25/2022 1026 Gross per 24 hour  Intake 250 ml  Output 650 ml  Net -400 ml   Filed Weights   11/20/22 1754  Weight: 81.6 kg   Examination:  General exam: chronically ill appearing male, Appears calm and comfortable  Respiratory system: Clear to auscultation. Respiratory effort normal. Cardiovascular system: normal S1 & S2 heard. No JVD, murmurs, rubs, gallops or clicks. No pedal edema. Gastrointestinal system: Abdomen is nondistended, soft and nontender. No organomegaly or masses felt. Normal bowel sounds heard. Central nervous system: Alert and oriented. No focal neurological deficits. Extremities: Symmetric 5 x 5 power. Skin: No rashes, lesions or ulcers. Psychiatry: Judgement and insight appear normal. Mood & affect appropriate.   Data Reviewed: I have  personally reviewed following labs and imaging studies  CBC: Recent Labs  Lab 11/20/22 1936 11/21/22 0417 11/22/22 0424 11/23/22 0415 11/25/22 0446  WBC 4.3 4.5 3.6* 3.7* 3.1*  NEUTROABS 3.1 2.8  --   --   --   HGB 7.8* 6.6* 6.8* 7.5* 7.3*  HCT 24.0* 21.1* 21.1* 23.5* 23.5*  MCV 103.9* 106.6* 102.4* 102.2* 102.6*  PLT 210 165 132* 107* 93*    Basic Metabolic Panel: Recent Labs  Lab 11/20/22 1936 11/21/22 0417 11/22/22 0424 11/23/22 0415  NA 137 138 137 137  K 3.6 3.3* 3.5 3.4*  CL 109 110 114* 114*  CO2 18* 18* 17* 18*  GLUCOSE 103* 99 101*  109*  BUN 47* 43* 40* 37*  CREATININE 2.29* 2.00* 1.65* 1.49*  CALCIUM 8.4* 7.8* 7.7* 7.8*  MG  --  2.2  --  2.0    CBG: No results for input(s): "GLUCAP" in the last 168 hours.  Recent Results (from the past 240 hour(s))  Gastrointestinal Panel by PCR , Stool     Status: Abnormal   Collection Time: 11/20/22  9:46 PM   Specimen: Stool  Result Value Ref Range Status   Campylobacter species NOT DETECTED NOT DETECTED Final   Plesimonas shigelloides NOT DETECTED NOT DETECTED Final   Salmonella species NOT DETECTED NOT DETECTED Final   Yersinia enterocolitica NOT DETECTED NOT DETECTED Final   Vibrio species NOT DETECTED NOT DETECTED Final   Vibrio cholerae NOT DETECTED NOT DETECTED Final   Enteroaggregative E coli (EAEC) NOT DETECTED NOT DETECTED Final   Enteropathogenic E coli (EPEC) NOT DETECTED NOT DETECTED Final   Enterotoxigenic E coli (ETEC) NOT DETECTED NOT DETECTED Final   Shiga like toxin producing E coli (STEC) NOT DETECTED NOT DETECTED Final   Shigella/Enteroinvasive E coli (EIEC) NOT DETECTED NOT DETECTED Final   Cryptosporidium NOT DETECTED NOT DETECTED Final   Cyclospora cayetanensis NOT DETECTED NOT DETECTED Final   Entamoeba histolytica NOT DETECTED NOT DETECTED Final   Giardia lamblia NOT DETECTED NOT DETECTED Final   Adenovirus F40/41 NOT DETECTED NOT DETECTED Final   Astrovirus NOT DETECTED NOT DETECTED  Final   Norovirus GI/GII DETECTED (A) NOT DETECTED Final    Comment: RESULT CALLED TO, READ BACK BY AND VERIFIED WITH: VAL DILDY 11/21/22 1516 KLW    Rotavirus A NOT DETECTED NOT DETECTED Final   Sapovirus (I, II, IV, and V) NOT DETECTED NOT DETECTED Final    Comment: Performed at [facility], [address] [place], [place] [address]  C Difficile Quick Screen w PCR reflex     Status: None   Collection Time: 11/20/22  9:46 PM   Specimen: Stool  Result Value Ref Range Status   C Diff antigen NEGATIVE NEGATIVE Final   C Diff toxin NEGATIVE NEGATIVE Final   C Diff interpretation No C. difficile detected.  Final    Comment: Performed at [facility], [address] [place], [place] [address]     Radiology Studies: DG CHEST PORT 1 VIEW  Result Date: 11/25/2022 CLINICAL DATA:  Fever EXAM: PORTABLE CHEST 1 VIEW COMPARISON:  11/06/2022 FINDINGS: Right-sided chest port remains in place. Stable mild cardiomegaly. Aortic atherosclerosis. Coarsened interstitial markings bilaterally. No lobar consolidation. No pleural effusion or pneumothorax. IMPRESSION: Coarsened interstitial markings bilaterally, which may reflect bronchitic type lung changes. No lobar consolidation. Electronically Signed   By: Duanne Guess D.O.   On: 11/25/2022 12:47    Scheduled Meds:  (feeding supplement) PROSource Plus  60 mL Oral TID BM   ALPRAZolam  1 mg Oral QHS   amiodarone  200 mg Oral BID   aspirin  81 mg Oral Q1400   carvedilol  3.125 mg Oral BID WC   feeding supplement  237 mL Oral BID BM   folic acid  1 mg Oral Q1400   heparin  5,000 Units Subcutaneous Q8H   levothyroxine  25 mcg Oral Q1400   tamsulosin  0.4 mg Oral Q1400   Continuous Infusions:  sodium chloride 60 mL/hr at 11/24/22 0011     LOS: 4 days   Time spent: 35 mins  Daylan Juhnke Laural Benes, MD How to contact the [facility] Attending or Consulting provider 7A - 7P  or covering provider during after hours 7P -7A, for this patient?  Check the care  team in [facility] and look for a) attending/consulting TRH provider listed and b) the [facility] team listed Log into [email or web address] and use [place]'s universal password to access. If you do not have the password, please contact the hospital operator. Locate the [facility] provider you are looking for under [facility] and page to a number that you can be directly reached. If you still have difficulty reaching the provider, please page the [facility] (Director on Call) for the Hospitalists listed on amion for assistance.  11/25/2022, 2:58 PM

## 2022-11-25 NOTE — TOC Progression Note (Addendum)
Transition of Care [facility]) - Progression Note    Patient Details  Name: John Hicks MRN: [id number] Date of Birth: [birth year]  Transition of Care [facility]) CM/SW Contact  Catalina Gravel, [place] Phone Number: 11/25/2022, 1:48 PM  Clinical Narrative:    CSW called HTA to initiate auth.  Left VM with call back information. HTA called back and took information.  CSW contacted pt.  Very concerned about DC soon as he is not feeling ready, then shared his mom died at a facility so he may be just thinking about that.  Pt agreed to CV in [place] close to family.  CSW then received a call from contact who is actually cousin and landlord-not brother as he previously stated 7/5. They want him close to [facility]. Also the want health care proxy and will talk to pt about it.   CSW text to Eunice Blase advised pt accepting bed, pending insurance auth. TOC to follow.   Addendum: HTA called back- need updated PT note. CSW sent secure chat to MD for new evaluation/update.  Expected Discharge Plan: Skilled Nursing Facility Barriers to Discharge: Insurance Authorization  Expected Discharge Plan and Services       Living arrangements for the past 2 months: Single Family Home                                       Social Determinants of Health (SDOH) Interventions SDOH Screenings   Food Insecurity: No Food Insecurity (11/20/2022)  Housing: Low Risk  (11/20/2022)  Transportation Needs: No Transportation Needs (11/20/2022)  Utilities: Not At Risk (11/20/2022)  Depression (PHQ2-9): Low Risk  (06/30/2022)  Tobacco Use: High Risk (11/20/2022)    Readmission Risk Interventions     No data to display

## 2022-11-26 DIAGNOSIS — D469 Myelodysplastic syndrome, unspecified: Secondary | ICD-10-CM | POA: Diagnosis not present

## 2022-11-26 DIAGNOSIS — Z7189 Other specified counseling: Secondary | ICD-10-CM | POA: Diagnosis not present

## 2022-11-26 DIAGNOSIS — Z515 Encounter for palliative care: Secondary | ICD-10-CM | POA: Diagnosis not present

## 2022-11-26 NOTE — Progress Notes (Signed)
Nurse changed sacral wound dressing this morning and bathed patient, while bathing patient nurse noticed wound under scrotum with tunneling. Wound has slough around the edges with foul odor.

## 2022-11-26 NOTE — Progress Notes (Signed)
PROGRESS NOTE   John Hicks  ZOX:[id number] DOB: 30-Oct-1950 DOA: 11/20/2022 PCP: Elfredia Nevins, MD   Chief Complaint  Patient presents with   Diarrhea   Level of care: Med-Surg  Brief Admission History:  72 y.o. male with medical history significant of AAA, atrial fibrillation, anxiety, BPH, CHF, hypertension, hypothyroidism, MDS, CAD, and more presents the ED with a chief complaint of generalized weakness.  Patient reports that he has had multiple falls, has not been able to get up off the ground, and has had generalized weakness.  Patient is currently on chemo.  He gets chemo for 7 days and then gets 28 days off.  His last chemo treatment was 2 weeks ago.  Patient has developed diarrhea since having chemo per chart review.  Patient reports that he has developed of diarrhea since being admitted last.  He reports he is having diarrhea about 10 times per day.  It is nonbloody.  It is causing him to have perianal soreness and tenderness.  He reports stomach cramping pain.  The pain is intermittent.  Usually occurs directly before bowel movement.  He does not always have control over the bowel movements and has to wear depends.  Has had no p.o. intake for 4 days but continues to have diarrhea.  He reports he only drinks a little bit of water with his meds in the morning.  He denies any fevers.  He has taken Imodium but he does not think it has helped with his diarrhea.  Patient reports he has had generalized weakness.  He used to be able to ambulate, independently go to the grocery store, and function as "a normal person."  Since having chemo he has been "a total wreck."  He reports he cannot walk, he bruised from falling.  He has right foot pain.  Patient is most concerned about this right foot pain.  He reports that it is painful all the time.  It is worse when he bears weight.  Is swollen compared to the left foot.  He describes the pain as sharp.  He reports that this has been going on for several  days.  He does not associate it with any of his falls.  Patient has no other complaints at this time.   Patient reports he quit smoking 2 weeks ago.  He does not drink alcohol.  He is vaccinated for COVID.   Patient does have ACP documents which specifically say he would not want his life to be prolonged if he had a condition that cannot be cured, if he becomes unconscious and his doctors determine that he will never regain consciousness, or if he suffers from advanced dementia.  He does not have a DNR on file.   Assessment and Plan: Dehydration - TREATED and RESOLVED  - Secondary to GI losses - 2 L in the ED - improved with IV hydration - Encourage p.o. fluid intake - creatinine trending down with treatment (now down to 1.49)   Chronic anemia / thrombocytopenia - Hemoglobin stable at 7.5 after 1 unit PRBC given 7/3 - Continue folic acid - Hg improved to 7.5 after transfusion    Right foot pain - improved - Patient's main concern is his right foot pain - Minimal swelling - He has had multiple falls but does not think that he is specifically hurt the foot during the falls - X-ray right foot - degenerative changes noted  - Due to the swelling and the fact the patient is in cancer  treatment, we will get a ultrasound DVT right lower extremity as well -- NEG FOR DVT  - Pain control with pain scale - check uric acid - 4.5 within normal limits   Atrial fibrillation, chronic  - Continue amiodarone and Coreg - Continue aspirin  Prolonged QT interval - History of prolonged QTc  Diarrhea A/W NOROVIRUS - Last discharged on June 20 and patient reports he has had diarrhea since then - He did receive antibiotics during that hospitalization for UTI - Today C. difficile is negative - GI panel POSITIVE FOR NOROVIRUS - Imodium for symptom control - Full liquid diet--advance diet as tolerated to soft foods  - improved with supportive measures   MDS (myelodysplastic syndrome)  - Dr. Kirtland Bouchard  consulted from the ED recommended admission for hydration - Patient gets 7 days of chemo and then 28 days off - His last chemo was 2 weeks ago - pt does not want to do more chemo at this time - Hg 7.3-7.5   AKI (acute kidney injury)  - Creatinine increased to 2.29 from 1.89 - Secondary to dehydration secondary to GI losses - Continue IV hydration - reduce rate to 60 cc/hr - Hold nephrotoxic agents when possible - creatinine down to 1.49  Essential hypertension - Continue Coreg  MASD - barrier creme applied and skin care    DVT prophylaxis: [place] heparin  Code Status: DNR  Family Communication:  Disposition: working on SNF placement, outpatient palliative care referral    Consultants:  Palliative  Procedures:   Antimicrobials:     Subjective: Pt had a fever this morning, had worries about SNF   Objective: Vitals:   11/25/22 1729 11/25/22 2027 11/26/22 0627 11/26/22 0924  BP: (!) 120/55 114/64 120/70 125/65  Pulse: 63 65 (!) 58 60  Resp: 20 16 16    Temp: (!) 97.1 F (36.2 C) 97.6 F (36.4 C) 97.8 F (36.6 C)   TempSrc:  Oral Oral   SpO2: 99% 94% 92%   Weight:      Height:        Intake/Output Summary (Last 24 hours) at 11/26/2022 1326 Last data filed at 11/26/2022 4782 Gross per 24 hour  Intake 360 ml  Output 450 ml  Net -90 ml   Filed Weights   11/20/22 1754  Weight: 81.6 kg   Examination:  General exam: chronically ill appearing male, Appears calm and comfortable  Respiratory system: Clear to auscultation. Respiratory effort normal. Cardiovascular system: normal S1 & S2 heard. No JVD, murmurs, rubs, gallops or clicks. No pedal edema. Gastrointestinal system: Abdomen is nondistended, soft and nontender. No organomegaly or masses felt. Normal bowel sounds heard. Central nervous system: Alert and oriented. No focal neurological deficits. Extremities: Symmetric 5 x 5 power. Skin: No rashes, lesions or ulcers. Psychiatry: Judgement and insight appear normal.  Mood & affect appropriate.   Data Reviewed: I have personally reviewed following labs and imaging studies  CBC: Recent Labs  Lab 11/20/22 1936 11/21/22 0417 11/22/22 0424 11/23/22 0415 11/25/22 0446  WBC 4.3 4.5 3.6* 3.7* 3.1*  NEUTROABS 3.1 2.8  --   --   --   HGB 7.8* 6.6* 6.8* 7.5* 7.3*  HCT 24.0* 21.1* 21.1* 23.5* 23.5*  MCV 103.9* 106.6* 102.4* 102.2* 102.6*  PLT 210 165 132* 107* 93*    Basic Metabolic Panel: Recent Labs  Lab 11/20/22 1936 11/21/22 0417 11/22/22 0424 11/23/22 0415  NA 137 138 137 137  K 3.6 3.3* 3.5 3.4*  CL 109 110 114* 114*  CO2 18* 18* 17* 18*  GLUCOSE 103* 99 101* 109*  BUN 47* 43* 40* 37*  CREATININE 2.29* 2.00* 1.65* 1.49*  CALCIUM 8.4* 7.8* 7.7* 7.8*  MG  --  2.2  --  2.0    CBG: No results for input(s): "GLUCAP" in the last 168 hours.  Recent Results (from the past 240 hour(s))  Gastrointestinal Panel by PCR , Stool     Status: Abnormal   Collection Time: 11/20/22  9:46 PM   Specimen: Stool  Result Value Ref Range Status   Campylobacter species NOT DETECTED NOT DETECTED Final   Plesimonas shigelloides NOT DETECTED NOT DETECTED Final   Salmonella species NOT DETECTED NOT DETECTED Final   Yersinia enterocolitica NOT DETECTED NOT DETECTED Final   Vibrio species NOT DETECTED NOT DETECTED Final   Vibrio cholerae NOT DETECTED NOT DETECTED Final   Enteroaggregative E coli (EAEC) NOT DETECTED NOT DETECTED Final   Enteropathogenic E coli (EPEC) NOT DETECTED NOT DETECTED Final   Enterotoxigenic E coli (ETEC) NOT DETECTED NOT DETECTED Final   Shiga like toxin producing E coli (STEC) NOT DETECTED NOT DETECTED Final   Shigella/Enteroinvasive E coli (EIEC) NOT DETECTED NOT DETECTED Final   Cryptosporidium NOT DETECTED NOT DETECTED Final   Cyclospora cayetanensis NOT DETECTED NOT DETECTED Final   Entamoeba histolytica NOT DETECTED NOT DETECTED Final   Giardia lamblia NOT DETECTED NOT DETECTED Final   Adenovirus F40/41 NOT DETECTED NOT  DETECTED Final   Astrovirus NOT DETECTED NOT DETECTED Final   Norovirus GI/GII DETECTED (A) NOT DETECTED Final    Comment: RESULT CALLED TO, READ BACK BY AND VERIFIED WITH: VAL DILDY 11/21/22 1516 KLW    Rotavirus A NOT DETECTED NOT DETECTED Final   Sapovirus (I, II, IV, and V) NOT DETECTED NOT DETECTED Final    Comment: Performed at [facility], [address] [place], [place] [address]  C Difficile Quick Screen w PCR reflex     Status: None   Collection Time: 11/20/22  9:46 PM   Specimen: Stool  Result Value Ref Range Status   C Diff antigen NEGATIVE NEGATIVE Final   C Diff toxin NEGATIVE NEGATIVE Final   C Diff interpretation No C. difficile detected.  Final    Comment: Performed at [facility], [address] [place], [place] [address]  Culture, blood (single) w Reflex to ID Panel     Status: None (Preliminary result)   Collection Time: 11/25/22  8:57 AM   Specimen: BLOOD LEFT HAND  Result Value Ref Range Status   Specimen Description BLOOD LEFT HAND  Final   Special Requests   Final    BOTTLES DRAWN AEROBIC AND ANAEROBIC Blood Culture adequate volume   Culture   Final    NO GROWTH < 24 HOURS Performed at [facility], [address] [place], [place] [address]    Report Status PENDING  Incomplete     Radiology Studies: DG CHEST PORT 1 VIEW  Result Date: 11/25/2022 CLINICAL DATA:  Fever EXAM: PORTABLE CHEST 1 VIEW COMPARISON:  11/06/2022 FINDINGS: Right-sided chest port remains in place. Stable mild cardiomegaly. Aortic atherosclerosis. Coarsened interstitial markings bilaterally. No lobar consolidation. No pleural effusion or pneumothorax. IMPRESSION: Coarsened interstitial markings bilaterally, which may reflect bronchitic type lung changes. No lobar consolidation. Electronically Signed   By: Duanne Guess D.O.   On: 11/25/2022 12:47    Scheduled Meds:  (feeding supplement) PROSource Plus  60 mL Oral TID BM   ALPRAZolam  1 mg Oral QHS  amiodarone  200 mg Oral  BID   aspirin  81 mg Oral Q1400   carvedilol  3.125 mg Oral BID WC   feeding supplement  237 mL Oral BID BM   folic acid  1 mg Oral Q1400   heparin  5,000 Units Subcutaneous Q8H   levothyroxine  25 mcg Oral Q1400   tamsulosin  0.4 mg Oral Q1400   Continuous Infusions:  sodium chloride 60 mL/hr at 11/25/22 1724    LOS: 5 days   Time spent: 35 mins  Oliviya Gilkison Laural Benes, MD How to contact the [facility] Attending or Consulting provider 7A - 7P or covering provider during after hours 7P -7A, for this patient?  Check the care team in [facility] and look for a) attending/consulting [facility] provider listed and b) the [facility] team listed Log into [email or web address] and use [place]'s universal password to access. If you do not have the password, please contact the hospital operator. Locate the [facility] provider you are looking for under [facility] and page to a number that you can be directly reached. If you still have difficulty reaching the provider, please page the [facility] (Director on Call) for the Hospitalists listed on amion for assistance.  11/26/2022, 1:26 PM

## 2022-11-26 NOTE — Progress Notes (Signed)
Physical Therapy Treatment Patient Details Name: ANVAY ARAKELIAN MRN: [id number] DOB: January 30, 1951 Today's Date: 11/26/2022   History of Present Illness NORMAND DOLL is a 72 y.o. male with medical history significant of AAA, atrial fibrillation, anxiety, BPH, CHF, hypertension, hypothyroidism, MDS, CAD, and more presents the ED with a chief complaint of generalized weakness.  Patient reports that he has had multiple falls, has not been able to get up off the ground, and has had generalized weakness.  Patient is currently on chemo.  He gets chemo for 7 days and then gets 28 days off.  His last chemo treatment was 2 weeks ago.  Patient has developed diarrhea since having chemo per chart review.  Patient reports that he has developed of diarrhea since being admitted last.  He reports he is having diarrhea about 10 times per day.  It is nonbloody.  It is causing him to have perianal soreness and tenderness.  He reports stomach cramping pain.  The pain is intermittent.  Usually occurs directly before bowel movement.  He does not always have control over the bowel movements and has to wear depends.  Has had no p.o. intake for 4 days but continues to have diarrhea.  He reports he only drinks a little bit of water with his meds in the morning.  He denies any fevers.  He has taken Imodium but he does not think it has helped with his diarrhea.  Patient reports he has had generalized weakness.  He used to be able to ambulate, independently go to the grocery store, and function as "a normal person."  Since having chemo he has been "a total wreck."  He reports he cannot walk, he bruised from falling.  He has right foot pain.  Patient is most concerned about this right foot pain.  He reports that it is painful all the time.  It is worse when he bears weight.  Is swollen compared to the left foot.  He describes the pain as sharp.  He reports that this has been going on for several days.  He does not associate it with any of his  falls.  Patient has no other complaints at this time.    PT Comments  Patient demonstrates slow labored movement for sitting up at bedside with poor tolerance for moving RLE due to c/o severe pain with any movement, once seated had difficulty completing exercises with LLE requiring active assistance and unable to fully stand due to weakness and increasing right foot pain.  Patient put back to bed and left in side lying position - nursing staff notified.  Patient will benefit from continued skilled physical therapy in hospital and recommended venue below to increase strength, balance, endurance for safe ADLs and gait.       Assistance Recommended at Discharge Set up Supervision/Assistance  If plan is discharge home, recommend the following:  Can travel by private vehicle    A lot of help with bathing/dressing/bathroom;A lot of help with walking and/or transfers;Help with stairs or ramp for entrance;Assistance with cooking/housework   No  Equipment Recommendations  Rolling walker (2 wheels)    Recommendations for Other Services       Precautions / Restrictions Precautions Precautions: Fall Restrictions Weight Bearing Restrictions: No RLE Weight Bearing: Weight bearing as tolerated     Mobility  Bed Mobility Overal bed mobility: Needs Assistance Bed Mobility: Sit to Sidelying, Supine to Sit     Supine to sit: Mod assist Sit to supine: Mod assist  General bed mobility comments: slow labored movement with poor tolerance for moving RLE due to increased foot/lower leg pain    Transfers Overall transfer level: Needs assistance Equipment used: Rolling walker (2 wheels) Transfers: Sit to/from Stand Sit to Stand: Max assist           General transfer comment: Max assist to partially lift bottom off bed due to weakness and c/o severe right foot pain    Ambulation/Gait                   Stairs             Wheelchair Mobility     Tilt Bed    Modified  Rankin (Stroke Patients Only)       Balance Overall balance assessment: Needs assistance Sitting-balance support: Feet supported, No upper extremity supported Sitting balance-Leahy Scale: Fair Sitting balance - Comments: seated at EOB                                    Cognition Arousal/Alertness: Awake/alert Behavior During Therapy: WFL for tasks assessed/performed Overall Cognitive Status: Within Functional Limits for tasks assessed                                          Exercises General Exercises - Lower Extremity Long Arc Quad: Seated, AAROM, Strengthening, Both, 10 reps Hip Flexion/Marching: Seated, AAROM, Strengthening, Both, 10 reps Toe Raises: Seated, AROM, Strengthening, Left, 10 reps Heel Raises: Seated, AROM, Strengthening, Left, 10 reps    General Comments        Pertinent Vitals/Pain Pain Assessment Pain Assessment: Faces Faces Pain Scale: Hurts even more Pain Location: right foot with pressure or movement Pain Descriptors / Indicators: Grimacing, Guarding, Sharp Pain Intervention(s): Limited activity within patient's tolerance, Monitored during session, Repositioned    Home Living                          Prior Function            PT Goals (current goals can now be found in the care plan section) Acute Rehab PT Goals Patient Stated Goal: return home with family to assist PT Goal Formulation: With family Time For Goal Achievement: 12/05/22 Potential to Achieve Goals: Good Progress towards PT goals: Progressing toward goals    Frequency    Min 3X/week      PT Plan      Co-evaluation              AM-PAC PT "6 Clicks" Mobility   Outcome Measure  Help needed turning from your back to your side while in a flat bed without using bedrails?: A Little Help needed moving from lying on your back to sitting on the side of a flat bed without using bedrails?: A Lot Help needed moving to and from a  bed to a chair (including a wheelchair)?: A Lot Help needed standing up from a chair using your arms (e.g., wheelchair or bedside chair)?: A Lot Help needed to walk in hospital room?: A Lot Help needed climbing 3-5 steps with a railing? : Total 6 Click Score: 12    End of Session   Activity Tolerance: Patient tolerated treatment well;Patient limited by fatigue;Patient limited by pain Patient left: in bed;with call  bell/phone within reach Nurse Communication: Mobility status PT Visit Diagnosis: Unsteadiness on feet (R26.81);Other abnormalities of gait and mobility (R26.89);Muscle weakness (generalized) (M62.81)     Time: 1191-4782 PT Time Calculation (min) (ACUTE ONLY): 30 min  Charges:    $Therapeutic Exercise: 8-22 mins $Therapeutic Activity: 8-22 mins PT General Charges $$ ACUTE PT VISIT: 1 Visit                     12:12 PM, 11/26/22 Ocie Bob, MPT Physical Therapist with [facility] [phone] office [phone] mobile phone

## 2022-11-27 ENCOUNTER — Inpatient Hospital Stay: Payer: PPO

## 2022-11-27 ENCOUNTER — Inpatient Hospital Stay: Payer: PPO | Admitting: Hematology

## 2022-11-27 DIAGNOSIS — Z515 Encounter for palliative care: Secondary | ICD-10-CM | POA: Diagnosis not present

## 2022-11-27 DIAGNOSIS — Z7189 Other specified counseling: Secondary | ICD-10-CM | POA: Diagnosis not present

## 2022-11-27 DIAGNOSIS — D469 Myelodysplastic syndrome, unspecified: Secondary | ICD-10-CM | POA: Diagnosis not present

## 2022-11-27 LAB — CBC
HCT: 23.1 % — ABNORMAL LOW (ref 39.0–52.0)
Hemoglobin: 7.3 g/dL — ABNORMAL LOW (ref 13.0–17.0)
MCH: 32.4 pg (ref 26.0–34.0)
MCHC: 31.6 g/dL (ref 30.0–36.0)
MCV: 102.7 fL — ABNORMAL HIGH (ref 80.0–100.0)
Platelets: 105 10*3/uL — ABNORMAL LOW (ref 150–400)
RBC: 2.25 MIL/uL — ABNORMAL LOW (ref 4.22–5.81)
RDW: 18.4 % — ABNORMAL HIGH (ref 11.5–15.5)
WBC: 2.6 10*3/uL — ABNORMAL LOW (ref 4.0–10.5)
nRBC: 0 % (ref 0.0–0.2)

## 2022-11-27 NOTE — Progress Notes (Signed)
   11/22/22 1526  Spiritual Encounters  Type of Visit Initial  Care provided to: Patient  Conversation partners present during encounter Nurse  Referral source Patient request  Reason for visit End-of-life  OnCall Visit No  Spiritual Framework  Presenting Themes Meaning/purpose/sources of inspiration;Goals in life/care;Impactful experiences and emotions;Values and beliefs;Courage hope and growth;Significant life change  Community/Connection Family  Patient Stress Factors Health changes;Major life changes  Family Stress Factors None identified  Interventions  Spiritual Care Interventions Made Established relationship of care and support;Compassionate presence;Reflective listening;Normalization of emotions;Narrative/life review;Bereavement/grief support;Decision-making support/facilitation;Prayer;Supported grief process  Intervention Outcomes  Outcomes Connection to spiritual care;Awareness of support;Reduced anxiety;Reduced fear;Autonomy/agency  Spiritual Care Plan  Spiritual Care Issues Still Outstanding John Hicks will continue to follow   Referred to patient through Palliative Care. Found John Hicks sitting upright in hospital bed, awake. He welcomed this Insurance claims handler. Chaplain engaged him in reflection around his upbringing, spiritual heritage, relationships, and illness story today. He understands how serious his illness is and shared that he really does not have many people that could or would assist him in this time of his life. He is hesitant to go to a skilled facility; he also understands that his cancer is not going to get better in any curative way with chemotherapy.  He engaged well today sharing a little about his work and relationship with a friend of his. Chaplain provided opportunity for reflection and assisted in facilitating his decision making today by creating a safe space for him to reflect. Provided prayer before closing visit and will continue to remain available in  order to provide spiritual support and to assess for spiritual need.   Rev. Jolyn Lent, M.Div Chaplain

## 2022-11-27 NOTE — Progress Notes (Signed)
11/27/2022 4:22 PM  DC delayed as I received notice that there was a water leak at facility and they cannot take him today but should be able to take him tomorrow.   Maryln Manuel MD

## 2022-11-27 NOTE — Progress Notes (Signed)
   11/27/22 1540  Spiritual Encounters  Type of Visit Follow up  Care provided to: Patient  Conversation partners present during encounter Nurse  Referral source Patient request  Reason for visit Grief/loss  OnCall Visit No  Spiritual Framework  Presenting Themes Meaning/purpose/sources of inspiration;Goals in life/care  Community/Connection Family  Patient Stress Factors Loss;Major life changes  Family Stress Factors None identified  Interventions  Spiritual Care Interventions Made Compassionate presence;Reflective listening;Prayer  Intervention Outcomes  Outcomes Connection to spiritual care;Reduced anxiety;Reduced fear  Spiritual Care Plan  Spiritual Care Issues Still Outstanding Chaplain will continue to follow   Found Mr. Gravlin sitting upright in his hospital with eyes closed. He opened eyes and welcomed Chaplain bedside. He stated that he was being discharged today and hopefully will be going to a nearby skilled facility. He expressed some anxiety about this but understands that he is unable to care for himself at this time. He asked for prayer today and Chaplain provided this and compassionate presence. He thanked Orthoptist for conversation last Friday and Chaplain will remain available in order to provide spiritual support and to assess for spiritual need.   Rev. Jolyn Lent, M. Div Chaplain

## 2022-11-27 NOTE — TOC Progression Note (Signed)
Transition of Care [facility]) - Progression Note    Patient Details  Name: John Hicks MRN: [id number] Date of Birth: 1951/02/05  [facility] CM/SW Contact  Leitha Bleak, RN Phone Number: 11/27/2022, 9:25 AM  Clinical Narrative:   [facility] has been approved for 7 days. start date is when patients admits to facility. [facility] is active for 5 business days. ref # [id number]. Call [phone]( [place] with [facility]) with any questions.   Discussed bed offer with [facility], they want [facility]. CMA added to Auth.    Expected Discharge Plan: Skilled Nursing Facility Barriers to Discharge: Insurance Authorization  Expected Discharge Plan and Services       Living arrangements for the past 2 months: Single Family Home                   Social Determinants of Health (SDOH) Interventions SDOH Screenings   Food Insecurity: No Food Insecurity (11/20/2022)  Housing: Low Risk  (11/20/2022)  Transportation Needs: No Transportation Needs (11/20/2022)  Utilities: Not At Risk (11/20/2022)  Depression (PHQ2-9): Low Risk  (06/30/2022)  Tobacco Use: High Risk (11/20/2022)    Readmission Risk Interventions     No data to display

## 2022-11-27 NOTE — Progress Notes (Signed)
Palliative: Chart review completed.  Discussion with transition of care team.  At this point the goal is discharged to short-term rehab at [facility] today.   No further palliative needs identified at this time.  No charge Lillia Carmel, NP Palliative medicine team Team phone [phone] Greater than 50% of this time was spent counseling and coordinating care related to the above assessment and plan

## 2022-11-27 NOTE — Progress Notes (Addendum)
PROGRESS NOTE   John Hicks  UJW:[id number] DOB: 27-Jan-1951 DOA: 11/20/2022 PCP: Elfredia Nevins, MD   Chief Complaint  Patient presents with   Diarrhea   Level of care: Med-Surg  Brief Admission History:  72 y.o. male with medical history significant of AAA, atrial fibrillation, anxiety, BPH, CHF, hypertension, hypothyroidism, MDS, CAD, and more presents the ED with a chief complaint of generalized weakness.  Patient reports that he has had multiple falls, has not been able to get up off the ground, and has had generalized weakness.  Patient is currently on chemo.  He gets chemo for 7 days and then gets 28 days off.  His last chemo treatment was 2 weeks ago.  Patient has developed diarrhea since having chemo per chart review.  Patient reports that he has developed of diarrhea since being admitted last.  He reports he is having diarrhea about 10 times per day.  It is nonbloody.  It is causing him to have perianal soreness and tenderness.  He reports stomach cramping pain.  The pain is intermittent.  Usually occurs directly before bowel movement.  He does not always have control over the bowel movements and has to wear depends.  Has had no p.o. intake for 4 days but continues to have diarrhea.  He reports he only drinks a little bit of water with his meds in the morning.  He denies any fevers.  He has taken Imodium but he does not think it has helped with his diarrhea.  Patient reports he has had generalized weakness.  He used to be able to ambulate, independently go to the grocery store, and function as "a normal person."  Since having chemo he has been "a total wreck."  He reports he cannot walk, he bruised from falling.  He has right foot pain.  Patient is most concerned about this right foot pain.  He reports that it is painful all the time.  It is worse when he bears weight.  Is swollen compared to the left foot.  He describes the pain as sharp.  He reports that this has been going on for several  days.  He does not associate it with any of his falls.  Patient has no other complaints at this time.   Patient reports he quit smoking 2 weeks ago.  He does not drink alcohol.  He is vaccinated for COVID.   Patient does have ACP documents which specifically say he would not want his life to be prolonged if he had a condition that cannot be cured, if he becomes unconscious and his doctors determine that he will never regain consciousness, or if he suffers from advanced dementia.  He does not have a DNR on file.   Assessment and Plan: Dehydration - TREATED and RESOLVED  - Secondary to GI losses - 2 L in the ED - improved with IV hydration - Encourage p.o. fluid intake - creatinine trending down with treatment (now down to 1.49)   Chronic anemia / thrombocytopenia - Hemoglobin stable at 7.5 after 1 unit PRBC given 7/3 - Continue folic acid - Hg improved to 7.5 after transfusion    Right foot pain - improved - Patient's main concern is his right foot pain - Minimal swelling - He has had multiple falls but does not think that he is specifically hurt the foot during the falls - X-ray right foot - degenerative changes noted  - Due to the swelling and the fact the patient is in cancer  treatment, we will get a ultrasound DVT right lower extremity as well -- NEG FOR DVT  - Pain control with pain scale - check uric acid - 4.5 within normal limits   Chronic Perineal Wounds - continue wet to dry saline packing dressing and keep area clean  - wound care nursing to follow at SNF recommended  Atrial fibrillation, chronic  - Continue amiodarone and Coreg - Continue aspirin  Prolonged QT interval - History of prolonged QTc  Diarrhea A/W NOROVIRUS - Last discharged on June 20 and patient reports he has had diarrhea since then - He did receive antibiotics during that hospitalization for UTI - Today C. difficile is negative - GI panel POSITIVE FOR NOROVIRUS - Imodium for symptom control -  Full liquid diet--advance diet as tolerated to soft foods  - improved with supportive measures   MDS (myelodysplastic syndrome)  - Dr. Kirtland Bouchard consulted from the ED recommended admission for hydration - Patient gets 7 days of chemo and then 28 days off - His last chemo was 2 weeks ago - pt does not want to do more chemo at this time - Hg 7.3-7.5   AKI (acute kidney injury)  - Creatinine increased to 2.29 from 1.89 - Secondary to dehydration secondary to GI losses - Continue IV hydration - reduce rate to 60 cc/hr - Hold nephrotoxic agents when possible - creatinine down to 1.49  Essential hypertension - Continue Coreg  MASD - barrier creme applied and skin care    DVT prophylaxis: [place] heparin  Code Status: DNR  Family Communication:  Disposition: working on SNF placement, outpatient palliative care referral    Consultants:  Palliative  Procedures:   Antimicrobials:     Subjective: Pt had a fever this morning, had worries about SNF   Objective: Vitals:   11/26/22 2042 11/27/22 0523 11/27/22 0905 11/27/22 1414  BP: 134/73 125/63 132/68 127/74  Pulse: 61 (!) 56 66 68  Resp: 18 16  15   Temp: 98.9 F (37.2 C)   98.4 F (36.9 C)  TempSrc:    Oral  SpO2: 94% 95%  96%  Weight:      Height:        Intake/Output Summary (Last 24 hours) at 11/27/2022 1623 Last data filed at 11/27/2022 1610 Gross per 24 hour  Intake 480 ml  Output 600 ml  Net -120 ml   Filed Weights   11/20/22 1754  Weight: 81.6 kg   Examination:  General exam: chronically ill appearing male, Appears calm and comfortable  Respiratory system: Clear to auscultation. Respiratory effort normal. Cardiovascular system: normal S1 & S2 heard. No JVD, murmurs, rubs, gallops or clicks. No pedal edema. Gastrointestinal system: Abdomen is nondistended, soft and nontender. No organomegaly or masses felt. Normal bowel sounds heard. Central nervous system: Alert and oriented. No focal neurological  deficits. Extremities: Symmetric 5 x 5 power. Skin: perineal ulceration chronic wound   Psychiatry: Judgement and insight appear normal. Mood & affect appropriate.   Data Reviewed: I have personally reviewed following labs and imaging studies  CBC: Recent Labs  Lab 11/20/22 1936 11/21/22 0417 11/22/22 0424 11/23/22 0415 11/25/22 0446 11/27/22 0422  WBC 4.3 4.5 3.6* 3.7* 3.1* 2.6*  NEUTROABS 3.1 2.8  --   --   --   --   HGB 7.8* 6.6* 6.8* 7.5* 7.3* 7.3*  HCT 24.0* 21.1* 21.1* 23.5* 23.5* 23.1*  MCV 103.9* 106.6* 102.4* 102.2* 102.6* 102.7*  PLT 210 165 132* 107* 93* 105*    Basic  Metabolic Panel: Recent Labs  Lab 11/20/22 1936 11/21/22 0417 11/22/22 0424 11/23/22 0415  NA 137 138 137 137  K 3.6 3.3* 3.5 3.4*  CL 109 110 114* 114*  CO2 18* 18* 17* 18*  GLUCOSE 103* 99 101* 109*  BUN 47* 43* 40* 37*  CREATININE 2.29* 2.00* 1.65* 1.49*  CALCIUM 8.4* 7.8* 7.7* 7.8*  MG  --  2.2  --  2.0    CBG: No results for input(s): "GLUCAP" in the last 168 hours.  Recent Results (from the past 240 hour(s))  Gastrointestinal Panel by PCR , Stool     Status: Abnormal   Collection Time: 11/20/22  9:46 PM   Specimen: Stool  Result Value Ref Range Status   Campylobacter species NOT DETECTED NOT DETECTED Final   Plesimonas shigelloides NOT DETECTED NOT DETECTED Final   Salmonella species NOT DETECTED NOT DETECTED Final   Yersinia enterocolitica NOT DETECTED NOT DETECTED Final   Vibrio species NOT DETECTED NOT DETECTED Final   Vibrio cholerae NOT DETECTED NOT DETECTED Final   Enteroaggregative E coli (EAEC) NOT DETECTED NOT DETECTED Final   Enteropathogenic E coli (EPEC) NOT DETECTED NOT DETECTED Final   Enterotoxigenic E coli (ETEC) NOT DETECTED NOT DETECTED Final   Shiga like toxin producing E coli (STEC) NOT DETECTED NOT DETECTED Final   Shigella/Enteroinvasive E coli (EIEC) NOT DETECTED NOT DETECTED Final   Cryptosporidium NOT DETECTED NOT DETECTED Final   Cyclospora  cayetanensis NOT DETECTED NOT DETECTED Final   Entamoeba histolytica NOT DETECTED NOT DETECTED Final   Giardia lamblia NOT DETECTED NOT DETECTED Final   Adenovirus F40/41 NOT DETECTED NOT DETECTED Final   Astrovirus NOT DETECTED NOT DETECTED Final   Norovirus GI/GII DETECTED (A) NOT DETECTED Final    Comment: RESULT CALLED TO, READ BACK BY AND VERIFIED WITH: VAL DILDY 11/21/22 1516 KLW    Rotavirus A NOT DETECTED NOT DETECTED Final   Sapovirus (I, II, IV, and V) NOT DETECTED NOT DETECTED Final    Comment: Performed at [facility], [address] [place], [place] [address]  C Difficile Quick Screen w PCR reflex     Status: None   Collection Time: 11/20/22  9:46 PM   Specimen: Stool  Result Value Ref Range Status   C Diff antigen NEGATIVE NEGATIVE Final   C Diff toxin NEGATIVE NEGATIVE Final   C Diff interpretation No C. difficile detected.  Final    Comment: Performed at [facility], [address] [place], [place] [address]  Culture, blood (single) w Reflex to ID Panel     Status: None (Preliminary result)   Collection Time: 11/25/22  8:57 AM   Specimen: BLOOD LEFT HAND  Result Value Ref Range Status   Specimen Description BLOOD LEFT HAND  Final   Special Requests   Final    BOTTLES DRAWN AEROBIC AND ANAEROBIC Blood Culture adequate volume   Culture   Final    NO GROWTH 2 DAYS Performed at [facility], [address] [place], [place] [address]    Report Status PENDING  Incomplete     Radiology Studies: No results found.  Scheduled Meds:  (feeding supplement) PROSource Plus  60 mL Oral TID BM   ALPRAZolam  1 mg Oral QHS   amiodarone  200 mg Oral BID   aspirin  81 mg Oral Q1400   carvedilol  3.125 mg Oral BID WC   feeding supplement  237 mL Oral BID BM   folic acid  1 mg  Oral Q1400   heparin  5,000 Units Subcutaneous Q8H   levothyroxine  25 mcg Oral Q1400   tamsulosin  0.4 mg Oral Q1400   Continuous Infusions:    LOS: 6 days   Time spent: 35  mins  Hayk Divis Laural Benes, MD How to contact the [facility] Attending or Consulting provider 7A - 7P or covering provider during after hours 7P -7A, for this patient?  Check the care team in [facility] and look for a) attending/consulting TRH provider listed and b) the [facility] team listed Log into [email or web address] and use [employer]'s universal password to access. If you do not have the password, please contact the hospital operator. Locate the [facility] provider you are looking for under [facility] and page to a number that you can be directly reached. If you still have difficulty reaching the provider, please page the [facility] (Director on Call) for the Hospitalists listed on amion for assistance.  11/27/2022, 4:23 PM

## 2022-11-27 NOTE — Consult Note (Signed)
WOC Nurse Consult Note: Discussed area of concern at scrotum with Bedside RN, [place]. Dr. Laural Benes has assessed patient today and no wound care orders are required, no consultation request is needed.  I will discontinue consult.  WOC nursing team will not follow, but will remain available to this patient, the nursing and medical teams.  Please re-consult if needed.  Thank you for inviting [place] to participate in this patient's Plan of Care.  Ladona Mow, MSN, RN, CNS, GNP, Leda Min, [employer], [employer] phone:  [phone]

## 2022-11-28 ENCOUNTER — Ambulatory Visit (INDEPENDENT_AMBULATORY_CARE_PROVIDER_SITE_OTHER): Payer: PPO | Admitting: Gastroenterology

## 2022-11-28 ENCOUNTER — Encounter (INDEPENDENT_AMBULATORY_CARE_PROVIDER_SITE_OTHER): Payer: Self-pay | Admitting: Gastroenterology

## 2022-11-28 ENCOUNTER — Inpatient Hospital Stay: Payer: PPO

## 2022-11-28 DIAGNOSIS — E860 Dehydration: Secondary | ICD-10-CM | POA: Diagnosis not present

## 2022-11-28 DIAGNOSIS — I10 Essential (primary) hypertension: Secondary | ICD-10-CM

## 2022-11-28 DIAGNOSIS — D469 Myelodysplastic syndrome, unspecified: Secondary | ICD-10-CM | POA: Diagnosis not present

## 2022-11-28 DIAGNOSIS — M79671 Pain in right foot: Secondary | ICD-10-CM

## 2022-11-28 DIAGNOSIS — I482 Chronic atrial fibrillation, unspecified: Secondary | ICD-10-CM | POA: Diagnosis not present

## 2022-11-28 DIAGNOSIS — N179 Acute kidney failure, unspecified: Secondary | ICD-10-CM | POA: Diagnosis not present

## 2022-11-28 MED ORDER — ONDANSETRON HCL 4 MG PO TABS: 4.0000 mg | ORAL_TABLET | Freq: Three times a day (TID) | ORAL | Status: AC | PRN

## 2022-11-28 MED ORDER — COLCHICINE 0.6 MG PO TABS
1.2000 mg | ORAL_TABLET | Freq: Once | ORAL | Status: AC
  Administered 2022-11-28: 1.2 mg via ORAL
  Filled 2022-11-28: qty 2

## 2022-11-28 MED ORDER — METHYLPREDNISOLONE SODIUM SUCC 40 MG IJ SOLR
40.0000 mg | Freq: Once | INTRAMUSCULAR | Status: AC
  Administered 2022-11-28: 40 mg via INTRAVENOUS
  Filled 2022-11-28: qty 1

## 2022-11-28 MED ORDER — ENSURE ENLIVE PO LIQD: 237.0000 mL | Freq: Two times a day (BID) | ORAL | 12 refills | Status: DC

## 2022-11-28 MED ORDER — ALPRAZOLAM 0.5 MG PO TABS: 0.5000 mg | ORAL_TABLET | Freq: Two times a day (BID) | ORAL | 0 refills | Status: DC | PRN

## 2022-11-28 MED ORDER — ACETAMINOPHEN 325 MG PO TABS: 650.0000 mg | ORAL_TABLET | Freq: Four times a day (QID) | ORAL | Status: DC | PRN

## 2022-11-28 MED ORDER — CARVEDILOL 3.125 MG PO TABS: 3.1250 mg | ORAL_TABLET | Freq: Two times a day (BID) | ORAL | Status: DC

## 2022-11-28 MED ORDER — OXYCODONE HCL 5 MG PO TABS: 5.0000 mg | ORAL_TABLET | Freq: Four times a day (QID) | ORAL | 0 refills | Status: AC | PRN

## 2022-11-28 MED ORDER — LOPERAMIDE HCL 2 MG PO CAPS: 2.0000 mg | ORAL_CAPSULE | Freq: Four times a day (QID) | ORAL | 0 refills | Status: DC | PRN

## 2022-11-28 MED ORDER — PROSOURCE PLUS PO LIQD: 60.0000 mL | Freq: Three times a day (TID) | ORAL | Status: DC

## 2022-11-28 NOTE — TOC Transition Note (Signed)
Transition of Care [facility]) - CM/SW Discharge Note   Patient Details  Name: John Hicks MRN: [id number] Date of Birth: 1950/06/17  [facility] CM/SW Contact:  Leitha Bleak, RN Phone Number: 11/28/2022, 11:42 AM   Clinical Narrative:   Patient discharging to [facility]. CM confirmed bed is ready today, updated Ricky with discharge plan. Rn will Call report. TOC will schedule EMS when patient is ready.  Family updated.  Final next level of care: Skilled Nursing Facility Barriers to Discharge: Barriers Resolved   Patient Goals and CMS Choice CMS Medicare.gov Compare Post Acute Care list provided to:: Patient Choice offered to / list presented to : Patient  Discharge Placement                  Patient to be transferred to facility by: EMS Name of family member notified: Ricky Patient and family notified of of transfer: 11/28/22  Discharge Plan and Services Additional resources added to the After Visit Summary for          Social Determinants of Health (SDOH) Interventions SDOH Screenings   Food Insecurity: No Food Insecurity (11/20/2022)  Housing: Low Risk  (11/20/2022)  Transportation Needs: No Transportation Needs (11/20/2022)  Utilities: Not At Risk (11/20/2022)  Depression (PHQ2-9): Low Risk  (06/30/2022)  Tobacco Use: High Risk (11/20/2022)     Readmission Risk Interventions     No data to display

## 2022-11-28 NOTE — Progress Notes (Addendum)
Attempted to call report was sent to voice mail. 2nd attempt with no contact. Case mgt. Notified.

## 2022-11-28 NOTE — Progress Notes (Signed)
Pt is refusing to be discharged to facility once EMS arrived, nursing supervisor attempted to address patient's concerns about discharge, pt still remains adamant that he does not want to be discharged until tomorrow, pt is alert and oriented, nursing supervisor notified MD about situation and sent social worker notification so they can follow up with pt tomorrow.

## 2022-11-28 NOTE — Progress Notes (Signed)
   11/28/22 1707  Spiritual Encounters  Type of Visit Follow up  Care provided to: Patient  Conversation partners present during encounter Nurse  Referral source Patient request  Reason for visit Grief/loss  OnCall Visit No  Spiritual Framework  Presenting Themes Meaning/purpose/sources of inspiration;Goals in life/care;Courage hope and growth  Community/Connection Family;Friend(s)  Patient Stress Factors Health changes;Major life changes  Family Stress Factors None identified  Interventions  Spiritual Care Interventions Made Compassionate presence;Normalization of emotions;Narrative/life review;Bereavement/grief support;Meaning making  Intervention Outcomes  Outcomes Connection to spiritual care;Awareness of support;Reduced anxiety;Reduced fear;Autonomy/agency  Spiritual Care Plan  Spiritual Care Issues Still Outstanding Chaplain will continue to follow   Found John Hicks laying in hospital bed with eyes open. He welcomed Chaplain bedside and reflected openly about how he is afraid of going to SNF and other emotions related to his terminal diagnosis. He shared his fears and concerns concerning death and reflected openly regarding his spirituality. He was honest about his struggle with death and dying and how it related to his beliefs. Chaplain provided a safe space for him to reflect, normalized his feelings, and assisted him in attaching meaning to his life in ways he could not accept before this diagnosis. John Hicks thanked chaplain for visit and this Clinical research associate prayed with him before end of visit. Will remain available in order to provide spiritual support and to assess for spiritual need.   Rev. Jolyn Lent, M.Div Chaplain

## 2022-11-28 NOTE — Plan of Care (Signed)

## 2022-11-28 NOTE — Discharge Summary (Signed)
Physician Discharge Summary  John Hicks ZOX:[id number] DOB: Apr 20, 1951 DOA: 11/20/2022  PCP: Elfredia Nevins, MD Oncologist: Ellin Saba   Admit date: 11/20/2022 Discharge date: 11/28/2022  Admitted From: Home  Disposition: SNF [facility]   Recommendations for Outpatient Follow-up:  Follow up with Oncologist as scheduled  Outpatient palliative care consultation recommended  Wound care instruction for perineum: continue to pack with saline gauze wet to dry twice daily and keep area clean.  Please consult wound care provider to follow wound until healed.    Home Health: SNF   Discharge Condition: HOSPICE   CODE STATUS: DNR DIET: full liquid, advance to soft foods as tolerated    Brief Hospitalization Summary: Please see all hospital notes, images, labs for full details of the hospitalization.  ADMISSION PROVIDER HPI:  72 y.o. male with medical history significant of AAA, atrial fibrillation, anxiety, BPH, CHF, hypertension, hypothyroidism, MDS, CAD, and more presents the ED with a chief complaint of generalized weakness.  Patient reports that he has had multiple falls, has not been able to get up off the ground, and has had generalized weakness.  Patient is currently on chemo.  He gets chemo for 7 days and then gets 28 days off.  His last chemo treatment was 2 weeks ago.  Patient has developed diarrhea since having chemo per chart review.  Patient reports that he has developed of diarrhea since being admitted last.  He reports he is having diarrhea about 10 times per day.  It is nonbloody.  It is causing him to have perianal soreness and tenderness.  He reports stomach cramping pain.  The pain is intermittent.  Usually occurs directly before bowel movement.  He does not always have control over the bowel movements and has to wear depends.  Has had no p.o. intake for 4 days but continues to have diarrhea.  He reports he only drinks a little bit of water with his meds in the morning.  He denies  any fevers.  He has taken Imodium but he does not think it has helped with his diarrhea.  Patient reports he has had generalized weakness.  He used to be able to ambulate, independently go to the grocery store, and function as "a normal person."  Since having chemo he has been "a total wreck."  He reports he cannot walk, he bruised from falling.  He has right foot pain.  Patient is most concerned about this right foot pain.  He reports that it is painful all the time.  It is worse when he bears weight.  Is swollen compared to the left foot.  He describes the pain as sharp.  He reports that this has been going on for several days.  He does not associate it with any of his falls.  Patient has no other complaints at this time.   Patient reports he quit smoking 2 weeks ago.  He does not drink alcohol.  He is vaccinated for COVID.   Patient does have ACP documents which specifically say he would not want his life to be prolonged if he had a condition that cannot be cured, if he becomes unconscious and his doctors determine that he will never regain consciousness, or if he suffers from advanced dementia.  He does not have a DNR on file.  HOSPITAL COURSE BY PROBLEM   Dehydration - TREATED and RESOLVED  - Secondary to GI losses - 2 L in the ED - improved with IV hydration - Encourage p.o. fluid intake -  creatinine trending down with treatment (now down to 1.49)    Chronic anemia / thrombocytopenia - Hemoglobin stable at 7.5 after 1 unit PRBC given 7/3 - Continue folic acid - Hg improved to 7.5 after transfusion     Right foot pain - improved - Patient's main concern is his right foot pain - Minimal swelling - He has had multiple falls but does not think that he is specifically hurt the foot during the falls - X-ray right foot - degenerative changes noted  - Due to the swelling and the fact the patient is in cancer treatment, we will get a ultrasound DVT right lower extremity as well -- NEG FOR DVT   - Pain control with pain scale - check uric acid - 4.5 within normal limits but suspect this is acute gout, treated with IV solumedrol and colchicine and oxycodone tablets   Chronic Perineal Wounds - continue wet to dry saline packing dressing and keep area clean  - wound care nursing to follow at SNF recommended   Atrial fibrillation, chronic  - Continue amiodarone and Coreg - Continue aspirin   Prolonged QT interval - History of prolonged QTc   Diarrhea A/W NOROVIRUS - Last discharged on June 20 and patient reports he has had diarrhea since then - He did receive antibiotics during that hospitalization for UTI - Today C. difficile is negative - GI panel POSITIVE FOR NOROVIRUS - Imodium for symptom control - Full liquid diet--advance diet as tolerated to soft foods  - improved with supportive measures    MDS (myelodysplastic syndrome)  - Dr. Kirtland Bouchard consulted from the ED recommended admission for hydration - Patient gets 7 days of chemo and then 28 days off - His last chemo was 2 weeks ago - pt does not want to do more chemo at this time - Hg 7.3-7.5  -- he has follow up appt with Dr Ellin Saba on 7/15 for office visit    AKI (acute kidney injury) - treated - Creatinine increased to 2.29 from 1.89 - Secondary to dehydration secondary to GI losses - Continue IV hydration - reduce rate to 60 cc/hr - Hold nephrotoxic agents when possible - creatinine down to 1.49   Essential hypertension - Continue Coreg   MASD - barrier creme applied and skin care    Discharge Diagnoses:  Principal Problem:   Dehydration Active Problems:   Essential hypertension   AKI (acute kidney injury) (HCC)   [name]   Diarrhea A/W NOROVIRUS   Prolonged QT interval   Atrial fibrillation, chronic (HCC)   Pressure injury of skin   Right foot pain   Chronic anemia   Discharge Instructions:  Allergies as of 11/28/2022       Reactions   Codeine Other (See Comments)    agitation        Medication List     STOP taking these medications    azaCITIDine 5 mg/2 mLs in lactated ringers infusion   CoQ10 200 MG Caps   Flaxseed Oil 1000 MG Caps   furosemide 20 MG tablet Commonly known as: LASIX   lidocaine-prilocaine cream Commonly known as: EMLA   potassium chloride SA 20 MEQ tablet Commonly known as: KLOR-CON M   prochlorperazine 10 MG tablet Commonly known as: COMPAZINE       TAKE these medications    (feeding supplement) PROSource Plus liquid Take 60 mLs by mouth 3 (three) times daily between meals.   feeding supplement Liqd Take 237 mLs by mouth  2 (two) times daily between meals.   acetaminophen 325 MG tablet Commonly known as: TYLENOL Take 2 tablets (650 mg total) by mouth every 6 (six) hours as needed for mild pain (or Fever >/= 101).   ALPRAZolam 0.5 MG tablet Commonly known as: XANAX Take 1 tablet (0.5 mg total) by mouth 2 (two) times daily as needed for anxiety. What changed:  medication strength how much to take when to take this   amiodarone 200 MG tablet Commonly known as: PACERONE Take 200 mg by mouth daily at 2 PM.   aspirin 81 MG chewable tablet Chew 81 mg by mouth daily at 2 PM.   carvedilol 3.125 MG tablet Commonly known as: COREG Take 1 tablet (3.125 mg total) by mouth 2 (two) times daily. What changed:  medication strength how much to take   folic acid 1 MG tablet Commonly known as: FOLVITE Take 1 mg by mouth daily at 2 PM. One daily   levothyroxine 25 MCG tablet Commonly known as: SYNTHROID Take 25 mcg by mouth daily at 2 PM.   loperamide 2 MG capsule Commonly known as: IMODIUM Take 1 capsule (2 mg total) by mouth every 6 (six) hours as needed for diarrhea or loose stools.   ondansetron 4 MG tablet Commonly known as: Zofran Take 1 tablet (4 mg total) by mouth every 8 (eight) hours as needed for vomiting or nausea.   oxyCODONE 5 MG immediate release tablet Commonly known as: Oxy  IR/ROXICODONE Take 1-2 tablets (5-10 mg total) by mouth every 6 (six) hours as needed for up to 3 days for moderate pain or severe pain.   tamsulosin 0.4 MG Caps capsule Commonly known as: FLOMAX Take 0.4 mg by mouth daily at 2 PM.   Vitamin D3 50 MCG (2000 UT) capsule Take 2,000 Units by mouth daily at 2 PM. 1400               Durable Medical Equipment  (From admission, onward)           Start     Ordered   11/21/22 1236  For home use only DME Walker rolling  Once       Comments: Patient unable to complete sit to stands or walk with a cane, required use of RW with good carryover for use demonstrated.  Question Answer Comment  Walker: With 5 Inch Wheels   Patient needs a walker to treat with the following condition Gait difficulty      11/21/22 1236            Contact information for follow-up providers     Doreatha Massed, MD. Go on 12/04/2022.   Specialty: Hematology Why: Hospital Follow Up Contact information: [address] [place] [address] [phone]              Contact information for after-discharge care     Destination     [facility] Preferred SNF .   Service: Skilled Nursing Contact information: [address] [place] [address] [phone]                    Allergies  Allergen Reactions   Codeine Other (See Comments)    agitation   Allergies as of 11/28/2022       Reactions   Codeine Other (See Comments)   agitation        Medication List     STOP taking these medications  azaCITIDine 5 mg/2 mLs in lactated ringers infusion   CoQ10 200 MG Caps   Flaxseed Oil 1000 MG Caps   furosemide 20 MG tablet Commonly known as: LASIX   lidocaine-prilocaine cream Commonly known as: EMLA   potassium chloride SA 20 MEQ tablet Commonly known as: KLOR-CON M   prochlorperazine 10 MG tablet Commonly known as: COMPAZINE       TAKE these  medications    (feeding supplement) PROSource Plus liquid Take 60 mLs by mouth 3 (three) times daily between meals.   feeding supplement Liqd Take 237 mLs by mouth 2 (two) times daily between meals.   acetaminophen 325 MG tablet Commonly known as: TYLENOL Take 2 tablets (650 mg total) by mouth every 6 (six) hours as needed for mild pain (or Fever >/= 101).   ALPRAZolam 0.5 MG tablet Commonly known as: XANAX Take 1 tablet (0.5 mg total) by mouth 2 (two) times daily as needed for anxiety. What changed:  medication strength how much to take when to take this   amiodarone 200 MG tablet Commonly known as: PACERONE Take 200 mg by mouth daily at 2 PM.   aspirin 81 MG chewable tablet Chew 81 mg by mouth daily at 2 PM.   carvedilol 3.125 MG tablet Commonly known as: COREG Take 1 tablet (3.125 mg total) by mouth 2 (two) times daily. What changed:  medication strength how much to take   folic acid 1 MG tablet Commonly known as: FOLVITE Take 1 mg by mouth daily at 2 PM. One daily   levothyroxine 25 MCG tablet Commonly known as: SYNTHROID Take 25 mcg by mouth daily at 2 PM.   loperamide 2 MG capsule Commonly known as: IMODIUM Take 1 capsule (2 mg total) by mouth every 6 (six) hours as needed for diarrhea or loose stools.   ondansetron 4 MG tablet Commonly known as: Zofran Take 1 tablet (4 mg total) by mouth every 8 (eight) hours as needed for vomiting or nausea.   oxyCODONE 5 MG immediate release tablet Commonly known as: Oxy IR/ROXICODONE Take 1-2 tablets (5-10 mg total) by mouth every 6 (six) hours as needed for up to 3 days for moderate pain or severe pain.   tamsulosin 0.4 MG Caps capsule Commonly known as: FLOMAX Take 0.4 mg by mouth daily at 2 PM.   Vitamin D3 50 MCG (2000 UT) capsule Take 2,000 Units by mouth daily at 2 PM. 1400               Durable Medical Equipment  (From admission, onward)           Start     Ordered   11/21/22 1236  For home  use only DME Walker rolling  Once       Comments: Patient unable to complete sit to stands or walk with a cane, required use of RW with good carryover for use demonstrated.  Question Answer Comment  Walker: With 5 Inch Wheels   Patient needs a walker to treat with the following condition Gait difficulty      11/21/22 1236            Procedures/Studies: DG CHEST PORT 1 VIEW  Result Date: 11/25/2022 CLINICAL DATA:  Fever EXAM: PORTABLE CHEST 1 VIEW COMPARISON:  11/06/2022 FINDINGS: Right-sided chest port remains in place. Stable mild cardiomegaly. Aortic atherosclerosis. Coarsened interstitial markings bilaterally. No lobar consolidation. No pleural effusion or pneumothorax. IMPRESSION: Coarsened interstitial markings bilaterally, which may reflect bronchitic type lung changes. No  lobar consolidation. Electronically Signed   By: Duanne Guess D.O.   On: 11/25/2022 12:47   US Venous Img Lower Unilateral Right (DVT)  Result Date: 11/21/2022 CLINICAL DATA:  72 year old male with a history of foot pain EXAM: RIGHT LOWER EXTREMITY VENOUS DOPPLER ULTRASOUND TECHNIQUE: Gray-scale sonography with graded compression, as well as color Doppler and duplex ultrasound were performed to evaluate the lower extremity deep venous systems from the level of the common femoral vein and including the common femoral, femoral, profunda femoral, popliteal and calf veins including the posterior tibial, peroneal and gastrocnemius veins when visible. The superficial great saphenous vein was also interrogated. Spectral Doppler was utilized to evaluate flow at rest and with distal augmentation maneuvers in the common femoral, femoral and popliteal veins. COMPARISON:  None Available. FINDINGS: Contralateral Common Femoral Vein: Respiratory phasicity is normal and symmetric with the symptomatic side. No evidence of thrombus. Normal compressibility. Common Femoral Vein: No evidence of thrombus. Normal compressibility,  respiratory phasicity and response to augmentation. Saphenofemoral Junction: No evidence of thrombus. Normal compressibility and flow on color Doppler imaging. Profunda Femoral Vein: No evidence of thrombus. Normal compressibility and flow on color Doppler imaging. Femoral Vein: No evidence of thrombus. Normal compressibility, respiratory phasicity and response to augmentation. Popliteal Vein: No evidence of thrombus. Normal compressibility, respiratory phasicity and response to augmentation. Calf Veins: No evidence of thrombus. Normal compressibility and flow on color Doppler imaging. Superficial Great Saphenous Vein: No evidence of thrombus. Normal compressibility and flow on color Doppler imaging. Other Findings:  None. IMPRESSION: Directed duplex of the right lower extremity negative for DVT Signed, Yvone Neu. Miachel Roux, RPVI [facility] Electronically Signed   By: Gilmer Mor D.O.   On: 11/21/2022 09:04   DG Foot 2 Views Right  Result Date: 11/21/2022 CLINICAL DATA:  Right foot pain and swelling, initial encounter EXAM: RIGHT FOOT - 2 VIEW COMPARISON:  None Available. FINDINGS: Mild calcaneal spurring is seen. Degenerative changes of the tibiotalar joint and tarsal bones are seen. Mild soft tissue swelling is noted about the metatarsals. No acute fracture or dislocation is seen. IMPRESSION: Mild degenerative change without acute bony abnormality. Electronically Signed   By: Alcide Clever M.D.   On: 11/21/2022 03:52   DG Chest Portable 1 View  Result Date: 11/06/2022 CLINICAL DATA:  Cough. EXAM: PORTABLE CHEST 1 VIEW COMPARISON:  Chest radiograph dated 10/12/2022. FINDINGS: Right-sided Port-A-Cath in similar position. No focal consolidation, pleural effusion, or pneumothorax. Stable cardiomegaly. Atherosclerotic calcification of the aortic arch. No acute osseous pathology. IMPRESSION: 1. No active disease. 2. Cardiomegaly. Electronically  Signed   By: Elgie Collard M.D.   On: 11/06/2022 19:34   CT ABDOMEN PELVIS WO CONTRAST  Result Date: 11/06/2022 CLINICAL DATA:  Diarrhea, lower abdominal pain. Hypotension with sepsis. Patient has leukemia. EXAM: CT ABDOMEN AND PELVIS WITHOUT CONTRAST TECHNIQUE: Multidetector CT imaging of the abdomen and pelvis was performed following the standard protocol without IV contrast. RADIATION DOSE REDUCTION: This exam was performed according to the departmental dose-optimization program which includes automated exposure control, adjustment of the mA and/or kV according to patient size and/or use of iterative reconstruction technique. COMPARISON:  CT 08/23/2022 without contrast FINDINGS: Lower chest: Heart is enlarged. Coronary artery calcifications are seen. There is some linear opacity at the lung bases likely scar or atelectasis. Tiny left effusion. Mildly patulous esophagus. Hepatobiliary: Stones in the gallbladder. On this non IV contrast exam, the liver parenchyma is grossly preserved. Pancreas: Mild pancreatic  atrophy. Spleen: Spleen is enlarged with cephalocaudal length of 13.9 cm. Spleen is slightly heterogeneous. Adrenals/Urinary Tract: Slight nonspecific nodular thickening of the adrenal glands. Focal area on the right side measures 12 mm on series 3, image 22 measuring Hounsfield unit of 4, a benign adenoma. Numerous bilateral small renal cysts are identified again Bosniak 1 and 2 lesions. No specific imaging follow-up. No obstructing renal or ureteral stones. Slight ectasia of the renal collecting systems and ureters down to the bladder. The bladder wall is diffusely slightly thickened with some trabeculation and there is an enlarged prostate. Stomach/Bowel: No oral contrast. Stomach is underdistended. Small bowel is nondilated. Slight wall thickening along the sigmoid colon but the bowel is underdistended. Slight adjacent stranding. Please correlate for any clinical evidence of colitis. No proximal  obstruction. Appendix is poorly seen in the right lower quadrant but no pericecal stranding or fluid. Vascular/Lymphatic: Aortic endograft in place, incompletely evaluated on this noncontrast examination. Diameter of the aneurysm previously was measured at 6.5 cm and today when measured in the same fashion as the prior measures 6.5 cm. Dedicated workup as clinically appropriate. Reproductive: Enlarged prostate with mass effect along the base of the bladder. Other: No free intra-air.  Small fat containing inguinal hernias. Musculoskeletal: Curvature of the spine with degenerative changes. Multilevel stenosis. IMPRESSION: Subtle wall thickening along the sigmoid colon with stranding. A subtle colitis is possible. No obstruction or free air. No free fluid. Ectatic renal collecting systems dome of the bladder. The bladder wall is thickened and trabeculated and there is a large prostate with mass effect along the base of the bladder. No obstructing stones. Enlarged heart. Gallstones.  Gallbladder is nondilated. Splenomegaly. Aortic endograft with the abdominal aortic aneurysm 6.5 cm, similar to previous. Overall the endograft in the aneurysm is incompletely evaluated on this noncontrast exam. If needed dedicated endograft protocol postcontrast study could be considered if needed clinically Electronically Signed   By: Karen Kays M.D.   On: 11/06/2022 19:31     Subjective: Pt having gout like pain in right foot   Discharge Exam: Vitals:   11/27/22 2250 11/28/22 0436  BP: 135/65 136/69  Pulse: 63 62  Resp: 16 18  Temp: 97.9 F (36.6 C) (!) 97.5 F (36.4 C)  SpO2: 93% 91%   Vitals:   11/27/22 1414 11/27/22 1826 11/27/22 2250 11/28/22 0436  BP: 127/74 (!) 92/54 135/65 136/69  Pulse: 68 72 63 62  Resp: 15  16 18   Temp: 98.4 F (36.9 C)  97.9 F (36.6 C) (!) 97.5 F (36.4 C)  TempSrc: Oral   Oral  SpO2: 96%  93% 91%  Weight:      Height:        General exam: chronically ill appearing male,  Appears calm and comfortable  Respiratory system: Clear to auscultation. Respiratory effort normal. Cardiovascular system: normal S1 & S2 heard. No JVD, murmurs, rubs, gallops or clicks. No pedal edema. Gastrointestinal system: Abdomen is nondistended, soft and nontender. No organomegaly or masses felt. Normal bowel sounds heard. Central nervous system: Alert and oriented. No focal neurological deficits. Extremities: Symmetric 5 x 5 power. Skin: perineal ulceration chronic wound      The results of significant diagnostics from this hospitalization (including imaging, microbiology, ancillary and laboratory) are listed below for reference.     Microbiology: Recent Results (from the past 240 hour(s))  Gastrointestinal Panel by PCR , Stool     Status: Abnormal   Collection Time: 11/20/22  9:46 PM  Specimen: Stool  Result Value Ref Range Status   Campylobacter species NOT DETECTED NOT DETECTED Final   Plesimonas shigelloides NOT DETECTED NOT DETECTED Final   Salmonella species NOT DETECTED NOT DETECTED Final   Yersinia enterocolitica NOT DETECTED NOT DETECTED Final   Vibrio species NOT DETECTED NOT DETECTED Final   Vibrio cholerae NOT DETECTED NOT DETECTED Final   Enteroaggregative E coli (EAEC) NOT DETECTED NOT DETECTED Final   Enteropathogenic E coli (EPEC) NOT DETECTED NOT DETECTED Final   Enterotoxigenic E coli (ETEC) NOT DETECTED NOT DETECTED Final   Shiga like toxin producing E coli (STEC) NOT DETECTED NOT DETECTED Final   Shigella/Enteroinvasive E coli (EIEC) NOT DETECTED NOT DETECTED Final   Cryptosporidium NOT DETECTED NOT DETECTED Final   Cyclospora cayetanensis NOT DETECTED NOT DETECTED Final   Entamoeba histolytica NOT DETECTED NOT DETECTED Final   Giardia lamblia NOT DETECTED NOT DETECTED Final   Adenovirus F40/41 NOT DETECTED NOT DETECTED Final   Astrovirus NOT DETECTED NOT DETECTED Final   Norovirus GI/GII DETECTED (A) NOT DETECTED Final    Comment: RESULT CALLED TO,  READ BACK BY AND VERIFIED WITH: VAL DILDY 11/21/22 1516 KLW    Rotavirus A NOT DETECTED NOT DETECTED Final   Sapovirus (I, II, IV, and V) NOT DETECTED NOT DETECTED Final    Comment: Performed at [facility], [address] [place], [place] [address]  C Difficile Quick Screen w PCR reflex     Status: None   Collection Time: 11/20/22  9:46 PM   Specimen: Stool  Result Value Ref Range Status   C Diff antigen NEGATIVE NEGATIVE Final   C Diff toxin NEGATIVE NEGATIVE Final   C Diff interpretation No C. difficile detected.  Final    Comment: Performed at [facility], [address] [place], [place] [address]  Culture, blood (single) w Reflex to ID Panel     Status: None (Preliminary result)   Collection Time: 11/25/22  8:57 AM   Specimen: BLOOD LEFT HAND  Result Value Ref Range Status   Specimen Description BLOOD LEFT HAND  Final   Special Requests   Final    BOTTLES DRAWN AEROBIC AND ANAEROBIC Blood Culture adequate volume   Culture   Final    NO GROWTH 3 DAYS Performed at [facility], [address], [place], [place] [address]    Report Status PENDING  Incomplete     Labs: BNP (last 3 results) No results for input(s): "BNP" in the last 8760 hours. Basic Metabolic Panel: Recent Labs  Lab 11/22/22 0424 11/23/22 0415  NA 137 137  K 3.5 3.4*  CL 114* 114*  CO2 17* 18*  GLUCOSE 101* 109*  BUN 40* 37*  CREATININE 1.65* 1.49*  CALCIUM 7.7* 7.8*  MG  --  2.0   Liver Function Tests: No results for input(s): "AST", "ALT", "ALKPHOS", "BILITOT", "PROT", "ALBUMIN" in the last 168 hours. No results for input(s): "LIPASE", "AMYLASE" in the last 168 hours. No results for input(s): "AMMONIA" in the last 168 hours. CBC: Recent Labs  Lab 11/22/22 0424 11/23/22 0415 11/25/22 0446 11/27/22 0422  WBC 3.6* 3.7* 3.1* 2.6*  HGB 6.8* 7.5* 7.3* 7.3*  HCT 21.1* 23.5* 23.5* 23.1*  MCV 102.4* 102.2* 102.6* 102.7*  PLT 132* 107* 93* 105*   Cardiac Enzymes: No results for  input(s): "CKTOTAL", "CKMB", "CKMBINDEX", "TROPONINI" in the last 168 hours. BNP: Invalid input(s): "POCBNP" CBG: No results for input(s): "GLUCAP" in the last 168 hours. D-Dimer No results for input(s): "  DDIMER" in the last 72 hours. Hgb A1c No results for input(s): "HGBA1C" in the last 72 hours. Lipid Profile No results for input(s): "CHOL", "HDL", "LDLCALC", "TRIG", "CHOLHDL", "LDLDIRECT" in the last 72 hours. Thyroid function studies No results for input(s): "TSH", "T4TOTAL", "T3FREE", "THYROIDAB" in the last 72 hours.  Invalid input(s): "FREET3" Anemia work up No results for input(s): "VITAMINB12", "FOLATE", "FERRITIN", "TIBC", "IRON", "RETICCTPCT" in the last 72 hours. Urinalysis    Component Value Date/Time   COLORURINE AMBER (A) 11/25/2022 1632   APPEARANCEUR CLEAR 11/25/2022 1632   LABSPEC 1.016 11/25/2022 1632   PHURINE 5.0 11/25/2022 1632   GLUCOSEU NEGATIVE 11/25/2022 1632   HGBUR NEGATIVE 11/25/2022 1632   BILIRUBINUR NEGATIVE 11/25/2022 1632   KETONESUR NEGATIVE 11/25/2022 1632   PROTEINUR 30 (A) 11/25/2022 1632   NITRITE NEGATIVE 11/25/2022 1632   LEUKOCYTESUR NEGATIVE 11/25/2022 1632   Sepsis Labs Recent Labs  Lab 11/22/22 0424 11/23/22 0415 11/25/22 0446 11/27/22 0422  WBC 3.6* 3.7* 3.1* 2.6*   Microbiology Recent Results (from the past 240 hour(s))  Gastrointestinal Panel by PCR , Stool     Status: Abnormal   Collection Time: 11/20/22  9:46 PM   Specimen: Stool  Result Value Ref Range Status   Campylobacter species NOT DETECTED NOT DETECTED Final   Plesimonas shigelloides NOT DETECTED NOT DETECTED Final   Salmonella species NOT DETECTED NOT DETECTED Final   Yersinia enterocolitica NOT DETECTED NOT DETECTED Final   Vibrio species NOT DETECTED NOT DETECTED Final   Vibrio cholerae NOT DETECTED NOT DETECTED Final   Enteroaggregative E coli (EAEC) NOT DETECTED NOT DETECTED Final   Enteropathogenic E coli (EPEC) NOT DETECTED NOT DETECTED Final    Enterotoxigenic E coli (ETEC) NOT DETECTED NOT DETECTED Final   Shiga like toxin producing E coli (STEC) NOT DETECTED NOT DETECTED Final   Shigella/Enteroinvasive E coli (EIEC) NOT DETECTED NOT DETECTED Final   Cryptosporidium NOT DETECTED NOT DETECTED Final   Cyclospora cayetanensis NOT DETECTED NOT DETECTED Final   Entamoeba histolytica NOT DETECTED NOT DETECTED Final   Giardia lamblia NOT DETECTED NOT DETECTED Final   Adenovirus F40/41 NOT DETECTED NOT DETECTED Final   Astrovirus NOT DETECTED NOT DETECTED Final   Norovirus GI/GII DETECTED (A) NOT DETECTED Final    Comment: RESULT CALLED TO, READ BACK BY AND VERIFIED WITH: VAL DILDY 11/21/22 1516 KLW    Rotavirus A NOT DETECTED NOT DETECTED Final   Sapovirus (I, II, IV, and V) NOT DETECTED NOT DETECTED Final    Comment: Performed at [facility], [address] [place], [place] [address]  C Difficile Quick Screen w PCR reflex     Status: None   Collection Time: 11/20/22  9:46 PM   Specimen: Stool  Result Value Ref Range Status   C Diff antigen NEGATIVE NEGATIVE Final   C Diff toxin NEGATIVE NEGATIVE Final   C Diff interpretation No C. difficile detected.  Final    Comment: Performed at [facility], [address], [place], [place] [address]  Culture, blood (single) w Reflex to ID Panel     Status: None (Preliminary result)   Collection Time: 11/25/22  8:57 AM   Specimen: BLOOD LEFT HAND  Result Value Ref Range Status   Specimen Description BLOOD LEFT HAND  Final   Special Requests   Final    BOTTLES DRAWN AEROBIC AND ANAEROBIC Blood Culture adequate volume   Culture   Final    NO GROWTH 3 DAYS Performed at [facility], [address], [place], [place] [address]    Report Status PENDING  Incomplete    Time coordinating discharge:  44 mins   SIGNED:  Standley Dakins, MD  [facility] 11/28/2022, 11:33 AM How to contact the [facility] Attending or Consulting provider 7A - 7P or covering provider during after hours  7P -7A, for this patient?  Check the care team in [facility] and look for a) attending/consulting [facility] provider listed and b) the [facility] team listed Log into [email or web address] and use [place]'s universal password to access. If you do not have the password, please contact the hospital operator. Locate the [facility] provider you are looking for under [facility] and page to a number that you can be directly reached. If you still have difficulty reaching the provider, please page the [facility] (Director on Call) for the Hospitalists listed on amion for assistance.

## 2022-11-28 NOTE — Care Management Important Message (Signed)
Important Message  Patient Details  Name: John Hicks MRN: [id number] Date of Birth: Aug 31, 1950   Medicare Important Message Given:  Yes (spoke with  John Hicks at [phone] to review letter, no additional copy needed)     Corey Harold 11/28/2022, 11:45 AM

## 2022-11-28 NOTE — Progress Notes (Signed)
Multiple attempts to call report with no answer

## 2022-11-28 NOTE — Discharge Instructions (Signed)
Perineal Wound Care Instructions: continue packing with saline wet to dry twice daily and keeping area clean.  Consult wound care nurse to follow wound until healed.

## 2022-11-29 ENCOUNTER — Inpatient Hospital Stay: Payer: PPO

## 2022-11-29 ENCOUNTER — Encounter: Payer: Self-pay | Admitting: *Deleted

## 2022-11-29 DIAGNOSIS — M6281 Muscle weakness (generalized): Secondary | ICD-10-CM | POA: Diagnosis not present

## 2022-11-29 DIAGNOSIS — I714 Abdominal aortic aneurysm, without rupture, unspecified: Secondary | ICD-10-CM | POA: Diagnosis not present

## 2022-11-29 DIAGNOSIS — I5042 Chronic combined systolic (congestive) and diastolic (congestive) heart failure: Secondary | ICD-10-CM | POA: Diagnosis not present

## 2022-11-29 DIAGNOSIS — F419 Anxiety disorder, unspecified: Secondary | ICD-10-CM | POA: Diagnosis not present

## 2022-11-29 DIAGNOSIS — Z7982 Long term (current) use of aspirin: Secondary | ICD-10-CM | POA: Diagnosis not present

## 2022-11-29 DIAGNOSIS — L89893 Pressure ulcer of other site, stage 3: Secondary | ICD-10-CM | POA: Diagnosis not present

## 2022-11-29 DIAGNOSIS — S30817A Abrasion of anus, initial encounter: Secondary | ICD-10-CM | POA: Diagnosis not present

## 2022-11-29 DIAGNOSIS — I480 Paroxysmal atrial fibrillation: Secondary | ICD-10-CM | POA: Diagnosis not present

## 2022-11-29 DIAGNOSIS — A0811 Acute gastroenteropathy due to Norwalk agent: Secondary | ICD-10-CM | POA: Diagnosis not present

## 2022-11-29 DIAGNOSIS — W19XXXA Unspecified fall, initial encounter: Secondary | ICD-10-CM | POA: Diagnosis not present

## 2022-11-29 DIAGNOSIS — I482 Chronic atrial fibrillation, unspecified: Secondary | ICD-10-CM | POA: Diagnosis not present

## 2022-11-29 DIAGNOSIS — I509 Heart failure, unspecified: Secondary | ICD-10-CM | POA: Diagnosis not present

## 2022-11-29 DIAGNOSIS — R278 Other lack of coordination: Secondary | ICD-10-CM | POA: Diagnosis not present

## 2022-11-29 DIAGNOSIS — N4289 Other specified disorders of prostate: Secondary | ICD-10-CM | POA: Diagnosis not present

## 2022-11-29 DIAGNOSIS — D469 Myelodysplastic syndrome, unspecified: Secondary | ICD-10-CM | POA: Diagnosis not present

## 2022-11-29 DIAGNOSIS — M79671 Pain in right foot: Secondary | ICD-10-CM | POA: Diagnosis not present

## 2022-11-29 DIAGNOSIS — L03213 Periorbital cellulitis: Secondary | ICD-10-CM | POA: Diagnosis not present

## 2022-11-29 DIAGNOSIS — Z79899 Other long term (current) drug therapy: Secondary | ICD-10-CM | POA: Diagnosis not present

## 2022-11-29 DIAGNOSIS — D4622 Refractory anemia with excess of blasts 2: Secondary | ICD-10-CM | POA: Diagnosis present

## 2022-11-29 DIAGNOSIS — N179 Acute kidney failure, unspecified: Secondary | ICD-10-CM | POA: Diagnosis not present

## 2022-11-29 DIAGNOSIS — D508 Other iron deficiency anemias: Secondary | ICD-10-CM | POA: Diagnosis not present

## 2022-11-29 DIAGNOSIS — D649 Anemia, unspecified: Secondary | ICD-10-CM | POA: Diagnosis not present

## 2022-11-29 DIAGNOSIS — E039 Hypothyroidism, unspecified: Secondary | ICD-10-CM | POA: Diagnosis not present

## 2022-11-29 DIAGNOSIS — D709 Neutropenia, unspecified: Secondary | ICD-10-CM | POA: Diagnosis not present

## 2022-11-29 DIAGNOSIS — E569 Vitamin deficiency, unspecified: Secondary | ICD-10-CM | POA: Diagnosis not present

## 2022-11-29 DIAGNOSIS — T50901A Poisoning by unspecified drugs, medicaments and biological substances, accidental (unintentional), initial encounter: Secondary | ICD-10-CM | POA: Diagnosis not present

## 2022-11-29 DIAGNOSIS — R9431 Abnormal electrocardiogram [ECG] [EKG]: Secondary | ICD-10-CM | POA: Diagnosis not present

## 2022-11-29 DIAGNOSIS — N400 Enlarged prostate without lower urinary tract symptoms: Secondary | ICD-10-CM | POA: Diagnosis not present

## 2022-11-29 DIAGNOSIS — R239 Unspecified skin changes: Secondary | ICD-10-CM | POA: Diagnosis not present

## 2022-11-29 DIAGNOSIS — E860 Dehydration: Secondary | ICD-10-CM | POA: Diagnosis not present

## 2022-11-29 DIAGNOSIS — R1312 Dysphagia, oropharyngeal phase: Secondary | ICD-10-CM | POA: Diagnosis not present

## 2022-11-29 DIAGNOSIS — R531 Weakness: Secondary | ICD-10-CM | POA: Diagnosis not present

## 2022-11-29 DIAGNOSIS — R197 Diarrhea, unspecified: Secondary | ICD-10-CM | POA: Diagnosis not present

## 2022-11-29 DIAGNOSIS — F17210 Nicotine dependence, cigarettes, uncomplicated: Secondary | ICD-10-CM | POA: Diagnosis not present

## 2022-11-29 DIAGNOSIS — N3946 Mixed incontinence: Secondary | ICD-10-CM | POA: Diagnosis not present

## 2022-11-29 DIAGNOSIS — I10 Essential (primary) hypertension: Secondary | ICD-10-CM | POA: Diagnosis not present

## 2022-11-29 DIAGNOSIS — L8990 Pressure ulcer of unspecified site, unspecified stage: Secondary | ICD-10-CM | POA: Diagnosis not present

## 2022-11-29 LAB — CULTURE, BLOOD (SINGLE)
Culture: NO GROWTH
Special Requests: ADEQUATE

## 2022-11-29 NOTE — Progress Notes (Signed)
[facility] was contacted by telephone and VM left for the transportation coordinator to verify upcoming appointments next week.  Requested a callback to confirm receipt.  Transportation to appointments were confirmed for the patient as being  [facility] .

## 2022-11-29 NOTE — Progress Notes (Addendum)
Patient seen and evaluated this a.m. with no acute overnight events noted.  He is in stable condition for discharge to [facility] today.  Patient will be going to SNF for rehab and not hospice-discharge condition was listed as hospice, but this is an error.  Please refer to discharge summary dictated 7/9 for full details.  Total care time: 15 minutes.

## 2022-11-29 NOTE — TOC Transition Note (Signed)
Transition of Care [facility]) - CM/SW Discharge Note   Patient Details  Name: John Hicks MRN: [id number] Date of Birth: 07-Apr-1951  Transition of [facility] CM/SW Contact:  Elliot Gault, LCSW Phone Number: 11/29/2022, 10:03 AM   Clinical Narrative:     Pt refused EMS transport to SNF last evening. Spoke with pt at bedside this AM to review dc plan. Emotional support offered to pt who expressed being real nervous about going to SNF. Provided additional education on what to expect at SNF and the rehab process. Pt reluctantly agreeable to the dc today.  Discussed with MD and RN. Updated Debbie at [facility] and they can admit. DC clinical sent electronically. RN to call report. EMS arranged for 1030.  No other TOC needs.  Final next level of care: Skilled Nursing Facility Barriers to Discharge: Barriers Resolved   Patient Goals and CMS Choice CMS Medicare.gov Compare Post Acute Care list provided to:: Patient Choice offered to / list presented to : Patient  Discharge Placement                  Patient to be transferred to facility by: EMS Name of family member notified: Ricky Patient and family notified of of transfer: 11/28/22  Discharge Plan and Services Additional resources added to the After Visit Summary for                                       Social Determinants of Health (SDOH) Interventions SDOH Screenings   Food Insecurity: No Food Insecurity (11/20/2022)  Housing: Low Risk  (11/20/2022)  Transportation Needs: No Transportation Needs (11/20/2022)  Utilities: Not At Risk (11/20/2022)  Depression (PHQ2-9): Low Risk  (06/30/2022)  Tobacco Use: High Risk (11/20/2022)     Readmission Risk Interventions     No data to display

## 2022-11-30 ENCOUNTER — Inpatient Hospital Stay: Payer: PPO

## 2022-11-30 DIAGNOSIS — A0811 Acute gastroenteropathy due to Norwalk agent: Secondary | ICD-10-CM | POA: Diagnosis not present

## 2022-11-30 DIAGNOSIS — E860 Dehydration: Secondary | ICD-10-CM | POA: Diagnosis not present

## 2022-11-30 DIAGNOSIS — R9431 Abnormal electrocardiogram [ECG] [EKG]: Secondary | ICD-10-CM | POA: Diagnosis not present

## 2022-11-30 DIAGNOSIS — D649 Anemia, unspecified: Secondary | ICD-10-CM | POA: Diagnosis not present

## 2022-11-30 DIAGNOSIS — M79671 Pain in right foot: Secondary | ICD-10-CM | POA: Diagnosis not present

## 2022-11-30 DIAGNOSIS — S30817A Abrasion of anus, initial encounter: Secondary | ICD-10-CM | POA: Diagnosis not present

## 2022-11-30 DIAGNOSIS — R197 Diarrhea, unspecified: Secondary | ICD-10-CM | POA: Diagnosis not present

## 2022-11-30 DIAGNOSIS — I482 Chronic atrial fibrillation, unspecified: Secondary | ICD-10-CM | POA: Diagnosis not present

## 2022-12-01 ENCOUNTER — Inpatient Hospital Stay: Payer: PPO

## 2022-12-01 DIAGNOSIS — R531 Weakness: Secondary | ICD-10-CM | POA: Diagnosis not present

## 2022-12-01 DIAGNOSIS — D469 Myelodysplastic syndrome, unspecified: Secondary | ICD-10-CM | POA: Diagnosis not present

## 2022-12-01 DIAGNOSIS — E860 Dehydration: Secondary | ICD-10-CM | POA: Diagnosis not present

## 2022-12-01 DIAGNOSIS — I482 Chronic atrial fibrillation, unspecified: Secondary | ICD-10-CM | POA: Diagnosis not present

## 2022-12-01 DIAGNOSIS — W19XXXA Unspecified fall, initial encounter: Secondary | ICD-10-CM | POA: Diagnosis not present

## 2022-12-01 DIAGNOSIS — A0811 Acute gastroenteropathy due to Norwalk agent: Secondary | ICD-10-CM | POA: Diagnosis not present

## 2022-12-01 DIAGNOSIS — D649 Anemia, unspecified: Secondary | ICD-10-CM | POA: Diagnosis not present

## 2022-12-01 DIAGNOSIS — R9431 Abnormal electrocardiogram [ECG] [EKG]: Secondary | ICD-10-CM | POA: Diagnosis not present

## 2022-12-01 IMAGING — US US SCROTUM W/ DOPPLER COMPLETE
1 series · 13 of 25 positions shown · non-contrast
Comparison: None Available.

CLINICAL DATA: Scrotal pain.

EXAM:
SCROTAL ULTRASOUND
DOPPLER ULTRASOUND OF THE TESTICLES
TECHNIQUE: Complete ultrasound examination of the testicles, epididymis, and
other scrotal structures was performed. Color and spectral Doppler
ultrasound were also utilized to evaluate blood flow to the
testicles.

[Series 1: us scrotum w/doppler · 59 acquisitions, 13 frames shown]
[im 1/59]
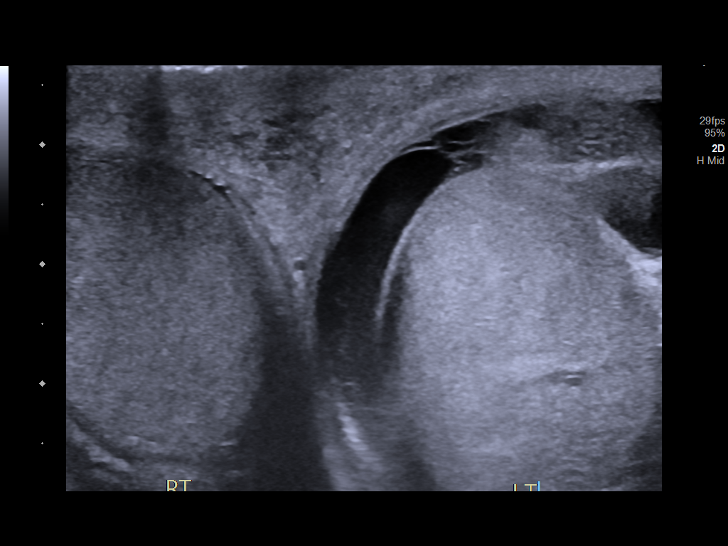
[im 5/59]
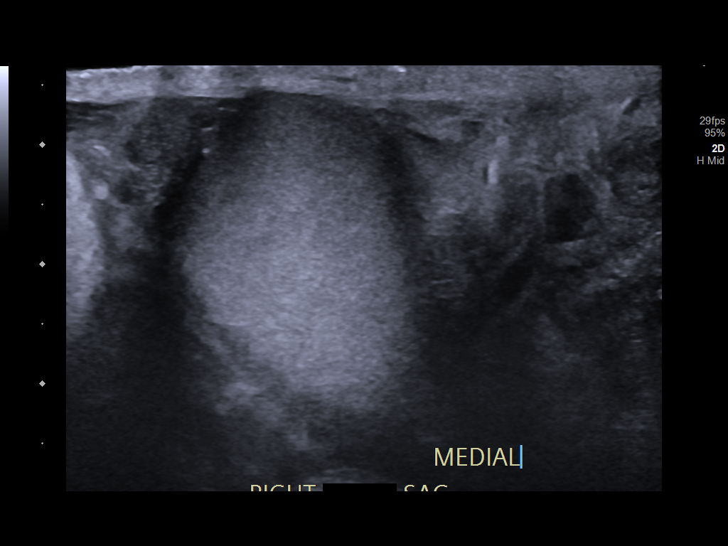
[im 10/59]
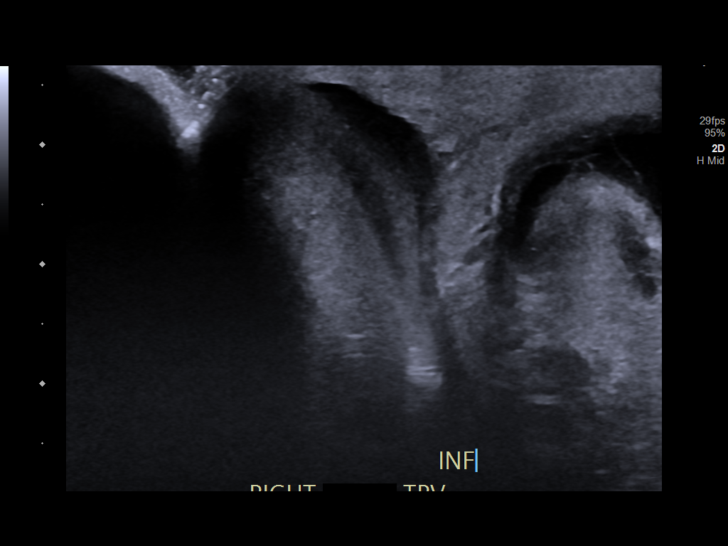
[im 15/59]
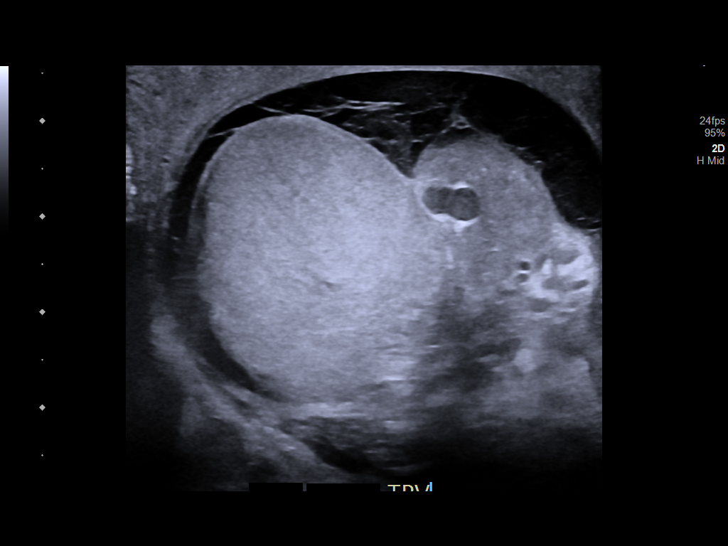
[im 20/59]
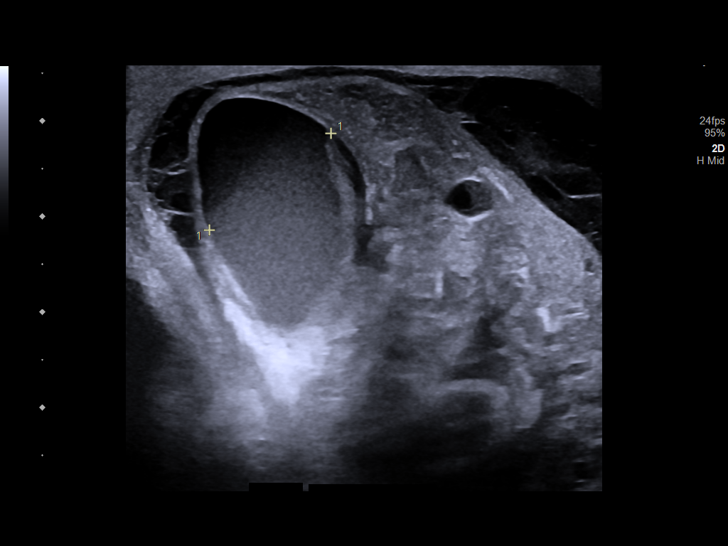
[im 25/59]
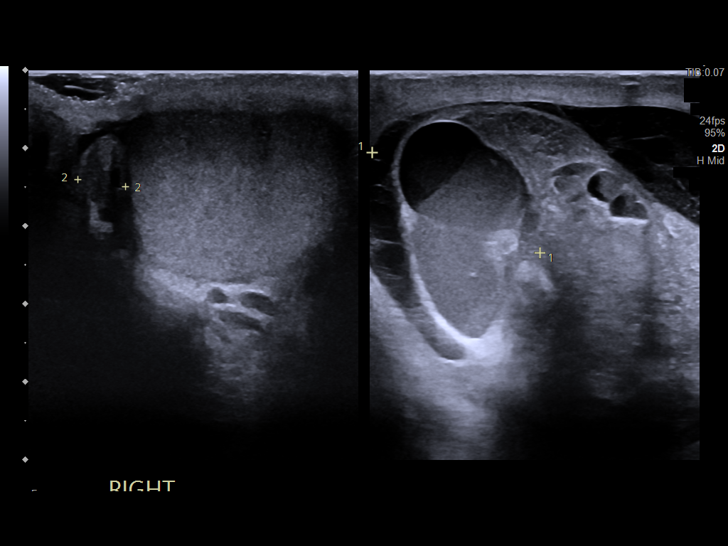
[im 30/59]
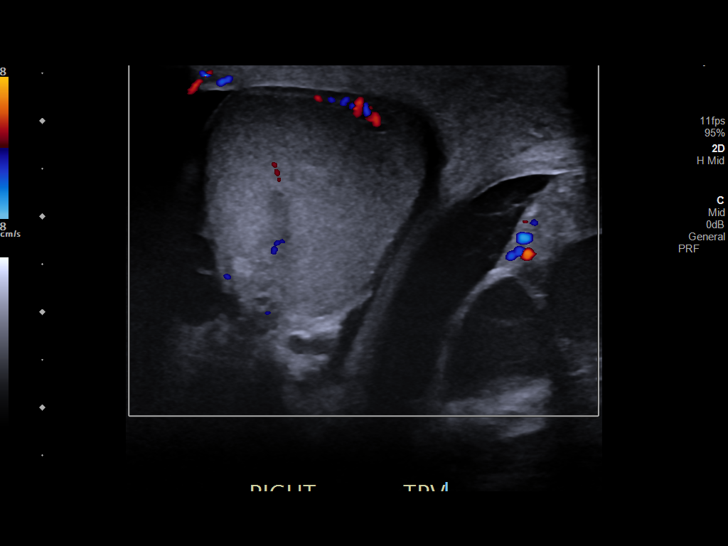
[im 34/59]
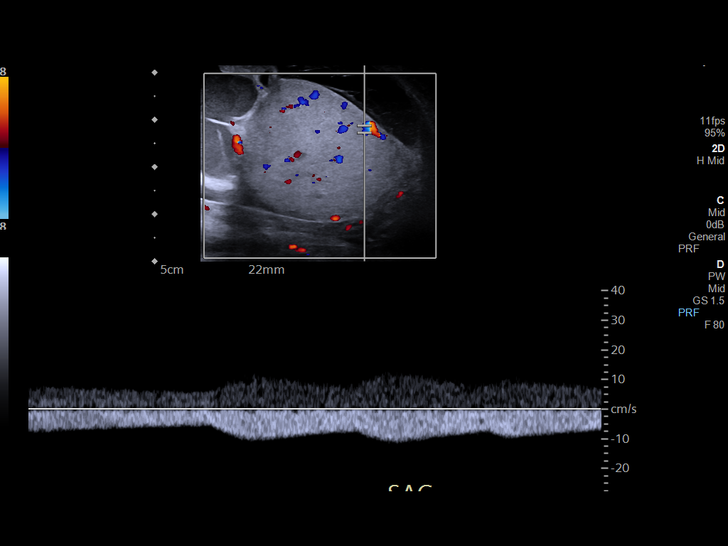
[im 39/59]
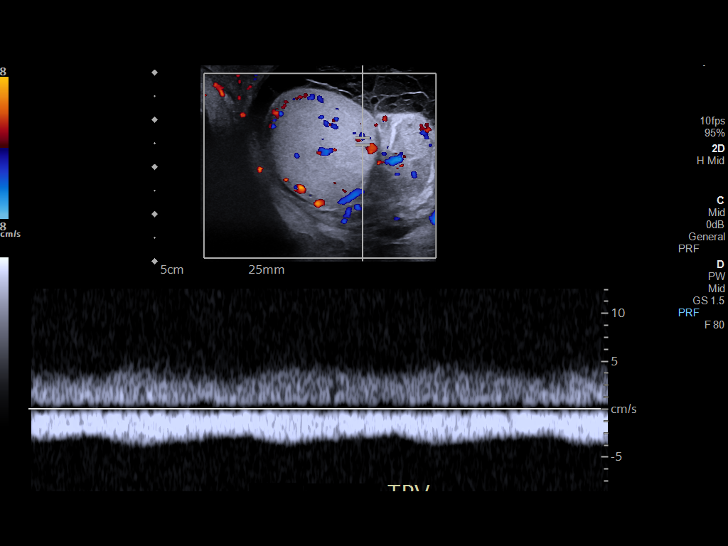
[im 44/59]
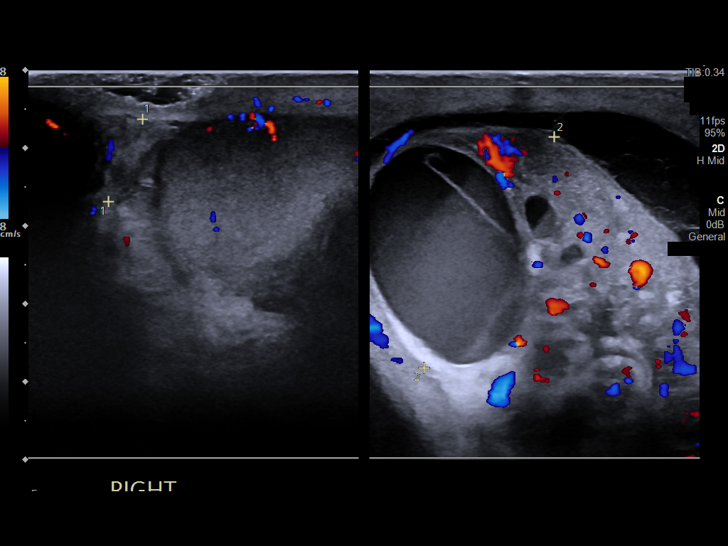
[im 49/59]
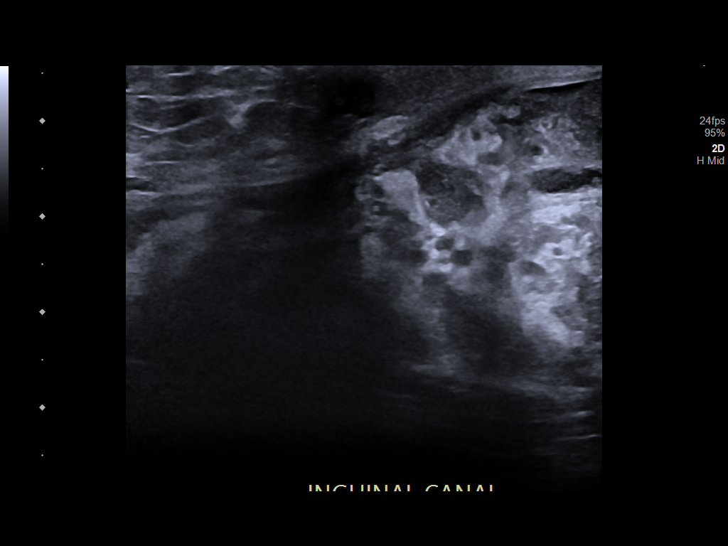
[im 54/59]
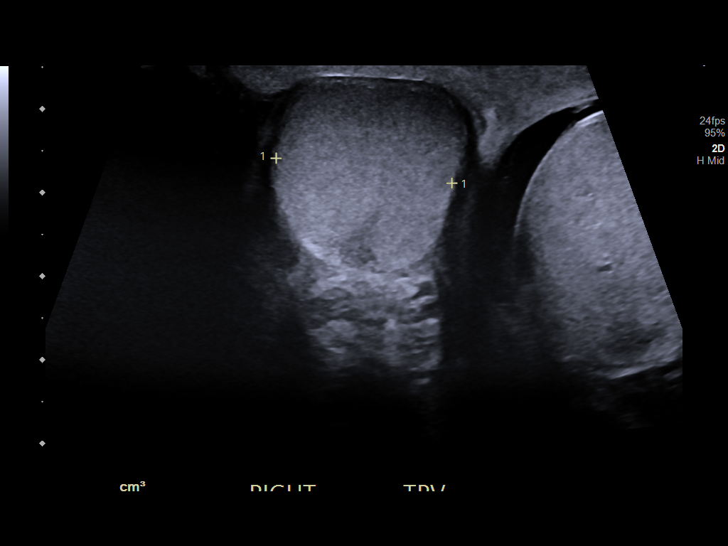
[im 59/59]
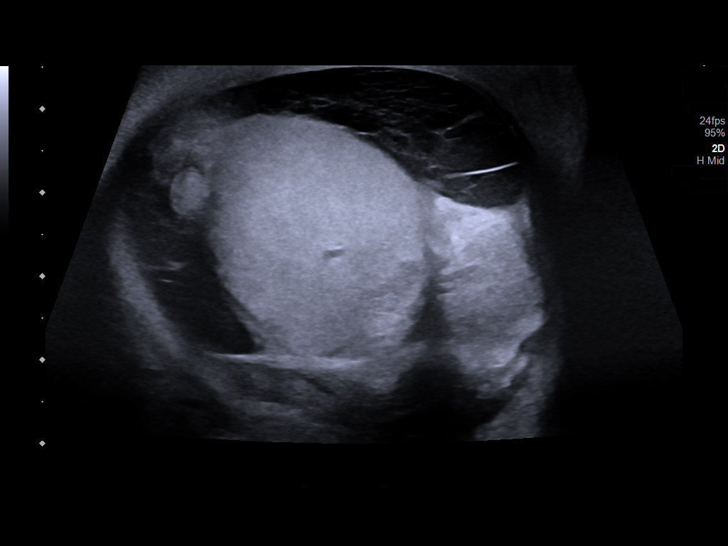

[13 of 25 positions shown; findings below may reference images not displayed]

FINDINGS: Right testicle

Measurements: 0.0 x 2.5 x 2.1 cm. No mass or microlithiasis
visualized.

Left testicle

Measurements: 3.9 x 2.7 x 2.2 cm. No mass or microlithiasis
visualized.

Right epididymis:  Normal in size and appearance.

Left epididymis: Complicated prominent cyst enlarges the epididymal
head, smaller cyst along the epididymal body/tail. Largest cyst
measures 3 cm in greatest dimension. Relative increased vascularity
of the right epididymis.

Hydrocele: Complex right hydrocele with multiple internal
septations.

Varicocele:  None visualized.

Pulsed Doppler interrogation of both testes demonstrates normal low
resistance arterial and venous waveforms bilaterally.
IMPRESSION: 1. Findings consistent with right-sided epididymitis. Enlarged right
epididymis, predominantly the epididymal head, with epididymal
cysts, largest a complicated cyst measuring 3 cm arising from the
head. Increased right epididymal blood flow and complex right
hydrocele.
2. No testicular mass or torsion.

## 2022-12-03 NOTE — Progress Notes (Signed)
[facility] [address] [place] [address]    Clinic Day:  12/04/22   Referring physician: Elfredia Nevins, MD  Patient Care Team: Elfredia Nevins, MD as PCP - General (Internal Medicine) Rollene Rotunda, MD as PCP - Cardiology (Cardiology) Doreatha Massed, MD as Medical Oncologist (Hematology)   ASSESSMENT & PLAN:   Assessment: 1.  MDS with EB 2: - Presentation with severe neutropenia, mild thrombocytopenia and anemia - BMBX (08/17/2022): MDS with excess blasts.  Myeloblasts 17% and monocytes associated dyspoietic changes. - Cytogenetics: 46, XY[10].  AML FISH panel normal. - CEBPA negative.  FLT3 ITD/TKD negative.  NPM1 negative.  T p53 negative. - IDH1 negative.  IDH 2 mutation (p.[id number]) positive. - IPSS-R: Score 5.5, high risk - Serum EPO: 186 - NGS: IDH2 mutation present - Cycle 1 of azacitidine 75 mg/m days 1-7 on 10/23/2022.   2.  Social/family history: - He is independent of ADLs and IADLs.  He served in [employer] in the 1970s.  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  Current active smoker, 1 pack/day for 50 years.  [name]. - No family history of leukopenia or malignancies.    Plan: 1.  MDS with EB 2: - He received cycle 1 on 10/23/2022. - He was hospitalized twice, initially for diarrhea, generalized weakness and UTI.  Second time he was weak and had diarrhea and was positive for norovirus.  He went to rehab at [facility] on 11/29/2022.  I have reviewed hospitalization records. - He reports that he is having about 2-3 bowel movements, soft watery per day. - He reports that he is not happy with the care he is being given at the current facility.  He requests multiple times to be transferred from that facility. - I have written the request from the patient in the notes to the nursing home. - We reviewed his labs: White count improved to 2.5 with ANC of 1.4.  Platelet count  normalized at 185.  Hemoglobin is 8.8.  He had significant improvement in blood counts after 1 cycle.  However because of his poor functional status, I did not recommend continuing azacitidine at this time. - His albumin is low at 1.8.  Recommend boost/Ensure high-protein twice daily. - Recommend RTC 3 weeks for follow-up with repeat labs to see if he needs any transfusion.    Orders Placed This Encounter  Procedures   CBC with Differential/Platelet    Standing Status:   Future    Standing Expiration Date:   12/04/2023    Order Specific Question:   Release to patient    Answer:   Immediate   Comprehensive metabolic panel    Standing Status:   Future    Standing Expiration Date:   12/04/2023    Order Specific Question:   Release to patient    Answer:   Immediate   Magnesium    Standing Status:   Future    Standing Expiration Date:   12/04/2023    Order Specific Question:   Release to patient    Answer:   Immediate   Ferritin    Standing Status:   Future    Standing Expiration Date:   12/04/2023    Order Specific Question:   Release to patient    Answer:   Immediate   Iron and TIBC    Standing Status:   Future    Standing Expiration Date:   12/04/2023  Order Specific Question:   Release to patient    Answer:   Immediate   Ambulatory referral to Social Work    Referral Priority:   Routine    Referral Type:   Consultation    Referral Reason:   Specialty Services Required    Referred to Provider:   Doreatha Massed, MD    Number of Visits Requested:   1       I,Helena R Teague,acting as a scribe for Doreatha Massed, MD.,have documented all relevant documentation on the behalf of Doreatha Massed, MD,as directed by  Doreatha Massed, MD while in the presence of Doreatha Massed, MD.   I, Doreatha Massed MD, have reviewed the above documentation for accuracy and completeness, and I agree with the above.    Doreatha Massed, MD   7/15/20244:38  PM  CHIEF COMPLAINT:   Diagnosis: MDS   Cancer Staging  No matching staging information was found for the patient.    Prior Therapy: none  Current Therapy:  azacitadine   HISTORY OF PRESENT ILLNESS:   Oncology History  [name]  09/06/2022 Initial Diagnosis   MDS (myelodysplastic syndrome)   10/23/2022 -  Chemotherapy   Patient is on Treatment Plan : MYELODYSPLASIA  Azacitidine IV D1-7 q28d        INTERVAL HISTORY:   John Hicks is a 72 y.o. male presenting to clinic today for follow up of MDS. He was last seen by me on 10/02/22.  Since his last visit, he was admitted to the ED on 7/1 for generalized weakness, diarrhea, stomach pain, and right foot pain. X rays of the right foot noted degenerative changes and venous US was negative for DVT. GI panel was positive for norovirus and he was given imodium. He was discharged on 7/10.  He was admitted to the ED on 6/17 for E. coli UTI POA and was started on IV ceftriaxone. CT showed prostatic mass with affect to the base of the bladder. He also had worsening anemia with recent chemotherapy initiation for MDS and required 2 unit PRBC transfusion. He was discharges on 6/20  He underwent port placement on 10/12/22.  His appetite level is at 0%. His energy level is at 0%.  He complains that his last treatment of chemotherapy made him extremely fatigued and unable to walk. He notes he has lost his voice. He notes he is given Booster ensure when they have it at his nursing facility, though it is not often.   He c/o diarrhea 2-3x a day with soft and watery consistency. He reports that it has been this way since moving to his current nursing facility, [facility]. He is unhappy at the nursing facility he currently resides in and wishes to be moved somewhere else.   PAST MEDICAL HISTORY:   Past Medical History: Past Medical History:  Diagnosis Date   AAA (abdominal aortic aneurysm) (HCC)    AF (atrial  fibrillation) (HCC)    Anxiety    Blood transfusion without reported diagnosis    BPH (benign prostatic hyperplasia)    CHF (congestive heart failure) (HCC)    Chronic kidney disease    stage 3   Depression    Dyspnea    Dysrhythmia    Hypertension    Hypothyroid    Leukemia (HCC)    Myocardial infarction [facility])     Surgical History: Past Surgical History:  Procedure Laterality Date   ABDOMINAL AORTIC ENDOVASCULAR STENT GRAFT N/A 02/03/2019   Procedure: ABDOMINAL AORTIC  ENDOVASCULAR STENT GRAFT;  Surgeon: Maeola Harman, MD;  Location: [facility] OR;  Service: Vascular;  Laterality: N/A;   APPENDECTOMY     KNEE SURGERY     PORTACATH PLACEMENT Right 10/12/2022   Procedure: INSERTION PORT-A-CATH;  Surgeon: Lucretia Roers, MD;  Location: [facility] ORS;  Service: General;  Laterality: Right;   ULTRASOUND GUIDANCE FOR VASCULAR ACCESS  02/03/2019   Procedure: Ultrasound Guidance For Vascular Access;  Surgeon: Maeola Harman, MD;  Location: [facility] OR;  Service: Vascular;;    Social History: Social History   Socioeconomic History   Marital status: Single    Spouse name: Not on file   Number of children: Not on file   Years of education: Not on file   Highest education level: Not on file  Occupational History   Occupation: Retired  Tobacco Use   Smoking status: Some Days    Current packs/day: 1.00    Types: Cigarettes    Passive exposure: Current   Smokeless tobacco: Never   Tobacco comments:    ppd  Vaping Use   Vaping status: Never Used  Substance and Sexual Activity   Alcohol use: No   Drug use: Not Currently    Types: Marijuana   Sexual activity: Not Currently  Other Topics Concern   Not on file  Social History Narrative   Pt lives w/ landlord and [place].   Social Determinants of Health   Financial Resource Strain: Not on file  Food Insecurity: No Food Insecurity (11/20/2022)   Hunger Vital Sign    Worried About Running Out of Food in the Last Year:  Never true    Ran Out of Food in the Last Year: Never true  Transportation Needs: No Transportation Needs (11/20/2022)   PRAPARE - Administrator, Civil Service (Medical): No    Lack of Transportation (Non-Medical): No  Physical Activity: Not on file  Stress: Not on file  Social Connections: Not on file  Intimate Partner Violence: Not At Risk (11/20/2022)   Humiliation, Afraid, Rape, and Kick questionnaire    Fear of Current or Ex-Partner: No    Emotionally Abused: No    Physically Abused: No    Sexually Abused: No    Family History: Family History  Problem Relation Age of Onset   Stroke Father 25       went in the bathroom and collapsed   Multiple sclerosis Sister    Psychiatric Illness Mother 24    Current Medications:  Current Outpatient Medications:    acetaminophen (TYLENOL) 325 MG tablet, Take 2 tablets (650 mg total) by mouth every 6 (six) hours as needed for mild pain (or Fever >/= 101)., Disp: , Rfl:    ALPRAZolam (XANAX) 0.5 MG tablet, Take 1 tablet (0.5 mg total) by mouth 2 (two) times daily as needed for anxiety., Disp: 14 tablet, Rfl: 0   amiodarone (PACERONE) 200 MG tablet, Take 200 mg by mouth daily at 2 PM., Disp: , Rfl: 0   aspirin 81 MG chewable tablet, Chew 81 mg by mouth daily at 2 PM., Disp: , Rfl:    carvedilol (COREG) 3.125 MG tablet, Take 1 tablet (3.125 mg total) by mouth 2 (two) times daily., Disp: , Rfl:    Cholecalciferol (VITAMIN D3) 50 MCG (2000 UT) capsule, Take 2,000 Units by mouth daily at 2 PM. 1400, Disp: , Rfl:    feeding supplement (ENSURE ENLIVE / ENSURE PLUS) LIQD, Take 237 mLs by mouth 2 (two) times daily between  meals., Disp: 237 mL, Rfl: 12   folic acid (FOLVITE) 1 MG tablet, Take 1 mg by mouth daily at 2 PM. One daily, Disp: , Rfl:    levothyroxine (SYNTHROID) 25 MCG tablet, Take 25 mcg by mouth daily at 2 PM., Disp: , Rfl:    loperamide (IMODIUM) 2 MG capsule, Take 1 capsule (2 mg total) by mouth every 6 (six) hours as needed  for diarrhea or loose stools., Disp: 30 capsule, Rfl: 0   Nutritional Supplements (,FEEDING SUPPLEMENT, PROSOURCE PLUS) liquid, Take 60 mLs by mouth 3 (three) times daily between meals., Disp: , Rfl:    ondansetron (ZOFRAN) 4 MG tablet, Take 1 tablet (4 mg total) by mouth every 8 (eight) hours as needed for vomiting or nausea., Disp: , Rfl:    tamsulosin (FLOMAX) 0.4 MG CAPS capsule, Take 0.4 mg by mouth daily at 2 PM., Disp: , Rfl:    Allergies: Allergies  Allergen Reactions   Codeine Other (See Comments)    agitation    REVIEW OF SYSTEMS:   Review of Systems  Constitutional:  Positive for fatigue. Negative for chills and fever.  HENT:   Negative for lump/mass, mouth sores, nosebleeds, sore throat and trouble swallowing.   Eyes:  Negative for eye problems.  Respiratory:  Negative for cough and shortness of breath.   Cardiovascular:  Negative for chest pain, leg swelling and palpitations.  Gastrointestinal:  Positive for diarrhea. Negative for abdominal pain, constipation, nausea and vomiting.  Genitourinary:  Positive for dysuria. Negative for bladder incontinence, difficulty urinating, frequency, hematuria and nocturia.   Musculoskeletal:  Negative for arthralgias, back pain, flank pain, myalgias and neck pain.  Skin:  Negative for itching and rash.  Neurological:  Positive for dizziness and numbness (in feet). Negative for headaches.  Hematological:  Does not bruise/bleed easily.  Psychiatric/Behavioral:  Positive for depression and sleep disturbance. Negative for suicidal ideas. The patient is nervous/anxious.   All other systems reviewed and are negative.    VITALS:   Blood pressure 98/76, pulse 70, resp. rate 18, SpO2 100%.  Wt Readings from Last 3 Encounters:  11/20/22 180 lb (81.6 kg)  11/06/22 185 lb (83.9 kg)  10/30/22 186 lb 3.2 oz (84.5 kg)    There is no height or weight on file to calculate BMI.  Performance status (ECOG): 1 - Symptomatic but completely  ambulatory  PHYSICAL EXAM:   Physical Exam Vitals and nursing note reviewed. Exam conducted with a chaperone present.  Constitutional:      Appearance: Normal appearance.  Cardiovascular:     Rate and Rhythm: Normal rate and regular rhythm.     Pulses: Normal pulses.     Heart sounds: Normal heart sounds.  Pulmonary:     Effort: Pulmonary effort is normal.     Breath sounds: Normal breath sounds.  Abdominal:     Palpations: Abdomen is soft. There is no hepatomegaly, splenomegaly or mass.     Tenderness: There is no abdominal tenderness.  Musculoskeletal:     Right lower leg: Edema present.     Left lower leg: Edema present.  Lymphadenopathy:     Cervical: No cervical adenopathy.     Right cervical: No superficial, deep or posterior cervical adenopathy.    Left cervical: No superficial, deep or posterior cervical adenopathy.     Upper Body:     Right upper body: No supraclavicular or axillary adenopathy.     Left upper body: No supraclavicular or axillary adenopathy.  Neurological:  General: No focal deficit present.     Mental Status: He is alert and oriented to person, place, and time.  Psychiatric:        Mood and Affect: Mood normal.        Behavior: Behavior normal.     LABS:      Latest Ref Rng & Units 12/04/2022    1:21 PM 11/27/2022    4:22 AM 11/25/2022    4:46 AM  CBC  WBC 4.0 - 10.5 K/uL 2.5  2.6  3.1   Hemoglobin 13.0 - 17.0 g/dL 8.8  7.3  7.3   Hematocrit 39.0 - 52.0 % 27.0  23.1  23.5   Platelets 150 - 400 K/uL 185  105  93       Latest Ref Rng & Units 12/04/2022    1:21 PM 11/23/2022    4:15 AM 11/22/2022    4:24 AM  CMP  Glucose 70 - 99 mg/dL 161  096  045   BUN 8 - 23 mg/dL 18  37  40   Creatinine 0.61 - 1.24 mg/dL 4.09  8.11  9.14   Sodium 135 - 145 mmol/L 133  137  137   Potassium 3.5 - 5.1 mmol/L 3.4  3.4  3.5   Chloride 98 - 111 mmol/L 104  114  114   CO2 22 - 32 mmol/L 22  18  17    Calcium 8.9 - 10.3 mg/dL 7.8  7.8  7.7   Total Protein 6.5  - 8.1 g/dL 5.8     Total Bilirubin 0.3 - 1.2 mg/dL 1.5     Alkaline Phos 38 - 126 U/L 53     AST 15 - 41 U/L 17     ALT 0 - 44 U/L 14        No results found for: "CEA1", "CEA" / No results found for: "CEA1", "CEA" No results found for: "PSA1" No results found for: "NWG956" No results found for: "CAN125"  No results found for: "TOTALPROTELP", "ALBUMINELP", "A1GS", "A2GS", "BETS", "BETA2SER", "GAMS", "MSPIKE", "SPEI" Lab Results  Component Value Date   TIBC 339 06/30/2022   FERRITIN 108 06/30/2022   IRONPCTSAT 17 (L) 06/30/2022   Lab Results  Component Value Date   LDH 145 06/30/2022     STUDIES:   DG CHEST PORT 1 VIEW  Result Date: 11/25/2022 CLINICAL DATA:  Fever EXAM: PORTABLE CHEST 1 VIEW COMPARISON:  11/06/2022 FINDINGS: Right-sided chest port remains in place. Stable mild cardiomegaly. Aortic atherosclerosis. Coarsened interstitial markings bilaterally. No lobar consolidation. No pleural effusion or pneumothorax. IMPRESSION: Coarsened interstitial markings bilaterally, which may reflect bronchitic type lung changes. No lobar consolidation. Electronically Signed   By: Duanne Guess D.O.   On: 11/25/2022 12:47   US Venous Img Lower Unilateral Right (DVT)  Result Date: 11/21/2022 CLINICAL DATA:  72 year old male with a history of foot pain EXAM: RIGHT LOWER EXTREMITY VENOUS DOPPLER ULTRASOUND TECHNIQUE: Gray-scale sonography with graded compression, as well as color Doppler and duplex ultrasound were performed to evaluate the lower extremity deep venous systems from the level of the common femoral vein and including the common femoral, femoral, profunda femoral, popliteal and calf veins including the posterior tibial, peroneal and gastrocnemius veins when visible. The superficial great saphenous vein was also interrogated. Spectral Doppler was utilized to evaluate flow at rest and with distal augmentation maneuvers in the common femoral, femoral and popliteal veins. COMPARISON:   None Available. FINDINGS: Contralateral Common Femoral Vein: Respiratory phasicity is normal and  symmetric with the symptomatic side. No evidence of thrombus. Normal compressibility. Common Femoral Vein: No evidence of thrombus. Normal compressibility, respiratory phasicity and response to augmentation. Saphenofemoral Junction: No evidence of thrombus. Normal compressibility and flow on color Doppler imaging. Profunda Femoral Vein: No evidence of thrombus. Normal compressibility and flow on color Doppler imaging. Femoral Vein: No evidence of thrombus. Normal compressibility, respiratory phasicity and response to augmentation. Popliteal Vein: No evidence of thrombus. Normal compressibility, respiratory phasicity and response to augmentation. Calf Veins: No evidence of thrombus. Normal compressibility and flow on color Doppler imaging. Superficial Great Saphenous Vein: No evidence of thrombus. Normal compressibility and flow on color Doppler imaging. Other Findings:  None. IMPRESSION: Directed duplex of the right lower extremity negative for DVT Signed, Yvone Neu. Miachel Roux, RPVI [facility] Electronically Signed   By: Gilmer Mor D.O.   On: 11/21/2022 09:04   DG Foot 2 Views Right  Result Date: 11/21/2022 CLINICAL DATA:  Right foot pain and swelling, initial encounter EXAM: RIGHT FOOT - 2 VIEW COMPARISON:  None Available. FINDINGS: Mild calcaneal spurring is seen. Degenerative changes of the tibiotalar joint and tarsal bones are seen. Mild soft tissue swelling is noted about the metatarsals. No acute fracture or dislocation is seen. IMPRESSION: Mild degenerative change without acute bony abnormality. Electronically Signed   By: Alcide Clever M.D.   On: 11/21/2022 03:52   DG Chest Portable 1 View  Result Date: 11/06/2022 CLINICAL DATA:  Cough. EXAM: PORTABLE CHEST 1 VIEW COMPARISON:  Chest radiograph dated 10/12/2022. FINDINGS: Right-sided  Port-A-Cath in similar position. No focal consolidation, pleural effusion, or pneumothorax. Stable cardiomegaly. Atherosclerotic calcification of the aortic arch. No acute osseous pathology. IMPRESSION: 1. No active disease. 2. Cardiomegaly. Electronically Signed   By: Elgie Collard M.D.   On: 11/06/2022 19:34   CT ABDOMEN PELVIS WO CONTRAST  Result Date: 11/06/2022 CLINICAL DATA:  Diarrhea, lower abdominal pain. Hypotension with sepsis. Patient has leukemia. EXAM: CT ABDOMEN AND PELVIS WITHOUT CONTRAST TECHNIQUE: Multidetector CT imaging of the abdomen and pelvis was performed following the standard protocol without IV contrast. RADIATION DOSE REDUCTION: This exam was performed according to the departmental dose-optimization program which includes automated exposure control, adjustment of the mA and/or kV according to patient size and/or use of iterative reconstruction technique. COMPARISON:  CT 08/23/2022 without contrast FINDINGS: Lower chest: Heart is enlarged. Coronary artery calcifications are seen. There is some linear opacity at the lung bases likely scar or atelectasis. Tiny left effusion. Mildly patulous esophagus. Hepatobiliary: Stones in the gallbladder. On this non IV contrast exam, the liver parenchyma is grossly preserved. Pancreas: Mild pancreatic atrophy. Spleen: Spleen is enlarged with cephalocaudal length of 13.9 cm. Spleen is slightly heterogeneous. Adrenals/Urinary Tract: Slight nonspecific nodular thickening of the adrenal glands. Focal area on the right side measures 12 mm on series 3, image 22 measuring Hounsfield unit of 4, a benign adenoma. Numerous bilateral small renal cysts are identified again Bosniak 1 and 2 lesions. No specific imaging follow-up. No obstructing renal or ureteral stones. Slight ectasia of the renal collecting systems and ureters down to the bladder. The bladder wall is diffusely slightly thickened with some trabeculation and there is an enlarged prostate.  Stomach/Bowel: No oral contrast. Stomach is underdistended. Small bowel is nondilated. Slight wall thickening along the sigmoid colon but the bowel is underdistended. Slight adjacent stranding. Please correlate for any clinical evidence of colitis. No proximal obstruction. Appendix is poorly seen in the right lower quadrant  but no pericecal stranding or fluid. Vascular/Lymphatic: Aortic endograft in place, incompletely evaluated on this noncontrast examination. Diameter of the aneurysm previously was measured at 6.5 cm and today when measured in the same fashion as the prior measures 6.5 cm. Dedicated workup as clinically appropriate. Reproductive: Enlarged prostate with mass effect along the base of the bladder. Other: No free intra-air.  Small fat containing inguinal hernias. Musculoskeletal: Curvature of the spine with degenerative changes. Multilevel stenosis. IMPRESSION: Subtle wall thickening along the sigmoid colon with stranding. A subtle colitis is possible. No obstruction or free air. No free fluid. Ectatic renal collecting systems dome of the bladder. The bladder wall is thickened and trabeculated and there is a large prostate with mass effect along the base of the bladder. No obstructing stones. Enlarged heart. Gallstones.  Gallbladder is nondilated. Splenomegaly. Aortic endograft with the abdominal aortic aneurysm 6.5 cm, similar to previous. Overall the endograft in the aneurysm is incompletely evaluated on this noncontrast exam. If needed dedicated endograft protocol postcontrast study could be considered if needed clinically Electronically Signed   By: Karen Kays M.D.   On: 11/06/2022 19:31

## 2022-12-04 ENCOUNTER — Inpatient Hospital Stay (HOSPITAL_BASED_OUTPATIENT_CLINIC_OR_DEPARTMENT_OTHER): Payer: PPO | Admitting: Hematology

## 2022-12-04 ENCOUNTER — Inpatient Hospital Stay: Payer: PPO

## 2022-12-04 ENCOUNTER — Encounter: Payer: Self-pay | Admitting: Hematology

## 2022-12-04 ENCOUNTER — Inpatient Hospital Stay: Payer: PPO | Attending: Hematology

## 2022-12-04 VITALS — BP 98/76 | HR 70 | Resp 18

## 2022-12-04 DIAGNOSIS — Z7982 Long term (current) use of aspirin: Secondary | ICD-10-CM | POA: Diagnosis not present

## 2022-12-04 DIAGNOSIS — D4622 Refractory anemia with excess of blasts 2: Secondary | ICD-10-CM | POA: Insufficient documentation

## 2022-12-04 DIAGNOSIS — D696 Thrombocytopenia, unspecified: Secondary | ICD-10-CM

## 2022-12-04 DIAGNOSIS — R531 Weakness: Secondary | ICD-10-CM | POA: Diagnosis not present

## 2022-12-04 DIAGNOSIS — D469 Myelodysplastic syndrome, unspecified: Secondary | ICD-10-CM | POA: Diagnosis not present

## 2022-12-04 DIAGNOSIS — Z79899 Other long term (current) drug therapy: Secondary | ICD-10-CM | POA: Insufficient documentation

## 2022-12-04 DIAGNOSIS — D649 Anemia, unspecified: Secondary | ICD-10-CM | POA: Diagnosis not present

## 2022-12-04 DIAGNOSIS — F17210 Nicotine dependence, cigarettes, uncomplicated: Secondary | ICD-10-CM | POA: Insufficient documentation

## 2022-12-04 DIAGNOSIS — I10 Essential (primary) hypertension: Secondary | ICD-10-CM | POA: Diagnosis not present

## 2022-12-04 DIAGNOSIS — E860 Dehydration: Secondary | ICD-10-CM | POA: Diagnosis not present

## 2022-12-04 DIAGNOSIS — Z95828 Presence of other vascular implants and grafts: Secondary | ICD-10-CM

## 2022-12-04 DIAGNOSIS — D72819 Decreased white blood cell count, unspecified: Secondary | ICD-10-CM

## 2022-12-04 DIAGNOSIS — W19XXXA Unspecified fall, initial encounter: Secondary | ICD-10-CM | POA: Diagnosis not present

## 2022-12-04 LAB — CBC WITH DIFFERENTIAL/PLATELET
Abs Immature Granulocytes: 0.03 10*3/uL (ref 0.00–0.07)
Basophils Absolute: 0 10*3/uL (ref 0.0–0.1)
Basophils Relative: 0 %
Eosinophils Absolute: 0 10*3/uL (ref 0.0–0.5)
Eosinophils Relative: 1 %
HCT: 27 % — ABNORMAL LOW (ref 39.0–52.0)
Hemoglobin: 8.8 g/dL — ABNORMAL LOW (ref 13.0–17.0)
Immature Granulocytes: 1 %
Lymphocytes Relative: 24 %
Lymphs Abs: 0.6 10*3/uL — ABNORMAL LOW (ref 0.7–4.0)
MCH: 32.6 pg (ref 26.0–34.0)
MCHC: 32.6 g/dL (ref 30.0–36.0)
MCV: 100 fL (ref 80.0–100.0)
Monocytes Absolute: 0.5 10*3/uL (ref 0.1–1.0)
Monocytes Relative: 19 %
Neutro Abs: 1.4 10*3/uL — ABNORMAL LOW (ref 1.7–7.7)
Neutrophils Relative %: 55 %
Platelets: 185 10*3/uL (ref 150–400)
RBC: 2.7 MIL/uL — ABNORMAL LOW (ref 4.22–5.81)
RDW: 18.4 % — ABNORMAL HIGH (ref 11.5–15.5)
WBC: 2.5 10*3/uL — ABNORMAL LOW (ref 4.0–10.5)
nRBC: 0 % (ref 0.0–0.2)

## 2022-12-04 LAB — COMPREHENSIVE METABOLIC PANEL
ALT: 14 U/L (ref 0–44)
AST: 17 U/L (ref 15–41)
Albumin: 1.8 g/dL — ABNORMAL LOW (ref 3.5–5.0)
Alkaline Phosphatase: 53 U/L (ref 38–126)
Anion gap: 7 (ref 5–15)
BUN: 18 mg/dL (ref 8–23)
CO2: 22 mmol/L (ref 22–32)
Calcium: 7.8 mg/dL — ABNORMAL LOW (ref 8.9–10.3)
Chloride: 104 mmol/L (ref 98–111)
Creatinine, Ser: 1.2 mg/dL (ref 0.61–1.24)
GFR, Estimated: >60 mL/min (ref >60)
Glucose, Bld: 123 mg/dL — ABNORMAL HIGH (ref 70–99)
Potassium: 3.4 mmol/L — ABNORMAL LOW (ref 3.5–5.1)
Sodium: 133 mmol/L — ABNORMAL LOW (ref 135–145)
Total Bilirubin: 1.5 mg/dL — ABNORMAL HIGH (ref 0.3–1.2)
Total Protein: 5.8 g/dL — ABNORMAL LOW (ref 6.5–8.1)

## 2022-12-04 LAB — SAMPLE TO BLOOD BANK

## 2022-12-04 LAB — MAGNESIUM: Magnesium: 2.1 mg/dL (ref 1.7–2.4)

## 2022-12-04 MED ORDER — SODIUM CHLORIDE 0.9% FLUSH
10.0000 mL | INTRAVENOUS | Status: DC | PRN
  Administered 2022-12-04: 10 mL via INTRAVENOUS

## 2022-12-04 MED ORDER — HEPARIN SOD (PORK) LOCK FLUSH 100 UNIT/ML IV SOLN
500.0000 [IU] | Freq: Once | INTRAVENOUS | Status: AC
  Administered 2022-12-04: 500 [IU] via INTRAVENOUS

## 2022-12-04 NOTE — Patient Instructions (Signed)
[facility]  Discharge Instructions  You were seen and examined today by Dr. Ellin Saba.  Dr. Ellin Saba discussed your most recent lab work which revealed that your albumin is low.  Dr. Ellin Saba wants you to be drinking ensure to help this.  Follow-up as scheduled in 3 weeks.    Thank you for choosing [facility] to provide your oncology and hematology care.   To afford each patient quality time with our provider, please arrive at least 15 minutes before your scheduled appointment time. You may need to reschedule your appointment if you arrive late (10 or more minutes). Arriving late affects you and other patients whose appointments are after yours.  Also, if you miss three or more appointments without notifying the office, you may be dismissed from the clinic at the provider's discretion.    Again, thank you for choosing [facility].  Our hope is that these requests will decrease the amount of time that you wait before being seen by our physicians.   If you have a lab appointment with the Cancer Center - please note that after April 8th, all labs will be drawn in the cancer center.  You do not have to check in or register with the main entrance as you have in the past but will complete your check-in at the cancer center.            _____________________________________________________________  Should you have questions after your visit to [facility], please contact our office at [phone] and follow the prompts.  Our office hours are 8:00 a.m. to 4:30 p.m. Monday - Thursday and 8:00 a.m. to 2:30 p.m. Friday.  Please note that voicemails left after 4:00 p.m. may not be returned until the following business day.  We are closed weekends and all major holidays.  You do have access to a nurse 24-7, just call the main number to the clinic [phone] and do not press any options, hold on the line and a nurse  will answer the phone.    For prescription refill requests, have your pharmacy contact our office and allow 72 hours.    Masks are no longer required in the cancer centers. If you would like for your care team to wear a mask while they are taking care of you, please let them know. You may have one support person who is at least 70 years old accompany you for your appointments.

## 2022-12-04 NOTE — Progress Notes (Signed)
 Patients port flushed without difficulty.  Good blood return noted with no bruising or swelling noted at site.  Band aid applied.  VSS with discharge and left in satisfactory condition with no s/s of distress noted.   

## 2022-12-05 ENCOUNTER — Inpatient Hospital Stay: Payer: PPO

## 2022-12-06 DIAGNOSIS — N400 Enlarged prostate without lower urinary tract symptoms: Secondary | ICD-10-CM | POA: Diagnosis not present

## 2022-12-06 DIAGNOSIS — W19XXXA Unspecified fall, initial encounter: Secondary | ICD-10-CM | POA: Diagnosis not present

## 2022-12-06 DIAGNOSIS — R239 Unspecified skin changes: Secondary | ICD-10-CM | POA: Diagnosis not present

## 2022-12-06 DIAGNOSIS — L89893 Pressure ulcer of other site, stage 3: Secondary | ICD-10-CM | POA: Diagnosis not present

## 2022-12-06 DIAGNOSIS — D649 Anemia, unspecified: Secondary | ICD-10-CM | POA: Diagnosis not present

## 2022-12-06 DIAGNOSIS — I10 Essential (primary) hypertension: Secondary | ICD-10-CM | POA: Diagnosis not present

## 2022-12-06 DIAGNOSIS — N3946 Mixed incontinence: Secondary | ICD-10-CM | POA: Diagnosis not present

## 2022-12-06 DIAGNOSIS — R531 Weakness: Secondary | ICD-10-CM | POA: Diagnosis not present

## 2022-12-06 DIAGNOSIS — F419 Anxiety disorder, unspecified: Secondary | ICD-10-CM | POA: Diagnosis not present

## 2022-12-06 DIAGNOSIS — D508 Other iron deficiency anemias: Secondary | ICD-10-CM | POA: Diagnosis not present

## 2022-12-06 DIAGNOSIS — M6281 Muscle weakness (generalized): Secondary | ICD-10-CM | POA: Diagnosis not present

## 2022-12-06 DIAGNOSIS — E860 Dehydration: Secondary | ICD-10-CM | POA: Diagnosis not present

## 2022-12-07 ENCOUNTER — Inpatient Hospital Stay: Payer: PPO | Admitting: Licensed Clinical Social Worker

## 2022-12-07 DIAGNOSIS — D469 Myelodysplastic syndrome, unspecified: Secondary | ICD-10-CM

## 2022-12-07 NOTE — Progress Notes (Signed)
[facility] CSW Progress Note  Clinical Child psychotherapist contacted patient by phone to discuss concerns regarding SNF placement.  Pt states he is unhappy with the SNF he was discharged to from the hospital and would like to be transferred to another facility.  CSW explained to pt he will need to find another facility he would like to be transferred to and then see if they have beds available and accept his insurance.  If they do he can request a transfer to their facility and the new facility will work with his current facility to arrange for transfer.  Pt has a cousin locally.  CSW suggested pt enlist the assistance of his cousin to co-ordinate transfer if an available bed can be located.      Rachel Moulds, LCSW Clinical Social Worker [facility]

## 2022-12-08 DIAGNOSIS — E860 Dehydration: Secondary | ICD-10-CM | POA: Diagnosis not present

## 2022-12-08 DIAGNOSIS — R531 Weakness: Secondary | ICD-10-CM | POA: Diagnosis not present

## 2022-12-08 DIAGNOSIS — I10 Essential (primary) hypertension: Secondary | ICD-10-CM | POA: Diagnosis not present

## 2022-12-08 DIAGNOSIS — D649 Anemia, unspecified: Secondary | ICD-10-CM | POA: Diagnosis not present

## 2022-12-11 ENCOUNTER — Other Ambulatory Visit: Payer: Self-pay

## 2022-12-11 DIAGNOSIS — E860 Dehydration: Secondary | ICD-10-CM | POA: Diagnosis not present

## 2022-12-11 DIAGNOSIS — I10 Essential (primary) hypertension: Secondary | ICD-10-CM | POA: Diagnosis not present

## 2022-12-11 DIAGNOSIS — R531 Weakness: Secondary | ICD-10-CM | POA: Diagnosis not present

## 2022-12-11 DIAGNOSIS — D649 Anemia, unspecified: Secondary | ICD-10-CM | POA: Diagnosis not present

## 2022-12-13 ENCOUNTER — Encounter: Payer: Self-pay | Admitting: *Deleted

## 2022-12-13 DIAGNOSIS — R239 Unspecified skin changes: Secondary | ICD-10-CM | POA: Diagnosis not present

## 2022-12-13 DIAGNOSIS — N3946 Mixed incontinence: Secondary | ICD-10-CM | POA: Diagnosis not present

## 2022-12-13 DIAGNOSIS — M6281 Muscle weakness (generalized): Secondary | ICD-10-CM | POA: Diagnosis not present

## 2022-12-13 DIAGNOSIS — F419 Anxiety disorder, unspecified: Secondary | ICD-10-CM | POA: Diagnosis not present

## 2022-12-13 DIAGNOSIS — R531 Weakness: Secondary | ICD-10-CM | POA: Diagnosis not present

## 2022-12-13 DIAGNOSIS — D649 Anemia, unspecified: Secondary | ICD-10-CM | POA: Diagnosis not present

## 2022-12-13 DIAGNOSIS — L89893 Pressure ulcer of other site, stage 3: Secondary | ICD-10-CM | POA: Diagnosis not present

## 2022-12-13 DIAGNOSIS — D508 Other iron deficiency anemias: Secondary | ICD-10-CM | POA: Diagnosis not present

## 2022-12-13 DIAGNOSIS — I10 Essential (primary) hypertension: Secondary | ICD-10-CM | POA: Diagnosis not present

## 2022-12-13 DIAGNOSIS — E860 Dehydration: Secondary | ICD-10-CM | POA: Diagnosis not present

## 2022-12-13 DIAGNOSIS — N400 Enlarged prostate without lower urinary tract symptoms: Secondary | ICD-10-CM | POA: Diagnosis not present

## 2022-12-13 NOTE — Progress Notes (Signed)
Received lab results drawn on 7/23 @ [place].  Hgb 7.3/ PLT 172.  Per Dr. Ellin Saba, will schedule for 1 unit PRBC to be done on Friday 7/26.

## 2022-12-14 ENCOUNTER — Telehealth: Payer: Self-pay | Admitting: *Deleted

## 2022-12-14 ENCOUNTER — Other Ambulatory Visit: Payer: PPO

## 2022-12-14 NOTE — Telephone Encounter (Signed)
Spoke to [place] @ [place] [facility] to make her aware that he needs to be here at 8 am tomorrow for blood transfusion.  She will arrange transport.

## 2022-12-15 ENCOUNTER — Inpatient Hospital Stay: Payer: PPO

## 2022-12-15 ENCOUNTER — Other Ambulatory Visit: Payer: Self-pay | Admitting: Physician Assistant

## 2022-12-15 VITALS — BP 133/77 | HR 55 | Temp 97.9°F | Resp 18

## 2022-12-15 VITALS — BP 87/59 | HR 78 | Temp 97.4°F | Resp 18

## 2022-12-15 DIAGNOSIS — D4622 Refractory anemia with excess of blasts 2: Secondary | ICD-10-CM | POA: Diagnosis not present

## 2022-12-15 DIAGNOSIS — D649 Anemia, unspecified: Secondary | ICD-10-CM

## 2022-12-15 DIAGNOSIS — I10 Essential (primary) hypertension: Secondary | ICD-10-CM | POA: Diagnosis not present

## 2022-12-15 DIAGNOSIS — Z95828 Presence of other vascular implants and grafts: Secondary | ICD-10-CM

## 2022-12-15 DIAGNOSIS — R531 Weakness: Secondary | ICD-10-CM | POA: Diagnosis not present

## 2022-12-15 DIAGNOSIS — D469 Myelodysplastic syndrome, unspecified: Secondary | ICD-10-CM

## 2022-12-15 DIAGNOSIS — D709 Neutropenia, unspecified: Secondary | ICD-10-CM | POA: Diagnosis not present

## 2022-12-15 LAB — TYPE AND SCREEN
ABO/RH(D): O POS
Antibody Screen: NEGATIVE
Unit division: 0
Unit division: 0

## 2022-12-15 LAB — BPAM RBC
Blood Product Expiration Date: 202408202359
Blood Product Expiration Date: 202408302359
ISSUE DATE / TIME: 202407260958
ISSUE DATE / TIME: 202407261137
Unit Type and Rh: 5100
Unit Type and Rh: 5100

## 2022-12-15 LAB — CBC WITH DIFFERENTIAL/PLATELET
Abs Immature Granulocytes: 0.01 10*3/uL (ref 0.00–0.07)
Basophils Absolute: 0 10*3/uL (ref 0.0–0.1)
Basophils Relative: 0 %
Eosinophils Absolute: 0 10*3/uL (ref 0.0–0.5)
Eosinophils Relative: 2 %
HCT: 20.4 % — ABNORMAL LOW (ref 39.0–52.0)
Hemoglobin: 6.6 g/dL — CL (ref 13.0–17.0)
Immature Granulocytes: 1 %
Lymphocytes Relative: 37 %
Lymphs Abs: 0.7 10*3/uL (ref 0.7–4.0)
MCH: 32.7 pg (ref 26.0–34.0)
MCHC: 32.4 g/dL (ref 30.0–36.0)
MCV: 101 fL — ABNORMAL HIGH (ref 80.0–100.0)
Monocytes Absolute: 0.2 10*3/uL (ref 0.1–1.0)
Monocytes Relative: 9 %
Neutro Abs: 1 10*3/uL — ABNORMAL LOW (ref 1.7–7.7)
Neutrophils Relative %: 51 %
Platelets: 174 10*3/uL (ref 150–400)
RBC: 2.02 MIL/uL — ABNORMAL LOW (ref 4.22–5.81)
RDW: 20.2 % — ABNORMAL HIGH (ref 11.5–15.5)
WBC: 2 10*3/uL — ABNORMAL LOW (ref 4.0–10.5)
nRBC: 0 % (ref 0.0–0.2)

## 2022-12-15 LAB — PREPARE RBC (CROSSMATCH)

## 2022-12-15 MED ORDER — DIPHENHYDRAMINE HCL 25 MG PO CAPS
25.0000 mg | ORAL_CAPSULE | Freq: Once | ORAL | Status: DC
  Administered 2022-12-15: 25 mg via ORAL
  Filled 2022-12-15: qty 1

## 2022-12-15 MED ORDER — HEPARIN SOD (PORK) LOCK FLUSH 100 UNIT/ML IV SOLN
500.0000 [IU] | Freq: Every day | INTRAVENOUS | Status: AC | PRN
  Administered 2022-12-15: 500 [IU]

## 2022-12-15 MED ORDER — ACETAMINOPHEN 325 MG PO TABS
650.0000 mg | ORAL_TABLET | Freq: Once | ORAL | Status: DC
  Administered 2022-12-15: 650 mg via ORAL
  Filled 2022-12-15: qty 2

## 2022-12-15 MED ORDER — SODIUM CHLORIDE 0.9% FLUSH
10.0000 mL | INTRAVENOUS | Status: AC | PRN
  Administered 2022-12-15: 10 mL

## 2022-12-15 MED ORDER — SODIUM CHLORIDE 0.9% IV SOLUTION
250.0000 mL | Freq: Once | INTRAVENOUS | Status: DC
  Administered 2022-12-15: 250 mL via INTRAVENOUS

## 2022-12-15 MED ORDER — SODIUM CHLORIDE 0.9% FLUSH
10.0000 mL | INTRAVENOUS | Status: DC | PRN
  Administered 2022-12-15: 10 mL via INTRAVENOUS

## 2022-12-15 NOTE — Progress Notes (Signed)
Patient presents today for 2 units of PRBC.  Patient is in satisfactory condition with no new complaints voiced.  Vital signs are stable.  Hemoglobin today is 6.6.  We will proceed with transfusion per MD orders.   RN noted a very small open area above the R chest port that is black in color.  RN also noted subdermal sutures protruding through patient's skin.  Patient c/o sensitivity around the area when cleaning.  No redness or warmth noted.  Rojelio Brenner, PA-C made aware.  She will evaluate the area before patient leaves today.   Rojelio Brenner, PA-C evaluated patient's port.  Patient was advised to monitor the area for signs of infection, but no further action needed at this time.   Patient tolerated transfusions well with no complaints voiced.  Patient left via wheelchair with [facility] staff member Dondra Prader) in stable condition.  Vital signs stable at discharge.  Follow up as scheduled.

## 2022-12-15 NOTE — Progress Notes (Signed)
CRITICAL VALUE ALERT Critical value received:  hgb 6.6 Date of notification:  12-15-22 Time of notification: 0925 Critical value read back:  Yes.   Nurse who received alert:  C. Lakiyah Arntson RN MD notified time and response:  [phone], Reb. Pennington PA-C

## 2022-12-15 NOTE — Patient Instructions (Signed)

## 2022-12-15 NOTE — Progress Notes (Signed)
Patients port flushed without difficulty.  Good blood return noted with no bruising or swelling noted at site.  Patient remains accessed for transfusion.

## 2022-12-18 DIAGNOSIS — R239 Unspecified skin changes: Secondary | ICD-10-CM | POA: Diagnosis not present

## 2022-12-18 DIAGNOSIS — N400 Enlarged prostate without lower urinary tract symptoms: Secondary | ICD-10-CM | POA: Diagnosis not present

## 2022-12-18 DIAGNOSIS — L89893 Pressure ulcer of other site, stage 3: Secondary | ICD-10-CM | POA: Diagnosis not present

## 2022-12-18 DIAGNOSIS — D508 Other iron deficiency anemias: Secondary | ICD-10-CM | POA: Diagnosis not present

## 2022-12-18 DIAGNOSIS — N3946 Mixed incontinence: Secondary | ICD-10-CM | POA: Diagnosis not present

## 2022-12-18 DIAGNOSIS — F419 Anxiety disorder, unspecified: Secondary | ICD-10-CM | POA: Diagnosis not present

## 2022-12-18 DIAGNOSIS — M6281 Muscle weakness (generalized): Secondary | ICD-10-CM | POA: Diagnosis not present

## 2022-12-19 ENCOUNTER — Other Ambulatory Visit: Payer: Self-pay

## 2022-12-19 DIAGNOSIS — D469 Myelodysplastic syndrome, unspecified: Secondary | ICD-10-CM

## 2022-12-20 DIAGNOSIS — D709 Neutropenia, unspecified: Secondary | ICD-10-CM | POA: Diagnosis not present

## 2022-12-20 DIAGNOSIS — I10 Essential (primary) hypertension: Secondary | ICD-10-CM | POA: Diagnosis not present

## 2022-12-20 DIAGNOSIS — R531 Weakness: Secondary | ICD-10-CM | POA: Diagnosis not present

## 2022-12-20 DIAGNOSIS — D649 Anemia, unspecified: Secondary | ICD-10-CM | POA: Diagnosis not present

## 2022-12-21 ENCOUNTER — Other Ambulatory Visit: Payer: Self-pay

## 2022-12-22 DIAGNOSIS — N179 Acute kidney failure, unspecified: Secondary | ICD-10-CM | POA: Diagnosis not present

## 2022-12-22 DIAGNOSIS — R531 Weakness: Secondary | ICD-10-CM | POA: Diagnosis not present

## 2022-12-22 DIAGNOSIS — I10 Essential (primary) hypertension: Secondary | ICD-10-CM | POA: Diagnosis not present

## 2022-12-22 DIAGNOSIS — D709 Neutropenia, unspecified: Secondary | ICD-10-CM | POA: Diagnosis not present

## 2022-12-22 DIAGNOSIS — E039 Hypothyroidism, unspecified: Secondary | ICD-10-CM | POA: Diagnosis not present

## 2022-12-22 DIAGNOSIS — D649 Anemia, unspecified: Secondary | ICD-10-CM | POA: Diagnosis not present

## 2022-12-24 NOTE — Progress Notes (Incomplete)
[facility] [address], [place] [address]    Clinic Day:  12/24/22   Referring physician: Elfredia Nevins, MD  Patient Care Team: Elfredia Nevins, MD as PCP - General (Internal Medicine) Rollene Rotunda, MD as PCP - Cardiology (Cardiology) Doreatha Massed, MD as Medical Oncologist (Hematology)   ASSESSMENT & PLAN:   Assessment: 1.  MDS with EB 2: - Presentation with severe neutropenia, mild thrombocytopenia and anemia - BMBX (08/17/2022): MDS with excess blasts.  Myeloblasts 17% and monocytes associated dyspoietic changes. - Cytogenetics: 46, XY[10].  AML FISH panel normal. - CEBPA negative.  FLT3 ITD/TKD negative.  NPM1 negative.  T p53 negative. - IDH1 negative.  IDH 2 mutation (p.[id number]) positive. - IPSS-R: Score 5.5, high risk - Serum EPO: 186 - NGS: IDH2 mutation present - Cycle 1 of azacitidine 75 mg/m days 1-7 on 10/23/2022.   2.  Social/family history: - He is independent of ADLs and IADLs.  He served in [employer] in the 1970s.  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  Current active smoker, 1 pack/day for 50 years.  [name]. - No family history of leukopenia or malignancies.    Plan: 1.  MDS with EB 2: - He received cycle 1 on 10/23/2022. - He was hospitalized twice, initially for diarrhea, generalized weakness and UTI.  Second time he was weak and had diarrhea and was positive for norovirus.  He went to rehab at [facility] on 11/29/2022.  I have reviewed hospitalization records. - He reports that he is having about 2-3 bowel movements, soft watery per day. - He reports that he is not happy with the care he is being given at the current facility.  He requests multiple times to be transferred from that facility. - I have written the request from the patient in the notes to the nursing home. - We reviewed his labs: White count improved to 2.5 with ANC of 1.4.  Platelet count  normalized at 185.  Hemoglobin is 8.8.  He had significant improvement in blood counts after 1 cycle.  However because of his poor functional status, I did not recommend continuing azacitidine at this time. - His albumin is low at 1.8.  Recommend boost/Ensure high-protein twice daily. - Recommend RTC 3 weeks for follow-up with repeat labs to see if he needs any transfusion.    No orders of the defined types were placed in this encounter.      John Hicks,acting as a Neurosurgeon for Doreatha Massed, MD.,have documented all relevant documentation on the behalf of Doreatha Massed, MD,as directed by  Doreatha Massed, MD while in the presence of Doreatha Massed, MD.  ***    [name]   8/4/20249:44 PM  CHIEF COMPLAINT:   Diagnosis: MDS   Cancer Staging  No matching staging information was found for the patient.    Prior Therapy: none  Current Therapy:  azacitadine   HISTORY OF PRESENT ILLNESS:   Oncology History  [name]  09/06/2022 Initial Diagnosis   MDS (myelodysplastic syndrome)   10/23/2022 -  Chemotherapy   Patient is on Treatment Plan : MYELODYSPLASIA  Azacitidine IV D1-7 q28d        INTERVAL HISTORY:   John Hicks is a 72 y.o. male presenting to clinic today for follow up of MDS. He was last seen by me on 12/04/22.  His appetite level is at ***%. His energy level is  at ***%.   PAST MEDICAL HISTORY:   Past Medical History: Past Medical History:  Diagnosis Date   AAA (abdominal aortic aneurysm) (HCC)    AF (atrial fibrillation) (HCC)    Anxiety    Blood transfusion without reported diagnosis    BPH (benign prostatic hyperplasia)    CHF (congestive heart failure) (HCC)    Chronic kidney disease    stage 3   Depression    Dyspnea    Dysrhythmia    Hypertension    Hypothyroid    Leukemia (HCC)    Myocardial infarction [facility])     Surgical History: Past Surgical History:  Procedure Laterality Date    ABDOMINAL AORTIC ENDOVASCULAR STENT GRAFT N/A 02/03/2019   Procedure: ABDOMINAL AORTIC ENDOVASCULAR STENT GRAFT;  Surgeon: Maeola Harman, MD;  Location: [facility] OR;  Service: Vascular;  Laterality: N/A;   APPENDECTOMY     KNEE SURGERY     PORTACATH PLACEMENT Right 10/12/2022   Procedure: INSERTION PORT-A-CATH;  Surgeon: Lucretia Roers, MD;  Location: [facility] ORS;  Service: General;  Laterality: Right;   ULTRASOUND GUIDANCE FOR VASCULAR ACCESS  02/03/2019   Procedure: Ultrasound Guidance For Vascular Access;  Surgeon: Maeola Harman, MD;  Location: [facility] OR;  Service: Vascular;;    Social History: Social History   Socioeconomic History   Marital status: Single    Spouse name: Not on file   Number of children: Not on file   Years of education: Not on file   Highest education level: Not on file  Occupational History   Occupation: Retired  Tobacco Use   Smoking status: Some Days    Current packs/day: 1.00    Types: Cigarettes    Passive exposure: Current   Smokeless tobacco: Never   Tobacco comments:    ppd  Vaping Use   Vaping status: Never Used  Substance and Sexual Activity   Alcohol use: No   Drug use: Not Currently    Types: Marijuana   Sexual activity: Not Currently  Other Topics Concern   Not on file  Social History Narrative   Pt lives w/ landlord and [place].   Social Determinants of Health   Financial Resource Strain: Not on file  Food Insecurity: No Food Insecurity (11/20/2022)   Hunger Vital Sign    Worried About Running Out of Food in the Last Year: Never true    Ran Out of Food in the Last Year: Never true  Transportation Needs: No Transportation Needs (11/20/2022)   PRAPARE - Administrator, Civil Service (Medical): No    Lack of Transportation (Non-Medical): No  Physical Activity: Not on file  Stress: Not on file  Social Connections: Not on file  Intimate Partner Violence: Not At Risk (11/20/2022)   Humiliation, Afraid, Rape, and  Kick questionnaire    Fear of Current or Ex-Partner: No    Emotionally Abused: No    Physically Abused: No    Sexually Abused: No    Family History: Family History  Problem Relation Age of Onset   Stroke Father 84       went in the bathroom and collapsed   Multiple sclerosis Sister    Psychiatric Illness Mother 36    Current Medications:  Current Outpatient Medications:    acetaminophen (TYLENOL) 325 MG tablet, Take 2 tablets (650 mg total) by mouth every 6 (six) hours as needed for mild pain (or Fever >/= 101)., Disp: , Rfl:    ALPRAZolam (XANAX) 0.5 MG tablet,  Take 1 tablet (0.5 mg total) by mouth 2 (two) times daily as needed for anxiety., Disp: 14 tablet, Rfl: 0   amiodarone (PACERONE) 200 MG tablet, Take 200 mg by mouth daily at 2 PM., Disp: , Rfl: 0   aspirin 81 MG chewable tablet, Chew 81 mg by mouth daily at 2 PM., Disp: , Rfl:    carvedilol (COREG) 3.125 MG tablet, Take 1 tablet (3.125 mg total) by mouth 2 (two) times daily., Disp: , Rfl:    Cholecalciferol (VITAMIN D3) 50 MCG (2000 UT) capsule, Take 2,000 Units by mouth daily at 2 PM. 1400, Disp: , Rfl:    feeding supplement (ENSURE ENLIVE / ENSURE PLUS) LIQD, Take 237 mLs by mouth 2 (two) times daily between meals., Disp: 237 mL, Rfl: 12   folic acid (FOLVITE) 1 MG tablet, Take 1 mg by mouth daily at 2 PM. One daily, Disp: , Rfl:    levothyroxine (SYNTHROID) 25 MCG tablet, Take 25 mcg by mouth daily at 2 PM., Disp: , Rfl:    loperamide (IMODIUM) 2 MG capsule, Take 1 capsule (2 mg total) by mouth every 6 (six) hours as needed for diarrhea or loose stools., Disp: 30 capsule, Rfl: 0   Nutritional Supplements (,FEEDING SUPPLEMENT, PROSOURCE PLUS) liquid, Take 60 mLs by mouth 3 (three) times daily between meals., Disp: , Rfl:    ondansetron (ZOFRAN) 4 MG tablet, Take 1 tablet (4 mg total) by mouth every 8 (eight) hours as needed for vomiting or nausea., Disp: , Rfl:    tamsulosin (FLOMAX) 0.4 MG CAPS capsule, Take 0.4 mg by mouth  daily at 2 PM., Disp: , Rfl:    Allergies: Allergies  Allergen Reactions   Codeine Other (See Comments)    agitation    REVIEW OF SYSTEMS:   Review of Systems  Constitutional:  Negative for chills, fatigue and fever.  HENT:   Negative for lump/mass, mouth sores, nosebleeds, sore throat and trouble swallowing.   Eyes:  Negative for eye problems.  Respiratory:  Negative for cough and shortness of breath.   Cardiovascular:  Negative for chest pain, leg swelling and palpitations.  Gastrointestinal:  Negative for abdominal pain, constipation, diarrhea, nausea and vomiting.  Genitourinary:  Negative for bladder incontinence, difficulty urinating, dysuria, frequency, hematuria and nocturia.   Musculoskeletal:  Negative for arthralgias, back pain, flank pain, myalgias and neck pain.  Skin:  Negative for itching and rash.  Neurological:  Negative for dizziness, headaches and numbness.  Hematological:  Does not bruise/bleed easily.  Psychiatric/Behavioral:  Negative for depression, sleep disturbance and suicidal ideas. The patient is not nervous/anxious.   All other systems reviewed and are negative.    VITALS:   There were no vitals taken for this visit.  Wt Readings from Last 3 Encounters:  11/20/22 180 lb (81.6 kg)  11/06/22 185 lb (83.9 kg)  10/30/22 186 lb 3.2 oz (84.5 kg)    There is no height or weight on file to calculate BMI.  Performance status (ECOG): 1 - Symptomatic but completely ambulatory  PHYSICAL EXAM:   Physical Exam Vitals and nursing note reviewed. Exam conducted with a chaperone present.  Constitutional:      Appearance: Normal appearance.  Cardiovascular:     Rate and Rhythm: Normal rate and regular rhythm.     Pulses: Normal pulses.     Heart sounds: Normal heart sounds.  Pulmonary:     Effort: Pulmonary effort is normal.     Breath sounds: Normal breath sounds.  Abdominal:  Palpations: Abdomen is soft. There is no hepatomegaly, splenomegaly or  mass.     Tenderness: There is no abdominal tenderness.  Musculoskeletal:     Right lower leg: No edema.     Left lower leg: No edema.  Lymphadenopathy:     Cervical: No cervical adenopathy.     Right cervical: No superficial, deep or posterior cervical adenopathy.    Left cervical: No superficial, deep or posterior cervical adenopathy.     Upper Body:     Right upper body: No supraclavicular or axillary adenopathy.     Left upper body: No supraclavicular or axillary adenopathy.  Neurological:     General: No focal deficit present.     Mental Status: He is alert and oriented to person, place, and time.  Psychiatric:        Mood and Affect: Mood normal.        Behavior: Behavior normal.     LABS:      Latest Ref Rng & Units 12/15/2022    8:58 AM 12/04/2022    1:21 PM 11/27/2022    4:22 AM  CBC  WBC 4.0 - 10.5 K/uL 2.0  2.5  2.6   Hemoglobin 13.0 - 17.0 g/dL 6.6  8.8  7.3   Hematocrit 39.0 - 52.0 % 20.4  27.0  23.1   Platelets 150 - 400 K/uL 174  185  105       Latest Ref Rng & Units 12/04/2022    1:21 PM 11/23/2022    4:15 AM 11/22/2022    4:24 AM  CMP  Glucose 70 - 99 mg/dL 563  875  643   BUN 8 - 23 mg/dL 18  37  40   Creatinine 0.61 - 1.24 mg/dL 3.29  5.18  8.41   Sodium 135 - 145 mmol/L 133  137  137   Potassium 3.5 - 5.1 mmol/L 3.4  3.4  3.5   Chloride 98 - 111 mmol/L 104  114  114   CO2 22 - 32 mmol/L 22  18  17    Calcium 8.9 - 10.3 mg/dL 7.8  7.8  7.7   Total Protein 6.5 - 8.1 g/dL 5.8     Total Bilirubin 0.3 - 1.2 mg/dL 1.5     Alkaline Phos 38 - 126 U/L 53     AST 15 - 41 U/L 17     ALT 0 - 44 U/L 14        No results found for: "CEA1", "CEA" / No results found for: "CEA1", "CEA" No results found for: "PSA1" No results found for: "YSA630" No results found for: "CAN125"  No results found for: "TOTALPROTELP", "ALBUMINELP", "A1GS", "A2GS", "BETS", "BETA2SER", "GAMS", "MSPIKE", "SPEI" Lab Results  Component Value Date   TIBC 339 06/30/2022   FERRITIN 108  06/30/2022   IRONPCTSAT 17 (L) 06/30/2022   Lab Results  Component Value Date   LDH 145 06/30/2022     STUDIES:   DG CHEST PORT 1 VIEW  Result Date: 11/25/2022 CLINICAL DATA:  Fever EXAM: PORTABLE CHEST 1 VIEW COMPARISON:  11/06/2022 FINDINGS: Right-sided chest port remains in place. Stable mild cardiomegaly. Aortic atherosclerosis. Coarsened interstitial markings bilaterally. No lobar consolidation. No pleural effusion or pneumothorax. IMPRESSION: Coarsened interstitial markings bilaterally, which may reflect bronchitic type lung changes. No lobar consolidation. Electronically Signed   By: Duanne Guess D.O.   On: 11/25/2022 12:47

## 2022-12-25 ENCOUNTER — Inpatient Hospital Stay: Payer: PPO | Admitting: Dietician

## 2022-12-25 ENCOUNTER — Inpatient Hospital Stay: Payer: PPO

## 2022-12-25 ENCOUNTER — Encounter: Payer: Self-pay | Admitting: Family Medicine

## 2022-12-25 ENCOUNTER — Inpatient Hospital Stay: Payer: PPO | Admitting: Hematology

## 2022-12-25 DIAGNOSIS — E039 Hypothyroidism, unspecified: Secondary | ICD-10-CM | POA: Diagnosis not present

## 2022-12-25 DIAGNOSIS — R531 Weakness: Secondary | ICD-10-CM | POA: Diagnosis not present

## 2022-12-25 DIAGNOSIS — F419 Anxiety disorder, unspecified: Secondary | ICD-10-CM | POA: Diagnosis not present

## 2022-12-25 DIAGNOSIS — L89893 Pressure ulcer of other site, stage 3: Secondary | ICD-10-CM | POA: Diagnosis not present

## 2022-12-25 DIAGNOSIS — D508 Other iron deficiency anemias: Secondary | ICD-10-CM | POA: Diagnosis not present

## 2022-12-25 DIAGNOSIS — N179 Acute kidney failure, unspecified: Secondary | ICD-10-CM | POA: Diagnosis not present

## 2022-12-25 DIAGNOSIS — I10 Essential (primary) hypertension: Secondary | ICD-10-CM | POA: Diagnosis not present

## 2022-12-25 DIAGNOSIS — D709 Neutropenia, unspecified: Secondary | ICD-10-CM | POA: Diagnosis not present

## 2022-12-25 DIAGNOSIS — M6281 Muscle weakness (generalized): Secondary | ICD-10-CM | POA: Diagnosis not present

## 2022-12-25 DIAGNOSIS — R239 Unspecified skin changes: Secondary | ICD-10-CM | POA: Diagnosis not present

## 2022-12-25 DIAGNOSIS — N3946 Mixed incontinence: Secondary | ICD-10-CM | POA: Diagnosis not present

## 2022-12-25 DIAGNOSIS — D649 Anemia, unspecified: Secondary | ICD-10-CM | POA: Diagnosis not present

## 2022-12-25 DIAGNOSIS — N400 Enlarged prostate without lower urinary tract symptoms: Secondary | ICD-10-CM | POA: Diagnosis not present

## 2022-12-25 DIAGNOSIS — D469 Myelodysplastic syndrome, unspecified: Secondary | ICD-10-CM

## 2022-12-26 ENCOUNTER — Inpatient Hospital Stay: Payer: PPO

## 2022-12-27 ENCOUNTER — Inpatient Hospital Stay: Payer: PPO

## 2022-12-27 DIAGNOSIS — L03213 Periorbital cellulitis: Secondary | ICD-10-CM | POA: Diagnosis not present

## 2022-12-28 ENCOUNTER — Inpatient Hospital Stay: Payer: PPO

## 2022-12-29 ENCOUNTER — Inpatient Hospital Stay: Payer: PPO

## 2023-01-01 ENCOUNTER — Inpatient Hospital Stay: Payer: PPO

## 2023-01-01 DIAGNOSIS — F419 Anxiety disorder, unspecified: Secondary | ICD-10-CM | POA: Diagnosis not present

## 2023-01-01 DIAGNOSIS — N3946 Mixed incontinence: Secondary | ICD-10-CM | POA: Diagnosis not present

## 2023-01-01 DIAGNOSIS — N400 Enlarged prostate without lower urinary tract symptoms: Secondary | ICD-10-CM | POA: Diagnosis not present

## 2023-01-01 DIAGNOSIS — R239 Unspecified skin changes: Secondary | ICD-10-CM | POA: Diagnosis not present

## 2023-01-01 DIAGNOSIS — M6281 Muscle weakness (generalized): Secondary | ICD-10-CM | POA: Diagnosis not present

## 2023-01-01 DIAGNOSIS — L89893 Pressure ulcer of other site, stage 3: Secondary | ICD-10-CM | POA: Diagnosis not present

## 2023-01-01 DIAGNOSIS — D508 Other iron deficiency anemias: Secondary | ICD-10-CM | POA: Diagnosis not present

## 2023-01-02 ENCOUNTER — Inpatient Hospital Stay: Payer: PPO

## 2023-01-03 DIAGNOSIS — N179 Acute kidney failure, unspecified: Secondary | ICD-10-CM | POA: Diagnosis not present

## 2023-01-03 DIAGNOSIS — D709 Neutropenia, unspecified: Secondary | ICD-10-CM | POA: Diagnosis not present

## 2023-01-03 DIAGNOSIS — E039 Hypothyroidism, unspecified: Secondary | ICD-10-CM | POA: Diagnosis not present

## 2023-01-03 DIAGNOSIS — I10 Essential (primary) hypertension: Secondary | ICD-10-CM | POA: Diagnosis not present

## 2023-01-03 DIAGNOSIS — D649 Anemia, unspecified: Secondary | ICD-10-CM | POA: Diagnosis not present

## 2023-01-03 DIAGNOSIS — R531 Weakness: Secondary | ICD-10-CM | POA: Diagnosis not present

## 2023-01-04 DIAGNOSIS — T50901A Poisoning by unspecified drugs, medicaments and biological substances, accidental (unintentional), initial encounter: Secondary | ICD-10-CM | POA: Diagnosis not present

## 2023-01-08 DIAGNOSIS — I10 Essential (primary) hypertension: Secondary | ICD-10-CM | POA: Diagnosis not present

## 2023-01-08 DIAGNOSIS — N179 Acute kidney failure, unspecified: Secondary | ICD-10-CM | POA: Diagnosis not present

## 2023-01-08 DIAGNOSIS — E039 Hypothyroidism, unspecified: Secondary | ICD-10-CM | POA: Diagnosis not present

## 2023-01-08 DIAGNOSIS — F411 Generalized anxiety disorder: Secondary | ICD-10-CM | POA: Diagnosis not present

## 2023-01-08 DIAGNOSIS — D649 Anemia, unspecified: Secondary | ICD-10-CM | POA: Diagnosis not present

## 2023-01-08 DIAGNOSIS — D709 Neutropenia, unspecified: Secondary | ICD-10-CM | POA: Diagnosis not present

## 2023-01-08 DIAGNOSIS — R531 Weakness: Secondary | ICD-10-CM | POA: Diagnosis not present

## 2023-01-10 DIAGNOSIS — N179 Acute kidney failure, unspecified: Secondary | ICD-10-CM | POA: Diagnosis not present

## 2023-01-10 DIAGNOSIS — R531 Weakness: Secondary | ICD-10-CM | POA: Diagnosis not present

## 2023-01-10 DIAGNOSIS — F411 Generalized anxiety disorder: Secondary | ICD-10-CM | POA: Diagnosis not present

## 2023-01-10 DIAGNOSIS — D709 Neutropenia, unspecified: Secondary | ICD-10-CM | POA: Diagnosis not present

## 2023-01-10 DIAGNOSIS — D649 Anemia, unspecified: Secondary | ICD-10-CM | POA: Diagnosis not present

## 2023-01-10 DIAGNOSIS — E039 Hypothyroidism, unspecified: Secondary | ICD-10-CM | POA: Diagnosis not present

## 2023-01-10 DIAGNOSIS — I10 Essential (primary) hypertension: Secondary | ICD-10-CM | POA: Diagnosis not present

## 2023-01-15 DIAGNOSIS — M19071 Primary osteoarthritis, right ankle and foot: Secondary | ICD-10-CM | POA: Diagnosis not present

## 2023-01-15 DIAGNOSIS — I482 Chronic atrial fibrillation, unspecified: Secondary | ICD-10-CM | POA: Diagnosis not present

## 2023-01-15 DIAGNOSIS — E039 Hypothyroidism, unspecified: Secondary | ICD-10-CM | POA: Diagnosis not present

## 2023-01-15 DIAGNOSIS — L89893 Pressure ulcer of other site, stage 3: Secondary | ICD-10-CM | POA: Diagnosis not present

## 2023-01-15 DIAGNOSIS — I252 Old myocardial infarction: Secondary | ICD-10-CM | POA: Diagnosis not present

## 2023-01-15 DIAGNOSIS — N179 Acute kidney failure, unspecified: Secondary | ICD-10-CM | POA: Diagnosis not present

## 2023-01-15 DIAGNOSIS — Z9181 History of falling: Secondary | ICD-10-CM | POA: Diagnosis not present

## 2023-01-15 DIAGNOSIS — D469 Myelodysplastic syndrome, unspecified: Secondary | ICD-10-CM | POA: Diagnosis not present

## 2023-01-15 DIAGNOSIS — D631 Anemia in chronic kidney disease: Secondary | ICD-10-CM | POA: Diagnosis not present

## 2023-01-15 DIAGNOSIS — Z87891 Personal history of nicotine dependence: Secondary | ICD-10-CM | POA: Diagnosis not present

## 2023-01-15 DIAGNOSIS — Z7982 Long term (current) use of aspirin: Secondary | ICD-10-CM | POA: Diagnosis not present

## 2023-01-15 DIAGNOSIS — D696 Thrombocytopenia, unspecified: Secondary | ICD-10-CM | POA: Diagnosis not present

## 2023-01-15 DIAGNOSIS — F32A Depression, unspecified: Secondary | ICD-10-CM | POA: Diagnosis not present

## 2023-01-15 DIAGNOSIS — I509 Heart failure, unspecified: Secondary | ICD-10-CM | POA: Diagnosis not present

## 2023-01-15 DIAGNOSIS — D709 Neutropenia, unspecified: Secondary | ICD-10-CM | POA: Diagnosis not present

## 2023-01-15 DIAGNOSIS — I130 Hypertensive heart and chronic kidney disease with heart failure and stage 1 through stage 4 chronic kidney disease, or unspecified chronic kidney disease: Secondary | ICD-10-CM | POA: Diagnosis not present

## 2023-01-15 DIAGNOSIS — D630 Anemia in neoplastic disease: Secondary | ICD-10-CM | POA: Diagnosis not present

## 2023-01-15 DIAGNOSIS — N400 Enlarged prostate without lower urinary tract symptoms: Secondary | ICD-10-CM | POA: Diagnosis not present

## 2023-01-15 DIAGNOSIS — F419 Anxiety disorder, unspecified: Secondary | ICD-10-CM | POA: Diagnosis not present

## 2023-01-15 DIAGNOSIS — I2510 Atherosclerotic heart disease of native coronary artery without angina pectoris: Secondary | ICD-10-CM | POA: Diagnosis not present

## 2023-01-15 DIAGNOSIS — N189 Chronic kidney disease, unspecified: Secondary | ICD-10-CM | POA: Diagnosis not present

## 2023-01-16 DIAGNOSIS — E440 Moderate protein-calorie malnutrition: Secondary | ICD-10-CM | POA: Diagnosis not present

## 2023-01-16 DIAGNOSIS — N183 Chronic kidney disease, stage 3 unspecified: Secondary | ICD-10-CM | POA: Diagnosis not present

## 2023-01-16 DIAGNOSIS — E46 Unspecified protein-calorie malnutrition: Secondary | ICD-10-CM | POA: Diagnosis not present

## 2023-01-16 DIAGNOSIS — E860 Dehydration: Secondary | ICD-10-CM | POA: Diagnosis not present

## 2023-01-16 DIAGNOSIS — Z6820 Body mass index (BMI) 20.0-20.9, adult: Secondary | ICD-10-CM | POA: Diagnosis not present

## 2023-01-16 DIAGNOSIS — N400 Enlarged prostate without lower urinary tract symptoms: Secondary | ICD-10-CM | POA: Diagnosis not present

## 2023-01-23 ENCOUNTER — Ambulatory Visit: Payer: PPO | Admitting: Nurse Practitioner

## 2023-01-23 DIAGNOSIS — L89153 Pressure ulcer of sacral region, stage 3: Secondary | ICD-10-CM | POA: Diagnosis not present

## 2023-01-24 ENCOUNTER — Encounter: Payer: Self-pay | Admitting: Hematology

## 2023-01-25 ENCOUNTER — Ambulatory Visit: Admitting: General Surgery

## 2023-01-25 ENCOUNTER — Encounter: Payer: Self-pay | Admitting: General Surgery

## 2023-01-25 VITALS — BP 144/76 | HR 73 | Temp 97.3°F | Resp 16 | Ht 72.0 in | Wt 160.0 lb

## 2023-01-25 DIAGNOSIS — D469 Myelodysplastic syndrome, unspecified: Secondary | ICD-10-CM

## 2023-01-25 DIAGNOSIS — T80212A Local infection due to central venous catheter, initial encounter: Secondary | ICD-10-CM | POA: Diagnosis not present

## 2023-01-25 MED ORDER — DOXYCYCLINE HYCLATE 50 MG PO CAPS
50.0000 mg | ORAL_CAPSULE | Freq: Two times a day (BID) | ORAL | 0 refills | Status: AC
Start: 2023-01-25 — End: 2023-02-01

## 2023-01-25 NOTE — Patient Instructions (Addendum)
You may shower but do not get the upper right chest/ wound wet. Keep this area covered and replace bandage after showering. Pack the right chest wound daily with saline dampened gauze (2X2) and cover with dry gauze and papertape.  Monitor the skin around the open wound to ensure the redness is not spreading out. You have some redness on the neck around the right neck veins. Monitor this for any worsening or spreading infection.  I have marked the area but I think you have been scratching the area based on your scratching when you were in the office.  Monitor for signs of blood stream infection including fevers, chills, low blood pressures.  Take your antibiotic as prescribed.

## 2023-01-25 NOTE — Progress Notes (Signed)
[facility] Procedure Note  01/25/23  Pre-procedure Diagnosis: Infected port right chest wall, eroded through skin    Post-procedure Diagnosis: Same   Procedure(s) Performed: Right chest port a catheter removal    Surgeon: Leatrice Jewels. Henreitta Leber, MD   Assistants: No qualified resident was available    Anesthesia: Lidocaine 1%    Specimens:  None    Estimated Blood Loss: Minimal  Wound Class: Dirty Infected    Procedure Indications: John Hicks is a 72 yo who has Myelodysplastic syndrome and is unable to tolerate chemotherapy. He is now at hospice house and has been noted to have an eroded port and concern for skin infection.  We discussed removal and risk of bleeding, worsening infection, pneumothorax.   Findings: Open port cavity, sutures removed and port removed in its entirety      Procedure: The patient was taken to the procedure room and placed in the sitting position. The right chest was prepared and draped in the usual sterile fashion. Lidocaine 1% was injected around the cavity and port.   The suture remaining was clipped. The port was gently removed with blunt dissection. The cavity had no obvious purulence. The port and catheter were removed in its entirety. Pressure was held on the entry at the internal jugular for 10 minutes.  Inside the cavity where the catheter entered the subcutaneous tissue, I closed the opening with a figure of 8 3-0 Vicryl suture. The cavity was irrigated.  A 2X2 gauze dampend with saline was placed into the wound and covered with dry gauze and paper tape.   Final inspection revealed acceptable hemostasis. The patient tolerated the procedure well. There was no signs of swelling in the right neck after the procedure and after the pressure was held.   Instructions:  You may shower but do not get the upper right chest/ wound wet. Keep this area covered and replace bandage after showering. Pack the right chest wound daily with saline  dampened gauze (2X2) and cover with dry gauze and papertape.  Monitor the skin around the open wound to ensure the redness is not spreading out. You have some redness on the neck around the right neck veins. Monitor this for any worsening or spreading infection.    I have marked the area but I think you have been scratching the area based on your scratching when you were in the office.  Monitor for signs of blood stream infection including fevers, chills, low blood pressures.  Take your antibiotic as prescribed.   The patient does not want to be hospitalized for IV antibiotics even if the redness is worsening. He states he is going back to hospice house.    John Greenhouse, MD [facility] [address] [place], [place] [address] [phone] (office)

## 2023-01-26 DIAGNOSIS — D630 Anemia in neoplastic disease: Secondary | ICD-10-CM | POA: Diagnosis not present

## 2023-01-26 DIAGNOSIS — N179 Acute kidney failure, unspecified: Secondary | ICD-10-CM | POA: Diagnosis not present

## 2023-01-26 DIAGNOSIS — D469 Myelodysplastic syndrome, unspecified: Secondary | ICD-10-CM | POA: Diagnosis not present

## 2023-01-26 DIAGNOSIS — L89893 Pressure ulcer of other site, stage 3: Secondary | ICD-10-CM | POA: Diagnosis not present

## 2023-01-29 ENCOUNTER — Inpatient Hospital Stay: Payer: PPO

## 2023-01-29 ENCOUNTER — Inpatient Hospital Stay: Payer: PPO | Admitting: Hematology

## 2023-01-30 ENCOUNTER — Inpatient Hospital Stay: Payer: PPO

## 2023-01-31 ENCOUNTER — Inpatient Hospital Stay: Payer: PPO

## 2023-02-01 ENCOUNTER — Inpatient Hospital Stay: Payer: PPO

## 2023-02-02 ENCOUNTER — Inpatient Hospital Stay: Payer: PPO

## 2023-02-05 ENCOUNTER — Other Ambulatory Visit: Payer: PPO

## 2023-02-05 ENCOUNTER — Ambulatory Visit: Payer: PPO

## 2023-02-05 ENCOUNTER — Inpatient Hospital Stay: Payer: PPO

## 2023-02-06 ENCOUNTER — Inpatient Hospital Stay: Payer: PPO

## 2023-03-28 ENCOUNTER — Ambulatory Visit: Payer: PPO | Admitting: Cardiology

## 2023-06-25 NOTE — Progress Notes (Signed)
[facility] referral closed. Patient enrolled in hospice care per [facility]

## 2024-01-23 ENCOUNTER — Other Ambulatory Visit: Payer: Self-pay | Admitting: *Deleted

## 2024-03-22 DEATH — deceased
